# Patient Record
Sex: Male | Born: 1945
Health system: Southern US, Community
[De-identification: ages and names within clinical notes are randomized; demographics above are authoritative.]

## PROBLEM LIST (undated history)

## (undated) DIAGNOSIS — M797 Fibromyalgia: Secondary | ICD-10-CM

## (undated) DIAGNOSIS — G252 Other specified forms of tremor: Secondary | ICD-10-CM

## (undated) DIAGNOSIS — E291 Testicular hypofunction: Secondary | ICD-10-CM

## (undated) DIAGNOSIS — R55 Syncope and collapse: Secondary | ICD-10-CM

## (undated) DIAGNOSIS — M545 Low back pain, unspecified: Secondary | ICD-10-CM

## (undated) DIAGNOSIS — R269 Unspecified abnormalities of gait and mobility: Secondary | ICD-10-CM

## (undated) DIAGNOSIS — K219 Gastro-esophageal reflux disease without esophagitis: Secondary | ICD-10-CM

## (undated) DIAGNOSIS — F419 Anxiety disorder, unspecified: Secondary | ICD-10-CM

## (undated) DIAGNOSIS — G4730 Sleep apnea, unspecified: Secondary | ICD-10-CM

## (undated) DIAGNOSIS — E785 Hyperlipidemia, unspecified: Secondary | ICD-10-CM

## (undated) DIAGNOSIS — R350 Frequency of micturition: Secondary | ICD-10-CM

## (undated) DIAGNOSIS — G20 Parkinson's disease: Secondary | ICD-10-CM

## (undated) DIAGNOSIS — G4710 Hypersomnia, unspecified: Secondary | ICD-10-CM

## (undated) DIAGNOSIS — IMO0002 Reserved for concepts with insufficient information to code with codable children: Secondary | ICD-10-CM

## (undated) DIAGNOSIS — K2990 Gastroduodenitis, unspecified, without bleeding: Secondary | ICD-10-CM

## (undated) DIAGNOSIS — R351 Nocturia: Secondary | ICD-10-CM

## (undated) DIAGNOSIS — M25029 Hemarthrosis, unspecified elbow: Secondary | ICD-10-CM

## (undated) DIAGNOSIS — I10 Essential (primary) hypertension: Secondary | ICD-10-CM

## (undated) DIAGNOSIS — Z800 Family history of malignant neoplasm of digestive organs: Secondary | ICD-10-CM

## (undated) DIAGNOSIS — K2970 Gastritis, unspecified, without bleeding: Secondary | ICD-10-CM

## (undated) DIAGNOSIS — R1031 Right lower quadrant pain: Secondary | ICD-10-CM

## (undated) DIAGNOSIS — M810 Age-related osteoporosis without current pathological fracture: Secondary | ICD-10-CM

## (undated) DIAGNOSIS — M48061 Spinal stenosis, lumbar region without neurogenic claudication: Secondary | ICD-10-CM

## (undated) DIAGNOSIS — K4090 Unilateral inguinal hernia, without obstruction or gangrene, not specified as recurrent: Secondary | ICD-10-CM

## (undated) DIAGNOSIS — F32A Depression, unspecified: Secondary | ICD-10-CM

## (undated) DIAGNOSIS — T887XXA Unspecified adverse effect of drug or medicament, initial encounter: Secondary | ICD-10-CM

## (undated) DIAGNOSIS — E876 Hypokalemia: Secondary | ICD-10-CM

## (undated) DIAGNOSIS — R413 Other amnesia: Secondary | ICD-10-CM

## (undated) DIAGNOSIS — F329 Major depressive disorder, single episode, unspecified: Secondary | ICD-10-CM

## (undated) DIAGNOSIS — K5730 Diverticulosis of large intestine without perforation or abscess without bleeding: Secondary | ICD-10-CM

## (undated) DIAGNOSIS — M1710 Unilateral primary osteoarthritis, unspecified knee: Secondary | ICD-10-CM

## (undated) DIAGNOSIS — G250 Essential tremor: Secondary | ICD-10-CM

## (undated) DIAGNOSIS — N410 Acute prostatitis: Secondary | ICD-10-CM

## (undated) HISTORY — DX: Low back pain: M54.5

## (undated) HISTORY — DX: Frequency of micturition: R35.0

## (undated) HISTORY — DX: Hypersomnia, unspecified: G47.10

## (undated) HISTORY — DX: Family history of malignant neoplasm of digestive organs: Z80.0

## (undated) HISTORY — DX: Low back pain, unspecified: M54.50

## (undated) HISTORY — DX: Other amnesia: R41.3

## (undated) HISTORY — DX: Essential tremor: G25.0

## (undated) HISTORY — DX: Hemarthrosis, unspecified elbow: M25.029

## (undated) HISTORY — DX: Essential tremor: G25.2

## (undated) HISTORY — PX: HERNIA REPAIR: SHX51

## (undated) HISTORY — PX: TONSILLECTOMY: SUR1361

## (undated) HISTORY — DX: Fibromyalgia: M79.7

## (undated) HISTORY — DX: Reserved for concepts with insufficient information to code with codable children: IMO0002

## (undated) HISTORY — DX: Syncope and collapse: R55

## (undated) HISTORY — DX: Spinal stenosis, lumbar region without neurogenic claudication: M48.061

## (undated) HISTORY — DX: Sleep apnea, unspecified: G47.30

## (undated) HISTORY — PX: BACK SURGERY: SHX140

## (undated) HISTORY — DX: Gastritis, unspecified, without bleeding: K29.70

## (undated) HISTORY — DX: Unspecified abnormalities of gait and mobility: R26.9

## (undated) HISTORY — DX: Hyperlipidemia, unspecified: E78.5

## (undated) HISTORY — DX: Gastroduodenitis, unspecified, without bleeding: K29.90

## (undated) HISTORY — DX: Testicular hypofunction: E29.1

## (undated) HISTORY — DX: Age-related osteoporosis without current pathological fracture: M81.0

## (undated) HISTORY — DX: Right lower quadrant pain: R10.31

## (undated) HISTORY — PX: OTHER SURGICAL HISTORY: SHX169

## (undated) HISTORY — DX: Essential (primary) hypertension: I10

## (undated) HISTORY — DX: Gastro-esophageal reflux disease without esophagitis: K21.9

## (undated) HISTORY — DX: Hypokalemia: E87.6

## (undated) HISTORY — DX: Parkinson's disease: G20

## (undated) HISTORY — DX: Unilateral inguinal hernia, without obstruction or gangrene, not specified as recurrent: K40.90

## (undated) HISTORY — DX: Unspecified adverse effect of drug or medicament, initial encounter: T88.7XXA

## (undated) HISTORY — DX: Nocturia: R35.1

## (undated) HISTORY — DX: Diverticulosis of large intestine without perforation or abscess without bleeding: K57.30

## (undated) HISTORY — DX: Acute prostatitis: N41.0

## (undated) HISTORY — DX: Unilateral primary osteoarthritis, unspecified knee: M17.10

---

## 2000-10-29 ENCOUNTER — Encounter: Payer: Self-pay | Admitting: Urology

## 2000-10-29 ENCOUNTER — Encounter: Admission: RE | Admit: 2000-10-29 | Discharge: 2000-10-29 | Payer: Self-pay | Admitting: Urology

## 2000-11-06 ENCOUNTER — Encounter: Payer: Self-pay | Admitting: Urology

## 2000-11-07 ENCOUNTER — Encounter (INDEPENDENT_AMBULATORY_CARE_PROVIDER_SITE_OTHER): Payer: Self-pay | Admitting: Specialist

## 2000-11-07 ENCOUNTER — Ambulatory Visit (HOSPITAL_COMMUNITY): Admission: RE | Admit: 2000-11-07 | Discharge: 2000-11-07 | Payer: Self-pay | Admitting: Urology

## 2001-03-15 ENCOUNTER — Encounter: Payer: Self-pay | Admitting: Emergency Medicine

## 2001-03-16 ENCOUNTER — Inpatient Hospital Stay (HOSPITAL_COMMUNITY): Admission: EM | Admit: 2001-03-16 | Discharge: 2001-03-16 | Payer: Self-pay | Admitting: Emergency Medicine

## 2002-04-30 ENCOUNTER — Encounter: Payer: Self-pay | Admitting: Internal Medicine

## 2002-04-30 ENCOUNTER — Encounter: Admission: RE | Admit: 2002-04-30 | Discharge: 2002-04-30 | Payer: Self-pay | Admitting: Internal Medicine

## 2003-02-25 ENCOUNTER — Encounter: Payer: Self-pay | Admitting: Orthopaedic Surgery

## 2003-03-02 ENCOUNTER — Ambulatory Visit (HOSPITAL_COMMUNITY): Admission: RE | Admit: 2003-03-02 | Discharge: 2003-03-03 | Payer: Self-pay | Admitting: Orthopaedic Surgery

## 2003-03-02 ENCOUNTER — Encounter: Payer: Self-pay | Admitting: Orthopaedic Surgery

## 2003-06-29 ENCOUNTER — Ambulatory Visit (HOSPITAL_COMMUNITY): Admission: RE | Admit: 2003-06-29 | Discharge: 2003-06-29 | Payer: Self-pay | Admitting: Orthopaedic Surgery

## 2005-04-18 ENCOUNTER — Ambulatory Visit: Payer: Self-pay | Admitting: Internal Medicine

## 2005-04-25 ENCOUNTER — Ambulatory Visit: Payer: Self-pay | Admitting: Internal Medicine

## 2005-04-26 ENCOUNTER — Ambulatory Visit: Payer: Self-pay | Admitting: Internal Medicine

## 2005-06-27 ENCOUNTER — Ambulatory Visit: Payer: Self-pay | Admitting: Internal Medicine

## 2005-06-28 ENCOUNTER — Encounter: Admission: RE | Admit: 2005-06-28 | Discharge: 2005-06-28 | Payer: Self-pay | Admitting: Internal Medicine

## 2005-08-02 ENCOUNTER — Encounter: Admission: RE | Admit: 2005-08-02 | Discharge: 2005-08-02 | Payer: Self-pay | Admitting: Specialist

## 2005-11-18 ENCOUNTER — Ambulatory Visit: Payer: Self-pay | Admitting: Internal Medicine

## 2005-12-09 ENCOUNTER — Ambulatory Visit: Payer: Self-pay | Admitting: Internal Medicine

## 2006-02-25 ENCOUNTER — Ambulatory Visit: Payer: Self-pay | Admitting: Internal Medicine

## 2006-03-23 ENCOUNTER — Encounter: Admission: RE | Admit: 2006-03-23 | Discharge: 2006-03-23 | Payer: Self-pay | Admitting: Specialist

## 2006-04-03 ENCOUNTER — Ambulatory Visit: Payer: Self-pay | Admitting: Internal Medicine

## 2006-04-17 ENCOUNTER — Ambulatory Visit: Payer: Self-pay | Admitting: Internal Medicine

## 2006-05-16 ENCOUNTER — Ambulatory Visit: Payer: Self-pay | Admitting: Internal Medicine

## 2006-05-21 ENCOUNTER — Ambulatory Visit: Payer: Self-pay | Admitting: Internal Medicine

## 2006-07-21 ENCOUNTER — Ambulatory Visit: Payer: Self-pay | Admitting: Internal Medicine

## 2006-08-21 ENCOUNTER — Ambulatory Visit: Payer: Self-pay | Admitting: Internal Medicine

## 2006-10-23 ENCOUNTER — Ambulatory Visit: Payer: Self-pay | Admitting: Internal Medicine

## 2006-12-25 ENCOUNTER — Ambulatory Visit: Payer: Self-pay | Admitting: Internal Medicine

## 2007-02-09 ENCOUNTER — Ambulatory Visit: Payer: Self-pay | Admitting: Gastroenterology

## 2007-02-12 ENCOUNTER — Ambulatory Visit: Payer: Self-pay | Admitting: Gastroenterology

## 2007-02-12 DIAGNOSIS — K2990 Gastroduodenitis, unspecified, without bleeding: Secondary | ICD-10-CM

## 2007-02-12 DIAGNOSIS — K219 Gastro-esophageal reflux disease without esophagitis: Secondary | ICD-10-CM

## 2007-02-12 DIAGNOSIS — I10 Essential (primary) hypertension: Secondary | ICD-10-CM

## 2007-02-12 DIAGNOSIS — K2970 Gastritis, unspecified, without bleeding: Secondary | ICD-10-CM | POA: Insufficient documentation

## 2007-02-13 ENCOUNTER — Encounter: Payer: Self-pay | Admitting: Gastroenterology

## 2007-02-13 ENCOUNTER — Ambulatory Visit: Payer: Self-pay | Admitting: Gastroenterology

## 2007-02-23 ENCOUNTER — Ambulatory Visit: Payer: Self-pay | Admitting: Gastroenterology

## 2007-02-23 DIAGNOSIS — K5730 Diverticulosis of large intestine without perforation or abscess without bleeding: Secondary | ICD-10-CM | POA: Insufficient documentation

## 2007-03-03 ENCOUNTER — Ambulatory Visit: Payer: Self-pay | Admitting: Internal Medicine

## 2007-03-30 ENCOUNTER — Ambulatory Visit: Payer: Self-pay | Admitting: Gastroenterology

## 2007-05-11 ENCOUNTER — Ambulatory Visit: Payer: Self-pay | Admitting: Internal Medicine

## 2007-05-11 LAB — CONVERTED CEMR LAB
ALT: 21 units/L (ref 0–53)
AST: 21 units/L (ref 0–37)
BUN: 16 mg/dL (ref 6–23)
Basophils Absolute: 0 10*3/uL (ref 0.0–0.1)
Bilirubin, Direct: 0.1 mg/dL (ref 0.0–0.3)
CO2: 30 meq/L (ref 19–32)
Chloride: 104 meq/L (ref 96–112)
Cholesterol: 245 mg/dL (ref 0–200)
Creatinine, Ser: 0.9 mg/dL (ref 0.4–1.5)
Direct LDL: 149.8 mg/dL
Eosinophils Relative: 2.7 % (ref 0.0–5.0)
GFR calc Af Amer: 110 mL/min
Glucose, Bld: 93 mg/dL (ref 70–99)
HCT: 38.7 % — ABNORMAL LOW (ref 39.0–52.0)
Ketones, urine, test strip: NEGATIVE
Lymphocytes Relative: 31.8 % (ref 12.0–46.0)
MCV: 93.7 fL (ref 78.0–100.0)
Neutro Abs: 3.2 10*3/uL (ref 1.4–7.7)
Platelets: 195 10*3/uL (ref 150–400)
RDW: 12.4 % (ref 11.5–14.6)
Specific Gravity, Urine: 1.02
Total CHOL/HDL Ratio: 3.3
Urobilinogen, UA: 0.2
VLDL: 18 mg/dL (ref 0–40)

## 2007-05-12 ENCOUNTER — Ambulatory Visit: Payer: Self-pay | Admitting: Internal Medicine

## 2007-05-12 ENCOUNTER — Encounter: Payer: Self-pay | Admitting: Internal Medicine

## 2007-05-15 ENCOUNTER — Ambulatory Visit: Payer: Self-pay | Admitting: Internal Medicine

## 2007-05-15 DIAGNOSIS — M545 Low back pain, unspecified: Secondary | ICD-10-CM | POA: Insufficient documentation

## 2007-05-15 DIAGNOSIS — E785 Hyperlipidemia, unspecified: Secondary | ICD-10-CM

## 2007-05-15 DIAGNOSIS — M810 Age-related osteoporosis without current pathological fracture: Secondary | ICD-10-CM

## 2007-06-09 ENCOUNTER — Ambulatory Visit: Payer: Self-pay | Admitting: Psychology

## 2007-07-03 ENCOUNTER — Ambulatory Visit: Payer: Self-pay | Admitting: Psychology

## 2007-07-22 ENCOUNTER — Ambulatory Visit: Payer: Self-pay | Admitting: *Deleted

## 2007-07-29 ENCOUNTER — Ambulatory Visit: Payer: Self-pay | Admitting: *Deleted

## 2007-08-05 ENCOUNTER — Ambulatory Visit: Payer: Self-pay | Admitting: *Deleted

## 2007-08-19 ENCOUNTER — Ambulatory Visit: Payer: Self-pay | Admitting: *Deleted

## 2007-09-29 ENCOUNTER — Ambulatory Visit: Payer: Self-pay | Admitting: Internal Medicine

## 2007-09-29 LAB — CONVERTED CEMR LAB
ALT: 27 units/L (ref 0–53)
AST: 24 units/L (ref 0–37)
Alkaline Phosphatase: 57 units/L (ref 39–117)
Bilirubin, Direct: 0.2 mg/dL (ref 0.0–0.3)
Direct LDL: 165.8 mg/dL
HDL: 75.3 mg/dL (ref >39.0)
Total Protein: 7 g/dL (ref 6.0–8.3)
Triglycerides: 79 mg/dL (ref 0–149)

## 2007-10-05 ENCOUNTER — Ambulatory Visit: Payer: Self-pay | Admitting: Internal Medicine

## 2007-10-05 LAB — CONVERTED CEMR LAB: LDL Goal: 160 mg/dL

## 2007-11-23 DIAGNOSIS — K5909 Other constipation: Secondary | ICD-10-CM | POA: Insufficient documentation

## 2007-11-26 ENCOUNTER — Ambulatory Visit: Payer: Self-pay | Admitting: Internal Medicine

## 2007-12-03 ENCOUNTER — Ambulatory Visit: Payer: Self-pay | Admitting: Internal Medicine

## 2008-02-26 ENCOUNTER — Ambulatory Visit: Payer: Self-pay | Admitting: Internal Medicine

## 2008-02-26 DIAGNOSIS — T887XXA Unspecified adverse effect of drug or medicament, initial encounter: Secondary | ICD-10-CM

## 2008-02-26 LAB — CONVERTED CEMR LAB
AST: 21 units/L (ref 0–37)
Albumin: 4 g/dL (ref 3.5–5.2)
Bilirubin, Direct: 0.1 mg/dL (ref 0.0–0.3)
CO2: 33 meq/L — ABNORMAL HIGH (ref 19–32)
Chloride: 103 meq/L (ref 96–112)
GFR calc Af Amer: 97 mL/min
GFR calc non Af Amer: 80 mL/min
Glucose, Bld: 84 mg/dL (ref 70–99)
Potassium: 3.6 meq/L (ref 3.5–5.1)
Sodium: 142 meq/L (ref 135–145)
Total Protein: 6.8 g/dL (ref 6.0–8.3)

## 2008-03-01 LAB — CONVERTED CEMR LAB: Vit D, 1,25-Dihydroxy: 24 — ABNORMAL LOW (ref 30–89)

## 2008-03-08 ENCOUNTER — Ambulatory Visit (HOSPITAL_COMMUNITY): Admission: RE | Admit: 2008-03-08 | Discharge: 2008-03-08 | Payer: Self-pay | Admitting: Anesthesiology

## 2008-04-17 ENCOUNTER — Emergency Department (HOSPITAL_COMMUNITY): Admission: EM | Admit: 2008-04-17 | Discharge: 2008-04-17 | Payer: Self-pay | Admitting: Emergency Medicine

## 2008-04-19 ENCOUNTER — Ambulatory Visit: Payer: Self-pay | Admitting: Internal Medicine

## 2008-04-19 DIAGNOSIS — E876 Hypokalemia: Secondary | ICD-10-CM | POA: Insufficient documentation

## 2008-04-19 DIAGNOSIS — R55 Syncope and collapse: Secondary | ICD-10-CM | POA: Insufficient documentation

## 2008-04-19 LAB — CONVERTED CEMR LAB
Basophils Absolute: 0 10*3/uL (ref 0.0–0.1)
Calcium: 9 mg/dL (ref 8.4–10.5)
Chloride: 108 meq/L (ref 96–112)
Creatinine, Ser: 0.9 mg/dL (ref 0.4–1.5)
Eosinophils Relative: 1.6 % (ref 0.0–5.0)
GFR calc non Af Amer: 91 mL/min
Glucose, Bld: 89 mg/dL (ref 70–99)
Platelets: 216 10*3/uL (ref 150–400)
Potassium: 4.2 meq/L (ref 3.5–5.1)
RDW: 12.6 % (ref 11.5–14.6)
Total CK: 56 units/L (ref 7–195)
Vit D, 1,25-Dihydroxy: 31 (ref 30–89)
WBC: 5.1 10*3/uL (ref 4.5–10.5)

## 2008-04-26 ENCOUNTER — Telehealth: Payer: Self-pay | Admitting: Internal Medicine

## 2008-04-28 ENCOUNTER — Ambulatory Visit: Payer: Self-pay | Admitting: Internal Medicine

## 2008-05-09 ENCOUNTER — Telehealth: Payer: Self-pay | Admitting: Internal Medicine

## 2008-05-09 ENCOUNTER — Ambulatory Visit: Payer: Self-pay | Admitting: Internal Medicine

## 2008-07-01 ENCOUNTER — Ambulatory Visit: Payer: Self-pay | Admitting: Internal Medicine

## 2008-07-01 DIAGNOSIS — E291 Testicular hypofunction: Secondary | ICD-10-CM

## 2008-08-10 ENCOUNTER — Encounter: Payer: Self-pay | Admitting: Internal Medicine

## 2008-09-07 ENCOUNTER — Ambulatory Visit: Payer: Self-pay | Admitting: Internal Medicine

## 2008-09-07 DIAGNOSIS — M818 Other osteoporosis without current pathological fracture: Secondary | ICD-10-CM

## 2008-09-29 ENCOUNTER — Telehealth: Payer: Self-pay | Admitting: Internal Medicine

## 2008-10-07 ENCOUNTER — Telehealth: Payer: Self-pay | Admitting: Internal Medicine

## 2008-10-07 ENCOUNTER — Encounter: Payer: Self-pay | Admitting: Internal Medicine

## 2008-11-09 ENCOUNTER — Ambulatory Visit: Payer: Self-pay | Admitting: Internal Medicine

## 2009-02-03 ENCOUNTER — Ambulatory Visit: Payer: Self-pay | Admitting: Internal Medicine

## 2009-02-03 LAB — CONVERTED CEMR LAB
ALT: 18 units/L (ref 0–53)
AST: 19 units/L (ref 0–37)
Albumin: 3.9 g/dL (ref 3.5–5.2)
Bilirubin, Direct: 0.1 mg/dL (ref 0.0–0.3)
CO2: 32 meq/L (ref 19–32)
Calcium: 8.4 mg/dL (ref 8.4–10.5)
Chloride: 104 meq/L (ref 96–112)
GFR calc non Af Amer: 79.95 mL/min (ref >60)
Glucose, Bld: 95 mg/dL (ref 70–99)
HDL: 59.7 mg/dL (ref >39.00)
Sodium: 143 meq/L (ref 135–145)
TSH: 2.64 microintl units/mL (ref 0.35–5.50)
Total Bilirubin: 0.8 mg/dL (ref 0.3–1.2)
Total Protein: 6.7 g/dL (ref 6.0–8.3)

## 2009-02-09 ENCOUNTER — Ambulatory Visit: Payer: Self-pay | Admitting: Internal Medicine

## 2009-02-09 DIAGNOSIS — R350 Frequency of micturition: Secondary | ICD-10-CM

## 2009-03-28 ENCOUNTER — Encounter: Payer: Self-pay | Admitting: Internal Medicine

## 2009-05-16 ENCOUNTER — Ambulatory Visit: Payer: Self-pay | Admitting: Internal Medicine

## 2009-05-16 DIAGNOSIS — M1710 Unilateral primary osteoarthritis, unspecified knee: Secondary | ICD-10-CM | POA: Insufficient documentation

## 2009-05-16 DIAGNOSIS — IMO0001 Reserved for inherently not codable concepts without codable children: Secondary | ICD-10-CM

## 2009-06-07 ENCOUNTER — Ambulatory Visit: Payer: Self-pay | Admitting: Internal Medicine

## 2009-06-07 DIAGNOSIS — M48061 Spinal stenosis, lumbar region without neurogenic claudication: Secondary | ICD-10-CM

## 2009-06-09 ENCOUNTER — Telehealth: Payer: Self-pay | Admitting: Internal Medicine

## 2009-06-12 ENCOUNTER — Encounter: Admission: RE | Admit: 2009-06-12 | Discharge: 2009-06-12 | Payer: Self-pay | Admitting: Internal Medicine

## 2009-06-14 ENCOUNTER — Encounter: Payer: Self-pay | Admitting: Internal Medicine

## 2009-06-20 ENCOUNTER — Encounter: Admission: RE | Admit: 2009-06-20 | Discharge: 2009-08-14 | Payer: Self-pay | Admitting: Internal Medicine

## 2009-06-26 ENCOUNTER — Telehealth: Payer: Self-pay | Admitting: Internal Medicine

## 2009-07-17 ENCOUNTER — Ambulatory Visit: Payer: Self-pay | Admitting: Internal Medicine

## 2009-08-14 ENCOUNTER — Encounter: Payer: Self-pay | Admitting: Internal Medicine

## 2009-08-14 ENCOUNTER — Ambulatory Visit: Payer: Self-pay | Admitting: Internal Medicine

## 2009-09-14 ENCOUNTER — Ambulatory Visit: Payer: Self-pay | Admitting: Internal Medicine

## 2009-09-14 LAB — CONVERTED CEMR LAB
CRP, High Sensitivity: 0.7 (ref 0.00–5.00)
Chloride: 103 meq/L (ref 96–112)
Creatinine, Ser: 1 mg/dL (ref 0.4–1.5)

## 2009-09-18 LAB — CONVERTED CEMR LAB
Calcium, Total (PTH): 8.9 mg/dL (ref 8.4–10.5)
PTH: 39.9 pg/mL (ref 14.0–72.0)

## 2009-10-27 ENCOUNTER — Ambulatory Visit: Payer: Self-pay | Admitting: Internal Medicine

## 2009-10-30 ENCOUNTER — Encounter: Payer: Self-pay | Admitting: Internal Medicine

## 2009-11-01 ENCOUNTER — Ambulatory Visit: Payer: Self-pay | Admitting: Internal Medicine

## 2009-11-01 LAB — CONVERTED CEMR LAB
Calcium: 8.7 mg/dL (ref 8.4–10.5)
Creatinine, Ser: 1.2 mg/dL (ref 0.4–1.5)

## 2009-11-03 ENCOUNTER — Encounter: Admission: RE | Admit: 2009-11-03 | Discharge: 2009-11-03 | Payer: Self-pay | Admitting: Internal Medicine

## 2009-11-09 ENCOUNTER — Ambulatory Visit (HOSPITAL_COMMUNITY): Admission: RE | Admit: 2009-11-09 | Discharge: 2009-11-09 | Payer: Self-pay | Admitting: Neurosurgery

## 2009-11-22 ENCOUNTER — Ambulatory Visit: Payer: Self-pay | Admitting: Internal Medicine

## 2009-11-22 DIAGNOSIS — G252 Other specified forms of tremor: Secondary | ICD-10-CM

## 2009-11-22 DIAGNOSIS — G250 Essential tremor: Secondary | ICD-10-CM

## 2009-11-28 ENCOUNTER — Encounter: Admission: RE | Admit: 2009-11-28 | Discharge: 2009-11-28 | Payer: Self-pay | Admitting: Internal Medicine

## 2009-12-14 ENCOUNTER — Encounter: Admission: RE | Admit: 2009-12-14 | Discharge: 2009-12-14 | Payer: Self-pay | Admitting: Neurosurgery

## 2010-01-23 ENCOUNTER — Ambulatory Visit: Payer: Self-pay | Admitting: Internal Medicine

## 2010-01-23 LAB — CONVERTED CEMR LAB
BUN: 21 mg/dL (ref 6–23)
Creatinine, Ser: 1 mg/dL (ref 0.4–1.5)
Glucose, Bld: 87 mg/dL (ref 70–99)
Hgb A1c MFr Bld: 5.9 % (ref 4.6–6.5)
Vit D, 25-Hydroxy: 77 ng/mL (ref 30–89)

## 2010-03-20 ENCOUNTER — Ambulatory Visit: Payer: Self-pay | Admitting: Gastroenterology

## 2010-03-20 DIAGNOSIS — R1031 Right lower quadrant pain: Secondary | ICD-10-CM

## 2010-03-26 ENCOUNTER — Ambulatory Visit: Payer: Self-pay | Admitting: Internal Medicine

## 2010-03-26 ENCOUNTER — Telehealth: Payer: Self-pay | Admitting: Internal Medicine

## 2010-03-26 DIAGNOSIS — G4710 Hypersomnia, unspecified: Secondary | ICD-10-CM | POA: Insufficient documentation

## 2010-03-26 DIAGNOSIS — G4730 Sleep apnea, unspecified: Secondary | ICD-10-CM

## 2010-04-08 ENCOUNTER — Encounter: Payer: Self-pay | Admitting: Internal Medicine

## 2010-04-08 ENCOUNTER — Ambulatory Visit (HOSPITAL_BASED_OUTPATIENT_CLINIC_OR_DEPARTMENT_OTHER): Admission: RE | Admit: 2010-04-08 | Discharge: 2010-04-08 | Payer: Self-pay | Admitting: Internal Medicine

## 2010-04-27 ENCOUNTER — Ambulatory Visit: Payer: Self-pay | Admitting: Pulmonary Disease

## 2010-05-21 ENCOUNTER — Ambulatory Visit: Payer: Self-pay | Admitting: Pulmonary Disease

## 2010-05-21 ENCOUNTER — Ambulatory Visit: Payer: Self-pay | Admitting: Internal Medicine

## 2010-05-21 DIAGNOSIS — G4731 Primary central sleep apnea: Secondary | ICD-10-CM

## 2010-05-21 DIAGNOSIS — R351 Nocturia: Secondary | ICD-10-CM

## 2010-05-31 ENCOUNTER — Telehealth: Payer: Self-pay | Admitting: Gastroenterology

## 2010-06-24 ENCOUNTER — Ambulatory Visit (HOSPITAL_BASED_OUTPATIENT_CLINIC_OR_DEPARTMENT_OTHER)
Admission: RE | Admit: 2010-06-24 | Discharge: 2010-06-24 | Payer: Self-pay | Source: Home / Self Care | Admitting: Pulmonary Disease

## 2010-06-24 ENCOUNTER — Encounter: Payer: Self-pay | Admitting: Pulmonary Disease

## 2010-06-27 ENCOUNTER — Ambulatory Visit: Payer: Self-pay | Admitting: Internal Medicine

## 2010-07-11 ENCOUNTER — Ambulatory Visit: Payer: Self-pay | Admitting: Pulmonary Disease

## 2010-07-19 ENCOUNTER — Ambulatory Visit: Payer: Self-pay | Admitting: Pulmonary Disease

## 2010-07-30 ENCOUNTER — Encounter: Payer: Self-pay | Admitting: Pulmonary Disease

## 2010-08-31 ENCOUNTER — Ambulatory Visit
Admission: RE | Admit: 2010-08-31 | Discharge: 2010-08-31 | Payer: Self-pay | Source: Home / Self Care | Attending: Internal Medicine | Admitting: Internal Medicine

## 2010-09-20 NOTE — Assessment & Plan Note (Signed)
Summary: 2 month fup//ccm//pt rescd//ccm   Vital Signs:  Patient profile:   65 year old male Height:      69 inches Weight:      151 pounds BMI:     22.38 Temp:     98.2 degrees F oral Pulse rate:   56 / minute Resp:     14 per minute BP sitting:   150 / 84  (left arm)  Vitals Entered By: Willy Eddy, LPN (September 14, 2009 9:10 AM) CC: roa-bone density   CC:  roa-bone density.  History of Present Illness: Increased urinary frequency and nocturia with hx of BPH the pain and chronic fatigue has ncreased with increasd msucle weakness increased abdominla pain in the left loer abd with since of possible hernia increased frustration in overall conditon and frustraction over the pain clinc's inability to control pain  Preventive Screening-Counseling & Management  Alcohol-Tobacco     Smoking Status: never  Problems Prior to Update: 1)  Spinal Stenosis, Lumbar  (ICD-724.02) 2)  Osteoarthritis, Lower Leg, Left  (ICD-715.36) 3)  Myofascial Pain Syndrome  (ICD-729.1) 4)  Urinary Frequency, Chronic  (ICD-788.41) 5)  Disuse Osteoporosis  (ICD-733.03) 6)  Hypogonadism, Male  (ICD-257.2) 7)  Syncope  (ICD-780.2) 8)  Hypokalemia  (ICD-276.8) 9)  Hemarthorsis, Upper Arm  (ICD-719.12) 10)  Uns Advrs Eff Uns Rx Medicinal&biological Sbstnc  (ICD-995.20) 11)  Gastritis  (ICD-535.50) 12)  Diverticulosis, Colon  (ICD-562.10) 13)  Colorectal Cancer, Family Hx  (ICD-V16.0) 14)  Constipation, Chronic  (ICD-564.09) 15)  Osteoporosis  (ICD-733.00) 16)  Low Back Pain  (ICD-724.2) 17)  Hyperlipidemia  (ICD-272.4) 18)  Physical Examination  (ICD-V70.0) 19)  Hypertension  (ICD-401.9) 20)  Gerd  (ICD-530.81)  Medications Prior to Update: 1)  Multivitamin .... Take 1 Tablet By Mouth Once A Day 2)  Metoprolol Tartrate 50 Mg  Tabs (Metoprolol Tartrate) .... Two Times A Day 3)  Lotrel 10-20 Mg  Caps (Amlodipine Besy-Benazepril Hcl) .... Take 1 Tablet By Mouth Once A Day 4)  Omega 3 .... Take  2 Tablet By Mouth Once A Day 5)  Methadone Hcl 5 Mg Tabs (Methadone Hcl) .Marland Kitchen.. 1 Four Times A Day 6)  Celexa 20 Mg  Tabs (Citalopram Hydrobromide) .... Once Daily 7)  Actonel 150 Mg  Tabs (Risedronate Sodium) .... One By Mouth Monthy As Direted. 8)  Vitamin D 21308 Unit  Caps (Ergocalciferol) .... One By Mouth Weekly 9)  Amitiza 24 Mcg Caps (Lubiprostone) .... One By Mouth Daily 10)  Red Yeast Rice 600 Mg Caps (Red Yeast Rice Extract) .... 2 Once Daily 11)  Calcium-Magnesium 750-465 Mg Tabs (Calcium-Magnesium) .... 2 Once Daily 12)  Dhea 25 Mg Tabs (Prasterone (Dhea)) .Marland Kitchen.. 1 Once Daily 13)  Vitamin B-12 50 Mcg Tabs (Cyanocobalamin) .Marland Kitchen.. 1 Once Daily 14)  Vitamin C 1000 Mg Tabs (Ascorbic Acid) .Marland Kitchen.. 1 Once Daily 15)  Medrol 4 Mg Tabs (Methylprednisolone) .... Three For 5 Days, Two For 5 Day , Then 1 For 5 Day 16)  Diazepam 5 Mg Tabs (Diazepam) .... One By Mouth Bid  Current Medications (verified): 1)  Multivitamin .... Take 1 Tablet By Mouth Once A Day 2)  Metoprolol Tartrate 50 Mg  Tabs (Metoprolol Tartrate) .... Two Times A Day 3)  Lotrel 10-20 Mg  Caps (Amlodipine Besy-Benazepril Hcl) .... Take 1 Tablet By Mouth Once A Day 4)  Omega 3 .... Take 2 Tablet By Mouth Once A Day 5)  Methadone Hcl 5 Mg Tabs (Methadone Hcl) .Marland KitchenMarland KitchenMarland Kitchen  1 Four Times A Day 6)  Celexa 20 Mg  Tabs (Citalopram Hydrobromide) .... Once Daily 7)  Actonel 150 Mg  Tabs (Risedronate Sodium) .... One By Mouth Monthy As Direted. 8)  Vitamin D 04540 Unit  Caps (Ergocalciferol) .... One By Mouth Weekly 9)  Amitiza 24 Mcg Caps (Lubiprostone) .... One By Mouth Daily 10)  Red Yeast Rice 600 Mg Caps (Red Yeast Rice Extract) .... 2 Once Daily 11)  Calcium-Magnesium 750-465 Mg Tabs (Calcium-Magnesium) .... 2 Once Daily 12)  Dhea 25 Mg Tabs (Prasterone (Dhea)) .Marland Kitchen.. 1 Once Daily 13)  Vitamin B-12 50 Mcg Tabs (Cyanocobalamin) .Marland Kitchen.. 1 Once Daily 14)  Vitamin C 1000 Mg Tabs (Ascorbic Acid) .Marland Kitchen.. 1 Once Daily 15)  Coq-10 200 Mg Caps (Coenzyme Q10)  .Marland Kitchen.. 1 Once Daily 16)  Align  Caps (Probiotic Product) .Marland Kitchen.. 1 Once Daily  Allergies (verified): 1)  ! Lyrica  Past History:  Family History: Last updated: 02/12/2007 Family hx Colorectal CA  Social History: Last updated: 02/12/2007 Married Drug use-no Regular exercise-no Never Smoked  Risk Factors: Exercise: no (02/12/2007)  Risk Factors: Smoking Status: never (09/14/2009)  Past medical, surgical, family and social histories (including risk factors) reviewed, and no changes noted (except as noted below).  Past Medical History: Reviewed history from 05/15/2007 and no changes required. GERD Hypertension Back pain BPH Hyperlipidemia Low back pain Osteoporosis  Past Surgical History: Reviewed history from 02/12/2007 and no changes required. Colonscopy 11/26/2000 Lumbar sx x 2 -2004 Herniorrhapy -1967  Family History: Reviewed history from 02/12/2007 and no changes required. Family hx Colorectal CA  Social History: Reviewed history from 02/12/2007 and no changes required. Married Drug use-no Regular exercise-no Never Smoked  Review of Systems       The patient complains of weight gain and abdominal pain.    Physical Exam  General:  Well-developed,well-nourished,in no acute distress; alert,appropriate and cooperative throughout examination Head:  normocephalic and atraumatic.   Eyes:  pupils equal and pupils round.   Ears:  R ear normal and L ear normal.   Neck:  supple and full ROM.   Lungs:  normal respiratory effort and no intercostal retractions.   Heart:  normal rate and regular rhythm.   Abdomen:  difuse tenderness in the mid epigastrum normal BS   Rectal:  No external abnormalities noted. Normal sphincter tone. No rectal masses or tenderness. Genitalia:  slight rigfht indirect inginal tenderness Prostate:  no nodules, tender, boggy, and 1+ enlarged.     Impression & Recommendations:  Problem # 1:  OSTEOPOROSIS (ICD-733.00) Assessment  Deteriorated  on actonil for 18 months with worsening bone density  His updated medication list for this problem includes:    Actonel 150 Mg Tabs (Risedronate sodium) ..... One by mouth monthy as direted.  Discussed medication use, applications of heat or ice, and exercises.   Orders: Venipuncture (98119) TLB-Calcium (82310-CA) T-Parathyroid Hormone, Intact w/ Calcium (14782-95621)  Problem # 2:  MYOFASCIAL PAIN SYNDROME (ICD-729.1) Assessment: Deteriorated poor pain control Dr Vear Clock hesitant to go up on methadone moniter for secondary caosues such as paraqthyroid dz, elevated crp, cortisol elevations His updated medication list for this problem includes:    Methadone Hcl 5 Mg Tabs (Methadone hcl) .Marland Kitchen... 1 four times a day  Orders: TLB-CRP-High Sensitivity (C-Reactive Protein) (86140-FCRP) TLB-Cortisol (82533-CORT)  Problem # 3:  LOW BACK PAIN (ICD-724.2) Assessment: Deteriorated  acute protste infection may complicate this His updated medication list for this problem includes:    Methadone Hcl 5 Mg Tabs (Methadone  hcl) ..... 1 four times a day  Orders: Venipuncture (52841) TLB-Calcium (82310-CA) T-Parathyroid Hormone, Intact w/ Calcium (32440-10272)  Discussed use of moist heat or ice, modified activities, medications, and stretching/strengthening exercises. Back care instructions given. To be seen in 2 weeks if no improvement; sooner if worsening of symptoms.   Problem # 4:  ACUTE PROSTATITIS (ICD-601.0) Assessment: New smx ds two times a day for 21 days  Complete Medication List: 1)  Multivitamin  .... Take 1 tablet by mouth once a day 2)  Metoprolol Tartrate 50 Mg Tabs (Metoprolol tartrate) .... Two times a day 3)  Lotrel 10-20 Mg Caps (Amlodipine besy-benazepril hcl) .... Take 1 tablet by mouth once a day 4)  Omega 3  .... Take 2 tablet by mouth once a day 5)  Methadone Hcl 5 Mg Tabs (Methadone hcl) .Marland Kitchen.. 1 four times a day 6)  Celexa 20 Mg Tabs (Citalopram  hydrobromide) .... Once daily 7)  Actonel 150 Mg Tabs (Risedronate sodium) .... One by mouth monthy as direted. 8)  Vitamin D 53664 Unit Caps (Ergocalciferol) .... One by mouth weekly 9)  Amitiza 24 Mcg Caps (Lubiprostone) .... One by mouth daily 10)  Red Yeast Rice 600 Mg Caps (Red yeast rice extract) .... 2 once daily 11)  Calcium-magnesium 750-465 Mg Tabs (Calcium-magnesium) .... 2 once daily 12)  Dhea 25 Mg Tabs (Prasterone (dhea)) .Marland Kitchen.. 1 once daily 13)  Vitamin B-12 50 Mcg Tabs (Cyanocobalamin) .Marland Kitchen.. 1 once daily 14)  Vitamin C 1000 Mg Tabs (Ascorbic acid) .Marland Kitchen.. 1 once daily 15)  Coq-10 200 Mg Caps (Coenzyme q10) .Marland Kitchen.. 1 once daily 16)  Align Caps (Probiotic product) .Marland Kitchen.. 1 once daily 17)  Septra Ds 800-160 Mg Tabs (Sulfamethoxazole-trimethoprim) .... Generic one by mouth two times a day for 21 days  Other Orders: TLB-PSA (Prostate Specific Antigen) (84153-PSA) TLB-BMP (Basic Metabolic Panel-BMET) (80048-METABOL)  Patient Instructions: 1)  Please schedule a follow-up appointment in 6weeks. Prescriptions: SEPTRA DS 800-160 MG TABS (SULFAMETHOXAZOLE-TRIMETHOPRIM) generic one by mouth two times a day for 21 days  #42 x 0   Entered and Authorized by:   Stacie Glaze MD   Signed by:   Stacie Glaze MD on 09/14/2009   Method used:   Electronically to        [id number] (retail)       [address]       [place], [place]  [address]       Ph: [phone] or [phone]       Fax: [phone]   RxID:   [id number]    Preventive Care Screening  Colonoscopy:    Date:  02/23/2007    Next Due:  02/2012    Results:  abnormal

## 2010-09-20 NOTE — Progress Notes (Signed)
Summary: ? about meds.  Phone Note Call from Patient   Caller: Patient Call For: Stacie Glaze MD Summary of Call: Concerned about stopping the Metoprolol suddenly and starting Bystolic.  Advised him that we will run it past Dr. Lovell Sheehan as they are both Beta blockers. Initial call taken by: Lynann Beaver CMA,  March 26, 2010 2:17 PM  Follow-up for Phone Call        both beta blockers  Pt was aware. Lynann Beaver [facility]  March 26, 2010 2:50 PM  Follow-up by: Willy Eddy, LPN,  March 26, 2010 2:32 PM     Appended Document: Orders Update     Clinical Lists Changes  Orders: Added new Referral order of Pulmonary Referral (Pulmonary) - Signed

## 2010-09-20 NOTE — Assessment & Plan Note (Signed)
Summary: 2 month fup//ccm   Vital Signs:  Patient profile:   65 year old male Weight:      145 pounds Temp:     99 degrees F oral Pulse rate:   60 / minute Pulse rhythm:   regular Resp:     16 per minute BP sitting:   142 / 64  Vitals Entered By: Lynann Beaver CMA (May 21, 2010 10:03 AM) CC: rov Is Patient Diabetic? No Pain Assessment Patient in pain? no        Primary Care Provider:  Darryll Capers, MD   CC:  rov.  History of Present Illness: has a pulmonary visit scheduled has indications of central sleep apnea on sleep study these were reviewed with this pt he has been in a "spiral" acording to his wife his wife relates that to the "actonil" he has been dealing with pain for 6 years the methadone does not control his pain and Vear Clock ahs radio frequency ablations the opiods were difficuult to deal the diagnosis of fibromyalgia was discussed I have spent 45 min with thei pt and his wife I have spent greater that 45 min face to face evaluating this patient and over 1/2 of this time was in councilling   Preventive Screening-Counseling & Management  Alcohol-Tobacco     Smoking Status: never     Tobacco Counseling: not indicated; no tobacco use  Problems Prior to Update: 1)  Hypersomnia, Associated With Sleep Apnea  (ICD-780.53) 2)  Abdominal Pain Right Lower Quadrant  (ICD-789.03) 3)  Tremor, Essential  (ICD-333.1) 4)  Acute Prostatitis  (ICD-601.0) 5)  Spinal Stenosis, Lumbar  (ICD-724.02) 6)  Osteoarthritis, Lower Leg, Left  (ICD-715.36) 7)  Myofascial Pain Syndrome  (ICD-729.1) 8)  Urinary Frequency, Chronic  (ICD-788.41) 9)  Disuse Osteoporosis  (ICD-733.03) 10)  Hypogonadism, Male  (ICD-257.2) 11)  Syncope  (ICD-780.2) 12)  Hypokalemia  (ICD-276.8) 13)  Hemarthorsis, Upper Arm  (ICD-719.12) 14)  Uns Advrs Eff Uns Rx Medicinal&biological Sbstnc  (ICD-995.20) 15)  Gastritis  (ICD-535.50) 16)  Diverticulosis, Colon  (ICD-562.10) 17)  Colorectal  Cancer, Family Hx  (ICD-V16.0) 18)  Constipation, Chronic  (ICD-564.09) 19)  Osteoporosis  (ICD-733.00) 20)  Low Back Pain  (ICD-724.2) 21)  Hyperlipidemia  (ICD-272.4) 22)  Physical Examination  (ICD-V70.0) 23)  Hypertension  (ICD-401.9) 24)  Gerd  (ICD-530.81)  Medications Prior to Update: 1)  Multivitamin .... Take 1 Tablet By Mouth Once A Day 2)  Bystolic 5 Mg Tabs (Nebivolol Hcl) .... One By Mouth Daily 3)  Lotrel 10-20 Mg  Caps (Amlodipine Besy-Benazepril Hcl) .... Take 1 Tablet By Mouth Once A Day 4)  Omega 3 .... Take 2 Tablet By Mouth Once A Day 5)  Methadone Hcl 5 Mg Tabs (Methadone Hcl) .Marland Kitchen.. 1 Four Times A Day 6)  Celexa 20 Mg  Tabs (Citalopram Hydrobromide) .... Once Daily 7)  Red Yeast Rice 600 Mg Caps (Red Yeast Rice Extract) .... 2 Once Daily 8)  Calcium 650-Magnesium 250 .... 2 Once Daily 9)  Vitamin B-12 50 Mcg Tabs (Cyanocobalamin) .Marland Kitchen.. 1 Once Daily 10)  Vitamin C 1000 Mg Tabs (Ascorbic Acid) .Marland Kitchen.. 1 Once Daily 11)  Coq-10 200 Mg Caps (Coenzyme Q10) .Marland Kitchen.. 1 Once Daily 12)  Align  Caps (Probiotic Product) .Marland Kitchen.. 1 Once Daily 13)  Anti-Oxidant  Tabs (Multiple Vitamin) .Marland Kitchen.. 1 Once Daily 14)  Vitamin D-3 5000 Unit Tabs (Cholecalciferol) .... One By Mouth Daily Otc 15)  Vitamin D3 2000 Unit Caps (Cholecalciferol) .... One  By Mouth Daily Otc 16)  Miralax  Powd (Polyethylene Glycol 3350) .... By Mouth Once Daily  Current Medications (verified): 1)  Multivitamin .... Take 1 Tablet By Mouth Once A Day 2)  Omega 3 .... Take 2 Tablet By Mouth Once A Day 3)  Methadone Hcl 5 Mg Tabs (Methadone Hcl) .Marland Kitchen.. 1 Four Times A Day 4)  Savella 50 Mg Tabs (Milnacipran Hcl) .... One By Mouth  Two Times A Day 5)  Red Yeast Rice 600 Mg Caps (Red Yeast Rice Extract) .... 2 Once Daily 6)  Calcium 650-Magnesium 250 .... 2 Once Daily 7)  Vitamin B-12 50 Mcg Tabs (Cyanocobalamin) .Marland Kitchen.. 1 Once Daily 8)  Vitamin C 1000 Mg Tabs (Ascorbic Acid) .Marland Kitchen.. 1 Once Daily 9)  Coq-10 200 Mg Caps (Coenzyme Q10)  .Marland Kitchen.. 1 Once Daily 10)  Align  Caps (Probiotic Product) .Marland Kitchen.. 1 Once Daily 11)  Anti-Oxidant  Tabs (Multiple Vitamin) .Marland Kitchen.. 1 Once Daily 12)  Vitamin D-3 5000 Unit Tabs (Cholecalciferol) .... One By Mouth Daily Otc 13)  Vitamin D3 2000 Unit Caps (Cholecalciferol) .... One By Mouth Daily Otc 14)  Miralax  Powd (Polyethylene Glycol 3350) .... By Mouth Once Daily 15)  Tribenzor 40-5-12.5 Mg Tabs (Olmesartan-Amlodipine-Hctz) .... One By Mouth Daily 16)  Vesicare 5 Mg Tabs (Solifenacin Succinate) .... One By Mouth Q Hs  Allergies (verified): 1)  ! Lyrica  Past History:  Family History: Last updated: 03/20/2010 Family HX Colorectal CA: Mother   Social History: Last updated: 03/20/2010 Retired-Disabled  Married Drug use-no Regular exercise-no Never Smoked  Risk Factors: Exercise: no (02/12/2007)  Risk Factors: Smoking Status: never (05/21/2010)  Past medical, surgical, family and social histories (including risk factors) reviewed, and no changes noted (except as noted below).  Past Medical History: Reviewed history from 05/15/2007 and no changes required. GERD Hypertension Back pain BPH Hyperlipidemia Low back pain Osteoporosis  Past Surgical History: Reviewed history from 02/12/2007 and no changes required. Colonscopy 11/26/2000 Lumbar sx x 2 -2004 Herniorrhapy -1967  Family History: Reviewed history from 03/20/2010 and no changes required. Family HX Colorectal CA: Mother   Social History: Reviewed history from 03/20/2010 and no changes required. Retired-Disabled  Married Drug use-no Regular exercise-no Never Smoked  Review of Systems       The patient complains of weight loss, peripheral edema, muscle weakness, and difficulty walking.  The patient denies anorexia, fever, weight gain, vision loss, decreased hearing, hoarseness, chest pain, syncope, dyspnea on exertion, prolonged cough, headaches, hemoptysis, abdominal pain, melena, hematochezia, severe  indigestion/heartburn, hematuria, incontinence, genital sores, suspicious skin lesions, transient blindness, depression, unusual weight change, abnormal bleeding, enlarged lymph nodes, angioedema, breast masses, and testicular masses.    Physical Exam  General:  alert and pale.   Head:  normocephalic and atraumatic.   Eyes:  pupils equal and pupils round.   Ears:  R ear normal and L ear normal.   Nose:  no external deformity and no nasal discharge.   Neck:  supple and full ROM.   Heart:  normal rate and regular rhythm.   Abdomen:  difuse tenderness in the mid epigastrum normal BS   Msk:  the pts DTR are decreased with some muscvle waistingdecreased ROM, lumbar lordosis, SI joint tenderness, and trigger point tenderness.    score for fibromyagial is high wth all points tender and functioning status impaired progressive deconditoning Pulses:  R and L carotid,radial,femoral,dorsalis pedis and posterior tibial pulses are full and equal bilaterally Extremities:  1+ left pedal edema and 1+  right pedal edema.   Skin:  waist line rash and leg raise Cervical Nodes:  No lymphadenopathy noted Axillary Nodes:  No palpable lymphadenopathy Psych:  Oriented X3 and normally interactive.     Impression & Recommendations:  Problem # 1:  HYPERTENSION (ICD-401.9)  The following medications were removed from the medication list:    Bystolic 5 Mg Tabs (Nebivolol hcl) ..... One by mouth daily    Lotrel 10-20 Mg Caps (Amlodipine besy-benazepril hcl) .Marland Kitchen... Take 1 tablet by mouth once a day His updated medication list for this problem includes:    Tribenzor 40-5-12.5 Mg Tabs (Olmesartan-amlodipine-hctz) ..... One by mouth daily  BP today: 142/64 Prior BP: 140/76 (03/26/2010)  Prior 10 Yr Risk Heart Disease: 18 % (02/09/2009)  Labs Reviewed: K+: 3.5 (01/23/2010) Creat: : 1.0 (01/23/2010)   Chol: 178 (02/03/2009)   HDL: 59.70 (02/03/2009)   LDL: 108 (02/03/2009)   TG: 51.0 (02/03/2009)  Problem #  2:  HYPERSOMNIA, ASSOCIATED WITH SLEEP APNEA (ICD-780.53) central ? methadone provigil vs adderal  Problem # 3:  TREMOR, ESSENTIAL, LEFT HAND AND RIGHT (ICD-333.1) consider enurology after the rhematology this may be more medi realted  Problem # 4:  MYOFASCIAL PAIN SYNDROME (ICD-729.1) needs a rhematologist on board has been on neurontin and lyrica currently on celexa,  ( failed paxil) never on symbalta consider savella  will start trial in 2 weels  His updated medication list for this problem includes:    Methadone Hcl 5 Mg Tabs (Methadone hcl) .Marland Kitchen... 1 four times a day  Problem # 5:  NOCTURIA (ICD-788.43) not daytime but at night has up to 9-10 episodes Dr Earlene Plater saw  him for BPH in the past trial of toviaz  Complete Medication List: 1)  Multivitamin  .... Take 1 tablet by mouth once a day 2)  Omega 3  .... Take 2 tablet by mouth once a day 3)  Methadone Hcl 5 Mg Tabs (Methadone hcl) .Marland Kitchen.. 1 four times a day 4)  Savella 50 Mg Tabs (Milnacipran hcl) .... One by mouth  two times a day 5)  Red Yeast Rice 600 Mg Caps (Red yeast rice extract) .... 2 once daily 6)  Calcium 650-magnesium 250  .... 2 once daily 7)  Vitamin B-12 50 Mcg Tabs (Cyanocobalamin) .Marland Kitchen.. 1 once daily 8)  Vitamin C 1000 Mg Tabs (Ascorbic acid) .Marland Kitchen.. 1 once daily 9)  Coq-10 200 Mg Caps (Coenzyme q10) .Marland Kitchen.. 1 once daily 10)  Align Caps (Probiotic product) .Marland Kitchen.. 1 once daily 11)  Anti-oxidant Tabs (Multiple vitamin) .Marland Kitchen.. 1 once daily 12)  Vitamin D-3 5000 Unit Tabs (Cholecalciferol) .... One by mouth daily otc 13)  Vitamin D3 2000 Unit Caps (Cholecalciferol) .... One by mouth daily otc 14)  Miralax Powd (Polyethylene glycol 3350) .... By mouth once daily 15)  Tribenzor 40-5-12.5 Mg Tabs (Olmesartan-amlodipine-hctz) .... One by mouth daily 16)  Vesicare 5 Mg Tabs (Solifenacin succinate) .... One by mouth q hs  Other Orders: Admin 1st Vaccine (16109) Flu Vaccine 61yrs + (60454) Flu Vaccine Consent Questions     Do  you have a history of severe allergic reactions to this vaccine? no    Any prior history of allergic reactions to egg and/or gelatin? no    Do you have a sensitivity to the preservative Thimersol? no    Do you have a past history of Guillan-Barre Syndrome? no    Do you currently have an acute febrile illness? no    Have you ever had a severe  reaction to latex? no    Vaccine information given and explained to patient? yes    Are you currently pregnant? no    Lot Number:[id number]   Exp Date:02/16/2011   Site Given  Left Deltoid IM Admin 1st Vaccine (57846) Flu Vaccine 74yrs + (96295)  Patient Instructions: 1)  this is the order of  the new drugs 2)  Blood pressure  Tribezor replaces all current blood pressure meds 3)  one a day in the AM 4)  after two weeks replace the celexa with the savella fro the possibility of fibromyalgia 5)  after one week add  vesicare 5 mgbefore before night to stop the bladder 6)  Please schedule a follow-up appointment in 5 weeks.  Marland Kitchenlbflu

## 2010-09-20 NOTE — Medication Information (Signed)
Summary: [employer]cl[employer] Reimburstment Assistance  Reclast Reimburstment Assistance   Imported By: Maryln Gottron 11/07/2009 12:25:08  _____________________________________________________________________  External Attachment:    Type:   Image     Comment:   External Document

## 2010-09-20 NOTE — Assessment & Plan Note (Signed)
Summary: 5 week follow up/cjr   Vital Signs:  Patient profile:   65 year old male Height:      69 inches Weight:      152 pounds BMI:     22.53 Temp:     98.2 degrees F oral Pulse rate:   80 / minute Resp:     14 per minute BP sitting:   140 / 80  (left arm)  Vitals Entered By: Willy Eddy, LPN (June 27, 2010 10:38 AM) CC: roa- f/u starting savella last ov and pt states he doesnt see much difference with it-states new bp med tribenzor is causing higher bp and pulse Is Patient Diabetic? No Pain Assessment Patient in pain? yes        Primary Care Provider:  Darryll Capers, MD   CC:  roa- f/u starting savella last ov and pt states he doesnt see much difference with it-states new bp med tribenzor is causing higher bp and pulse.  History of Present Illness: Dr Shelle Iron did a second apnea with a cpap and the second study is pending the pts wife states he is not snoring and the pt has improved alertness and less afternoon "sleep" episodes Currently on setting 8. results pending New meds no side effect... Back pain and groin pain are the significant problems  Blood  pressure and pulse increased wiht new medications  Preventive Screening-Counseling & Management  Alcohol-Tobacco     Smoking Status: never     Tobacco Counseling: not indicated; no tobacco use  Problems Prior to Update: 1)  Primary Central Sleep Apnea  (ICD-327.21) 2)  Obstructive Sleep Apnea  (ICD-327.23) 3)  Nocturia  (ICD-788.43) 4)  Tremor, Essential, Left Hand and Right  (ICD-333.1) 5)  Hypersomnia, Associated With Sleep Apnea  (ICD-780.53) 6)  Abdominal Pain Right Lower Quadrant  (ICD-789.03) 7)  Tremor, Essential  (ICD-333.1) 8)  Acute Prostatitis  (ICD-601.0) 9)  Spinal Stenosis, Lumbar  (ICD-724.02) 10)  Osteoarthritis, Lower Leg, Left  (ICD-715.36) 11)  Myofascial Pain Syndrome  (ICD-729.1) 12)  Urinary Frequency, Chronic  (ICD-788.41) 13)  Disuse Osteoporosis  (ICD-733.03) 14)   Hypogonadism, Male  (ICD-257.2) 15)  Syncope  (ICD-780.2) 16)  Hypokalemia  (ICD-276.8) 17)  Hemarthorsis, Upper Arm  (ICD-719.12) 18)  Uns Advrs Eff Uns Rx Medicinal&biological Sbstnc  (ICD-995.20) 19)  Gastritis  (ICD-535.50) 20)  Diverticulosis, Colon  (ICD-562.10) 21)  Colorectal Cancer, Family Hx  (ICD-V16.0) 22)  Constipation, Chronic  (ICD-564.09) 23)  Osteoporosis  (ICD-733.00) 24)  Low Back Pain  (ICD-724.2) 25)  Hyperlipidemia  (ICD-272.4) 26)  Physical Examination  (ICD-V70.0) 27)  Hypertension  (ICD-401.9) 28)  Gerd  (ICD-530.81)  Current Problems (verified): 1)  Primary Central Sleep Apnea  (ICD-327.21) 2)  Obstructive Sleep Apnea  (ICD-327.23) 3)  Nocturia  (ICD-788.43) 4)  Tremor, Essential, Left Hand and Right  (ICD-333.1) 5)  Hypersomnia, Associated With Sleep Apnea  (ICD-780.53) 6)  Abdominal Pain Right Lower Quadrant  (ICD-789.03) 7)  Tremor, Essential  (ICD-333.1) 8)  Acute Prostatitis  (ICD-601.0) 9)  Spinal Stenosis, Lumbar  (ICD-724.02) 10)  Osteoarthritis, Lower Leg, Left  (ICD-715.36) 11)  Myofascial Pain Syndrome  (ICD-729.1) 12)  Urinary Frequency, Chronic  (ICD-788.41) 13)  Disuse Osteoporosis  (ICD-733.03) 14)  Hypogonadism, Male  (ICD-257.2) 15)  Syncope  (ICD-780.2) 16)  Hypokalemia  (ICD-276.8) 17)  Hemarthorsis, Upper Arm  (ICD-719.12) 18)  Uns Advrs Eff Uns Rx Medicinal&biological Sbstnc  (ICD-995.20) 19)  Gastritis  (ICD-535.50) 20)  Diverticulosis, Colon  (ICD-562.10)  21)  Colorectal Cancer, Family Hx  (ICD-V16.0) 22)  Constipation, Chronic  (ICD-564.09) 23)  Osteoporosis  (ICD-733.00) 24)  Low Back Pain  (ICD-724.2) 25)  Hyperlipidemia  (ICD-272.4) 26)  Physical Examination  (ICD-V70.0) 27)  Hypertension  (ICD-401.9) 28)  Gerd  (ICD-530.81)  Medications Prior to Update: 1)  Multivitamin .... Take 1 Tablet By Mouth Once A Day 2)  Omega 3 .... Take 2 Tablet By Mouth Once A Day 3)  Methadone Hcl 5 Mg Tabs (Methadone Hcl) .Marland Kitchen.. 1 Four  Times A Day 4)  Savella 50 Mg Tabs (Milnacipran Hcl) .... One By Mouth  Two Times A Day 5)  Red Yeast Rice 600 Mg Caps (Red Yeast Rice Extract) .... 2 Once Daily 6)  Calcium 650-Magnesium 250 .... 2 Once Daily 7)  Vitamin B-12 50 Mcg Tabs (Cyanocobalamin) .Marland Kitchen.. 1 Once Daily 8)  Vitamin C 1000 Mg Tabs (Ascorbic Acid) .Marland Kitchen.. 1 Once Daily 9)  Coq-10 200 Mg Caps (Coenzyme Q10) .Marland Kitchen.. 1 Once Daily 10)  Align  Caps (Probiotic Product) .Marland Kitchen.. 1 Once Daily 11)  Anti-Oxidant  Tabs (Multiple Vitamin) .Marland Kitchen.. 1 Once Daily 12)  Vitamin D-3 5000 Unit Tabs (Cholecalciferol) .... One By Mouth Daily Otc 13)  Vitamin D3 2000 Unit Caps (Cholecalciferol) .... One By Mouth Daily Otc 14)  Miralax  Powd (Polyethylene Glycol 3350) .... By Mouth Once Daily 15)  Tribenzor 40-5-12.5 Mg Tabs (Olmesartan-Amlodipine-Hctz) .... One By Mouth Daily 16)  Vesicare 5 Mg Tabs (Solifenacin Succinate) .... One By Mouth Q Hs  Current Medications (verified): 1)  Multivitamin .... Take 1 Tablet By Mouth Once A Day 2)  Omega 3 .... Take 2 Tablet By Mouth Once A Day 3)  Methadone Hcl 5 Mg Tabs (Methadone Hcl) .Marland Kitchen.. 1 Four Times A Day 4)  Savella 50 Mg Tabs (Milnacipran Hcl) .... One By Mouth  Two Times A Day 5)  Red Yeast Rice 600 Mg Caps (Red Yeast Rice Extract) .... 2 Once Daily 6)  Calcium 650-Magnesium 250 .... 2 Once Daily 7)  Vitamin B-12 50 Mcg Tabs (Cyanocobalamin) .Marland Kitchen.. 1 Once Daily 8)  Vitamin C 1000 Mg Tabs (Ascorbic Acid) .Marland Kitchen.. 1 Once Daily 9)  Coq-10 200 Mg Caps (Coenzyme Q10) .Marland Kitchen.. 1 Once Daily 10)  Align  Caps (Probiotic Product) .Marland Kitchen.. 1 Once Daily 11)  Anti-Oxidant  Tabs (Multiple Vitamin) .Marland Kitchen.. 1 Once Daily 12)  Vitamin D-3 5000 Unit Tabs (Cholecalciferol) .... One By Mouth Daily Otc 13)  Vitamin D3 2000 Unit Caps (Cholecalciferol) .... One By Mouth Daily Otc 14)  Miralax  Powd (Polyethylene Glycol 3350) .... By Mouth Once Daily 15)  Tribenzor 40-5-12.5 Mg Tabs (Olmesartan-Amlodipine-Hctz) .... One By Mouth Daily 16)  Vesicare  5 Mg Tabs (Solifenacin Succinate) .... One By Mouth Q Hs  Allergies (verified): 1)  ! Lyrica  Past History:  Family History: Last updated: 03/20/2010 Family HX Colorectal CA: Mother   Social History: Last updated: 05/21/2010 Retired-Disabled--district sales Married 2 children  Drug use-no Regular exercise-no Never Smoked  Risk Factors: Exercise: no (02/12/2007)  Risk Factors: Smoking Status: never (06/27/2010)  Past medical, surgical, family and social histories (including risk factors) reviewed, and no changes noted (except as noted below).  Past Medical History: Reviewed history from 05/21/2010 and no changes required.   NOCTURIA (ICD-788.43) TREMOR, ESSENTIAL, LEFT HAND AND RIGHT (ICD-333.1) HYPERSOMNIA, ASSOCIATED WITH SLEEP APNEA (ICD-780.53) ABDOMINAL PAIN RIGHT LOWER QUADRANT (ICD-789.03) TREMOR, ESSENTIAL (ICD-333.1) ACUTE PROSTATITIS (ICD-601.0) SPINAL STENOSIS, LUMBAR (ICD-724.02) OSTEOARTHRITIS, LOWER LEG, LEFT (ICD-715.36) MYOFASCIAL PAIN SYNDROME (  ICD-729.1) URINARY FREQUENCY, CHRONIC (ICD-788.41) DISUSE OSTEOPOROSIS (ICD-733.03) HYPOGONADISM, MALE (ICD-257.2) SYNCOPE (ICD-780.2) HYPOKALEMIA (ICD-276.8) HEMARTHORSIS, UPPER ARM (ICD-719.12) UNS ADVRS EFF UNS RX MEDICINAL&BIOLOGICAL SBSTNC (ICD-995.20) GASTRITIS (ICD-535.50) DIVERTICULOSIS, COLON (ICD-562.10) COLORECTAL CANCER, FAMILY HX (ICD-V16.0) CONSTIPATION, CHRONIC (ICD-564.09) OSTEOPOROSIS (ICD-733.00) LOW BACK PAIN (ICD-724.2) HYPERLIPIDEMIA (ICD-272.4) PHYSICAL EXAMINATION (ICD-V70.0) HYPERTENSION (ICD-401.9) GERD (ICD-530.81)    Past Surgical History: Reviewed history from 05/21/2010 and no changes required. Colonscopy 11/26/2000 Lumbar sx x 3 -2004 Herniorrhapy -1967  Family History: Reviewed history from 03/20/2010 and no changes required. Family HX Colorectal CA: Mother   Social History: Reviewed history from 05/21/2010 and no changes required. Retired-Disabled--district  sales Married 2 children  Drug use-no Regular exercise-no Never Smoked  Review of Systems  The patient denies anorexia, fever, weight loss, weight gain, vision loss, decreased hearing, hoarseness, chest pain, syncope, dyspnea on exertion, peripheral edema, prolonged cough, headaches, hemoptysis, abdominal pain, melena, hematochezia, severe indigestion/heartburn, hematuria, incontinence, genital sores, muscle weakness, suspicious skin lesions, transient blindness, difficulty walking, depression, unusual weight change, abnormal bleeding, enlarged lymph nodes, angioedema, and breast masses.    Physical Exam  General:  alert and pale.   Head:  normocephalic and atraumatic.   Eyes:  pupils equal and pupils round.   Ears:  R ear normal and L ear normal.   Nose:  no external deformity and no nasal discharge.   Mouth:  good dentition and pharynx pink and moist.   Neck:  supple and full ROM.   Lungs:  normal respiratory effort and no intercostal retractions.   Heart:  normal rate and regular rhythm.     Impression & Recommendations:  Problem # 1:  MYOFASCIAL PAIN SYNDROME (ICD-729.1) trial of the savella... no side effects but he does not believe there are positive effects yet His updated medication list for this problem includes:    Methadone Hcl 5 Mg Tabs (Methadone hcl) .Marland Kitchen... 1 four times a day continue the savella  Problem # 2:  PRIMARY CENTRAL SLEEP APNEA (ICD-327.21) on CPAP monitered by dr clance  Problem # 3:  TREMOR, ESSENTIAL (ICD-333.1)  Problem # 4:  LOW BACK PAIN (ICD-724.2)  His updated medication list for this problem includes:    Methadone Hcl 5 Mg Tabs (Methadone hcl) .Marland Kitchen... 1 four times a day  Problem # 5:  CONSTIPATION, CHRONIC (ICD-564.09)  His updated medication list for this problem includes:    Miralax Powd (Polyethylene glycol 3350) ..... By mouth once daily  increaser to Bid  Discussed dietary fiber measures and increased water intake.   Complete  Medication List: 1)  Multivitamin  .... Take 1 tablet by mouth once a day 2)  Omega 3  .... Take 2 tablet by mouth once a day 3)  Methadone Hcl 5 Mg Tabs (Methadone hcl) .Marland Kitchen.. 1 four times a day 4)  Savella 50 Mg Tabs (Milnacipran hcl) .... One by mouth  two times a day 5)  Red Yeast Rice 600 Mg Caps (Red yeast rice extract) .... 2 once daily 6)  Calcium 650-magnesium 250  .... 2 once daily 7)  Vitamin B-12 50 Mcg Tabs (Cyanocobalamin) .Marland Kitchen.. 1 once daily 8)  Vitamin C 1000 Mg Tabs (Ascorbic acid) .Marland Kitchen.. 1 once daily 9)  Coq-10 200 Mg Caps (Coenzyme q10) .Marland Kitchen.. 1 once daily 10)  Align Caps (Probiotic product) .Marland Kitchen.. 1 once daily 11)  Anti-oxidant Tabs (Multiple vitamin) .Marland Kitchen.. 1 once daily 12)  Vitamin D-3 5000 Unit Tabs (Cholecalciferol) .... One by mouth daily otc 13)  Vitamin D3 2000 Unit Caps (  Cholecalciferol) .... One by mouth daily otc 14)  Miralax Powd (Polyethylene glycol 3350) .... By mouth once daily 15)  Tribenzor 40-5-12.5 Mg Tabs (Olmesartan-amlodipine-hctz) .... One by mouth daily 16)  Vesicare 5 Mg Tabs (Solifenacin succinate) .... One by mouth q hs  Patient Instructions: 1)  Please schedule a follow-up appointment in 2 months.   Orders Added: 1)  Est. Patient Level IV [95621]

## 2010-09-20 NOTE — Assessment & Plan Note (Signed)
Summary: 2 month follow up/cjr   Vital Signs:  Patient profile:   65 year old male Height:      69 inches Weight:      152 pounds BMI:     22.53 Temp:     98.2 degrees F oral Pulse rate:   58 / minute Resp:     14 per minute BP sitting:   140 / 76  (left arm)  Vitals Entered By: Willy Eddy, LPN (March 26, 2010 9:46 AM) CC: roa labs from last ov in june Is Patient Diabetic? No   Primary Care Freeman Borba:  Darryll Capers, MD   CC:  roa labs from last ov in june.  History of Present Illness: follow up on vitamin d supplimantation with levels taking 7 k a yad and level is stable at 77 saw the GI specialist for constipation who recommended miralax this failed and he resumed the herbal laxities he has 5 awakening a night on average for urination his wife has observed sleep disturbances with gasps and jerks has never has a sleep study complains of excessive fatigue and hypoersomnolence    Preventive Screening-Counseling & Management  Alcohol-Tobacco     Smoking Status: never  Problems Prior to Update: 1)  Abdominal Pain Right Lower Quadrant  (ICD-789.03) 2)  Tremor, Essential  (ICD-333.1) 3)  Acute Prostatitis  (ICD-601.0) 4)  Spinal Stenosis, Lumbar  (ICD-724.02) 5)  Osteoarthritis, Lower Leg, Left  (ICD-715.36) 6)  Myofascial Pain Syndrome  (ICD-729.1) 7)  Urinary Frequency, Chronic  (ICD-788.41) 8)  Disuse Osteoporosis  (ICD-733.03) 9)  Hypogonadism, Male  (ICD-257.2) 10)  Syncope  (ICD-780.2) 11)  Hypokalemia  (ICD-276.8) 12)  Hemarthorsis, Upper Arm  (ICD-719.12) 13)  Uns Advrs Eff Uns Rx Medicinal&biological Sbstnc  (ICD-995.20) 14)  Gastritis  (ICD-535.50) 15)  Diverticulosis, Colon  (ICD-562.10) 16)  Colorectal Cancer, Family Hx  (ICD-V16.0) 17)  Constipation, Chronic  (ICD-564.09) 18)  Osteoporosis  (ICD-733.00) 19)  Low Back Pain  (ICD-724.2) 20)  Hyperlipidemia  (ICD-272.4) 21)  Physical Examination  (ICD-V70.0) 22)  Hypertension  (ICD-401.9) 23)   Gerd  (ICD-530.81)  Current Problems (verified): 1)  Abdominal Pain Right Lower Quadrant  (ICD-789.03) 2)  Tremor, Essential  (ICD-333.1) 3)  Acute Prostatitis  (ICD-601.0) 4)  Spinal Stenosis, Lumbar  (ICD-724.02) 5)  Osteoarthritis, Lower Leg, Left  (ICD-715.36) 6)  Myofascial Pain Syndrome  (ICD-729.1) 7)  Urinary Frequency, Chronic  (ICD-788.41) 8)  Disuse Osteoporosis  (ICD-733.03) 9)  Hypogonadism, Male  (ICD-257.2) 10)  Syncope  (ICD-780.2) 11)  Hypokalemia  (ICD-276.8) 12)  Hemarthorsis, Upper Arm  (ICD-719.12) 13)  Uns Advrs Eff Uns Rx Medicinal&biological Sbstnc  (ICD-995.20) 14)  Gastritis  (ICD-535.50) 15)  Diverticulosis, Colon  (ICD-562.10) 16)  Colorectal Cancer, Family Hx  (ICD-V16.0) 17)  Constipation, Chronic  (ICD-564.09) 18)  Osteoporosis  (ICD-733.00) 19)  Low Back Pain  (ICD-724.2) 20)  Hyperlipidemia  (ICD-272.4) 21)  Physical Examination  (ICD-V70.0) 22)  Hypertension  (ICD-401.9) 23)  Gerd  (ICD-530.81)  Medications Prior to Update: 1)  Multivitamin .... Take 1 Tablet By Mouth Once A Day 2)  Metoprolol Tartrate 50 Mg  Tabs (Metoprolol Tartrate) .... Two Times A Day 3)  Lotrel 10-20 Mg  Caps (Amlodipine Besy-Benazepril Hcl) .... Take 1 Tablet By Mouth Once A Day 4)  Omega 3 .... Take 2 Tablet By Mouth Once A Day 5)  Methadone Hcl 5 Mg Tabs (Methadone Hcl) .Marland Kitchen.. 1 Four Times A Day 6)  Celexa 20 Mg  Tabs (Citalopram Hydrobromide) .... Once Daily 7)  Red Yeast Rice 600 Mg Caps (Red Yeast Rice Extract) .... 2 Once Daily 8)  Calcium-Magnesium 750-465 Mg Tabs (Calcium-Magnesium) .... 2 Once Daily 9)  Vitamin B-12 50 Mcg Tabs (Cyanocobalamin) .Marland Kitchen.. 1 Once Daily 10)  Vitamin C 1000 Mg Tabs (Ascorbic Acid) .Marland Kitchen.. 1 Once Daily 11)  Coq-10 200 Mg Caps (Coenzyme Q10) .Marland Kitchen.. 1 Once Daily 12)  Align  Caps (Probiotic Product) .Marland Kitchen.. 1 Once Daily 13)  Anti-Oxidant  Tabs (Multiple Vitamin) .Marland Kitchen.. 1 Once Daily 14)  Vitamin D-3 5000 Unit Tabs (Cholecalciferol) .... One By Mouth  Daily Otc 15)  Vitamin D3 2000 Unit Caps (Cholecalciferol) .... One By Mouth Daily Otc 16)  Miralax  Powd (Polyethylene Glycol 3350) .... By Mouth Once Daily 17)  Clinoril 200 Mg Tabs (Sulindac) .... Take One Tab B.i.d. For 5 Days For Abdominal Discomfort  Then As Needed 18)  Constulose 10 Gm/75ml Soln (Lactulose) .... Take 2 Tablespoons B.i.d.  Current Medications (verified): 1)  Multivitamin .... Take 1 Tablet By Mouth Once A Day 2)  Metoprolol Tartrate 50 Mg  Tabs (Metoprolol Tartrate) .... Two Times A Day 3)  Lotrel 10-20 Mg  Caps (Amlodipine Besy-Benazepril Hcl) .... Take 1 Tablet By Mouth Once A Day 4)  Omega 3 .... Take 2 Tablet By Mouth Once A Day 5)  Methadone Hcl 5 Mg Tabs (Methadone Hcl) .Marland Kitchen.. 1 Four Times A Day 6)  Celexa 20 Mg  Tabs (Citalopram Hydrobromide) .... Once Daily 7)  Red Yeast Rice 600 Mg Caps (Red Yeast Rice Extract) .... 2 Once Daily 8)  Calcium 650-Magnesium 250 .... 2 Once Daily 9)  Vitamin B-12 50 Mcg Tabs (Cyanocobalamin) .Marland Kitchen.. 1 Once Daily 10)  Vitamin C 1000 Mg Tabs (Ascorbic Acid) .Marland Kitchen.. 1 Once Daily 11)  Coq-10 200 Mg Caps (Coenzyme Q10) .Marland Kitchen.. 1 Once Daily 12)  Align  Caps (Probiotic Product) .Marland Kitchen.. 1 Once Daily 13)  Anti-Oxidant  Tabs (Multiple Vitamin) .Marland Kitchen.. 1 Once Daily 14)  Vitamin D-3 5000 Unit Tabs (Cholecalciferol) .... One By Mouth Daily Otc 15)  Vitamin D3 2000 Unit Caps (Cholecalciferol) .... One By Mouth Daily Otc 16)  Miralax  Powd (Polyethylene Glycol 3350) .... By Mouth Once Daily  Allergies (verified): 1)  ! Lyrica  Past History:  Family History: Last updated: 03/20/2010 Family HX Colorectal CA: Mother   Social History: Last updated: 03/20/2010 Retired-Disabled  Married Drug use-no Regular exercise-no Never Smoked  Risk Factors: Exercise: no (02/12/2007)  Risk Factors: Smoking Status: never (03/26/2010)  Past medical, surgical, family and social histories (including risk factors) reviewed, and no changes noted (except as noted  below).  Past Medical History: Reviewed history from 05/15/2007 and no changes required. GERD Hypertension Back pain BPH Hyperlipidemia Low back pain Osteoporosis  Past Surgical History: Reviewed history from 02/12/2007 and no changes required. Colonscopy 11/26/2000 Lumbar sx x 2 -2004 Herniorrhapy -1967  Family History: Reviewed history from 03/20/2010 and no changes required. Family HX Colorectal CA: Mother   Social History: Reviewed history from 03/20/2010 and no changes required. Retired-Disabled  Married Drug use-no Regular exercise-no Never Smoked  Review of Systems  The patient denies anorexia, fever, weight loss, weight gain, vision loss, decreased hearing, hoarseness, chest pain, syncope, dyspnea on exertion, peripheral edema, prolonged cough, headaches, hemoptysis, abdominal pain, melena, hematochezia, severe indigestion/heartburn, hematuria, incontinence, genital sores, muscle weakness, suspicious skin lesions, transient blindness, difficulty walking, depression, unusual weight change, abnormal bleeding, enlarged lymph nodes, angioedema, and breast masses.  Physical Exam  General:  Well-developed,well-nourished,in no acute distress; alert,appropriate and cooperative throughout examination Head:  normocephalic and atraumatic.   Eyes:  pupils equal and pupils round.   Nose:  no external deformity, no external erythema, and no nasal discharge.   Neck:  supple and full ROM.   Lungs:  normal respiratory effort and no intercostal retractions.   Heart:  normal rate and regular rhythm.   Abdomen:  difuse tenderness in the mid epigastrum normal BS   Msk:  the pts DTR are decreased with some muscvle waistingdecreased ROM, lumbar lordosis, SI joint tenderness, and trigger point tenderness.    score for fibromyagial is high iwth all points tender and functioning status impaired Pulses:  R and L carotid,radial,femoral,dorsalis pedis and posterior tibial pulses are  full and equal bilaterally Extremities:  1+ left pedal edema and 1+ right pedal edema.   Neurologic:  alert & oriented X3, RLE weakness, LLE weakness, and LLE sensory loss.     Impression & Recommendations:  Problem # 1:  MYOFASCIAL PAIN SYNDROME (ICD-729.1) follwed by Dr Vear Clock The following medications were removed from the medication list:    Clinoril 200 Mg Tabs (Sulindac) .Marland Kitchen... Take one tab b.i.d. for 5 days for abdominal discomfort  then as needed His updated medication list for this problem includes:    Methadone Hcl 5 Mg Tabs (Methadone hcl) .Marland Kitchen... 1 four times a day  Problem # 2:  SPINAL STENOSIS, LUMBAR (ICD-724.02) as above  Problem # 3:  HYPERSOMNIA, ASSOCIATED WITH SLEEP APNEA (ICD-780.53)  wife observed risk and has excessive  Orders: Sleep Disorder Referral (Sleep Disorder)  Problem # 4:  DISUSE OSTEOPOROSIS (ICD-733.03) on vitamin d  Problem # 5:  HYPOGONADISM, MALE (ICD-257.2)  Problem # 6:  HYPERTENSION (ICD-401.9) bradycardia noted, this may contribute to the fatigue His updated medication list for this problem includes:    Bystolic 5 Mg Tabs (Nebivolol hcl) ..... One by mouth daily    Lotrel 10-20 Mg Caps (Amlodipine besy-benazepril hcl) .Marland Kitchen... Take 1 tablet by mouth once a day  BP today: 140/76 Prior BP: 126/80 (03/20/2010)  Prior 10 Yr Risk Heart Disease: 18 % (02/09/2009)  Labs Reviewed: K+: 3.5 (01/23/2010) Creat: : 1.0 (01/23/2010)   Chol: 178 (02/03/2009)   HDL: 59.70 (02/03/2009)   LDL: 108 (02/03/2009)   TG: 51.0 (02/03/2009)  Complete Medication List: 1)  Multivitamin  .... Take 1 tablet by mouth once a day 2)  Bystolic 5 Mg Tabs (Nebivolol hcl) .... One by mouth daily 3)  Lotrel 10-20 Mg Caps (Amlodipine besy-benazepril hcl) .... Take 1 tablet by mouth once a day 4)  Omega 3  .... Take 2 tablet by mouth once a day 5)  Methadone Hcl 5 Mg Tabs (Methadone hcl) .Marland Kitchen.. 1 four times a day 6)  Celexa 20 Mg Tabs (Citalopram hydrobromide) .... Once  daily 7)  Red Yeast Rice 600 Mg Caps (Red yeast rice extract) .... 2 once daily 8)  Calcium 650-magnesium 250  .... 2 once daily 9)  Vitamin B-12 50 Mcg Tabs (Cyanocobalamin) .Marland Kitchen.. 1 once daily 10)  Vitamin C 1000 Mg Tabs (Ascorbic acid) .Marland Kitchen.. 1 once daily 11)  Coq-10 200 Mg Caps (Coenzyme q10) .Marland Kitchen.. 1 once daily 12)  Align Caps (Probiotic product) .Marland Kitchen.. 1 once daily 13)  Anti-oxidant Tabs (Multiple vitamin) .Marland Kitchen.. 1 once daily 14)  Vitamin D-3 5000 Unit Tabs (Cholecalciferol) .... One by mouth daily otc 15)  Vitamin D3 2000 Unit Caps (Cholecalciferol) .... One by mouth daily otc  16)  Miralax Powd (Polyethylene glycol 3350) .... By mouth once daily  Patient Instructions: 1)  Please schedule a follow-up appointment in 2 months. Prescriptions: BYSTOLIC 5 MG TABS (NEBIVOLOL HCL) one by mouth daily  #30 x 11   Entered and Authorized by:   Stacie Glaze MD   Signed by:   Stacie Glaze MD on 03/26/2010   Method used:   Electronically to        [id number] (retail)       [address]       [place], [place]  [address]       Ph: [phone] or [phone]       Fax: [phone]   RxID:   [id number] CELEXA 20 MG  TABS (CITALOPRAM HYDROBROMIDE) once daily  #30 x 6   Entered by:   Willy Eddy, LPN   Authorized by:   Stacie Glaze MD   Signed by:   Willy Eddy, LPN on 51/88/4166   Method used:   Electronically to        [id number] (retail)       [address]       [place], [place]  [address]       Ph: [phone] or [phone]       Fax: [phone]   RxID:   [id number]

## 2010-09-20 NOTE — Progress Notes (Signed)
Summary: Questions about his last rov   Phone Note Call from Patient Call back at Home Phone [phone]   Caller: Spouse Call For: Dr Arlyce Dice Summary of Call: Questions about his last office visit Initial call taken by: Leanor Kail [facility],  May 31, 2010 12:42 PM  Follow-up for Phone Call        Questions answered about last office visit.  Patient will call back if they have any further problems.   Follow-up by: Darcey Nora RN, CGRN,  May 31, 2010 3:12 PM

## 2010-09-20 NOTE — Assessment & Plan Note (Addendum)
Summary: rov for discussion of sleep study   Visit Type:  Follow-up Copy to:  Darryll Capers Primary Provider/Referring Provider:  Darryll Capers, MD   CC:  pt here to discuss sleep study results. .  History of Present Illness: the pt comes in today for f/u of his recent sleep study.  He has known central >> obst. sleep apnea, and was started on cpap last visit at a low pressure level.  He has been wearing compliantly, and the first 2 weeks thought he did better.  Most recently, he has been having issues with awakening with the mask off his face.  He also has issues with nocturia which interferes with cpap use.  His sleep study showed optimal pressure on bilevel of 10/5.  Cpap resulted in pressure induced central apneas.  I have reviewed the study in detail with the pt and family member, and answered all of his questions.  Current Medications (verified): 1)  Multivitamin .... Take 1 Tablet By Mouth Once A Day 2)  Omega 3 .... Take 2 Tablet By Mouth Once A Day 3)  Methadone Hcl 5 Mg Tabs (Methadone Hcl) .Marland Kitchen.. 1 Four Times A Day 4)  Savella 50 Mg Tabs (Milnacipran Hcl) .... One By Mouth  Two Times A Day 5)  Red Yeast Rice 600 Mg Caps (Red Yeast Rice Extract) .... 2 Once Daily 6)  Calcium 650-Magnesium 250 .... 2 Once Daily 7)  Vitamin B-12 50 Mcg Tabs (Cyanocobalamin) .Marland Kitchen.. 1 Once Daily 8)  Vitamin C 1000 Mg Tabs (Ascorbic Acid) .Marland Kitchen.. 1 Once Daily 9)  Coq-10 200 Mg Caps (Coenzyme Q10) .Marland Kitchen.. 1 Once Daily 10)  Align  Caps (Probiotic Product) .Marland Kitchen.. 1 Once Daily 11)  Anti-Oxidant  Tabs (Multiple Vitamin) .Marland Kitchen.. 1 Once Daily 12)  Vitamin D-3 5000 Unit Tabs (Cholecalciferol) .... One By Mouth Daily Otc 13)  Vitamin D3 2000 Unit Caps (Cholecalciferol) .... One By Mouth Daily Otc 14)  Miralax  Powd (Polyethylene Glycol 3350) .... By Mouth Two Times A Day 15)  Tribenzor 40-5-12.5 Mg Tabs (Olmesartan-Amlodipine-Hctz) .... One By Mouth Daily 16)  Vesicare 5 Mg Tabs (Solifenacin Succinate) .... One By Mouth Q  Hs  Allergies (verified): 1)  ! Lyrica  Review of Systems       The patient complains of acid heartburn, indigestion, hand/feet swelling, joint stiffness or pain, and rash.  The patient denies shortness of breath at rest, productive cough, non-productive cough, coughing up blood, chest pain, irregular heartbeats, loss of appetite, weight change, abdominal pain, difficulty swallowing, sore throat, headaches, nasal congestion/difficulty breathing through nose, sneezing, itching, ear ache, anxiety, depression, change in color of mucus, and fever.    Vital Signs:  Patient profile:   65 year old male Height:      69 inches Weight:      156.38 pounds BMI:     23.18 O2 Sat:      98 % on Room air Temp:     97.8 degrees F oral Pulse rate:   68 / minute BP sitting:   130 / 78  (left arm) Cuff size:   regular  Vitals Entered By: Carver Fila (July 19, 2010 3:55 PM)  O2 Flow:  Room air CC: pt here to discuss sleep study results.  Comments meds and allergies updated Phone number updated Carver Fila  July 19, 2010 3:55 PM    Physical Exam  General:  thin male in nad   Impression & Recommendations:  Problem # 1:  PRIMARY  CENTRAL SLEEP APNEA (ICD-327.21) the pt has primarily central sleep apnea with a small obstructive component.  He is having cpap tolerance issues at home, and clearly had increased pressure induced centrals during his titration study on cpap.  He was changed to bilevel, and appeared to have reasonable control of his central and obstructive apneas with 10/5.  I have also discussed treatment of his central apneas with simple nocturnal oxygen.  However, this may not help his snoring (but there is some data oxygen can treat very mild osa).   We have discussed the options at great length, and decided to try bilevel for now, as we contact insurance company for preauth for oxygen.  Time spent with pt .  Medications Added to Medication List This Visit: 1)  Miralax Powd  (Polyethylene glycol 3350) .... By mouth two times a day  Other Orders: Est. Patient Level III ([address]) DME Referral (DME)  Patient Instructions: 1)  will change you over to bilevel at 10/5.  Please call me in 3 weeks with how things are going. 2)  will check with insurance about getting nocturnal oxygen approved for treatment of central sleep apnea.  Appended Document: rov for discussion of sleep study megan, never heard back from this pt...is he still on bilevel??  needs ov with me either way.  Appended Document: rov for discussion of sleep study [id number]  Appended Document: rov for discussion of sleep study pt called back.  pt states he is on the bilevel and hasn't noticed a difference in his sleep but states his wife states pt doesn't snore. schedule pt for an appt with kc on monday 11-05-2010 at 10am to discuss his osa.    Appended Document: rov for discussion of sleep study please see if dme can get oxygen thru insurance guidelines as a treatment for central sleep apnea without having low enough desats .  this is an acceptable treatment for central sleep apnea...we Janit Pagan to precert ourselves thru insurance??

## 2010-09-20 NOTE — Assessment & Plan Note (Signed)
Summary: abd disconfort, constipation...em    History of Present Illness Visit Type: Follow-up Visit Primary GI MD: Melvia Heaps MD [facility] Primary Provider: Darryll Capers, MD  Requesting Provider: na Chief Complaint: Abdominal discomfot and constipation History of Present Illness:   [name] is a pleasant 65 year old white male referred at the request of Dr. Lovell Sheehan for evaluation of abdominal discomfort and constipation.  Since taking methadone for chronic back pain he has had difficulty moving his bowels.  He takes an herbal supplement and MiraLax daily.  With this he is able to move his bowels fairly regularly although he does a great deal of straining.  He denies melena or hematochezia.  Last colonoscopy in 2008 was pertinent for a few diverticula and melanosis coli.  He also complains of soreness in the right groin.  He is status post herniorrhaphy and claims that this pain is reminiscent of his prior hernia pain.   GI Review of Systems    Reports abdominal pain.     Location of  Abdominal pain: lower abdomen.    Denies acid reflux, belching, bloating, chest pain, dysphagia with liquids, dysphagia with solids, heartburn, loss of appetite, nausea, vomiting, vomiting blood, weight loss, and  weight gain.      Reports constipation.     Denies anal fissure, black tarry stools, change in bowel habit, diarrhea, diverticulosis, fecal incontinence, heme positive stool, hemorrhoids, irritable bowel syndrome, jaundice, light color stool, liver problems, rectal bleeding, and  rectal pain.    Current Medications (verified): 1)  Multivitamin .... Take 1 Tablet By Mouth Once A Day 2)  Metoprolol Tartrate 50 Mg  Tabs (Metoprolol Tartrate) .... Two Times A Day 3)  Lotrel 10-20 Mg  Caps (Amlodipine Besy-Benazepril Hcl) .... Take 1 Tablet By Mouth Once A Day 4)  Omega 3 .... Take 2 Tablet By Mouth Once A Day 5)  Methadone Hcl 5 Mg Tabs (Methadone Hcl) .Marland Kitchen.. 1 Four Times A Day 6)  Celexa 20 Mg  Tabs  (Citalopram Hydrobromide) .... Once Daily 7)  Red Yeast Rice 600 Mg Caps (Red Yeast Rice Extract) .... 2 Once Daily 8)  Calcium-Magnesium 750-465 Mg Tabs (Calcium-Magnesium) .... 2 Once Daily 9)  Vitamin B-12 50 Mcg Tabs (Cyanocobalamin) .Marland Kitchen.. 1 Once Daily 10)  Vitamin C 1000 Mg Tabs (Ascorbic Acid) .Marland Kitchen.. 1 Once Daily 11)  Coq-10 200 Mg Caps (Coenzyme Q10) .Marland Kitchen.. 1 Once Daily 12)  Align  Caps (Probiotic Product) .Marland Kitchen.. 1 Once Daily 13)  Anti-Oxidant  Tabs (Multiple Vitamin) .Marland Kitchen.. 1 Once Daily 14)  Vitamin D-3 5000 Unit Tabs (Cholecalciferol) .... One By Mouth Daily Otc 15)  Vitamin D3 2000 Unit Caps (Cholecalciferol) .... One By Mouth Daily Otc 16)  Miralax  Powd (Polyethylene Glycol 3350) .... By Mouth Once Daily  Allergies (verified): 1)  ! Lyrica  Past History:  Past Medical History: Reviewed history from 05/15/2007 and no changes required. GERD Hypertension Back pain BPH Hyperlipidemia Low back pain Osteoporosis  Past Surgical History: Reviewed history from 02/12/2007 and no changes required. Colonscopy 11/26/2000 Lumbar sx x 2 -2004 Herniorrhapy -1967  Family History: Family HX Colorectal CA: Mother   Social History: Retired-Disabled  Married Drug use-no Regular exercise-no Never Smoked  Review of Systems       The patient complains of back pain.  The patient denies allergy/sinus, anemia, anxiety-new, arthritis/joint pain, blood in urine, breast changes/lumps, change in vision, confusion, cough, coughing up blood, depression-new, fainting, fatigue, fever, headaches-new, hearing problems, heart murmur, heart rhythm changes, itching, muscle  pains/cramps, night sweats, nosebleeds, shortness of breath, skin rash, sleeping problems, sore throat, swelling of feet/legs, swollen lymph glands, thirst - excessive, urination - excessive, urination changes/pain, urine leakage, vision changes, and voice change.         All other systems were reviewed and were negative   Vital  Signs:  Patient profile:   65 year old male Height:      69 inches Weight:      150 pounds BMI:     22.23 BSA:     1.83 Pulse rate:   60 / minute Pulse rhythm:   regular BP sitting:   126 / 80  (left arm) Cuff size:   regular  Vitals Entered By: Ok Anis CMA (March 20, 2010 3:35 PM)  Physical Exam  Additional Exam:  On physical exam he is well developed well-nourished male  skin: anicteric HEENT: normocephalic; PEERLA; no nasal or pharyngeal abnormalities neck: supple nodes: no cervical lymphadenopathy chest: clear to ausculatation and percussion heart: no murmurs, gallops, or rubs abd: soft, nontender; BS normoactive; no abdominal masses, tenderness, organomegaly; there is no obvious inguinal hernia rectal: deferred ext: no cynanosis, clubbing, edema skeletal: no deformities neuro: oriented x 3; no focal abnormalities    Impression & Recommendations:  Problem # 1:  CONSTIPATION, CHRONIC (ICD-564.09) This has been a chronic problem for which she is still symptomatic.  Recommendations #1 begin lactulose 30 cc b.i.d.  He was instructed to increase or lower the dose as necessary. #2 discontinue herbal supplements for now and hold  MiraLax  Problem # 2:  ABDOMINAL PAIN RIGHT LOWER QUADRANT (ICD-789.03) An inguinal hernia is not apparent on my exam though it remains a possibility.  This could be due to musculoskeletal pain as well.  Recommendations #1 trial of Clinoril 20 mg b.i.d. for 5 days then p.r.n. #2 if not improved with NSAIDs I will refer him for a second opinion by general surgery  Patient Instructions: 1)  Copy sent to : Darryll Capers, MD  2)  You can pick up your prescription at your pharmacy today 3)  Make a follow up appointment with Dr Arlyce Dice in 4-6 weeks 4)  The medication list was reviewed and reconciled.  All changed / newly prescribed medications were explained.  A complete medication list was provided to the patient /  caregiver. Prescriptions: CONSTULOSE 10 GM/15ML SOLN (LACTULOSE) take 2 tablespoons b.i.d.  #500 cc x 5   Entered and Authorized by:   Louis Meckel MD   Signed by:   Louis Meckel MD on 03/20/2010   Method used:   Electronically to        [id number] (retail)       [address]       [place], [place]  [address]       Ph: [phone] or [phone]       Fax: [phone]   RxID:   [id number] CLINORIL 200 MG TABS (SULINDAC) take one tab b.i.d. for 5 days for abdominal discomfort  then as needed  #20 x 1   Entered and Authorized by:   Louis Meckel MD   Signed by:   Louis Meckel MD on 03/20/2010   Method used:   Electronically to        [id number] (retail)       [address]       [place], [place]  [address]       Ph: [phone] or [phone]  Fax: [phone]   RxID:   [id number]

## 2010-09-20 NOTE — Miscellaneous (Signed)
Summary: Request for supporting documentation/[facility]  Request for supporting documentation/[facility]   Imported By: Sherian Rein 08/29/2010 14:30:10  _____________________________________________________________________  External Attachment:    Type:   Image     Comment:   External Document

## 2010-09-20 NOTE — Assessment & Plan Note (Signed)
Summary: 1 month rov/njr   Vital Signs:  Patient profile:   65 year old male Height:      69 inches Weight:      146 pounds BMI:     21.64 Temp:     98.2 degrees F oral Pulse rate:   60 / minute Resp:     14 per minute BP sitting:   136 / 80  (left arm)  Vitals Entered By: Willy Eddy, LPN (November 22, 1608 2:42 PM) CC: roa   CC:  roa.  History of Present Illness: had an epidural and the shot did not work. HAD THE RECLAST and had mild flu symptoms as expected will moniter the calcium levels discusion of the interaction with calcium and medications persistant pain and the pts since of poor health went to [place] and there was no curgical exercize the chronic pain is what effects him  Preventive Screening-Counseling & Management  Alcohol-Tobacco     Smoking Status: never  Problems Prior to Update: 1)  Tremor, Essential  (ICD-333.1) 2)  Acute Prostatitis  (ICD-601.0) 3)  Spinal Stenosis, Lumbar  (ICD-724.02) 4)  Osteoarthritis, Lower Leg, Left  (ICD-715.36) 5)  Myofascial Pain Syndrome  (ICD-729.1) 6)  Urinary Frequency, Chronic  (ICD-788.41) 7)  Disuse Osteoporosis  (ICD-733.03) 8)  Hypogonadism, Male  (ICD-257.2) 9)  Syncope  (ICD-780.2) 10)  Hypokalemia  (ICD-276.8) 11)  Hemarthorsis, Upper Arm  (ICD-719.12) 12)  Uns Advrs Eff Uns Rx Medicinal&biological Sbstnc  (ICD-995.20) 13)  Gastritis  (ICD-535.50) 14)  Diverticulosis, Colon  (ICD-562.10) 15)  Colorectal Cancer, Family Hx  (ICD-V16.0) 16)  Constipation, Chronic  (ICD-564.09) 17)  Osteoporosis  (ICD-733.00) 18)  Low Back Pain  (ICD-724.2) 19)  Hyperlipidemia  (ICD-272.4) 20)  Physical Examination  (ICD-V70.0) 21)  Hypertension  (ICD-401.9) 22)  Gerd  (ICD-530.81)  Current Problems (verified): 1)  Acute Prostatitis  (ICD-601.0) 2)  Spinal Stenosis, Lumbar  (ICD-724.02) 3)  Osteoarthritis, Lower Leg, Left  (ICD-715.36) 4)  Myofascial Pain Syndrome  (ICD-729.1) 5)  Urinary Frequency, Chronic   (ICD-788.41) 6)  Disuse Osteoporosis  (ICD-733.03) 7)  Hypogonadism, Male  (ICD-257.2) 8)  Syncope  (ICD-780.2) 9)  Hypokalemia  (ICD-276.8) 10)  Hemarthorsis, Upper Arm  (ICD-719.12) 11)  Uns Advrs Eff Uns Rx Medicinal&biological Sbstnc  (ICD-995.20) 12)  Gastritis  (ICD-535.50) 13)  Diverticulosis, Colon  (ICD-562.10) 14)  Colorectal Cancer, Family Hx  (ICD-V16.0) 15)  Constipation, Chronic  (ICD-564.09) 16)  Osteoporosis  (ICD-733.00) 17)  Low Back Pain  (ICD-724.2) 18)  Hyperlipidemia  (ICD-272.4) 19)  Physical Examination  (ICD-V70.0) 20)  Hypertension  (ICD-401.9) 21)  Gerd  (ICD-530.81)  Medications Prior to Update: 1)  Multivitamin .... Take 1 Tablet By Mouth Once A Day 2)  Metoprolol Tartrate 50 Mg  Tabs (Metoprolol Tartrate) .... Two Times A Day 3)  Lotrel 10-20 Mg  Caps (Amlodipine Besy-Benazepril Hcl) .... Take 1 Tablet By Mouth Once A Day 4)  Omega 3 .... Take 2 Tablet By Mouth Once A Day 5)  Methadone Hcl 5 Mg Tabs (Methadone Hcl) .Marland Kitchen.. 1 Four Times A Day 6)  Celexa 20 Mg  Tabs (Citalopram Hydrobromide) .... Once Daily 7)  Vitamin D 96045 Unit  Caps (Ergocalciferol) .... One By Mouth Weekly 8)  Red Yeast Rice 600 Mg Caps (Red Yeast Rice Extract) .... 2 Once Daily 9)  Calcium-Magnesium 750-465 Mg Tabs (Calcium-Magnesium) .... 2 Once Daily 10)  Dhea 25 Mg Tabs (Prasterone (Dhea)) .Marland Kitchen.. 1 Once Daily 11)  Vitamin B-12  50 Mcg Tabs (Cyanocobalamin) .Marland Kitchen.. 1 Once Daily 12)  Vitamin C 1000 Mg Tabs (Ascorbic Acid) .Marland Kitchen.. 1 Once Daily 13)  Coq-10 200 Mg Caps (Coenzyme Q10) .Marland Kitchen.. 1 Once Daily 14)  Align  Caps (Probiotic Product) .Marland Kitchen.. 1 Once Daily 15)  Anti-Oxidant  Tabs (Multiple Vitamin) .Marland Kitchen.. 1 Once Daily  Current Medications (verified): 1)  Multivitamin .... Take 1 Tablet By Mouth Once A Day 2)  Metoprolol Tartrate 50 Mg  Tabs (Metoprolol Tartrate) .... Two Times A Day 3)  Lotrel 10-20 Mg  Caps (Amlodipine Besy-Benazepril Hcl) .... Take 1 Tablet By Mouth Once A Day 4)  Omega 3  .... Take 2 Tablet By Mouth Once A Day 5)  Methadone Hcl 5 Mg Tabs (Methadone Hcl) .Marland Kitchen.. 1 Four Times A Day 6)  Celexa 20 Mg  Tabs (Citalopram Hydrobromide) .... Once Daily 7)  Vitamin D 78295 Unit  Caps (Ergocalciferol) .... One By Mouth Weekly 8)  Red Yeast Rice 600 Mg Caps (Red Yeast Rice Extract) .... 2 Once Daily 9)  Calcium-Magnesium 750-465 Mg Tabs (Calcium-Magnesium) .... 2 Once Daily 10)  Dhea 25 Mg Tabs (Prasterone (Dhea)) .Marland Kitchen.. 1 Once Daily 11)  Vitamin B-12 50 Mcg Tabs (Cyanocobalamin) .Marland Kitchen.. 1 Once Daily 12)  Vitamin C 1000 Mg Tabs (Ascorbic Acid) .Marland Kitchen.. 1 Once Daily 13)  Coq-10 200 Mg Caps (Coenzyme Q10) .Marland Kitchen.. 1 Once Daily 14)  Align  Caps (Probiotic Product) .Marland Kitchen.. 1 Once Daily 15)  Anti-Oxidant  Tabs (Multiple Vitamin) .Marland Kitchen.. 1 Once Daily  Allergies (verified): 1)  ! Lyrica  Past History:  Family History: Last updated: 02/12/2007 Family hx Colorectal CA  Social History: Last updated: 02/12/2007 Married Drug use-no Regular exercise-no Never Smoked  Risk Factors: Exercise: no (02/12/2007)  Risk Factors: Smoking Status: never (11/22/2009)  Past medical, surgical, family and social histories (including risk factors) reviewed, and no changes noted (except as noted below).  Past Medical History: Reviewed history from 05/15/2007 and no changes required. GERD Hypertension Back pain BPH Hyperlipidemia Low back pain Osteoporosis  Past Surgical History: Reviewed history from 02/12/2007 and no changes required. Colonscopy 11/26/2000 Lumbar sx x 2 -2004 Herniorrhapy -1967  Family History: Reviewed history from 02/12/2007 and no changes required. Family hx Colorectal CA  Social History: Reviewed history from 02/12/2007 and no changes required. Married Drug use-no Regular exercise-no Never Smoked  Review of Systems  The patient denies anorexia, fever, weight loss, weight gain, vision loss, decreased hearing, hoarseness, chest pain, syncope, dyspnea on exertion,  peripheral edema, prolonged cough, headaches, hemoptysis, abdominal pain, melena, hematochezia, severe indigestion/heartburn, hematuria, incontinence, genital sores, muscle weakness, suspicious skin lesions, transient blindness, difficulty walking, depression, unusual weight change, abnormal bleeding, enlarged lymph nodes, angioedema, breast masses, and testicular masses.    Physical Exam  General:  Well-developed,well-nourished,in no acute distress; alert,appropriate and cooperative throughout examination Head:  normocephalic and atraumatic.   Eyes:  pupils equal and pupils round.   Ears:  R ear normal and L ear normal.   Nose:  no external deformity, no external erythema, and no nasal discharge.   Mouth:  good dentition and pharynx pink and moist.   Neck:  supple and full ROM.   Lungs:  normal respiratory effort and no intercostal retractions.   Heart:  normal rate and regular rhythm.   Abdomen:  difuse tenderness in the mid epigastrum normal BS   Msk:  the pts DTR are decreased with some muscvle waistingdecreased ROM, lumbar lordosis, SI joint tenderness, and trigger point tenderness.   Extremities:  1+ left pedal edema and 1+ right pedal edema.   Neurologic:  alert & oriented X3, RLE weakness, LLE weakness, and LLE sensory loss.     Impression & Recommendations:  Problem # 1:  SPINAL STENOSIS, LUMBAR (ICD-724.02) epidural number 2 and htree recommened consider increasing the methadone 5,5,5 and 10 would be my recomendation  Problem # 2:  MYOFASCIAL PAIN SYNDROME (ICD-729.1) increased pain His updated medication list for this problem includes:    Methadone Hcl 5 Mg Tabs (Methadone hcl) .Marland Kitchen... 1 four times a day  Problem # 3:  HYPOGONADISM, MALE (ICD-257.2)  Problem # 4:  TREMOR, ESSENTIAL (ICD-333.1) essential tremor  Problem # 5:  LOW BACK PAIN (ICD-724.2)  His updated medication list for this problem includes:    Methadone Hcl 5 Mg Tabs (Methadone hcl) .Marland Kitchen... 1 four times  a day  Discussed use of moist heat or ice, modified activities, medications, and stretching/strengthening exercises. Back care instructions given. To be seen in 2 weeks if no improvement; sooner if worsening of symptoms.   Complete Medication List: 1)  Multivitamin  .... Take 1 tablet by mouth once a day 2)  Metoprolol Tartrate 50 Mg Tabs (Metoprolol tartrate) .... Two times a day 3)  Lotrel 10-20 Mg Caps (Amlodipine besy-benazepril hcl) .... Take 1 tablet by mouth once a day 4)  Omega 3  .... Take 2 tablet by mouth once a day 5)  Methadone Hcl 5 Mg Tabs (Methadone hcl) .Marland Kitchen.. 1 four times a day 6)  Celexa 20 Mg Tabs (Citalopram hydrobromide) .... Once daily 7)  Vitamin D 04540 Unit Caps (Ergocalciferol) .... One by mouth weekly 8)  Red Yeast Rice 600 Mg Caps (Red yeast rice extract) .... 2 once daily 9)  Calcium-magnesium 750-465 Mg Tabs (Calcium-magnesium) .... 2 once daily 10)  Dhea 25 Mg Tabs (Prasterone (dhea)) .Marland Kitchen.. 1 once daily 11)  Vitamin B-12 50 Mcg Tabs (Cyanocobalamin) .Marland Kitchen.. 1 once daily 12)  Vitamin C 1000 Mg Tabs (Ascorbic acid) .Marland Kitchen.. 1 once daily 13)  Coq-10 200 Mg Caps (Coenzyme q10) .Marland Kitchen.. 1 once daily 14)  Align Caps (Probiotic product) .Marland Kitchen.. 1 once daily 15)  Anti-oxidant Tabs (Multiple vitamin) .Marland Kitchen.. 1 once daily  Patient Instructions: 1)  Please schedule a follow-up appointment in 2 months. Prescriptions: METOPROLOL TARTRATE 50 MG  TABS (METOPROLOL TARTRATE) two times a day  #180 x 3   Entered by:   Willy Eddy, LPN   Authorized by:   Stacie Glaze MD   Signed by:   Willy Eddy, LPN on 98/06/9146   Method used:   Electronically to        [id number] ([facility]ail[facility]       [address]       [place], [place]  [address]       Ph: [phone] or [phone]       Fax: [phone]   RxID:   [id number] LOTREL 10-20 MG  CAPS (AMLODIPINE BESY-BENAZEPRIL HCL) Take 1 tablet by mouth once a day  #90 x 3   Entered by:   Willy Eddy, LPN   Authorized by:    Stacie Glaze MD   Signed by:   Willy Eddy, LPN on 38/75/6433   Method used:   Electronically to        [id number] ([facility]ail)       [address]       [place], [place]  [address]  Ph: [phone] or [phone]       Fax: [phone]   RxID:   [id number]

## 2010-09-20 NOTE — Assessment & Plan Note (Signed)
Summary: 2 mo rov/mm   Vital Signs:  Patient profile:   65 year old male Height:      69 inches Weight:      150 pounds BMI:     22.23 Temp:     98.2 degrees F oral Pulse rate:   60 / minute Resp:     14 per minute BP sitting:   138 / 80  (left arm)  Vitals Entered By: Willy Eddy, LPN (January 23, 453 10:04 AM) CC: roa   CC:  roa.  History of Present Illness: Had three epidurals and got no response Dr Vear Clock  ordered these concern over hx of osteoporosis  on the reclast ( had first injection was march 2011) due bone density next summer the pt is concerned oover medicare the pt is taking calcium  and vit d taking one 5 k and one 2 k a day needs a vitamin d increased fatique, increased dizzyness and some lack of balance ? steriods? 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.03) 8)  Hypogonadism, Male  (ICD-257.2) 9)  Syncope  (ICD-780.2) 10)  Hypokalemia  (ICD-276.8) 11)  Hemarthorsis, Upper Arm  (ICD-719.12) 12)  Uns Advrs Eff Uns Rx Medicinal&biological Sbstnc  (ICD-995.20) 13)  Gastritis  (ICD-535.50) 14)  Diverticulosis, Colon  (ICD-562.10) 15)  Colorectal Cancer, Family Hx  (ICD-V16.0) 16)  Constipation, Chronic  (ICD-564.09) 17)  Osteoporosis  (ICD-733.00) 18)  Low Back Pain  (ICD-724.2) 19)  Hyperlipidemia  (ICD-272.4) 20)  Physical Examination  (ICD-V70.0) 21)  Hypertension  (ICD-401.9) 22)  Gerd  (ICD-530.81)  Current Problems (verified): 1)  Tremor, Essential  (ICD-333.1) 2)  Acute Prostatitis  (ICD-601.0) 3)  Spinal Stenosis, Lumbar  (ICD-724.02) 4)  Osteoarthritis,  Lower Leg, Left  (ICD-715.36) 5)  Myofascial Pain Syndrome  (ICD-729.1) 6)  Urinary Frequency, Chronic  (ICD-788.41) 7)  Disuse Osteoporosis  (ICD-733.03) 8)  Hypogonadism, Male  (ICD-257.2) 9)  Syncope  (ICD-780.2) 10)  Hypokalemia  (ICD-276.8) 11)  Hemarthorsis, Upper Arm  (ICD-719.12) 12)  Uns Advrs Eff Uns Rx Medicinal&biological Sbstnc  (ICD-995.20) 13)  Gastritis  (ICD-535.50) 14)  Diverticulosis, Colon  (ICD-562.10) 15)  Colorectal Cancer, Family Hx  (ICD-V16.0) 16)  Constipation, Chronic  (ICD-564.09) 17)  Osteoporosis  (ICD-733.00) 18)  Low Back Pain  (ICD-724.2) 19)  Hyperlipidemia  (ICD-272.4) 20)  Physical Examination  (ICD-V70.0) 21)  Hypertension  (ICD-401.9) 22)  Gerd  (ICD-530.81)  Medications Prior to Update: 1)  Multivitamin .... Take 1 Tablet By Mouth Once A Day 2)  Metoprolol Tartrate 50 Mg  Tabs (Metoprolol Tartrate) .... Two Times A Day 3)  Lotrel 10-20 Mg  Caps (Amlodipine Besy-Benazepril Hcl) .... Take 1 Tablet By Mouth Once A Day 4)  Omega 3 .... Take 2 Tablet By Mouth Once A Day 5)  Methadone Hcl 5 Mg Tabs (Methadone Hcl) .Marland Kitchen.. 1 Four Times A Day 6)  Celexa 20 Mg  Tabs (Citalopram Hydrobromide) .... Once Daily 7)  Vitamin D 09811 Unit  Caps (Ergocalciferol) .... One By Mouth Weekly 8)  Red Yeast Rice 600 Mg Caps (Red Yeast Rice Extract) .... 2 Once Daily 9)  Calcium-Magnesium 750-465 Mg Tabs (Calcium-Magnesium) .... 2 Once Daily 10)  Dhea 25 Mg Tabs (Prasterone (Dhea)) .Marland Kitchen.. 1 Once Daily 11)  Vitamin B-12 50 Mcg Tabs (Cyanocobalamin) .Marland Kitchen.. 1 Once Daily 12)  Vitamin C 1000 Mg Tabs (Ascorbic Acid) .Marland Kitchen.. 1 Once Daily 13)  Coq-10 200 Mg Caps (Coenzyme Q10) .Marland Kitchen.. 1 Once Daily 14)  Align  Caps (Probiotic Product) .Marland Kitchen.. 1 Once Daily 15)  Anti-Oxidant  Tabs (Multiple Vitamin) .Marland Kitchen.. 1 Once Daily  Current Medications (verified): 1)  Multivitamin .... Take 1 Tablet By Mouth Once A Day 2)  Metoprolol Tartrate 50 Mg  Tabs (Metoprolol Tartrate) .... Two Times A Day 3)   Lotrel 10-20 Mg  Caps (Amlodipine Besy-Benazepril Hcl) .... Take 1 Tablet By Mouth Once A Day 4)  Omega 3 .... Take 2 Tablet By Mouth Once A Day 5)  Methadone Hcl 5 Mg Tabs (Methadone Hcl) .Marland Kitchen.. 1 Four Times A Day 6)  Celexa 20 Mg  Tabs (Citalopram Hydrobromide) .... Once Daily 7)  Red Yeast Rice 600 Mg Caps (Red Yeast Rice Extract) .... 2 Once Daily 8)  Calcium-Magnesium 750-465 Mg Tabs (Calcium-Magnesium) .... 2 Once Daily 9)  Vitamin B-12 50 Mcg Tabs (Cyanocobalamin) .Marland Kitchen.. 1 Once Daily 10)  Vitamin C 1000 Mg Tabs (Ascorbic Acid) .Marland Kitchen.. 1 Once Daily 11)  Coq-10 200 Mg Caps (Coenzyme Q10) .Marland Kitchen.. 1 Once Daily 12)  Align  Caps (Probiotic Product) .Marland Kitchen.. 1 Once Daily 13)  Anti-Oxidant  Tabs (Multiple Vitamin) .Marland Kitchen.. 1 Once Daily 14)  Lotrisone 1-0.05 % Lotn (Clotrimazole-Betamethasone) .... Apply To Rash Two Times A Day 15)  Vitamin D-3 5000 Unit Tabs (Cholecalciferol) .... One By Mouth Daily Otc 16)  Vitamin D3 2000 Unit Caps (Cholecalciferol) .... One By Mouth Daily Otc  Allergies (verified): 1)  ! Lyrica  Past History:  Family History: Last updated: 02/12/2007 Family hx Colorectal CA  Social History: Last updated: 02/12/2007 Married Drug use-no Regular exercise-no Never Smoked  Risk Factors: Exercise: no (02/12/2007)  Risk Factors: Smoking Status: never (01/23/2010)  Past medical, surgical, family and social histories (including risk factors) reviewed, and no changes noted (except as noted below).  Past Medical History: Reviewed history from 05/15/2007 and no changes required. GERD Hypertension Back pain BPH Hyperlipidemia Low back pain Osteoporosis  Past Surgical History: Reviewed history from 02/12/2007 and no changes required. Colonscopy 11/26/2000 Lumbar sx x 2 -2004 Herniorrhapy -1967  Family History: Reviewed history from 02/12/2007 and no changes required. Family hx Colorectal CA  Social History: Reviewed history from 02/12/2007 and no changes  required. Married Drug use-no Regular exercise-no Never Smoked  Review of Systems  The patient denies anorexia, fever, weight loss, weight gain, vision loss, decreased hearing, hoarseness, chest pain, syncope, dyspnea on exertion, peripheral edema, prolonged cough, headaches, hemoptysis, abdominal pain, melena, hematochezia, severe indigestion/heartburn, hematuria, incontinence, genital sores, muscle weakness, suspicious skin lesions, transient blindness, difficulty walking, depression, unusual weight change, abnormal bleeding, enlarged lymph nodes, angioedema, and breast masses.    Physical Exam  General:  Well-developed,well-nourished,in no acute distress; alert,appropriate and cooperative throughout examination Head:  normocephalic and atraumatic.   Eyes:  pupils equal and pupils round.   Ears:  R ear normal and L ear normal.   Nose:  no external deformity, no external erythema, and no nasal discharge.   Mouth:  good dentition and pharynx pink and moist.   Neck:  supple and full ROM.   Lungs:  normal respiratory effort and no intercostal retractions.   Heart:  normal rate and regular  rhythm.   Abdomen:  difuse tenderness in the mid epigastrum normal BS   Rectal:  No external abnormalities noted. Normal sphincter tone. No rectal masses or tenderness. Genitalia:  slight rigfht indirect inginal tenderness Prostate:  no nodules, tender, boggy, and 1+ enlarged.   Skin:  waist line rash and leg raise Cervical Nodes:  No lymphadenopathy noted Axillary Nodes:  No palpable lymphadenopathy   Impression & Recommendations:  Problem # 1:  DISUSE OSTEOPOROSIS (ICD-733.03)  Discussed medication use, applications of heat or ice, and exercises.   Orders: TLB-Calcium (82310-CA) T-Vitamin D (25-Hydroxy) ([address])  Problem # 2:  MYOFASCIAL PAIN SYNDROME (ICD-729.1) dicussion of pain control and synergistic approaches to theis His updated medication list for this problem includes:     Methadone Hcl 5 Mg Tabs (Methadone hcl) .Marland Kitchen... 1 four times a day failed a host of other modalities includng  lyrica, TENs,  Problem # 3:  DISUSE OSTEOPOROSIS (ICD-733.03)  moniter d and calcium  Discussed medication use, applications of heat or ice, and exercises.   Problem # 4:  HYPERTENSION (ICD-401.9)  His updated medication list for this problem includes:    Metoprolol Tartrate 50 Mg Tabs (Metoprolol tartrate) .Marland Kitchen..Marland Kitchen Two times a day    Lotrel 10-20 Mg Caps (Amlodipine besy-benazepril hcl) .Marland Kitchen... Take 1 tablet by mouth once a day  BP today: 138/80 Prior BP: 136/80 (11/22/2009)  Prior 10 Yr Risk Heart Disease: 18 % (02/09/2009)  Labs Reviewed: K+: 3.7 (09/14/2009) Creat: : 1.2 (11/01/2009)   Chol: 178 (02/03/2009)   HDL: 59.70 (02/03/2009)   LDL: 108 (02/03/2009)   TG: 51.0 (02/03/2009)  Problem # 5:  UNS ADVRS EFF UNS RX MEDICINAL&BIOLOGICAL SBSTNC (ICD-995.20) need to r/o steroid induced DM as well as the fungal rash Orders: TLB-BMP (Basic Metabolic Panel-BMET) (80048-METABOL) TLB-A1C / Hgb A1C (Glycohemoglobin) (83036-A1C) Venipuncture (09811)  Complete Medication List: 1)  Multivitamin  .... Take 1 tablet by mouth once a day 2)  Metoprolol Tartrate 50 Mg Tabs (Metoprolol tartrate) .... Two times a day 3)  Lotrel 10-20 Mg Caps (Amlodipine besy-benazepril hcl) .... Take 1 tablet by mouth once a day 4)  Omega 3  .... Take 2 tablet by mouth once a day 5)  Methadone Hcl 5 Mg Tabs (Methadone hcl) .Marland Kitchen.. 1 four times a day 6)  Celexa 20 Mg Tabs (Citalopram hydrobromide) .... Once daily 7)  Red Yeast Rice 600 Mg Caps (Red yeast rice extract) .... 2 once daily 8)  Calcium-magnesium 750-465 Mg Tabs (Calcium-magnesium) .... 2 once daily 9)  Vitamin B-12 50 Mcg Tabs (Cyanocobalamin) .Marland Kitchen.. 1 once daily 10)  Vitamin C 1000 Mg Tabs (Ascorbic acid) .Marland Kitchen.. 1 once daily 11)  Coq-10 200 Mg Caps (Coenzyme q10) .Marland Kitchen.. 1 once daily 12)  Align Caps (Probiotic product) .Marland Kitchen.. 1 once daily 13)   Anti-oxidant Tabs (Multiple vitamin) .Marland Kitchen.. 1 once daily 14)  Lotrisone 1-0.05 % Lotn (Clotrimazole-betamethasone) .... Apply to rash two times a day 15)  Vitamin D-3 5000 Unit Tabs (Cholecalciferol) .... One by mouth daily otc 16)  Vitamin D3 2000 Unit Caps (Cholecalciferol) .... One by mouth daily otc  Patient Instructions: 1)  Please schedule a follow-up appointment in 2 months. Prescriptions: LOTRISONE 1-0.05 % LOTN (CLOTRIMAZOLE-BETAMETHASONE) apply to rash two times a day  #60cc x 1   Entered and Authorized by:   Stacie Glaze MD   Signed by:   Stacie Glaze MD on 01/23/2010   Method used:   Electronically to  [facility] (retail)       [address]       [place], [place]  [address]       Ph: [phone] or [phone]       Fax: [phone]   RxID:   [id number]

## 2010-09-20 NOTE — Assessment & Plan Note (Signed)
Summary: 2 month fup//ccm   Vital Signs:  Patient profile:   65 year old male Height:      69 inches Weight:      154 pounds BMI:     22.82 Temp:     98.2 degrees F oral Pulse rate:   72 / minute Resp:     14 per minute BP sitting:   140 / 84  (left arm)  Vitals Entered By: Willy Eddy, LPN (August 31, 2010 9:33 AM) CC: roa-state pulse faster since ch aning to tribenzor, Hypertension Management Is Patient Diabetic? No   Primary Care Provider:  Darryll Capers, MD   CC:  roa-state pulse faster since ch aning to tribenzor and Hypertension Management.  History of Present Illness: The pt has failed  multiple mobilities for pain control including implatable device ( removed) pain in the groin area persistant but back pain is the worst... due to the CV side effects ( bradycardia)   Hypertension History:      He denies headache, chest pain, palpitations, dyspnea with exertion, orthopnea, PND, peripheral edema, visual symptoms, neurologic problems, syncope, and side effects from treatment.        Positive major cardiovascular risk factors include male age 69 years old or older, hyperlipidemia, and hypertension.  Negative major cardiovascular risk factors include non-tobacco-user status.     Preventive Screening-Counseling & Management  Alcohol-Tobacco     Smoking Status: never     Tobacco Counseling: not indicated; no tobacco use  Current Problems (verified): 1)  Primary Central Sleep Apnea  (ICD-327.21) 2)  Nocturia  (ICD-788.43) 3)  Tremor, Essential, Left Hand and Right  (ICD-333.1) 4)  Hypersomnia, Associated With Sleep Apnea  (ICD-780.53) 5)  Abdominal Pain Right Lower Quadrant  (ICD-789.03) 6)  Tremor, Essential  (ICD-333.1) 7)  Acute Prostatitis  (ICD-601.0) 8)  Spinal Stenosis, Lumbar  (ICD-724.02) 9)  Osteoarthritis, Lower Leg, Left  (ICD-715.36) 10)  Myofascial Pain Syndrome  (ICD-729.1) 11)  Urinary Frequency, Chronic  (ICD-788.41) 12)  Disuse Osteoporosis   (ICD-733.03) 13)  Hypogonadism, Male  (ICD-257.2) 14)  Syncope  (ICD-780.2) 15)  Hypokalemia  (ICD-276.8) 16)  Uns Advrs Eff Uns Rx Medicinal&biological Sbstnc  (ICD-995.20) 17)  Gastritis  (ICD-535.50) 18)  Diverticulosis, Colon  (ICD-562.10) 19)  Colorectal Cancer, Family Hx  (ICD-V16.0) 20)  Constipation, Chronic  (ICD-564.09) 21)  Osteoporosis  (ICD-733.00) 22)  Low Back Pain  (ICD-724.2) 23)  Hyperlipidemia  (ICD-272.4) 24)  Physical Examination  (ICD-V70.0) 25)  Hypertension  (ICD-401.9) 26)  Gerd  (ICD-530.81)  Current Medications (verified): 1)  Multivitamin .... Take 1 Tablet By Mouth Once A Day 2)  Omega 3 .... Take 2 Tablet By Mouth Once A Day 3)  Methadone Hcl 5 Mg Tabs (Methadone Hcl) .Marland Kitchen.. 1 Four Times A Day 4)  Citalopram Hydrobromide 40 Mg Tabs (Citalopram Hydrobromide) .... One By Mouth Daily 5)  Red Yeast Rice 600 Mg Caps (Red Yeast Rice Extract) .... 2 Once Daily 6)  Calcium 650-Magnesium 250 .... 2 Once Daily 7)  Vitamin B-12 50 Mcg Tabs (Cyanocobalamin) .Marland Kitchen.. 1 Once Daily 8)  Vitamin C 1000 Mg Tabs (Ascorbic Acid) .Marland Kitchen.. 1 Once Daily 9)  Coq-10 200 Mg Caps (Coenzyme Q10) .Marland Kitchen.. 1 Once Daily 10)  Align  Caps (Probiotic Product) .Marland Kitchen.. 1 Once Daily 11)  Anti-Oxidant  Tabs (Multiple Vitamin) .Marland Kitchen.. 1 Once Daily 12)  Vitamin D-3 5000 Unit Tabs (Cholecalciferol) .... One By Mouth Daily Otc 13)  Vitamin D3 2000 Unit Caps (Cholecalciferol) .Marland KitchenMarland KitchenMarland Kitchen  One By Mouth Daily Otc 14)  Miralax  Powd (Polyethylene Glycol 3350) .... By Mouth Two Times A Day 15)  Tribenzor 40-5-12.5 Mg Tabs (Olmesartan-Amlodipine-Hctz) .... One By Mouth Daily 16)  Vesicare 5 Mg Tabs (Solifenacin Succinate) .... One By Mouth Q Hs  Allergies (verified): 1)  ! Lyrica  Past History:  Family History: Last updated: 03/20/2010 Family HX Colorectal CA: Mother   Social History: Last updated: 05/21/2010 Retired-Disabled--district sales Married 2 children  Drug use-no Regular exercise-no Never  Smoked  Risk Factors: Exercise: no (02/12/2007)  Risk Factors: Smoking Status: never (08/31/2010)  Past medical, surgical, family and social histories (including risk factors) reviewed, and no changes noted (except as noted below).  Past Medical History: Reviewed history from 05/21/2010 and no changes required.   NOCTURIA (ICD-788.43) TREMOR, ESSENTIAL, LEFT HAND AND RIGHT (ICD-333.1) HYPERSOMNIA, ASSOCIATED WITH SLEEP APNEA (ICD-780.53) ABDOMINAL PAIN RIGHT LOWER QUADRANT (ICD-789.03) TREMOR, ESSENTIAL (ICD-333.1) ACUTE PROSTATITIS (ICD-601.0) SPINAL STENOSIS, LUMBAR (ICD-724.02) OSTEOARTHRITIS, LOWER LEG, LEFT (ICD-715.36) MYOFASCIAL PAIN SYNDROME (ICD-729.1) URINARY FREQUENCY, CHRONIC (ICD-788.41) DISUSE OSTEOPOROSIS (ICD-733.03) HYPOGONADISM, MALE (ICD-257.2) SYNCOPE (ICD-780.2) HYPOKALEMIA (ICD-276.8) HEMARTHORSIS, UPPER ARM (ICD-719.12) UNS ADVRS EFF UNS RX MEDICINAL&BIOLOGICAL SBSTNC (ICD-995.20) GASTRITIS (ICD-535.50) DIVERTICULOSIS, COLON (ICD-562.10) COLORECTAL CANCER, FAMILY HX (ICD-V16.0) CONSTIPATION, CHRONIC (ICD-564.09) OSTEOPOROSIS (ICD-733.00) LOW BACK PAIN (ICD-724.2) HYPERLIPIDEMIA (ICD-272.4) PHYSICAL EXAMINATION (ICD-V70.0) HYPERTENSION (ICD-401.9) GERD (ICD-530.81)    Past Surgical History: Reviewed history from 05/21/2010 and no changes required. Colonscopy 11/26/2000 Lumbar sx x 3 -2004 Herniorrhapy -1967  Family History: Reviewed history from 03/20/2010 and no changes required. Family HX Colorectal CA: Mother   Social History: Reviewed history from 05/21/2010 and no changes required. 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    Physical Exam  General:  alert and pale.   Head:  normocephalic and atraumatic.   Eyes:  pupils equal and pupils round.   Ears:  R ear normal and L ear normal.   Nose:  no external deformity and no nasal discharge.   Mouth:  good dentition and pharynx pink and moist.   Neck:  supple and full ROM.   Lungs:  normal respiratory effort and no intercostal retractions.   Heart:  normal rate and regular rhythm.     Impression & Recommendations:  Problem # 1:  OSTEOARTHRITIS, LOWER LEG, LEFT (ICD-715.36) Assessment Deteriorated  His updated medication list for this problem includes:    Methadone Hcl 5 Mg Tabs (Methadone hcl) .Marland Kitchen... 1 four times a day  Discussed use of medications, application of heat or cold, and exercises.   Problem # 2:  MYOFASCIAL PAIN SYNDROME (ICD-729.1) Assessment: Deteriorated the savalla did not work so the pt wasnt to resume the prior medicatiosn His updated medication list for this problem includes:    Methadone Hcl 5 Mg Tabs (Methadone hcl) .Marland Kitchen... 1 four times a day  Problem # 3:  CONSTIPATION, CHRONIC (ICD-564.09) Assessment: Deteriorated  His updated medication list for this problem includes:    Miralax Powd (Polyethylene glycol 3350) ..... By mouth two times a day  Discussed dietary fiber measures and increased water intake.   Complete Medication List: 1)  Multivitamin  .Marland KitchenMarland KitchenMarland Kitchen  Take 1 tablet by mouth once a day 2)  Omega 3  .... Take 2 tablet by mouth once a day 3)  Methadone Hcl 5 Mg Tabs (Methadone hcl) .Marland Kitchen.. 1 four times a day 4)  Citalopram Hydrobromide 40 Mg Tabs (Citalopram hydrobromide) .... One by mouth daily 5)  Red Yeast Rice 600 Mg Caps (Red yeast rice extract) .... 2 once  daily 6)  Calcium 650-magnesium 250  .... 2 once daily 7)  Vitamin B-12 50 Mcg Tabs (Cyanocobalamin) .Marland Kitchen.. 1 once daily 8)  Vitamin C 1000 Mg Tabs (Ascorbic acid) .Marland Kitchen.. 1 once daily 9)  Coq-10 200 Mg Caps (Coenzyme q10) .Marland Kitchen.. 1 once daily 10)  Align Caps (Probiotic product) .Marland Kitchen.. 1 once daily 11)  Anti-oxidant Tabs (Multiple vitamin) .Marland Kitchen.. 1 once daily 12)  Vitamin D-3 5000 Unit Tabs (Cholecalciferol) .... One by mouth daily otc 13)  Vitamin D3 2000 Unit Caps (Cholecalciferol) .... One by mouth daily otc 14)  Miralax Powd (Polyethylene glycol 3350) .... By mouth two times a day 15)  Tribenzor 40-5-12.5 Mg Tabs (Olmesartan-amlodipine-hctz) .... One by mouth daily 16)  Vesicare 5 Mg Tabs (Solifenacin succinate) .... One by mouth q hs  Hypertension Assessment/Plan:      The patient's hypertensive risk group is category B: At least one risk factor (excluding diabetes) with no target organ damage.  His calculated 10 year risk of coronary heart disease is 22 %.  Today's blood pressure is 140/84.  His blood pressure goal is < 140/90.  Patient Instructions: 1)  discuss Butrans patches with Dr Vear Clock for pain control? 2)  The pusle issue has resolved with the change in BP medications so we are not opposed to increasing the methadone is needed 3)  Please schedule a follow-up appointment in 3 months. Prescriptions: CITALOPRAM HYDROBROMIDE 40 MG TABS (CITALOPRAM HYDROBROMIDE) one by mouth daily  #30 x 11   Entered and Authorized by:   Stacie Glaze MD   Signed by:   Stacie Glaze MD on 08/31/2010   Method used:   Electronically to        [id number]       [address]       [place], [place]  [address]       Ph: [phone] or [phone]       Fax: [phone]   RxID:   [id number]    Orders Added: 1)  Est. Patient Level IV [66063]

## 2010-09-20 NOTE — Letter (Signed)
Summary: Generic Letter  [place]  [address] [place], [place] [address]   Phone: [phone]  Fax: [phone]    11/22/2009  Georges Streeper [address] [place], [place]  [address]  Dear Mr. Calvi,  This is to document that is it safe for you to participate in moderate phjysical activity in a "gym" setting with appropriate machines and Sterne weights.     Sincerely,   Darryll Capers MD

## 2010-09-20 NOTE — Assessment & Plan Note (Signed)
Summary: consult for central/obstr. sleep apnea   Visit Type:  Initial Consult Copy to:  Darryll Capers Primary Orland Visconti/Referring Aaima Gaddie:  Darryll Capers, MD   CC:  sleep consult. Marland Kitchen  History of Present Illness: The pt is a 64y/o male who I have been asked to see for sleep apnea.  He recently underwent NPSG where he was found to have an AHI of 21/hr with desat to 89%.  The majority of his events were central in nature, but he did have obstructive events as well.  The pt goes to bed around 11pm, and arises at 7am to start his day.  He awakens at least 6-10 times a night, and is not rested upon arising in the am's.  He feels that he is very tired during the day, and his wife states that he will fall asleep if he sits down for any period of time.  He takes naps 1-2 times a day consistently.  The pt does not drive, but does get sleepy as a passenger.  His weight has been neutral over the last few years.  His epworth score at the time of his sleep study was 9.  The pt denies any h/o CVA or head trauma, and does not have a known cardiomyopathy.  Problems Prior to Update: 1)  Primary Central Sleep Apnea  (ICD-327.21) 2)  Obstructive Sleep Apnea  (ICD-327.23) 3)  Nocturia  (ICD-788.43) 4)  Tremor, Essential, Left Hand and Right  (ICD-333.1) 5)  Hypersomnia, Associated With Sleep Apnea  (ICD-780.53) 6)  Abdominal Pain Right Lower Quadrant  (ICD-789.03) 7)  Tremor, Essential  (ICD-333.1) 8)  Acute Prostatitis  (ICD-601.0) 9)  Spinal Stenosis, Lumbar  (ICD-724.02) 10)  Osteoarthritis, Lower Leg, Left  (ICD-715.36) 11)  Myofascial Pain Syndrome  (ICD-729.1) 12)  Urinary Frequency, Chronic  (ICD-788.41) 13)  Disuse Osteoporosis  (ICD-733.03) 14)  Hypogonadism, Male  (ICD-257.2) 15)  Syncope  (ICD-780.2) 16)  Hypokalemia  (ICD-276.8) 17)  Hemarthorsis, Upper Arm  (ICD-719.12) 18)  Uns Advrs Eff Uns Rx Medicinal&biological Sbstnc  (ICD-995.20) 19)  Gastritis  (ICD-535.50) 20)  Diverticulosis, Colon   (ICD-562.10) 21)  Colorectal Cancer, Family Hx  (ICD-V16.0) 22)  Constipation, Chronic  (ICD-564.09) 23)  Osteoporosis  (ICD-733.00) 24)  Low Back Pain  (ICD-724.2) 25)  Hyperlipidemia  (ICD-272.4) 26)  Physical Examination  (ICD-V70.0) 27)  Hypertension  (ICD-401.9) 28)  Gerd  (ICD-530.81)  Current Medications (verified): 1)  Multivitamin .... Take 1 Tablet By Mouth Once A Day 2)  Omega 3 .... Take 2 Tablet By Mouth Once A Day 3)  Methadone Hcl 5 Mg Tabs (Methadone Hcl) .Marland Kitchen.. 1 Four Times A Day 4)  Savella 50 Mg Tabs (Milnacipran Hcl) .... One By Mouth  Two Times A Day 5)  Red Yeast Rice 600 Mg Caps (Red Yeast Rice Extract) .... 2 Once Daily 6)  Calcium 650-Magnesium 250 .... 2 Once Daily 7)  Vitamin B-12 50 Mcg Tabs (Cyanocobalamin) .Marland Kitchen.. 1 Once Daily 8)  Vitamin C 1000 Mg Tabs (Ascorbic Acid) .Marland Kitchen.. 1 Once Daily 9)  Coq-10 200 Mg Caps (Coenzyme Q10) .Marland Kitchen.. 1 Once Daily 10)  Align  Caps (Probiotic Product) .Marland Kitchen.. 1 Once Daily 11)  Anti-Oxidant  Tabs (Multiple Vitamin) .Marland Kitchen.. 1 Once Daily 12)  Vitamin D-3 5000 Unit Tabs (Cholecalciferol) .... One By Mouth Daily Otc 13)  Vitamin D3 2000 Unit Caps (Cholecalciferol) .... One By Mouth Daily Otc 14)  Miralax  Powd (Polyethylene Glycol 3350) .... By Mouth Once Daily 15)  Tribenzor 40-5-12.5 Mg  Tabs (Olmesartan-Amlodipine-Hctz) .... One By Mouth Daily 16)  Vesicare 5 Mg Tabs (Solifenacin Succinate) .... One By Mouth Q Hs  Allergies (verified): 1)  ! Lyrica  Past History:  Family History: Last updated: 03/20/2010 Family HX Colorectal CA: Mother   Past Medical History:   NOCTURIA (ICD-788.43) TREMOR, ESSENTIAL, LEFT HAND AND RIGHT (ICD-333.1) HYPERSOMNIA, ASSOCIATED WITH SLEEP APNEA (ICD-780.53) ABDOMINAL PAIN RIGHT LOWER QUADRANT (ICD-789.03) TREMOR, ESSENTIAL (ICD-333.1) ACUTE PROSTATITIS (ICD-601.0) SPINAL STENOSIS, LUMBAR (ICD-724.02) OSTEOARTHRITIS, LOWER LEG, LEFT (ICD-715.36) MYOFASCIAL PAIN SYNDROME (ICD-729.1) URINARY  FREQUENCY, CHRONIC (ICD-788.41) DISUSE OSTEOPOROSIS (ICD-733.03) HYPOGONADISM, MALE (ICD-257.2) SYNCOPE (ICD-780.2) HYPOKALEMIA (ICD-276.8) HEMARTHORSIS, UPPER ARM (ICD-719.12) UNS ADVRS EFF UNS RX MEDICINAL&BIOLOGICAL SBSTNC (ICD-995.20) GASTRITIS (ICD-535.50) DIVERTICULOSIS, COLON (ICD-562.10) COLORECTAL CANCER, FAMILY HX (ICD-V16.0) CONSTIPATION, CHRONIC (ICD-564.09) OSTEOPOROSIS (ICD-733.00) LOW BACK PAIN (ICD-724.2) HYPERLIPIDEMIA (ICD-272.4) PHYSICAL EXAMINATION (ICD-V70.0) HYPERTENSION (ICD-401.9) GERD (ICD-530.81)    Past Surgical History: Colonscopy 11/26/2000 Lumbar sx x 3 -2004 Herniorrhapy -1967  Family History: Reviewed history from 03/20/2010 and no changes required. Family HX Colorectal CA: Mother   Social History: Reviewed history from 03/20/2010 and no changes required. Retired-Disabled--district sales Married 2 children  Drug use-no Regular exercise-no Never Smoked  Review of Systems       The patient complains of acid heartburn, indigestion, abdominal pain, anxiety, hand/feet swelling, and joint stiffness or pain.  The patient denies shortness of breath with activity, shortness of breath at rest, productive cough, non-productive cough, coughing up blood, chest pain, irregular heartbeats, loss of appetite, weight change, difficulty swallowing, sore throat, tooth/dental problems, headaches, nasal congestion/difficulty breathing through nose, sneezing, itching, ear ache, depression, rash, change in color of mucus, and fever.    Vital Signs:  Patient profile:   65 year old male Height:      69 inches Weight:      154.25 pounds BMI:     22.86 O2 Sat:      98 % on Room air Temp:     98.3 degrees F oral Pulse rate:   50 / minute BP sitting:   124 / 76  (right arm) Cuff size:   regular  Vitals Entered By: Carver Fila (May 21, 2010 11:25 AM)  O2 Flow:  Room air CC: sleep consult.  Comments meds and allergies updated Phone number updated Carver Fila  May 21, 2010 11:25 AM    Physical Exam  General:  thin male in nad Eyes:  PERRLA and EOMI.   Nose:  moderate desat to left with partial obstruction Mouth:  mild elongation of uvula, but fairly normal palate Neck:  no jvd, tmg, LN Lungs:  clear to auscultation Heart:  rrr, no mrg Abdomen:  soft and nontender,bs+ Extremities:  no edema or cyanosis, pulses intact distally Neurologic:  alert and oriented, moves all 4. appears mildly sleepy.   Impression & Recommendations:  Problem # 1:  PRIMARY CENTRAL SLEEP APNEA (ICD-327.21) the pt has complex apnea with central events >> obstructive component, and he clearly is having signficant sleep disruption and daytime sleepiness that is affecting his QOL.  His type and degree of sleep apnea is not really a significant health risk for him.  I have had a long discussion with the pt about his various treatment options.  One is to concentrate primarily on the central component, and to treat with supplemental oxygen.  However, I am not sure this will adequately improve his symptoms.  The other option is to treat with cpap, which will treat his central and obstructive components.  The  pt agrees to try cpap, and understands that he will require a formal titration study at the sleep center if we are going to stick with this treatment (pressure induced worsening of central apneas is common).    Other Orders: Consultation Level IV (16109) DME Referral (DME) Sleep Disorder Referral (Sleep Disorder)  Patient Instructions: 1)  will start on cpap at a low pressure level to allow for desensitization.  Will set up for study at sleep center to adjust your pressure accurately for your mixed apnea. 2)  please call if you have tolerance issues with cpap 3)  will arrange followup we me once your titration study is done.

## 2010-09-20 NOTE — Assessment & Plan Note (Signed)
Summary: 6 wk rov/njr   Vital Signs:  Patient profile:   65 year old male Height:      69 inches Weight:      146 pounds BMI:     21.64 Temp:     98.2 degrees F oral Pulse rate:   64 / minute Resp:     14 per minute BP sitting:   146 / 80  (left arm)  Vitals Entered By: Willy Eddy, LPN (October 27, 2009 10:27 AM) CC: roa- to spine specialist at [facility]   CC:  roa- to spine specialist at [facility].  History of Present Illness: having a bad day with pain and feeling tired discussion of funsion with the [facility] physicians ... due to the multilevel fusion needed, the consultant did not recomment surgical intervention last pain shot was in 2004 consideration for referral to [facility] radiology pt [facility]25.2010 [facility] pt has MRI in the past through  [place] secondary caused of osteoprosis  reviewed  Preventive Screening-Counseling & Management  Alcohol-Tobacco     Smoking Status: never  Current Problems (verified): 1)  Acute Prostatitis  (ICD-601.0) 2)  Spinal Stenosis, Lumbar  (ICD-724.02) 3)  Osteoarthritis, Lower Leg, Left  (ICD-715.36) 4)  Myofascial Pain Syndrome  (ICD-729.1) 5)  Urinary Frequency, Chronic  (ICD-788.41) 6)  Disuse Osteoporosis  (ICD-733.03) 7)  Hypogonadism, Male  (ICD-257.2) 8)  Syncope  (ICD-780.2) 9)  Hypokalemia  (ICD-276.8) 10)  Hemarthorsis, Upper Arm  (ICD-719.12) 11)  Uns Advrs Eff Uns Rx Medicinal&biological Sbstnc  (ICD-995.20) 12)  Gastritis  (ICD-535.50) 13)  Diverticulosis, Colon  (ICD-562.10) 14)  Colorectal Cancer, Family Hx  (ICD-V16.0) 15)  Constipation, Chronic  (ICD-564.09) 16)  Osteoporosis  (ICD-733.00) 17)  Low Back Pain  (ICD-724.2) 18)  Hyperlipidemia  (ICD-272.4) 19)  Physical Examination  (ICD-V70.0) 20)  Hypertension  (ICD-401.9) 21)  Gerd  (ICD-530.81)  Current Medications (verified): 1)  Multivitamin .... Take 1 Tablet By Mouth Once A Day 2)  Metoprolol Tartrate 50 Mg  Tabs (Metoprolol Tartrate) .... Two  Times A Day 3)  Lotrel 10-20 Mg  Caps (Amlodipine Besy-Benazepril Hcl) .... Take 1 Tablet By Mouth Once A Day 4)  Omega 3 .... Take 2 Tablet By Mouth Once A Day 5)  Methadone Hcl 5 Mg Tabs (Methadone Hcl) .Marland Kitchen.. 1 Four Times A Day 6)  Celexa 20 Mg  Tabs (Citalopram Hydrobromide) .... Once Daily 7)  Vitamin D 04540 Unit  Caps (Ergocalciferol) .... One By Mouth Weekly 8)  Red Yeast Rice 600 Mg Caps (Red Yeast Rice Extract) .... 2 Once Daily 9)  Calcium-Magnesium 750-465 Mg Tabs (Calcium-Magnesium) .... 2 Once Daily 10)  Dhea 25 Mg Tabs (Prasterone (Dhea)) .Marland Kitchen.. 1 Once Daily 11)  Vitamin B-12 50 Mcg Tabs (Cyanocobalamin) .Marland Kitchen.. 1 Once Daily 12)  Vitamin C 1000 Mg Tabs (Ascorbic Acid) .Marland Kitchen.. 1 Once Daily 13)  Coq-10 200 Mg Caps (Coenzyme Q10) .Marland Kitchen.. 1 Once Daily 14)  Align  Caps (Probiotic Product) .Marland Kitchen.. 1 Once Daily 15)  Anti-Oxidant  Tabs (Multiple Vitamin) .Marland Kitchen.. 1 Once Daily  Allergies (verified): 1)  ! Lyrica  Past History:  Family History: Last updated: 02/12/2007 Family hx Colorectal CA  Social History: Last updated: 02/12/2007 Married Drug use-no Regular exercise-no Never Smoked  Risk Factors: Exercise: no (02/12/2007)  Risk Factors: Smoking Status: never (10/27/2009)  Past medical, surgical, family and social histories (including risk factors) reviewed, and no changes noted (except as noted below).  Past Medical History: Reviewed history from 05/15/2007  and no changes required. GERD Hypertension Back pain BPH Hyperlipidemia Low back pain Osteoporosis  Past Surgical History: Reviewed history from 02/12/2007 and no changes required. Colonscopy 11/26/2000 Lumbar sx x 2 -2004 Herniorrhapy -1967  Family History: Reviewed history from 02/12/2007 and no changes required. Family hx Colorectal CA  Social History: Reviewed history from 02/12/2007 and no changes required. Married Drug use-no Regular exercise-no Never Smoked  Review of Systems  The patient denies  anorexia, fever, weight loss, weight gain, vision loss, decreased hearing, hoarseness, chest pain, syncope, dyspnea on exertion, peripheral edema, prolonged cough, headaches, hemoptysis, abdominal pain, melena, hematochezia, severe indigestion/heartburn, hematuria, incontinence, genital sores, muscle weakness, suspicious skin lesions, transient blindness, difficulty walking, depression, unusual weight change, abnormal bleeding, enlarged lymph nodes, angioedema, and breast masses.    Physical Exam  General:  Well-developed,well-nourished,in no acute distress; alert,appropriate and cooperative throughout examination Head:  normocephalic and atraumatic.   Eyes:  pupils equal and pupils round.   Ears:  R ear normal and L ear normal.   Nose:  no external deformity, no external erythema, and no nasal discharge.   Mouth:  good dentition and pharynx pink and moist.   Neck:  supple and full ROM.   Lungs:  normal respiratory effort and no intercostal retractions.   Heart:  normal rate and regular rhythm.   Abdomen:  difuse tenderness in the mid epigastrum normal BS     Impression & Recommendations:  Problem # 1:  LOW BACK PAIN (ICD-724.2) referral to interventioal radiology for pain injectons had a consult a [facility] indicating that surgery was not an option  His updated medication list for this problem includes:    Methadone Hcl 5 Mg Tabs (Methadone hcl) .Marland Kitchen... 1 four times a day  Discussed use of moist heat or ice, modified activities, medications, and stretching/strengthening exercises. Back care instructions given. To be seen in 2 weeks if no improvement; sooner if worsening of symptoms.   Problem # 2:  DISUSE OSTEOPOROSIS (ICD-733.03) had been on actonil for 18 months, had increaswed pain The following medications were removed from the medication list:    Actonel 150 Mg Tabs (Risedronate sodium) ..... One by mouth monthy as direted.  Discussed medication use, applications of heat or ice, and  exercises.  failed the actionil due to side fets will refer for reclast  Problem # 3:  CONSTIPATION, CHRONIC (ICD-564.09) on miralax and metamucil senna works best Discussed dietary fiber measures and increased water intake.   Problem # 4:  HYPERLIPIDEMIA (ICD-272.4)  Orders: Radiology Referral (Radiology)  Labs Reviewed: SGOT: 19 (02/03/2009)   SGPT: 18 (02/03/2009)  Lipid Goals: Chol Goal: 200 (10/05/2007)   HDL Goal: 40 (10/05/2007)   LDL Goal: 160 (10/05/2007)   TG Goal: 150 (10/05/2007)  Prior 10 Yr Risk Heart Disease: 18 % (02/09/2009)   HDL:59.70 (02/03/2009), 61.5 (11/26/2007)  LDL:108 (02/03/2009), DEL (11/26/2007)  Chol:178 (02/03/2009), 209 (11/26/2007)  Trig:51.0 (02/03/2009), 70 (11/26/2007)  Complete Medication List: 1)  Multivitamin  .... Take 1 tablet by mouth once a day 2)  Metoprolol Tartrate 50 Mg Tabs (Metoprolol tartrate) .... Two times a day 3)  Lotrel 10-20 Mg Caps (Amlodipine besy-benazepril hcl) .... Take 1 tablet by mouth once a day 4)  Omega 3  .... Take 2 tablet by mouth once a day 5)  Methadone Hcl 5 Mg Tabs (Methadone hcl) .Marland Kitchen.. 1 four times a day 6)  Celexa 20 Mg Tabs (Citalopram hydrobromide) .... Once daily 7)  Vitamin D 40981 Unit Caps (Ergocalciferol) .Marland KitchenMarland KitchenMarland Kitchen  One by mouth weekly 8)  Red Yeast Rice 600 Mg Caps (Red yeast rice extract) .... 2 once daily 9)  Calcium-magnesium 750-465 Mg Tabs (Calcium-magnesium) .... 2 once daily 10)  Dhea 25 Mg Tabs (Prasterone (dhea)) .Marland Kitchen.. 1 once daily 11)  Vitamin B-12 50 Mcg Tabs (Cyanocobalamin) .Marland Kitchen.. 1 once daily 12)  Vitamin C 1000 Mg Tabs (Ascorbic acid) .Marland Kitchen.. 1 once daily 13)  Coq-10 200 Mg Caps (Coenzyme q10) .Marland Kitchen.. 1 once daily 14)  Align Caps (Probiotic product) .Marland Kitchen.. 1 once daily 15)  Anti-oxidant Tabs (Multiple vitamin) .Marland Kitchen.. 1 once daily  Other Orders: Misc. Referral (Misc. Ref)  Patient Instructions: 1)  Please schedule a follow-up appointment in 1 month.

## 2010-11-05 ENCOUNTER — Encounter: Payer: Self-pay | Admitting: Pulmonary Disease

## 2010-11-05 ENCOUNTER — Ambulatory Visit (INDEPENDENT_AMBULATORY_CARE_PROVIDER_SITE_OTHER): Payer: 59 | Admitting: Pulmonary Disease

## 2010-11-05 DIAGNOSIS — G4731 Primary central sleep apnea: Secondary | ICD-10-CM

## 2010-11-12 ENCOUNTER — Other Ambulatory Visit: Payer: Self-pay | Admitting: Pulmonary Disease

## 2010-11-12 DIAGNOSIS — G4731 Primary central sleep apnea: Secondary | ICD-10-CM

## 2010-11-14 ENCOUNTER — Other Ambulatory Visit: Payer: Self-pay | Admitting: *Deleted

## 2010-11-14 DIAGNOSIS — R351 Nocturia: Secondary | ICD-10-CM

## 2010-11-14 MED ORDER — SOLIFENACIN SUCCINATE 5 MG PO TABS: 5.0000 mg | ORAL_TABLET | Freq: Every day | ORAL | Status: DC

## 2010-11-15 NOTE — Assessment & Plan Note (Signed)
Summary: ov to discuss being on bilevel/ per kc/mg   Copy to:  Darryll Capers Primary Provider/Referring Provider:  Darryll Capers, MD   CC:  overdue f/u appt for OSA.  Last seen Dec 2011.  Pt states he wears his cpap machine every night but "not all night long"  d/t getting tangled up in the hose and mask losing suction.  Pt states he hasn't noticed a change in his energy level since being on bipap.  still "tired" throughout the day. Marland Kitchen  History of Present Illness: the pt comes in today for f/u of his primary central sleep apnea.  We have been trying to treat him with bilevel, and he feels he has given this a fair trial.  He thinks he has worn at least 5 hrs a night almost eveynight, and really has not seen improved sleep or daytime alertness/energy level.  We have checked with insurance and dme, and they are not willing to cover oxygen therapy, an accepted treatment for CSA.    Current Medications (verified): 1)  Multivitamin .... Take 1 Tablet By Mouth Once A Day 2)  Omega 3 .... Take 2 Tablet By Mouth Once A Day 3)  Methadone Hcl 5 Mg Tabs (Methadone Hcl) .Marland Kitchen.. 1 Four Times A Day 4)  Citalopram Hydrobromide 40 Mg Tabs (Citalopram Hydrobromide) .... One By Mouth Daily 5)  Red Yeast Rice 600 Mg Caps (Red Yeast Rice Extract) .... 2 Once Daily 6)  Calcium 650-Magnesium 250 .... 2 Once Daily 7)  Vitamin B-12 50 Mcg Tabs (Cyanocobalamin) .Marland Kitchen.. 1 Once Daily 8)  Vitamin C 1000 Mg Tabs (Ascorbic Acid) .Marland Kitchen.. 1 Once Daily 9)  Coq-10 200 Mg Caps (Coenzyme Q10) .Marland Kitchen.. 1 Once Daily 10)  Align  Caps (Probiotic Product) .Marland Kitchen.. 1 Once Daily 11)  Anti-Oxidant  Tabs (Multiple Vitamin) .Marland Kitchen.. 1 Once Daily 12)  Vitamin D-3 5000 Unit Tabs (Cholecalciferol) .... One By Mouth Daily Otc 13)  Vitamin D3 2000 Unit Caps (Cholecalciferol) .... One By Mouth Daily Otc 14)  Miralax  Powd (Polyethylene Glycol 3350) .... By Mouth Two Times A Day 15)  Tribenzor 40-5-12.5 Mg Tabs (Olmesartan-Amlodipine-Hctz) .... One By Mouth  Daily 16)  Vesicare 5 Mg Tabs (Solifenacin Succinate) .... One By Mouth Q Hs 17)  Glucosamine-Chondroitin 500-400 Mg Caps (Glucosamine-Chondroitin) .... Take 4 Tabs By Mouth Daily  Allergies (verified): 1)  ! Lyrica  Review of Systems       The patient complains of acid heartburn, indigestion, abdominal pain, and joint stiffness or pain.  The patient denies shortness of breath with activity, shortness of breath at rest, productive cough, non-productive cough, coughing up blood, chest pain, irregular heartbeats, loss of appetite, weight change, difficulty swallowing, sore throat, tooth/dental problems, headaches, nasal congestion/difficulty breathing through nose, sneezing, itching, ear ache, anxiety, depression, hand/feet swelling, rash, change in color of mucus, and fever.    Vital Signs:  Patient profile:   65 year old male Height:      69 inches Weight:      152 pounds BMI:     22.53 O2 Sat:      99 % on Room air Temp:     98.2 degrees F oral Pulse rate:   62 / minute BP sitting:   130 / 72  (left arm) Cuff size:   regular  Vitals Entered By: Arman Filter LPN (November 05, 2010 10:01 AM)  O2 Flow:  Room air CC: overdue f/u appt for OSA.  Last seen Dec 2011.  Pt states  he wears his cpap machine every night but "not all night long"  d/t getting tangled up in the hose and mask losing suction.  Pt states he hasn't noticed a change in his energy level since being on bipap.  still "tired" throughout the day.  Comments Medications reviewed with patient Arman Filter LPN  November 05, 2010 10:01 AM    Physical Exam  General:  thin male in nad  Nose:  no skin breakdown or pressure necrosis from cpap mask  Extremities:  no edema or cyanosis  Neurologic:  alert, does not appear sleepy, moves all 4    Impression & Recommendations:  Problem # 1:  PRIMARY CENTRAL SLEEP APNEA (ICD-327.21) the pt has not seen a difference wearing a positive pressure device at least 4-5 hrs a night most  nights.  Therefore, I think this is of limited utility for him.  Oxygen is considered a viable treatment option for central sleep apnea, but the insurance company is not accepting of this unless he has qualifying desats.  Will therefore check ONO to see if this occurs.  Will also go ahead and d/c his bilevel.    Medications Added to Medication List This Visit: 1)  Glucosamine-chondroitin 500-400 Mg Caps (Glucosamine-chondroitin) .... Take 4 tabs by mouth daily  Other Orders: Est. Patient Level III (04540) DME Referral (DME)  Patient Instructions: 1)  will check oxygen levels overnight, and let you know the results. 2)  will discontinue cpap

## 2010-11-20 ENCOUNTER — Encounter: Payer: Self-pay | Admitting: Internal Medicine

## 2010-11-22 ENCOUNTER — Encounter: Payer: Self-pay | Admitting: Pulmonary Disease

## 2010-11-30 ENCOUNTER — Ambulatory Visit: Payer: Self-pay | Admitting: Internal Medicine

## 2010-12-04 ENCOUNTER — Other Ambulatory Visit: Payer: Self-pay | Admitting: *Deleted

## 2010-12-04 MED ORDER — CITALOPRAM HYDROBROMIDE 40 MG PO TABS: 40.0000 mg | ORAL_TABLET | Freq: Every day | ORAL | Status: DC

## 2010-12-31 ENCOUNTER — Telehealth: Payer: Self-pay | Admitting: Pulmonary Disease

## 2010-12-31 NOTE — Telephone Encounter (Signed)
Spoke with pt.  He states that ever since he stopped CPAP and is using on o2 at hs he is still very tired during the day.  He states that it is more comfortable to sleep with, but still wakes up several times at night and is tired all day.  Pls advise thanks!

## 2010-12-31 NOTE — Telephone Encounter (Signed)
Does he feel he did enough better on bipap that he would go back to that?  If he does, I would work with him to try and adjust further.

## 2010-12-31 NOTE — Telephone Encounter (Signed)
Pt says he doesn't think there was a big difference while on bipap but his spouse says there was and he is willing to try bipap again if [name] agrees.

## 2011-01-01 ENCOUNTER — Other Ambulatory Visit: Payer: Self-pay | Admitting: Pulmonary Disease

## 2011-01-01 DIAGNOSIS — G4731 Primary central sleep apnea: Secondary | ICD-10-CM

## 2011-01-01 NOTE — Telephone Encounter (Signed)
Lets try bipap again at 12/8 pressure ( a little higher). Will send note to [facility].

## 2011-01-01 NOTE — Letter (Signed)
February 09, 2007    Mark L. Vear Clock, M.D.  [address], [address]  [place], [place] [address]   RE:  RICE, WALSH  MRN:  [id number]  /  DOB:  1945/11/20   Dear Dr. Vear Clock:   Upon your kind referral, I had the pleasure of evaluating your patient  and I am pleased to offer my findings.  I saw Mr. Viglione in the office  today.  Enclosed is a copy of my progress note that details my findings  and recommendations.   Thank you for the opportunity to participate in your patient's care.    Sincerely,      Barbette Hair. Arlyce Dice, MD,FACG  Electronically Signed    RDK/MedQ  DD: 02/09/2007  DT: 02/09/2007  Job #: [id number]

## 2011-01-01 NOTE — Telephone Encounter (Signed)
[id number]

## 2011-01-01 NOTE — Assessment & Plan Note (Signed)
[facility]                         GASTROENTEROLOGY OFFICE NOTE   NAME:Tyndall, DUILIO HERITAGE                        MRN:          [id number]  DATE:02/09/2007                            DOB:          [birth year]    REASON:  [name] is a pleasant 65 year old white male referred through  the courtesy of Dr. Vear Clock for evaluation.  Over the past 10 months he  has been suffering from mid epigastric abdominal pain.  He often will  awaken with pain.  It is lessened with OxyContin and returns as the  OxyContin wears off.  Pain is described as a squeezing and sometimes  tight stabbing pain that could be severe.  It is unaffected by eating or  moving his bowels.  He has mild nausea with the pain.  He denies  pyrosis.  There is no history of dysphagia.  He also suffers from  chronic constipation coincident with his OxyContin.  He is on OxyContin  twice a day for chronic back pain.  He suffers from constipation and has  a bowel movement about every other day.  He has lost 30 pounds over the  past year, again coincident with his OxyContin.  Appetite is poor.  He  has undergone an ultrasound and abdominal CT and workup for his weight  loss.  Hepatic cysts were seen.  There were no other significant  abnormalities to explain his weight loss.  He is on no other gastric  irritants including nonsteroidals.   FAMILY HISTORY:  Pertinent for a mother who had colon cancer at 46.   PAST MEDICAL HISTORY:  Pertinent for:  1. Hypertension.  2. Depression.   FAMILY HISTORY:  Is as stated above.   MEDICATIONS:  Lotrel, Toprol, Celexa, OxyContin.   He has no allergies.  He does not smoke.  He rarely drinks.  He is  married and is on disability.   REVIEW OF SYSTEMS:  Positive for severe chronic lower back pain.   PHYSICAL EXAMINATION:  Pulse 56, blood pressure 130/68, weight 128.  HEENT: EOMI. PERRLA. Sclerae are anicteric.  Conjunctivae are pink.  NECK:  Supple without  thyromegaly, adenopathy or carotid bruits.  CHEST:  Clear to auscultation and percussion without adventitious  sounds.  CARDIAC:  Regular rhythm; normal S1 S2.  There are no murmurs, gallops  or rubs.  ABDOMEN:  Bowel sounds are normoactive.  Abdomen is soft, non-tender and  non-distended.  There are no abdominal masses, tenderness, splenic  enlargement or hepatomegaly.  EXTREMITIES:  Full range of motion.  No cyanosis, clubbing or edema.  RECTAL:  Deferred.   IMPRESSION:  1. Chronic recurrent mid epigastric pain.  This could be due to ulcer      or non-ulcer dyspepsia.  This may also be related to chronic      constipation.  There is no evidence for pancreatic lesions or other      abnormalities by CT.  2. Family history of colorectal carcinoma.   RECOMMENDATION:  1. Upper endoscopy.  2. Colonoscopy.  3. To consider bowel regimen to ameliorate his chronic constipation,  especially if no abnormalities are seen on the aforementioned      tests.     Barbette Hair. Arlyce Dice, MD,FACG  Electronically Signed    RDK/MedQ  DD: 02/09/2007  DT: 02/09/2007  Job #: [id number]   cc:   Loraine Leriche L. Vear Clock, M.D.  Stacie Glaze, MD

## 2011-01-01 NOTE — Assessment & Plan Note (Signed)
[facility]                         GASTROENTEROLOGY OFFICE NOTE   NAME:[name], [name] TRAIN                        MRN:          [id number]  DATE:03/30/2007                            DOB:          [birth year]    PROBLEM:  Constipation.   [name] has returned for scheduled followup.  Colonoscopy demonstrated  melanosis coli and diverticulosis.  On a regimen of lactulose 30 cc  twice a day, constipation continues to be a problem.  With constipation,  he has abdominal discomfort.  He is taking MiraLax in the past without  good results.   EXAM:  Pulse 48, blood pressure 120/74, weight 133.   IMPRESSION:  1. Chronic constipation related to narcotic use.  2. Family history of colorectal cancer.  3. Diverticulosis.   RECOMMENDATIONS:  1. Trial of Amitiza 24 mcg twice a day while continuing on lactulose.  2. Followup colonoscopy in approximately 5 years.     Barbette Hair. Arlyce Dice, MD,FACG  Electronically Signed    RDK/MedQ  DD: 03/30/2007  DT: 03/31/2007  Job #: [id number]   cc:   Loraine Leriche L. Vear Clock, M.D.

## 2011-01-02 NOTE — Telephone Encounter (Signed)
Called, spoke with pt.  He was informed of KC's recs.  He verbalized understanding of this and is ok with this plan.  Pt aware someone will be contacting him to set this up.

## 2011-01-04 NOTE — Op Note (Signed)
NAME:  [name], [name]                           ACCOUNT NO.:  [id number]   MEDICAL RECORD NO.:  [id number]                   PATIENT TYPE:  OIB   LOCATION:  NA                                   FACILITY:  [facility]   PHYSICIAN:  Sharolyn Douglas, M.D.                     DATE OF BIRTH:  20-Apr-1946   DATE OF PROCEDURE:  03/02/2003  DATE OF DISCHARGE:                                 OPERATIVE REPORT   DIAGNOSIS:  L3-L4 herniated nucleus pulposus and L4-L5 lateral recess  stenosis with left lower extremity radiculopathy.   PROCEDURES:  1. Left L3-L4 hemilaminotomy and diskectomy with lateral recess     decompression.  2. L4-L5 lateral recess decompression, left.   SURGEON:  Sharolyn Douglas, M.D.   ASSISTANT:  Verlin Fester, P.A.   ANESTHESIA:  General endotracheal.   COMPLICATIONS:  None.   INDICATIONS:  The patient is a 65 year old male with chronic back and left  lower extremity pain.  His MRI scan shows multilevel degenerative changes  with severe disk space narrowing most pronounced at L3-L4 and L4-L5.  At L3-  L4, there is a central and left disk herniation which is extruded superiorly  approximately 3-4 mm.  At L4-L5, there is a broad-based disk bulge with  facet hypertrophy resulting in lateral recess narrowing on the left.  The  patient failed a long and appropriate course of conservative therapy  including physical therapy and epidural steroid injections.  He elected to  undergo microsurgical decompression in hopes of improving his symptoms.   PROCEDURE:  The patient was properly identified in the holding area, taken  to the operating room.  He was given general endotracheal anesthesia without  difficulty.  He was turned prone on the Acumed four-poster position frame.  All bony prominences were padded.  Face and eyes were protected at all  times.  The back was prepped and draped in the usual sterile fashion.  A  spinal needle was placed at L3-L4 and x-ray taken to confirm this  location.  A 2.5-cm incision was made from the spinal needle caudad.  Dissection was  carried down to the deep fascia.  The deep fascia was incised, and the  paraspinal muscles subperiosteally exposed on the left side out to the L3-L4  and L4-L5 facet joints.  We placed the [facility] retractor.  We then placed  a curet under the L3 lamina, took an x-ray to confirm this location.  A high-  speed bur was utilized to remove the inferior one third of the L3 lamina and  the medial one third of the facet joint complex.  We used a Kerrison punch  to then remove the ligamentum flavum.  The superior facet of L4 was undercut  decompressing the lateral recess.  We identified the transversing L4 nerve  root.  It was gently retracted medially.  Epidural bleeders were controlled  with bipolar.  A bulging disk was identified.  A sharp annulotomy was  performed.  There was very little disk material within the interspace.  There were several small pieces of disk material that had extruded  superiorly underneath the posterior longitudinal ligament.  This was fished  out with a nerve hook.  When we were done, the L4 nerve root was completely  decompressed from its takeoff out its foramen.  The area was irrigated  thoroughly.  We then turned our attention to the L4-L5 level.  A laminotomy  was carried out with the high-speed bur removing the inferior one third of  L4 and the medial one third of the facet joint complex.  The L5 superior  facet was undercut.  The ligamentum flavum was elevated.  The L5 nerve root  was identified.  It was gently retracted medially.  There was no significant  disk herniation noted.  The lateral recess was completely decompressed.  The  wound was copiously irrigated.  Two cc of fentanyl was left in the epidural  space.  All bleeding was controlled with bipolar electrocautery and Gelfoam.  The deep fascia was closed with a running #1 Vicryl, subcutaneous layer  closed with a 2-0  interrupted Vicryl followed by a running 3-0 subcuticular  Vicryl suture on the skin.  Benzoin and Steri-Strips were placed.  A sterile  dressing was applied.  The patient was turned supine without difficulty and  transferred to the recovery room in stable condition, able to move his upper  and lower extremities.  It should be noted that after the initial exposure,  the surgical microscope was draped and brought into the field.  The  remainder of the operation was done under direct microscopic illumination  and magnification.                                               Sharolyn Douglas, M.D.    MC/MEDQ  D:  03/02/2003  T:  03/02/2003  Job:  [id number]

## 2011-01-04 NOTE — H&P (Signed)
NAME:  [name]                           ACCOUNT NO.:  [id number]   MEDICAL RECORD NO.:  [id number]                   PATIENT TYPE:  OIB   LOCATION:  NA                                   FACILITY:  [facility]   PHYSICIAN:  Sharolyn Douglas, M.D.                     DATE OF BIRTH:  August 03, 1946   DATE OF ADMISSION:  03/02/2003  DATE OF DISCHARGE:                                HISTORY & PHYSICAL   CHIEF COMPLAINT:  Back and left buttock and lower extremity pain.   HISTORY OF PRESENT ILLNESS:  The patient is a 65 year old male with chronic  back pain developed into some left buttock pain and left lower extremity  pain back in August 2003.  His back pain has been going on for a number of  years now.  He has failed numerous methods of conservative management  including activity modification, pain medications, physical therapy, anti-  inflammatory medications, injections.  The pain has gotten to the point that  it is interfering with activities of daily living, quality of life.  It is  severe in nature, constant in nature, keeping him up at night.  Risks and  benefits of the proposed surgery were discussed with the patient.  He  indicated understanding and opted to proceed.   ALLERGIES:  No known drug allergies.   MEDICATIONS:  1. Lotrel 10/20 p.o. daily.  2. Toprol 50 mg p.o. daily.  3. Protonix 40 mg p.o. daily.  4. Celexa 20 mg p.o. daily.  5. Aspirin daily which he stopped in preparation for surgery.  6. Neurontin 300 mg p.o. t.i.d.   PAST MEDICAL HISTORY:  1. GERD.  2. Hypertension.   PAST SURGICAL HISTORY:  1. Hernia repair in 1968.  2. Cystoscopy in 2002.   SOCIAL HISTORY:  The patient denies tobacco use.  He uses alcohol on an  occasional basis.  He is married.  Works as a Human resources officer.  He does  a lot of traveling in a car.  His wife will be available to help him through  his postoperative course.   FAMILY MEDICAL HISTORY:  Significant for diabetes, cancer, and  Parkinson's  disease.   REVIEW OF SYSTEMS:  The patient denies any fevers, chills, sweats, or  bleeding tendencies.  CNS:  Denies blurred vision, double vision, seizures,  headaches, paralysis.  CARDIOVASCULAR:  Denies chest pain, angina,  orthopnea, claudication, or palpitations.  PULMONARY:  Denies shortness of  breath, productive cough, or hemoptysis.  GI:  Denies nausea, vomiting,  constipation, diarrhea, melena, bloody stools.  GU:  Denies dysuria,  hematuria, or discharge.  MUSCULOSKELETAL:  Per HPI.   PHYSICAL EXAMINATION:  VITAL SIGNS:  Blood pressure 178/92, respirations 16  and unlabored, pulse is 62 and regular.  GENERAL:  The patient is a 65 year old male who is alert and oriented in no  acute distress.  He  is well nourished, well groomed, appears his stated age.  Pleasant and cooperative to examination.  HEENT:  Head is normocephalic, atraumatic.  Pupils are equal, round, and  reactive.  Extraocular movements intact.  Nares patent.  Pharynx is clear.  NECK:  Soft to palpation.  No bruits appreciated.  No lymphadenopathy or  thyromegaly noted.  CHEST:  Clear to auscultation bilaterally.  No rales, rhonchi, stridor,  wheezes, or friction rubs.  BREASTS:  Not pertinent, not performed.  HEART:  S1, S2.  Regular rate and rhythm.  No murmurs, gallops, or rubs  noted.  ABDOMEN:  Soft to palpation.  Positive bowel sounds throughout.  Nontender,  nondistended.  No organomegaly noted.  GENITOURINARY:  Not pertinent, not performed.  EXTREMITIES:  The patient has back and left lower extremity pain.  Motor  function and sensation are grossly intact.  Pulses are intact and symmetric.  SKIN:  Intact without any lesions or rashes.   MRI shows L3-4 and 4-5 lateral recess stenosis.   IMPRESSION:  1. Left L3-4 and 4-5 lateral recess stenosis.  2. Hypertension.  3. Gastroesophageal reflux disease.   PLAN:  Admit to [facility] on March 02, 2003 for a left L3-4 and 4-5  lateral  recess decompression.  This is to be done by Dr. Sharolyn Douglas.     Verlin Fester, P.A.                       Sharolyn Douglas, M.D.    CM/MEDQ  D:  03/02/2003  T:  03/02/2003  Job:  [id number]

## 2011-01-04 NOTE — Op Note (Signed)
NAME:  [name], RYATT CORSINO                           ACCOUNT NO.:  [id number]   MEDICAL RECORD NO.:  [id number]                   PATIENT TYPE:  OIB   LOCATION:  2550                                 FACILITY:  [facility]   PHYSICIAN:  Sharolyn Douglas, M.D.                     DATE OF BIRTH:  1946/05/13   DATE OF PROCEDURE:  06/29/2003  DATE OF DISCHARGE:  06/29/2003                                 OPERATIVE REPORT   PREOPERATIVE DIAGNOSES:  1. Recurrent L3-4 disk herniation with left lower extremity radiculopathy.  2. Multilevel degenerative disk disease.   POSTOPERATIVE DIAGNOSES:  1. Recurrent L3-4 disk herniation with left lower extremity radiculopathy.  2. Multilevel degenerative disk disease.   PROCEDURE:  Revision left L3-4 laminotomy and diskectomy.   SURGEON:  Sharolyn Douglas, M.D.   ASSISTANT:  Verlin Fester, P.A.   ANESTHESIA:  General endotracheal.   COMPLICATIONS:  None.   INDICATIONS FOR PROCEDURE:  The patient is a 65 year old male who is four  months status post left L3-4 diskectomy and L4-5 lateral recess  decompression. Initially he noted improvement in his lower extremity pain.  Over the past several months, he has had recurrence of his symptoms  primarily in the left anterior thigh. Repeat MRI scan showed a recurrent  herniation at L3-4. Because he has been refractory to all conservative  treatment options, he has elected to undergo revision microdiskectomy in  hopes of improving his symptoms. He has multilevel degenerative disk  disease, understands that he is going to have continued back pain.   DESCRIPTION OF PROCEDURE:  The patient was properly identified in the  holding area, taken to the operating room. He underwent general endotracheal  anesthesia without difficulty. He was given prophylactic IV antibiotics. He  was carefully turned prone onto the Wilson frame. All bony prominences were  padded, both sides were protected at all times. The back was prepped and  draped  in the usual sterile fashion. The previous 3 cm incision was  utilized. Dissection was carried through the deep fascia. The paraspinal  muscles were subperiosteally elevated out to the L3-4 facet joint. We placed  a deep McCullough retractor. We took an intraoperative x-ray with a curette  under the L4 lamina confirming our location. We then carefully defined the  edges of the L3-4 laminotomy using a microcurette. A high speed bur was used  to remove a small portion of the medial facet joint complex as well as the  residual L3 lamina. We were able to gently mobilize the thecal sac and L4  nerve root medially. We immediately encountered a Koob disk fragment within  the spinal canal. This was easily removed with the sucker. We then removed  several additional Boyack fragments of disk which were sitting on the L4 nerve  root and had migrated down to the L4 pedicle. We then identified the  previous annulotomy.  The disk space was quite collapsed. Using a  micropituitary, several degenerative appearing disk fragments were removed  from within the interspace. The interspace was then irrigated with angiocath  irrigation. The spinal canal was irrigated copiously. All bleeding was  controlled with bipolar electrocautery and Gelfoam. We explored the canal,  there were no more fragments and the L4 nerve root was Prevost from its takeoff  out the L4-5 foramen. We placed 2 mL of fentanyl into the exposed epidural  space for postoperative anesthesia. The deep fascia closed with a #0 Vicryl  running suture subcutaneously or closed with 2-0 Vicryl followed by a  running 3-0 subcuticular suture on the skin. Benzoin and Steri-Strips  placed. A sterile dressing applied. The patient was turned supine, extubated  without difficulty and transferred to the recovery room in stable condition  able to move his upper and lower extremities.                                               Sharolyn Douglas, M.D.    MC/MEDQ  D:   06/29/2003  T:  06/30/2003  Job:  [id number]

## 2011-01-04 NOTE — Op Note (Signed)
[facility]  Patient:    LYN, DEEMER                        MRN: [id number] Proc. Date: 11/07/00 Adm. Date:  [id number] Attending:  Nelma Rothman Iii                           Operative Report  PREOPERATIVE DIAGNOSES:  Severe frequency, urgency, nocturia.  POSTOPERATIVE DIAGNOSES:  Severe frequency, urgency, nocturia.  PROCEDURE:  Cystourethroscopy, hydraulic bladder distention, bladder biopsy.  SURGEON:  Gaynelle Arabian, M.D.  ANESTHESIA:  General laryngeal airway.  ESTIMATED BLOOD LOSS:  Negligible.  TUBES:  None.  COMPLICATIONS:  None. A total of 700 cc capacity was noted to 80 cm of water.  INDICATIONS FOR PROCEDURE:  Mr. Gaffey is a very nice 65 year old white male who presents with progressive frequency of urination over the last two years serially progressed. He notes he has nocturia x 2-3 but that has not been the real problem. He urinates 15-20 times per day. He notes he cannot fly, he notes no blood in his urine. He occasionally has urge incontinence and then goes to the bathroom quickly but the urgency has been most problematic. He has undergone CT scan of the pelvis which was essentially normal. He underwent 24 hour urinates x 2 and has had approximately 3000 cc per 24 hours and we have asked him to cut back on the fluids. After understanding the risks, benefits, and alternatives, he elected to proceed with cysto bladder biopsy. Dictation ended here. DD:  11/07/00 TD:  11/08/00 Job: [id number]

## 2011-01-04 NOTE — H&P (Signed)
NAME:  [name], [name]                           ACCOUNT NO.:  [id number]   MEDICAL RECORD NO.:  [id number]                   PATIENT TYPE:  OIB   LOCATION:  2891                                 FACILITY:  [facility]   PHYSICIAN:  Sharolyn Douglas, M.D.                     DATE OF BIRTH:  12-01-1945   DATE OF ADMISSION:  06/29/2003  DATE OF DISCHARGE:                                HISTORY & PHYSICAL   CHIEF COMPLAINT:  Low back and left lower extremity pain.   HISTORY OF PRESENT ILLNESS:  The patient is a 65 year old white male who has  been having low back and left lower extremity pain that has been going on  for 20+ years. He underwent a microdiskectomy approximately four months ago  of L3-4 and L4-5. Unfortunately, he did not get any significant relief of  his symptoms for long term. Initially, he did well for a few weeks; however,  the pain did return, and on repeat MRI, he was found to have recurrent HNP  at L3-4. Secondary to these findings as well as failure to improve over the  last four months, it was felt that the best course of management will be a  revision L3-4 microdiskectomy. Risks and benefits of the procedure were  discussed with the patient by Dr. Noel Gerold as well as myself, and he indicated  understanding and opted to proceed.   ALLERGIES:  No known drug allergies.   MEDICATIONS:  Lotrel, Toprol, Protonix, Diclofenac, Celexa, and aspirin  which was stopped in preparation for surgery.   PAST MEDICAL HISTORY:  1. Hypertension.  2. GERD.   PAST SURGICAL HISTORY:  1. L4-5 laminotomy and diskectomy in July of 2004.  2. Hernia repair in 1968.   SOCIAL HISTORY:  The patient denies any tobacco abuse. He uses alcohol on an  occasional basis. He is married. He has two grown children.   FAMILY HISTORY:  Noncontributory.   REVIEW OF SYSTEMS:  The patient denies any fevers, chills, sweats, or  bleeding tendencies. CENTRAL NERVOUS SYSTEM:  Denies blurred vision, double  vision,  seizures, headaches, or paralysis. CARDIOVASCULAR:  Denies chest  pain, angina, orthopnea, claudication, or palpitations. PULMONARY:  Denies  shortness of breath, productive cough, or hemoptysis. GASTROINTESTINAL:  Denies nausea, vomiting, constipation, diarrhea, melena, or bloody stool.  GENITOURINARY:  Denies dysuria, hematuria, or discharge. MUSCULOSKELETAL:  As per HPI.   PHYSICAL EXAMINATION:  VITAL SIGNS:  Blood pressure 162/82, respirations 16  and unlabored, pulse is 76 and regular.  GENERAL APPEARANCE:  The patient is a 65 year old who is alert and oriented  in no acute distress, well-nourished, well-groomed, appears stated age;  pleasant and cooperative to examination.  HEENT:  Head is normocephalic, atraumatic. Pupils are equal, round, and  reactive to light. Extraocular movements intact. Nares patent. Oropharynx  clear.  NECK:  Supple to palpation. No lymphadenopathy, thyromegaly, or  bruits  appreciated.  CHEST:  Clear to auscultation bilaterally. No rales, rhonchi, stridor,  wheeze, or friction rub.  BREASTS:  Not pertinent, not performed.  HEART:  S1 and S2, regular rate and rhythm; no murmurs, gallops, or rubs  noted.  ABDOMEN:  Soft to palpation, nontender, nondistended, no organomegaly noted,  positive bowel sounds throughout.  GENITOURINARY:  Not pertinent, not performed.  EXTREMITIES:  The patient has left lower extremity pain. Motor flexion and  sensation are grossly intact. Pulses are intact and symmetric bilaterally.  SKIN:  Intact without lesions or rashes.   RADIOLOGIC FINDINGS:  MRI shows HNP at L3-4.   IMPRESSION:  1. Herniated nucleus pulposus, L3-4.  2. Hypertension.  3. Gastroesophageal reflux disease.   PLAN:  Admit to [facility] hospital on June 29, 2003 for revision  microdiskectomy, L3-4 secondary to recurrent disk herniation. This will be  done by Dr. Sharolyn Douglas.      Verlin Fester, P.A.                       Sharolyn Douglas, M.D.     CM/MEDQ  D:  06/29/2003  T:  06/29/2003  Job:  [id number]

## 2011-01-04 NOTE — Assessment & Plan Note (Signed)
[facility]                                   ON-CALL NOTE   NAME:[name] [name] PLANT                        MRN:          [id number]  DATE:04/03/2006                            DOB:          [birth year]    TIME OF CALL:  8:06 p.m.   PHONE NUMBER:  [phone]   CALLER:  [name] [name] with [facility].   OBJECTIVE:  The patient had sed rate and CRP ordered STAT; both were normal.   ASSESSMENT:  Normal labs drawn state.   PLAN:  The labs will be sent to [facility] and the patient will be  appropriately notified.   PRIMARY CARE [name] [name]:  Dr. Fabian [name].   HOME OFFICE:  [place].                                   Arta Silence, MD   RNS/MedQ  DD:  04/04/2006  DT:  04/04/2006  Job #:  [id number]

## 2011-01-04 NOTE — Discharge Summary (Signed)
[facility]  Patient:    [name], [name]                          MRN: [id number] Proc. Date: 03/16/01 Adm. Date:  03/15/01 Disc. Date: 03/16/01 Attending:  Stacie Glaze, M.D. [facility]                           Discharge Summary  ADMITTING DIAGNOSIS:  Chest pain, rule out myocardial infarction.  DISCHARGE DIAGNOSES: 1. Atypical chest pain. 2. Hypertension. 3. Gastroesophageal reflux disease.  HOSPITAL COURSE:  The patient is a 65 year old white male whose risk factors include age and hypertension who presented with atypical mid sternal chest pain, nonradiating associated with shortness of breath that did not go away with rest.  He said that it happened off and on from the day prior to admission.  In the emergency room, he was found to have an EKG with nonspecific ST-T wave changes and EKG evidence of left ventricular hypertrophy.  His first troponin-I and CK were negative.  He was admitted for rule out myocardial infarction with serial CK-MBs and a repeat troponin and repeat EKG.  He was placed empirically on Lopressor 25 mg p.o. b.i.d. for his hypertension and was continued on his home medications and Diovan HC 150 mg, hydrochlorothiazide 150/12.5.  He did well.  On admission, his chest pain resolved spontaneously.  He had an echocardiogram, and results are pending at this time but the initial impression was that there was no focal wall motion abnormality and no evidence of left ventricular hypertrophy. His repeat EKG was improved with none of the nonspecific S-T wave changes seen initially, and his blood pressure was well-controlled at 126/78 on the additional Lopressor.  IMPRESSION:  Atypical chest pain, probable hypertension, probable anxiety and possible gastroesophageal reflux disease as contributing factors.  PLAN:  Stress Cardiolite is scheduled for Friday after discharge to rule out any coronary disease.  He is instructed to have no strenuous  activity until then.  He will continue Lopressor 25 mg p.o. b.i.d. and Protonix 40 mg p.o. q.d. until he follows up with his primary care physician, Dr. Tawanna Cooler, in three weeks. DD:  03/16/01 TD:  03/16/01 Job: [id number]

## 2011-01-04 NOTE — Procedures (Signed)
[facility]  Patient:    [name], [name]                        MRN: [id number] Proc. Date: 12/11/00 Adm. Date:  [id number] Disc. Date: [id number] Attending:  Nelma Rothman Iii                           Procedure Report  PROCEDURE:  The patient was placed in the supine position after proper general endotracheal anesthesia.  He was placed in the dorsolithotomy position and prepped and draped with Betadine in a sterile fashion.  Cystourethroscopy was performed with a 22.5-French Olympus panendoscope utilizing the 12 ______. The bladder was carefully inspected and noted to be without lesion.  ______ clear urine was noted bilaterally.  There was mild trilobar hypertrophy noted but no lesions were noted in the bladder or the urethra.  The hydraulic bladder distention was then performed and in a pressure 80 cc of water he was noted to have a 700 cc capacity bladder.  Following this, the urine was drained and there were a few glomerulations posteriorly and photographs of that were obtained.  Biopsy of the posterior midline in the bladder was then performed, where some of the glomerulations had formed with a core cut biopsy portion submitted to pathology.  The base was cauterized with Bovie coagulation cautery and good hemostasis was noted to be present.  The bladder was totally drained.  The panendoscope was visually removed and the patient was taken to the recovery room stable. DD:  12/11/00 TD:  12/11/00 Job: [id number]

## 2011-01-11 ENCOUNTER — Ambulatory Visit: Payer: Self-pay | Admitting: Internal Medicine

## 2011-01-16 ENCOUNTER — Encounter: Payer: Self-pay | Admitting: Internal Medicine

## 2011-01-16 ENCOUNTER — Ambulatory Visit (INDEPENDENT_AMBULATORY_CARE_PROVIDER_SITE_OTHER): Payer: 59 | Admitting: Internal Medicine

## 2011-01-16 VITALS — BP 130/80 | HR 72 | Temp 98.2°F | Resp 14 | Ht 69.0 in | Wt 152.0 lb

## 2011-01-16 DIAGNOSIS — M545 Low back pain: Secondary | ICD-10-CM

## 2011-01-16 DIAGNOSIS — E291 Testicular hypofunction: Secondary | ICD-10-CM

## 2011-01-16 DIAGNOSIS — K5909 Other constipation: Secondary | ICD-10-CM

## 2011-01-16 DIAGNOSIS — M48061 Spinal stenosis, lumbar region without neurogenic claudication: Secondary | ICD-10-CM

## 2011-01-16 DIAGNOSIS — IMO0001 Reserved for inherently not codable concepts without codable children: Secondary | ICD-10-CM

## 2011-01-16 DIAGNOSIS — R1031 Right lower quadrant pain: Secondary | ICD-10-CM

## 2011-01-16 MED ORDER — TESTOSTERONE 50 MG/5GM (1%) TD GEL: 5.0000 g | Freq: Every day | TRANSDERMAL | Status: DC

## 2011-01-16 NOTE — Progress Notes (Signed)
Subjective:    Patient ID: [name], male    DOB: 1946-04-28, 65 y.o.   MRN: [id number]  HPI patient has chronic low back pain and has been on methadone.  He is being followed by Dr. Venia Carbon and has attempted a titration down on the methadone.  He has severe low back pain radiating to his hips bilaterally.  He states that he has "gone downhill" his function level is poor due to his pain.  The pain medications have caused chronic constipation.    Review of Systems  Constitutional: Negative for fever and fatigue.  HENT: Negative for hearing loss, congestion, neck pain and postnasal drip.   Eyes: Positive for pain and redness. Negative for discharge and visual disturbance.  Respiratory: Negative for cough, shortness of breath and wheezing.   Cardiovascular: Negative for leg swelling.  Gastrointestinal: Positive for abdominal pain, constipation and abdominal distention.  Genitourinary: Negative for urgency and frequency.  Musculoskeletal: Positive for myalgias, back pain, joint swelling, arthralgias and gait problem.  Skin: Negative for color change and rash.  Neurological: Negative for weakness and light-headedness.  Hematological: Negative for adenopathy.  Psychiatric/Behavioral: Negative for behavioral problems.   Past Medical History  Diagnosis Date  . Nocturia   . Essential and other specified forms of tremor   . Hypersomnia with sleep apnea, unspecified   . Abdominal pain, right lower quadrant   . Acute prostatitis   . Spinal stenosis, lumbar region, without neurogenic claudication   . Localized osteoarthrosis not specified whether primary or secondary, lower leg   . Myalgia and myositis, unspecified   . Urinary frequency   . Urinary frequency   . Disuse osteoporosis   . Disuse osteoporosis   . Other testicular hypofunction   . Syncope and collapse   . Hypopotassemia   . Hemarthrosis, upper arm   . Unspecified adverse effect of unspecified drug, medicinal and  biological substance   . Unspecified gastritis and gastroduodenitis without mention of hemorrhage   . Diverticulosis of colon (without mention of hemorrhage)   . Family history of malignant neoplasm of gastrointestinal tract   . Other constipation   . Osteoporosis, unspecified   . Lumbago   . Ganglion and cyst of synovium, tendon, and bursa   . Hypertension   . GERD (gastroesophageal reflux disease)    Past Surgical History  Procedure Date  . Hernia repair   . Lumbar surgery 3 times     reports that he has never smoked. He does not have any smokeless tobacco history on file. He reports that he does not drink alcohol or use illicit drugs. family history includes Cancer in his mother. Allergies  Allergen Reactions  . Pregabalin     REACTION: dizzyness and mental status changes       Objective:   Physical Exam  Constitutional: He is oriented to person, place, and time. He appears well-developed and well-nourished.  HENT:  Head: Normocephalic and atraumatic.  Eyes: Conjunctivae are normal. Pupils are equal, round, and reactive to light.  Neck: Normal range of motion. Neck supple.  Cardiovascular: Normal rate and regular rhythm.   Pulmonary/Chest: Effort normal and breath sounds normal.  Abdominal: Soft. Bowel sounds are normal.  Musculoskeletal: He exhibits edema and tenderness.  Neurological: He is alert and oriented to person, place, and time.          Assessment & Plan:  The pt has multi disk dz and has applied for new therapeutics options including the fibrinogen injections The group  in [place] is considering him for the trial. Pain control by Dr Vear Clock with  Reduction in methadone The constipation has not improved with the reduction in methadone. The pt has an appointment with his specialist this week Blood pressure is stable on samples of tribenzor

## 2011-01-18 ENCOUNTER — Telehealth: Payer: Self-pay | Admitting: Pulmonary Disease

## 2011-01-18 ENCOUNTER — Other Ambulatory Visit: Payer: Self-pay | Admitting: Pulmonary Disease

## 2011-01-18 DIAGNOSIS — G4731 Primary central sleep apnea: Secondary | ICD-10-CM

## 2011-01-18 NOTE — Telephone Encounter (Signed)
Called and spoke with pt, he states he still has not received BiPAP machine from [facility] and they have not picked up O2 tanks. Per our records [place] gave order to [facility]. I called [facility] and spoke with [facility] who advised she never received the order. Libby please advise. Thanks.

## 2011-01-18 NOTE — Telephone Encounter (Signed)
Done

## 2011-01-18 NOTE — Telephone Encounter (Signed)
KC do you want to d/c pt oxygen? Please advise. Carron Curie, CMA

## 2011-01-18 NOTE — Telephone Encounter (Signed)
I don't see an order to d/c the home oxygen. Please send one to the pcc box.  Called Lecretia about bipap order and she is checking into this b/c we have sent this over twice. I think the confusion is coming from pt being on cpap and this was d/c and bipap was on trial x 3-4 wks. Pt came in March 2012, cpap was d/c and home o2 started. Order given to [place] to set up bipap at pressure of 12/8. Pt should be contact Monday 01/21/11 about this issue. Please send D/C for o2.

## 2011-01-21 NOTE — Telephone Encounter (Signed)
Order has been placed in epic.  ATC pt x1 - line busy.

## 2011-01-22 NOTE — Telephone Encounter (Signed)
Called, spoke with pt.  He is aware order sent to [facility] to d/c o2 and stated they picked it up yesterday.  He voiced no further concerns at this time.

## 2011-01-28 ENCOUNTER — Telehealth: Payer: Self-pay | Admitting: Internal Medicine

## 2011-01-28 NOTE — Telephone Encounter (Signed)
Chart opened in error

## 2011-02-26 ENCOUNTER — Telehealth: Payer: Self-pay | Admitting: Internal Medicine

## 2011-02-26 NOTE — Telephone Encounter (Signed)
Please advise- nothing in chart from last ov

## 2011-02-26 NOTE — Telephone Encounter (Signed)
Pt is sch for fup appt in Aug. Pt is req to get labs done 1 week prior. Pls advise

## 2011-02-26 NOTE — Telephone Encounter (Signed)
Per dr Lindajo Royal labs he ordered in other part of this message-thanks

## 2011-02-26 NOTE — Telephone Encounter (Signed)
Please order a lipid liver and basic metabolic panel as well as a testosterone and a B12 level Lipid and liver 272.4 Basic metabolic panel 401.9 Testosterone 607.84 B12 281.0

## 2011-02-26 NOTE — Telephone Encounter (Signed)
Called pt and sch for labs as noted.

## 2011-02-26 NOTE — Telephone Encounter (Signed)
C. no

## 2011-02-26 NOTE — Telephone Encounter (Signed)
thanks

## 2011-03-19 ENCOUNTER — Other Ambulatory Visit (INDEPENDENT_AMBULATORY_CARE_PROVIDER_SITE_OTHER): Payer: 59

## 2011-03-19 DIAGNOSIS — N529 Male erectile dysfunction, unspecified: Secondary | ICD-10-CM

## 2011-03-19 DIAGNOSIS — E785 Hyperlipidemia, unspecified: Secondary | ICD-10-CM

## 2011-03-19 DIAGNOSIS — I10 Essential (primary) hypertension: Secondary | ICD-10-CM

## 2011-03-19 DIAGNOSIS — D510 Vitamin B12 deficiency anemia due to intrinsic factor deficiency: Secondary | ICD-10-CM

## 2011-03-19 LAB — BASIC METABOLIC PANEL
BUN: 15 mg/dL (ref 6–23)
Calcium: 8.6 mg/dL (ref 8.4–10.5)
GFR: 89.69 mL/min (ref >60.00)
Glucose, Bld: 85 mg/dL (ref 70–99)
Potassium: 3.6 mEq/L (ref 3.5–5.1)
Sodium: 143 mEq/L (ref 135–145)

## 2011-03-19 LAB — LIPID PANEL
Cholesterol: 171 mg/dL (ref 0–200)
HDL: 69 mg/dL (ref >39.00)
LDL Cholesterol: 90 mg/dL (ref 0–99)
VLDL: 12.2 mg/dL (ref 0.0–40.0)

## 2011-03-19 LAB — HEPATIC FUNCTION PANEL
AST: 24 U/L (ref 0–37)
Albumin: 4.4 g/dL (ref 3.5–5.2)
Total Bilirubin: 0.8 mg/dL (ref 0.3–1.2)

## 2011-03-19 LAB — VITAMIN B12: Vitamin B-12: 1145 pg/mL — ABNORMAL HIGH (ref 211–911)

## 2011-03-19 LAB — TESTOSTERONE: Testosterone: 188.41 ng/dL — ABNORMAL LOW (ref 350.00–890.00)

## 2011-03-27 ENCOUNTER — Encounter: Payer: Self-pay | Admitting: Internal Medicine

## 2011-03-27 ENCOUNTER — Ambulatory Visit (INDEPENDENT_AMBULATORY_CARE_PROVIDER_SITE_OTHER): Payer: 59 | Admitting: Internal Medicine

## 2011-03-27 VITALS — BP 130/70 | HR 68 | Temp 97.9°F | Resp 14 | Ht 69.0 in | Wt 150.0 lb

## 2011-03-27 DIAGNOSIS — IMO0001 Reserved for inherently not codable concepts without codable children: Secondary | ICD-10-CM

## 2011-03-27 DIAGNOSIS — M545 Low back pain, unspecified: Secondary | ICD-10-CM

## 2011-03-27 DIAGNOSIS — M48061 Spinal stenosis, lumbar region without neurogenic claudication: Secondary | ICD-10-CM

## 2011-03-27 DIAGNOSIS — R351 Nocturia: Secondary | ICD-10-CM

## 2011-03-27 MED ORDER — SOLIFENACIN SUCCINATE 5 MG PO TABS: 5.0000 mg | ORAL_TABLET | Freq: Every day | ORAL | Status: DC

## 2011-03-27 NOTE — Patient Instructions (Signed)
Watch straining with stools used the Senokot if necessary to loosen your bowels you have a small inguinal hernia at the site of previous repair

## 2011-03-27 NOTE — Progress Notes (Signed)
Subjective:    Patient ID: [name], male    DOB: 1946/06/24, 65 y.o.   MRN: [id number]  HPI  He is seeing Dr. Thyra Breed pain specialist.  He has chronic back pain and spinal stenosis pain and had been on methadone for 3 years at a stable dose the pain specialist wanted to see if we could wean off the methadone and try alternative methods for controlling the pain. The titration to 3 mg was without incident however continuing to decrease it has resulted in increased pain patient is monitoring this and will followup with Dr. Vear Clock  Follow up of lipids with reaching goal with fish oil and red yeast rice  Constipation improved with fiber  Review of Systems  Constitutional: Negative for fever and fatigue.  HENT: Negative for hearing loss, congestion, neck pain and postnasal drip.   Eyes: Negative for discharge, redness and visual disturbance.  Respiratory: Negative for cough, shortness of breath and wheezing.   Cardiovascular: Negative for leg swelling.  Gastrointestinal: Negative for abdominal pain, constipation and abdominal distention.  Genitourinary: Negative for urgency and frequency.  Musculoskeletal: Negative for joint swelling and arthralgias.  Skin: Negative for color change and rash.  Neurological: Negative for weakness and light-headedness.  Hematological: Negative for adenopathy.  Psychiatric/Behavioral: Negative for behavioral problems.   Past Medical History  Diagnosis Date  . Nocturia   . Essential and other specified forms of tremor   . Hypersomnia with sleep apnea, unspecified   . Abdominal pain, right lower quadrant   . Acute prostatitis   . Spinal stenosis, lumbar region, without neurogenic claudication   . Localized osteoarthrosis not specified whether primary or secondary, lower leg   . Myalgia and myositis, unspecified   . Urinary frequency   . Urinary frequency   . Disuse osteoporosis   . Disuse osteoporosis   . Other testicular hypofunction   .  Syncope and collapse   . Hypopotassemia   . Hemarthrosis, upper arm   . Unspecified adverse effect of unspecified drug, medicinal and biological substance   . Unspecified gastritis and gastroduodenitis without mention of hemorrhage   . Diverticulosis of colon (without mention of hemorrhage)   . Family history of malignant neoplasm of gastrointestinal tract   . Other constipation   . Osteoporosis, unspecified   . Lumbago   . Ganglion and cyst of synovium, tendon, and bursa   . Hypertension   . GERD (gastroesophageal reflux disease)    Past Surgical History  Procedure Date  . Hernia repair   . Lumbar surgery 3 times     reports that he has never smoked. He does not have any smokeless tobacco history on file. He reports that he does not drink alcohol or use illicit drugs. family history includes Cancer in his mother. Allergies  Allergen Reactions  . Pregabalin     REACTION: dizzyness and mental status changes       Objective:   Physical Exam  Nursing note and vitals reviewed. Constitutional: He is oriented to person, place, and time. He appears well-developed and well-nourished.  HENT:  Head: Normocephalic and atraumatic.  Eyes: Conjunctivae are normal. Pupils are equal, round, and reactive to light.  Neck: Normal range of motion. Neck supple.  Cardiovascular: Normal rate and regular rhythm.   Pulmonary/Chest: Effort normal and breath sounds normal.  Abdominal: Soft. Bowel sounds are normal.  Neurological: He is oriented to person, place, and time.  Skin: Skin is warm and dry.  Psychiatric: He has a  normal mood and affect. Thought content normal.   Tenderness right groin       Assessment & Plan:  Chronic pain on methadone and we are hoping to titrate off and change to butrans. The pt has been on androgel and has not noted any increased sexual activity He has noted increased urination. This may be BPH... will DC the androgel The best constipation "pill" for your  condition is senako tThe straining has resulted in a small hernia

## 2011-06-13 ENCOUNTER — Encounter (INDEPENDENT_AMBULATORY_CARE_PROVIDER_SITE_OTHER): Payer: Self-pay | Admitting: General Surgery

## 2011-06-13 ENCOUNTER — Ambulatory Visit (INDEPENDENT_AMBULATORY_CARE_PROVIDER_SITE_OTHER): Payer: 59 | Admitting: General Surgery

## 2011-06-13 VITALS — BP 156/84 | HR 64 | Temp 98.6°F | Resp 14 | Ht 69.0 in | Wt 150.8 lb

## 2011-06-13 DIAGNOSIS — R1031 Right lower quadrant pain: Secondary | ICD-10-CM | POA: Insufficient documentation

## 2011-06-13 NOTE — Patient Instructions (Signed)
We will call you after the CT scan and discuss the results.

## 2011-06-13 NOTE — Progress Notes (Signed)
Addended by: Milas Hock on: 06/13/2011 03:09 PM   Modules accepted: Orders

## 2011-06-13 NOTE — Progress Notes (Signed)
Chief Complaint  Patient presents with  . Other    Eval [facility] - self referred    HPI [name] is a 65 y.o. male.   HPI  He is referred by Dr. Lovell Sheehan for evaluation of a recurrent right inguinal hernia. 6 months ago he began having some pain in the right groin that radiates down to the testicle. He was hoping it would go away. He was examined until he might have a recurrent hernia. He's had 2 previous hernia repairs one in 1965 and one in 1968. He has had some intermittent constipation and he thinks this may be the cause of his recurrent hernia if he has one.  He does have chronic urinary frequency. He is here for further evaluation of this. Past Medical History  Diagnosis Date  . Nocturia   . Essential and other specified forms of tremor   . Hypersomnia with sleep apnea, unspecified   . Abdominal pain, right lower quadrant   . Acute prostatitis   . Spinal stenosis, lumbar region, without neurogenic claudication   . Localized osteoarthrosis not specified whether primary or secondary, lower leg   . Myalgia and myositis, unspecified   . Urinary frequency   . Urinary frequency   . Disuse osteoporosis   . Disuse osteoporosis   . Other testicular hypofunction   . Syncope and collapse   . Hypopotassemia   . Hemarthrosis, upper arm   . Unspecified adverse effect of unspecified drug, medicinal and biological substance   . Unspecified gastritis and gastroduodenitis without mention of hemorrhage   . Diverticulosis of colon (without mention of hemorrhage)   . Family history of malignant neoplasm of gastrointestinal tract   . Other constipation   . Osteoporosis, unspecified   . Lumbago   . Ganglion and cyst of synovium, tendon, and bursa   . Hypertension   . GERD (gastroesophageal reflux disease)   . Hyperlipidemia   . Constipation   . Degenerative disc disease     Past Surgical History  Procedure Date  . Lumbar surgery 3 times   . Microdisectomy 03/02/2003, 06/29/2003, 12/24/2005    . Hernia repair 1965, 1968    Family History  Problem Relation Age of Onset  . Cancer Mother     colon  . Dementia Mother   . Parkinsonism Father 12    Social History History  Substance Use Topics  . Smoking status: Never Smoker   . Smokeless tobacco: Never Used  . Alcohol Use: Yes     less than 5 per week    Allergies  Allergen Reactions  . Pregabalin     REACTION: dizziness and mental status changes    Current Outpatient Prescriptions  Medication Sig Dispense Refill  . cholecalciferol (VITAMIN D) 1000 UNITS tablet Take 1,000 Units by mouth daily.        Marland Kitchen MAGNESIUM CITRATE PO Take 200 mg by mouth 2 (two) times daily.        . Ascorbic Acid (VITAMIN C) 1000 MG tablet Take 1,000 mg by mouth daily.        . Calcium Carbonate-Vit D-Min (CALCIUM 1200 PO) Take by mouth 2 (two) times daily.        . citalopram (CELEXA) 40 MG tablet Take 1 tablet (40 mg total) by mouth daily.  90 tablet  3  . Coenzyme Q10 200 MG capsule Take 200 mg by mouth daily.        . fish oil-omega-3 fatty acids 1000 MG capsule Take  2 g by mouth daily.        . Glucosamine-Chondroit-Vit C-Mn (GLUCOSAMINE 1500 COMPLEX PO) Take 2,000 mg by mouth daily. 4 tabs per day      . Glucosamine-Chondroitin 500-400 MG CAPS Take 4 tablets by mouth daily.        . methadone (DOLOPHINE) 5 MG tablet Take 5 mg by mouth 3 (three) times daily.       . multivitamin (THERAGRAN) per tablet Take 1 tablet by mouth daily.        . NON FORMULARY 2 (two) times daily. Calcium 650 and magnesium 400       . Nutritional Supplements (ANTI-OXIDANT COMPLEX PO) Take by mouth.        . Olmesartan-Amlodipine-HCTZ (TRIBENZOR) 40-5-12.5 MG TABS Take by mouth daily.        . Probiotic Product (ALIGN) 4 MG CAPS Take by mouth daily.        . psyllium (REGULOID) 0.52 G capsule Take 0.52 g by mouth 2 (two) times daily.        . Red Yeast Rice 600 MG CAPS Take by mouth 2 (two) times daily.       . vitamin B-12 (CYANOCOBALAMIN) 50 MCG tablet Take  50 mcg by mouth daily.          Review of Systems Review of Systems  Constitutional: Negative.   HENT: Negative.   Respiratory: Negative.   Cardiovascular: Negative.   Gastrointestinal: Negative.   Genitourinary: Positive for frequency and testicular pain.  Musculoskeletal: Positive for arthralgias.  Hematological: Negative.     Blood pressure 156/84, pulse 64, temperature 98.6 F (37 C), temperature source Temporal, resp. rate 14, height 5\' 9"  (1.753 m), weight 150 lb 12.8 oz (68.402 kg).  Physical Exam Physical Exam  Constitutional: He appears well-developed and well-nourished. No distress.  Abdominal: Soft. He exhibits no distension and no mass. There is no tenderness.  Genitourinary:       A right groin scar is present. When he coughs I can feel a slight impulse on the finger near his external canal. This is somewhat tender. Left inguinal floor is solid. No testicular masses.  Musculoskeletal: Normal range of motion. He exhibits no edema.  Psychiatric: He has a normal mood and affect. His behavior is normal.    Data Reviewed Dr. Lovell Sheehan note.  Assessment    Right groin pain with radiation to the right testicle and a cough impulse on exam which could indicate weakness or small hernia.    Plan    Pelvic CT to see if he has a second time recurrent hernia. If he does, I recommended laparoscopic repair with mesh.  I have explained the procedure, risks, and aftercare of inguinal hernia repair.  Risks include but are not limited to bleeding, infection, wound problems, anesthesia, recurrence, bladder or intestine injury, urinary retention, testicular dysfunction, chronic pain, mesh problems.  He seems to understand and agrees to proceed.  We will discuss results of the CT scan when they become available to me.         Jimmie Dattilio J 06/13/2011, 2:56 PM

## 2011-06-19 ENCOUNTER — Ambulatory Visit
Admission: RE | Admit: 2011-06-19 | Discharge: 2011-06-19 | Disposition: A | Discharge status: Home / Self Care | Payer: 59 | Source: Ambulatory Visit | Attending: General Surgery | Admitting: General Surgery

## 2011-06-19 DIAGNOSIS — R1031 Right lower quadrant pain: Secondary | ICD-10-CM

## 2011-06-19 MED ORDER — IOHEXOL 300 MG/ML  SOLN
100.0000 mL | Freq: Once | INTRAMUSCULAR | Status: AC | PRN
  Administered 2011-06-19: 100 mL via INTRAVENOUS

## 2011-06-20 ENCOUNTER — Telehealth (INDEPENDENT_AMBULATORY_CARE_PROVIDER_SITE_OTHER): Payer: Self-pay | Admitting: General Surgery

## 2011-06-20 NOTE — Telephone Encounter (Signed)
His CT scan demonstrates a small, recurrent right inguinal hernia containing fat that I could not appreciate when I saw him in the office.  I discussed this with him.  He is interested in scheduling the laparoscopic repair as we discussed at his office visit.  Will work on getting this scheduled.

## 2011-06-28 ENCOUNTER — Encounter: Payer: Self-pay | Admitting: Internal Medicine

## 2011-06-28 ENCOUNTER — Ambulatory Visit (INDEPENDENT_AMBULATORY_CARE_PROVIDER_SITE_OTHER): Payer: 59 | Admitting: Internal Medicine

## 2011-06-28 VITALS — BP 146/90 | HR 60 | Temp 98.2°F | Resp 16 | Ht 69.0 in | Wt 152.0 lb

## 2011-06-28 DIAGNOSIS — E876 Hypokalemia: Secondary | ICD-10-CM

## 2011-06-28 DIAGNOSIS — K5909 Other constipation: Secondary | ICD-10-CM

## 2011-06-28 DIAGNOSIS — M48061 Spinal stenosis, lumbar region without neurogenic claudication: Secondary | ICD-10-CM

## 2011-06-28 DIAGNOSIS — I10 Essential (primary) hypertension: Secondary | ICD-10-CM

## 2011-06-28 DIAGNOSIS — E291 Testicular hypofunction: Secondary | ICD-10-CM

## 2011-06-28 DIAGNOSIS — E785 Hyperlipidemia, unspecified: Secondary | ICD-10-CM

## 2011-06-28 DIAGNOSIS — K4090 Unilateral inguinal hernia, without obstruction or gangrene, not specified as recurrent: Secondary | ICD-10-CM | POA: Insufficient documentation

## 2011-06-28 LAB — BASIC METABOLIC PANEL
CO2: 33 mEq/L — ABNORMAL HIGH (ref 19–32)
Chloride: 98 mEq/L (ref 96–112)
Creatinine, Ser: 0.8 mg/dL (ref 0.4–1.5)
Potassium: 2.9 mEq/L — ABNORMAL LOW (ref 3.5–5.1)
Sodium: 140 mEq/L (ref 135–145)

## 2011-06-28 NOTE — Patient Instructions (Signed)
The patient is instructed to continue all medications as prescribed. Schedule followup with check out clerk upon leaving the clinic  

## 2011-06-28 NOTE — Progress Notes (Signed)
Subjective:    Patient ID: [name], male    DOB: August 19, 1946, 65 y.o.   MRN: [id number]  HPI Hip pain in both hips consist ant with lumbar pain ( L3-4), failed injections, Tens/implant trial failed Changing medications after planned inguinal surgery He currently is on methadone He has hypertension and hyperlipidemia with a history of gastroesophageal reflux all of these problems appear to be stable with exception of hypertension which is elevated today he states that he is in mild to moderate pain   Review of Systems  Constitutional: Negative for fever and fatigue.  HENT: Negative for hearing loss, congestion, neck pain and postnasal drip.   Eyes: Negative for discharge, redness and visual disturbance.  Respiratory: Negative for cough, shortness of breath and wheezing.   Cardiovascular: Negative for leg swelling.  Gastrointestinal: Negative for abdominal pain, constipation and abdominal distention.  Genitourinary: Negative for urgency and frequency.  Musculoskeletal: Negative for joint swelling and arthralgias.  Skin: Negative for color change and rash.  Neurological: Negative for weakness and light-headedness.  Hematological: Negative for adenopathy.  Psychiatric/Behavioral: Negative for behavioral problems.   Past Medical History  Diagnosis Date  . Nocturia   . Essential and other specified forms of tremor   . Hypersomnia with sleep apnea, unspecified   . Abdominal pain, right lower quadrant   . Acute prostatitis   . Spinal stenosis, lumbar region, without neurogenic claudication   . Localized osteoarthrosis not specified whether primary or secondary, lower leg   . Myalgia and myositis, unspecified   . Urinary frequency   . Urinary frequency   . Disuse osteoporosis   . Disuse osteoporosis   . Other testicular hypofunction   . Syncope and collapse   . Hypopotassemia   . Hemarthrosis, upper arm   . Unspecified adverse effect of unspecified drug, medicinal and biological  substance   . Unspecified gastritis and gastroduodenitis without mention of hemorrhage   . Diverticulosis of colon (without mention of hemorrhage)   . Family history of malignant neoplasm of gastrointestinal tract   . Other constipation   . Osteoporosis, unspecified   . Lumbago   . Ganglion and cyst of synovium, tendon, and bursa   . Hypertension   . GERD (gastroesophageal reflux disease)   . Hyperlipidemia   . Constipation   . Degenerative disc disease    Past Surgical History  Procedure Date  . Lumbar surgery 3 times   . Microdisectomy 03/02/2003, 06/29/2003, 12/24/2005  . Hernia repair 1965, 1968    reports that he has never smoked. He has never used smokeless tobacco. He reports that he drinks alcohol. He reports that he does not use illicit drugs. family history includes Cancer in his mother; Dementia in his mother; and Parkinsonism (age of onset:76) in his father. Allergies  Allergen Reactions  . Pregabalin     REACTION: dizziness and mental status changes        Objective:   Physical Exam  Nursing note and vitals reviewed. Constitutional: He appears well-developed and well-nourished.  HENT:  Head: Normocephalic and atraumatic.  Eyes: Conjunctivae are normal. Pupils are equal, round, and reactive to light.  Neck: Normal range of motion. Neck supple.  Cardiovascular: Normal rate and regular rhythm.   Pulmonary/Chest: Effort normal and breath sounds normal.  Abdominal: Soft. Bowel sounds are normal.          Assessment & Plan:  Inguinal hernia ( fat entrapped) increased pain and surgery pain Pain control change to butrans after the surgery  Sleep apnea complication with the methadone.  HTN slight elevation of blood pressure today secondary to mild to moderate pain Up-to-date monitoring of hyperlipidemia gastroesophageal reflux appears to be stable. Constipation stable

## 2011-06-28 NOTE — Assessment & Plan Note (Signed)
Pt will change to butrans after surgery Pt is on methadone but has side effects Opiates caused

## 2011-07-04 ENCOUNTER — Other Ambulatory Visit: Payer: Self-pay | Admitting: *Deleted

## 2011-07-04 MED ORDER — POTASSIUM CHLORIDE 10 MEQ PO TBCR: 10.0000 meq | EXTENDED_RELEASE_TABLET | Freq: Every day | ORAL | Status: DC

## 2011-07-09 ENCOUNTER — Encounter (HOSPITAL_COMMUNITY): Payer: Self-pay | Admitting: Pharmacy Technician

## 2011-07-15 ENCOUNTER — Other Ambulatory Visit (INDEPENDENT_AMBULATORY_CARE_PROVIDER_SITE_OTHER): Payer: Self-pay | Admitting: General Surgery

## 2011-07-16 ENCOUNTER — Ambulatory Visit (HOSPITAL_COMMUNITY)
Admission: RE | Admit: 2011-07-16 | Discharge: 2011-07-16 | Disposition: A | Discharge status: Home / Self Care | Payer: 59 | Source: Ambulatory Visit | Attending: General Surgery | Admitting: General Surgery

## 2011-07-16 ENCOUNTER — Encounter (HOSPITAL_COMMUNITY): Payer: Self-pay

## 2011-07-16 ENCOUNTER — Other Ambulatory Visit: Payer: Self-pay

## 2011-07-16 DIAGNOSIS — Z01812 Encounter for preprocedural laboratory examination: Secondary | ICD-10-CM | POA: Insufficient documentation

## 2011-07-16 DIAGNOSIS — I517 Cardiomegaly: Secondary | ICD-10-CM | POA: Insufficient documentation

## 2011-07-16 DIAGNOSIS — I10 Essential (primary) hypertension: Secondary | ICD-10-CM | POA: Insufficient documentation

## 2011-07-16 DIAGNOSIS — Z01810 Encounter for preprocedural cardiovascular examination: Secondary | ICD-10-CM | POA: Insufficient documentation

## 2011-07-16 DIAGNOSIS — Z01818 Encounter for other preprocedural examination: Secondary | ICD-10-CM | POA: Insufficient documentation

## 2011-07-16 DIAGNOSIS — I498 Other specified cardiac arrhythmias: Secondary | ICD-10-CM | POA: Insufficient documentation

## 2011-07-16 HISTORY — DX: Major depressive disorder, single episode, unspecified: F32.9

## 2011-07-16 HISTORY — DX: Depression, unspecified: F32.A

## 2011-07-16 HISTORY — DX: Sleep apnea, unspecified: G47.30

## 2011-07-16 HISTORY — DX: Anxiety disorder, unspecified: F41.9

## 2011-07-16 LAB — BASIC METABOLIC PANEL
CO2: 35 mEq/L — ABNORMAL HIGH (ref 19–32)
Calcium: 9.5 mg/dL (ref 8.4–10.5)
Chloride: 99 mEq/L (ref 96–112)
Creatinine, Ser: 0.81 mg/dL (ref 0.50–1.35)
Glucose, Bld: 90 mg/dL (ref 70–99)

## 2011-07-16 LAB — CBC
HCT: 38.4 % — ABNORMAL LOW (ref 39.0–52.0)
Hemoglobin: 13.2 g/dL (ref 13.0–17.0)
MCH: 32.2 pg (ref 26.0–34.0)
MCV: 93.7 fL (ref 78.0–100.0)
Platelets: 146 10*3/uL — ABNORMAL LOW (ref 150–400)
RBC: 4.1 MIL/uL — ABNORMAL LOW (ref 4.22–5.81)
WBC: 5.5 10*3/uL (ref 4.0–10.5)

## 2011-07-16 LAB — SURGICAL PCR SCREEN: MRSA, PCR: NEGATIVE

## 2011-07-16 NOTE — Patient Instructions (Signed)
20 NOTNAMED SCHOLZ  07/16/2011   Your procedure is scheduled on: 07/19/11  1200-1330  Friday  Report to [facility] at 1000 AM.  Call this number if you have problems the morning of surgery: [phone]              Deliah Goody in PST [id number]  Remember:   Do not eat food:After Midnight.  Thursday night  May have clear liquids:until Midnight . Thursday night  Clear liquids include soda, tea, black coffee, apple or grape juice, broth.  Take these medicines the morning of surgery with A SIP OF WATER: Celexa, methadone with sip water   Do not wear jewelry, make-up or nail polish.  Do not wear lotions, powders, or perfumes. You may wear deodorant.  Do not shave 48 hours prior to surgery.  Do not bring valuables to the hospital.  Contacts, dentures or bridgework may not be worn into surgery.  Leave suitcase in the car. After surgery it may be brought to your room.  For patients admitted to the hospital, checkout time is 11:00 AM the day of discharge.              BRING BPAP  MASK WITH YOU  Patients discharged the day of surgery will not be allowed to drive home.  Name and phone number of your driver: wife  Special Instructions: CHG Shower Use Special Wash: 1/2 bottle night before surgery and 1/2 bottle morning of surgery. REGULAR SOAP FACE AND PRIVATES   Please read over the following fact sheets that you were given: MRSA Information

## 2011-07-16 NOTE — Pre-Procedure Instructions (Signed)
PAT- instructed to hold vitamins(herbals), antiinflammatories or ASA pre op from today forward

## 2011-07-19 ENCOUNTER — Encounter (HOSPITAL_COMMUNITY)
Admission: RE | Disposition: A | Discharge status: Home / Self Care | Payer: Self-pay | Source: Ambulatory Visit | Attending: General Surgery

## 2011-07-19 ENCOUNTER — Ambulatory Visit (HOSPITAL_COMMUNITY): Payer: 59 | Admitting: Anesthesiology

## 2011-07-19 ENCOUNTER — Encounter (HOSPITAL_COMMUNITY): Payer: Self-pay | Admitting: Anesthesiology

## 2011-07-19 ENCOUNTER — Ambulatory Visit (HOSPITAL_COMMUNITY)
Admission: RE | Admit: 2011-07-19 | Discharge: 2011-07-19 | Disposition: A | Discharge status: Home / Self Care | Payer: 59 | Source: Ambulatory Visit | Attending: General Surgery | Admitting: General Surgery

## 2011-07-19 ENCOUNTER — Encounter (HOSPITAL_COMMUNITY): Payer: Self-pay | Admitting: *Deleted

## 2011-07-19 DIAGNOSIS — I10 Essential (primary) hypertension: Secondary | ICD-10-CM | POA: Insufficient documentation

## 2011-07-19 DIAGNOSIS — K219 Gastro-esophageal reflux disease without esophagitis: Secondary | ICD-10-CM | POA: Insufficient documentation

## 2011-07-19 DIAGNOSIS — E785 Hyperlipidemia, unspecified: Secondary | ICD-10-CM | POA: Insufficient documentation

## 2011-07-19 DIAGNOSIS — K4091 Unilateral inguinal hernia, without obstruction or gangrene, recurrent: Secondary | ICD-10-CM

## 2011-07-19 DIAGNOSIS — G4710 Hypersomnia, unspecified: Secondary | ICD-10-CM | POA: Insufficient documentation

## 2011-07-19 DIAGNOSIS — R1031 Right lower quadrant pain: Secondary | ICD-10-CM

## 2011-07-19 DIAGNOSIS — Z79899 Other long term (current) drug therapy: Secondary | ICD-10-CM | POA: Insufficient documentation

## 2011-07-19 DIAGNOSIS — M810 Age-related osteoporosis without current pathological fracture: Secondary | ICD-10-CM | POA: Insufficient documentation

## 2011-07-19 HISTORY — PX: INGUINAL HERNIA REPAIR: SHX194

## 2011-07-19 SURGERY — REPAIR, HERNIA, INGUINAL, LAPAROSCOPIC
Anesthesia: General | Site: Groin | Laterality: Right | Wound class: Clean

## 2011-07-19 MED ORDER — LACTATED RINGERS IV SOLN: INTRAVENOUS | Status: DC

## 2011-07-19 MED ORDER — ACETAMINOPHEN 325 MG PO TABS: 650.0000 mg | ORAL_TABLET | ORAL | Status: DC | PRN

## 2011-07-19 MED ORDER — ACETAMINOPHEN 10 MG/ML IV SOLN
INTRAVENOUS | Status: DC | PRN
  Administered 2011-07-19: 1000 mg via INTRAVENOUS

## 2011-07-19 MED ORDER — BUPIVACAINE-EPINEPHRINE (PF) 0.5% -1:200000 IJ SOLN
INTRAMUSCULAR | Status: AC
  Filled 2011-07-19: qty 10

## 2011-07-19 MED ORDER — ACETAMINOPHEN 650 MG RE SUPP
650.0000 mg | RECTAL | Status: DC | PRN
  Filled 2011-07-19: qty 1

## 2011-07-19 MED ORDER — FENTANYL CITRATE 0.05 MG/ML IJ SOLN
INTRAMUSCULAR | Status: DC | PRN
  Administered 2011-07-19 (×5): 50 ug via INTRAVENOUS

## 2011-07-19 MED ORDER — CEFAZOLIN SODIUM 1-5 GM-% IV SOLN
1.0000 g | INTRAVENOUS | Status: AC
  Administered 2011-07-19: 1 g via INTRAVENOUS

## 2011-07-19 MED ORDER — SODIUM CHLORIDE 0.9 % IJ SOLN: 3.0000 mL | Freq: Two times a day (BID) | INTRAMUSCULAR | Status: DC

## 2011-07-19 MED ORDER — MORPHINE SULFATE 10 MG/ML IJ SOLN: 2.0000 mg | INTRAMUSCULAR | Status: DC | PRN

## 2011-07-19 MED ORDER — EPHEDRINE SULFATE 50 MG/ML IJ SOLN
INTRAMUSCULAR | Status: DC | PRN
  Administered 2011-07-19 (×2): 5 mg via INTRAVENOUS

## 2011-07-19 MED ORDER — HYDROMORPHONE HCL PF 1 MG/ML IJ SOLN
0.5000 mg | INTRAMUSCULAR | Status: DC | PRN
  Administered 2011-07-19 (×2): 0.5 mg via INTRAVENOUS

## 2011-07-19 MED ORDER — GLYCOPYRROLATE 0.2 MG/ML IJ SOLN
INTRAMUSCULAR | Status: DC | PRN
  Administered 2011-07-19: .6 mg via INTRAVENOUS

## 2011-07-19 MED ORDER — LACTATED RINGERS IV SOLN
INTRAVENOUS | Status: DC | PRN
  Administered 2011-07-19 (×3): via INTRAVENOUS

## 2011-07-19 MED ORDER — OXYCODONE-ACETAMINOPHEN 5-325 MG PO TABS: 1.0000 | ORAL_TABLET | ORAL | Status: AC | PRN

## 2011-07-19 MED ORDER — SUCCINYLCHOLINE CHLORIDE 20 MG/ML IJ SOLN
INTRAMUSCULAR | Status: DC | PRN
  Administered 2011-07-19: 100 mg via INTRAVENOUS

## 2011-07-19 MED ORDER — SODIUM CHLORIDE 0.9 % IJ SOLN: 3.0000 mL | INTRAMUSCULAR | Status: DC | PRN

## 2011-07-19 MED ORDER — OXYCODONE HCL 5 MG PO TABS: 5.0000 mg | ORAL_TABLET | ORAL | Status: DC | PRN

## 2011-07-19 MED ORDER — OXYCODONE-ACETAMINOPHEN 5-325 MG PO TABS: 1.0000 | ORAL_TABLET | ORAL | Status: DC | PRN

## 2011-07-19 MED ORDER — PROMETHAZINE HCL 25 MG/ML IJ SOLN: 12.5000 mg | Freq: Four times a day (QID) | INTRAMUSCULAR | Status: DC | PRN

## 2011-07-19 MED ORDER — ACETAMINOPHEN 10 MG/ML IV SOLN
INTRAVENOUS | Status: AC
  Filled 2011-07-19: qty 100

## 2011-07-19 MED ORDER — ONDANSETRON HCL 4 MG/2ML IJ SOLN
INTRAMUSCULAR | Status: DC | PRN
  Administered 2011-07-19 (×2): 1 mg via INTRAVENOUS
  Administered 2011-07-19: 2 mg via INTRAVENOUS

## 2011-07-19 MED ORDER — LIDOCAINE HCL (CARDIAC) 20 MG/ML IV SOLN
INTRAVENOUS | Status: DC | PRN
  Administered 2011-07-19: 80 mg via INTRAVENOUS

## 2011-07-19 MED ORDER — AMLODIPINE BESYLATE 5 MG PO TABS
5.0000 mg | ORAL_TABLET | Freq: Every day | ORAL | Status: DC
  Administered 2011-07-19: 5 mg via ORAL
  Filled 2011-07-19: qty 1

## 2011-07-19 MED ORDER — LACTATED RINGERS IV SOLN
INTRAVENOUS | Status: DC
  Administered 2011-07-19: 1000 mL via INTRAVENOUS

## 2011-07-19 MED ORDER — HYDROMORPHONE HCL PF 2 MG/ML IJ SOLN
INTRAMUSCULAR | Status: AC
  Filled 2011-07-19: qty 1

## 2011-07-19 MED ORDER — MIDAZOLAM HCL 5 MG/5ML IJ SOLN
INTRAMUSCULAR | Status: DC | PRN
  Administered 2011-07-19: 1 mg via INTRAVENOUS

## 2011-07-19 MED ORDER — PROPOFOL 10 MG/ML IV EMUL
INTRAVENOUS | Status: DC | PRN
  Administered 2011-07-19: 175 mg via INTRAVENOUS

## 2011-07-19 MED ORDER — CISATRACURIUM BESYLATE 2 MG/ML IV SOLN
INTRAVENOUS | Status: DC | PRN
  Administered 2011-07-19: 6 mg via INTRAVENOUS

## 2011-07-19 MED ORDER — SODIUM CHLORIDE 0.9 % IV SOLN: 250.0000 mL | INTRAVENOUS | Status: DC | PRN

## 2011-07-19 MED ORDER — LACTATED RINGERS IR SOLN
Status: DC | PRN
  Administered 2011-07-19: 1000 mL

## 2011-07-19 MED ORDER — PROMETHAZINE HCL 25 MG/ML IJ SOLN: 6.2500 mg | INTRAMUSCULAR | Status: DC | PRN

## 2011-07-19 MED ORDER — BUPIVACAINE-EPINEPHRINE 0.5% -1:200000 IJ SOLN
INTRAMUSCULAR | Status: DC | PRN
  Administered 2011-07-19: 10 mL

## 2011-07-19 MED ORDER — CEFAZOLIN SODIUM 1-5 GM-% IV SOLN
INTRAVENOUS | Status: AC
  Filled 2011-07-19: qty 50

## 2011-07-19 MED ORDER — NEOSTIGMINE METHYLSULFATE 1 MG/ML IJ SOLN
INTRAMUSCULAR | Status: DC | PRN
  Administered 2011-07-19: 4 mg via INTRAVENOUS

## 2011-07-19 MED ORDER — ONDANSETRON HCL 4 MG/2ML IJ SOLN: 4.0000 mg | Freq: Four times a day (QID) | INTRAMUSCULAR | Status: DC | PRN

## 2011-07-19 SURGICAL SUPPLY — 40 items
APL SKNCLS STERI-STRIP NONHPOA (GAUZE/BANDAGES/DRESSINGS) ×1
APPLIER CLIP 5 13 M/L LIGAMAX5 (MISCELLANEOUS) ×2
APR CLP MED LRG 5 ANG JAW (MISCELLANEOUS) ×1
BENZOIN TINCTURE PRP APPL 2/3 (GAUZE/BANDAGES/DRESSINGS) ×2 IMPLANT
CABLE HIGH FREQUENCY MONO STRZ (ELECTRODE) IMPLANT
CHLORAPREP W/TINT 26ML (MISCELLANEOUS) ×2 IMPLANT
CLIP APPLIE 5 13 M/L LIGAMAX5 (MISCELLANEOUS) ×1 IMPLANT
CLOTH BEACON ORANGE TIMEOUT ST (SAFETY) ×2 IMPLANT
DECANTER SPIKE VIAL GLASS SM (MISCELLANEOUS) ×2 IMPLANT
DEVICE SECURE STRAP 25 ABSORB (INSTRUMENTS) ×2 IMPLANT
DISSECT BALLN SPACEMKR + OVL (BALLOONS) ×2
DISSECTOR BALLN SPACEMKR + OVL (BALLOONS) ×1 IMPLANT
DISSECTOR BLUNT TIP ENDO 5MM (MISCELLANEOUS) ×2 IMPLANT
DRAPE LAPAROSCOPIC ABDOMINAL (DRAPES) ×2 IMPLANT
DRAPE UTILITY XL STRL (DRAPES) ×2 IMPLANT
DRSG TEGADERM 2-3/8X2-3/4 SM (GAUZE/BANDAGES/DRESSINGS) ×6 IMPLANT
ELECT REM PT RETURN 9FT ADLT (ELECTROSURGICAL) ×2
ELECTRODE REM PT RTRN 9FT ADLT (ELECTROSURGICAL) ×1 IMPLANT
GAUZE SPONGE 2X2 8PLY STRL LF (GAUZE/BANDAGES/DRESSINGS) ×1 IMPLANT
GLOVE BIOGEL PI IND STRL 7.0 (GLOVE) ×1 IMPLANT
GLOVE BIOGEL PI INDICATOR 7.0 (GLOVE) ×1
GLOVE ECLIPSE 8.0 STRL XLNG CF (GLOVE) ×2 IMPLANT
GLOVE INDICATOR 8.0 STRL GRN (GLOVE) ×4 IMPLANT
GLOVE SURG SS PI 7.0 STRL IVOR (GLOVE) ×4 IMPLANT
GOWN STRL NON-REIN LRG LVL3 (GOWN DISPOSABLE) ×2 IMPLANT
GOWN STRL REIN XL XLG (GOWN DISPOSABLE) ×6 IMPLANT
KIT BASIN OR (CUSTOM PROCEDURE TRAY) ×2 IMPLANT
MESH PARIETEX 6X6 (Mesh General) ×2 IMPLANT
NEEDLE INSUFFLATION 14GA 120MM (NEEDLE) IMPLANT
PEN SKIN MARKING BROAD (MISCELLANEOUS) ×2 IMPLANT
SCISSORS LAP 5X35 DISP (ENDOMECHANICALS) IMPLANT
SET IRRIG TUBING LAPAROSCOPIC (IRRIGATION / IRRIGATOR) ×2 IMPLANT
SOLUTION ANTI FOG 6CC (MISCELLANEOUS) ×2 IMPLANT
SPONGE GAUZE 2X2 STER 10/PKG (GAUZE/BANDAGES/DRESSINGS) ×1
STRIP CLOSURE SKIN 1/2X4 (GAUZE/BANDAGES/DRESSINGS) ×2 IMPLANT
SUT MNCRL AB 4-0 PS2 18 (SUTURE) ×2 IMPLANT
TOWEL OR 17X26 10 PK STRL BLUE (TOWEL DISPOSABLE) ×2 IMPLANT
TRAY LAP CHOLE (CUSTOM PROCEDURE TRAY) ×2 IMPLANT
TROCAR CANNULA W/PORT DUAL 5MM (MISCELLANEOUS) ×4 IMPLANT
TUBING INSUFFLATION 10FT LAP (TUBING) ×2 IMPLANT

## 2011-07-19 NOTE — Anesthesia Preprocedure Evaluation (Addendum)
Anesthesia Evaluation  Patient identified by MRN, date of birth, ID band Patient awake    Reviewed: Allergy & Precautions, H&P , NPO status , Patient's Chart, lab work & pertinent test results  Airway       Dental   Pulmonary neg pulmonary ROS, sleep apnea and Continuous Positive Airway Pressure Ventilation ,          Cardiovascular hypertension, Pt. on medications neg cardio ROS     Neuro/Psych Spinal stenosis.  Chronic pain patient on methadone.  Neuromuscular disease Negative Neurological ROS  Negative Psych ROS   GI/Hepatic negative GI ROS, Neg liver ROS, GERD-  ,  Endo/Other  Negative Endocrine ROS  Renal/GU negative Renal ROS  Genitourinary negative   Musculoskeletal negative musculoskeletal ROS (+)   Abdominal   Peds negative pediatric ROS (+)  Hematology negative hematology ROS (+)   Anesthesia Other Findings   Reproductive/Obstetrics negative OB ROS                          Anesthesia Physical Anesthesia Plan  ASA: III  Anesthesia Plan: General   Post-op Pain Management:    Induction: Intravenous  Airway Management Planned: Oral ETT  Additional Equipment:   Intra-op Plan:   Post-operative Plan: Extubation in OR  Informed Consent: I have reviewed the patients History and Physical, chart, labs and discussed the procedure including the risks, benefits and alternatives for the proposed anesthesia with the patient or authorized representative who has indicated his/her understanding and acceptance.   Dental advisory given  Plan Discussed with: CRNA and Surgeon  Anesthesia Plan Comments:         Anesthesia Quick Evaluation

## 2011-07-19 NOTE — Op Note (Signed)
Operative Note  [name] male 65 y.o. 07/19/2011  PREOPERATIVE DX:  Recurrent right inguinal hernia  POSTOPERATIVE DX:  Same  PROCEDURE:  Laparoscopic repair of recurrent right inguinal hernia with mesh         Surgeon: Adolph Pollack   Assistants: none  Anesthesia: General endotracheal anesthesia  Indications:   This is a 65 year old male with a previous right inguinal hernia repair. He is on chronic methadone for back pain and developed significant constipation. He has been straining to have a bowel movement. Subsequently he developed right groin pain and was tender on exam but no obvious hernia could be found. A pelvic CT did demonstrate a small recurrent inguinal hernia and now he presents for repair of that. The procedure, risks, and after care were discussed with him. We also discussed the fact that this would fix this hernia but may not necessarily relieve his pain and he understands this.    Procedure Detail  He was seen in the holding area in the right groin mark with my initials. He then voided. He was brought to the operating room, placed supine in the operating table, and a general anesthetic was administered. The hair in the abdominal wall and groin was clipped. The area was then sterilely prepped and draped. Marcaine solution was infiltrated in the subumbilical region. A small transverse subumbilical incision was made through the skin and subcutaneous tissue. Using blunt dissection the right anterior rectus sheath was identified. A small transverse incision was made in it. The rectus muscle was swept laterally exposing the posterior rectus sheath. A balloon dissection device was placed in extraperineal space.  Under laparoscopic vision, a balloon dissection was performed of the extraperitoneal space in the lower midline and the right side. The balloon was removed.  Two 5 mm trochars were placed just to the left the lower midline. Using blunt dissection, the symphysis  pubis and Cooper's ligament were identified on the right side. Subcutaneous tissue was dissected Tonche from the anterior and lateral abdominal walls to the level of the umbilicus on the right side.  The direct space was identified and was solid. The indirect space was widened consistent with recurrent indirect hernia. This contained extraperitoneal fat and the peritoneal sac. A window was created around the spermatic cord on the right side. The indirect sac was then dissected Scheurich from the cord using blunt dissection. The extraperitoneal fat was reduced from the hernia defect.  A a piece of parietex 15 cm x 15 cm mesh was brought into the field. 2 cm was cut off the mesh. A partial longitudinal slit was cut into the mesh. The mesh was then placed into the right extraperitoneal space. The 2 tails of the mesh were wrapped around the spermatic cord. The mesh was anchored to Cooper's ligament, the lateral and anterior abdominal walls with absorbable tacks. One of the tacks partially involved the right inferior epigastric artery. This began bleeding but was able to be controlled with hemoclips proximal and distal to the area of bleeding. Blood loss was less than 100 cc.  This area was inspected again and hemostasis was adequate.  The placement of the mesh allowed for adequate coverage of the direct, indirect, and femoral spaces with good overlap.  The inferior lateral aspect of the mesh was held down and the CO2 gas was released. The extraperitoneal contents approximate the mesh. All instruments and trochars were then removed.  The right anterior rectus sheath defect was closed with interrupted 0 Vicryl sutures.  All skin incisions were closed with 4-0 Monocryl subcuticular stitches. Steri-Strips and sterile dressings were applied. He tolerated the procedure well and was taken to the recovery room in satisfactory condition.    Findings:  Indirect recurrent hernia  Estimated Blood Loss:  less than 100 mL          Drains: none          Blood Given: none          Specimens: none        Complications:  * No complications entered in OR log *         Disposition: PACU - hemodynamically stable.         Condition: stable

## 2011-07-19 NOTE — Transfer of Care (Signed)
Immediate Anesthesia Transfer of Care Note  Patient: [name]  Procedure(s) Performed:  LAPAROSCOPIC INGUINAL HERNIA - Laparoscopic Repair of Recurrent Right  Ingunial Hernia with Mesh  Patient Location: PACU  Anesthesia Type: General  Level of Consciousness: awake, oriented, sedated, patient cooperative and responds to stimulation  Airway & Oxygen Therapy: Patient Spontanous Breathing and Patient connected to face mask oxygen  Post-op Assessment: Report given to PACU RN, Post -op Vital signs reviewed and stable and Patient moving all extremities X 4  Post vital signs: Reviewed and stable  Complications: No apparent anesthesia complications

## 2011-07-19 NOTE — Anesthesia Postprocedure Evaluation (Signed)
  Anesthesia Post-op Note  Patient: [name]  Procedure(s) Performed:  LAPAROSCOPIC INGUINAL HERNIA - Laparoscopic Repair of Recurrent Right  Ingunial Hernia with Mesh  Patient Location: PACU  Anesthesia Type: General  Level of Consciousness: awake and alert   Airway and Oxygen Therapy: Patient Spontanous Breathing  Post-op Pain: mild  Post-op Assessment: Post-op Vital signs reviewed, Patient's Cardiovascular Status Stable, Respiratory Function Stable, Patent Airway and No signs of Nausea or vomiting  Post-op Vital Signs: stable  Complications: No apparent anesthesia complications

## 2011-07-19 NOTE — H&P (Signed)
[name] is an 65 y.o. male.   Chief Complaint: Recurrent RIH HPI: 65 year old with a previous right inguinal hernia (RIH) repair who developed right groin pain and has a recurrent RIH. He now presents for laparoscopic repair.  Past Medical History  Diagnosis Date  . Nocturia   . Essential and other specified forms of tremor   . Hypersomnia with sleep apnea, unspecified   . Abdominal pain, right lower quadrant   . Acute prostatitis   . Spinal stenosis, lumbar region, without neurogenic claudication   . Myalgia and myositis, unspecified   . Urinary frequency   . Urinary frequency   . Disuse osteoporosis   . Disuse osteoporosis   . Other testicular hypofunction   . Syncope and collapse   . Hypopotassemia   . Hemarthrosis, upper arm   . Unspecified adverse effect of unspecified drug, medicinal and biological substance   . Unspecified gastritis and gastroduodenitis without mention of hemorrhage   . Diverticulosis of colon (without mention of hemorrhage)   . Family history of malignant neoplasm of gastrointestinal tract   . Other constipation   . Osteoporosis, unspecified   . Lumbago   . Ganglion and cyst of synovium, tendon, and bursa   . Hypertension   . GERD (gastroesophageal reflux disease)   . Hyperlipidemia   . Constipation   . Sleep apnea     last sleep study 11/11 on chart- Bipap with settings of 4 per  patient  . Localized osteoarthrosis not specified whether primary or secondary, lower leg   . Degenerative disc disease   . Anxiety   . Depression     Past Surgical History  Procedure Date  . Lumbar surgery 3 times   . Microdisectomy 03/02/2003, 06/29/2003, 12/24/2005  . Hernia repair 1965, 1968  . Back surgery   . Tonsillectomy     Family History  Problem Relation Age of Onset  . Cancer Mother     colon  . Dementia Mother   . Parkinsonism Father 37   Social History:  reports that he has never smoked. He has never used smokeless tobacco. He reports that he  drinks about 1.8 ounces of alcohol per week. He reports that he does not use illicit drugs.  Allergies:  Allergies  Allergen Reactions  . Pregabalin     REACTION: dizziness and mental status changes    Medications Prior to Admission  Medication Dose Route Frequency Provider Last Rate Last Dose  . amLODipine (NORVASC) tablet 5 mg  5 mg Oral Daily Adolph Pollack, MD   5 mg at 07/19/11 1018  . ceFAZolin (ANCEF) IVPB 1 g/50 mL premix  1 g Intravenous 60 min Pre-Op Adolph Pollack, MD      . lactated ringers infusion   Intravenous Continuous Gaetano Hawthorne, MD 100 mL/hr at 07/19/11 1113 1,000 mL at 07/19/11 1113   Medications Prior to Admission  Medication Sig Dispense Refill  . Ascorbic Acid (VITAMIN C) 1000 MG tablet Take 1,000 mg by mouth daily.       . cholecalciferol (VITAMIN D) 1000 UNITS tablet Take 5,000 Units by mouth daily.       . citalopram (CELEXA) 40 MG tablet Take 20 mg by mouth every morning.       . Coenzyme Q10 200 MG capsule Take 200 mg by mouth daily.       Marland Kitchen MAGNESIUM CITRATE PO Take 200 mg by mouth 2 (two) times daily.       Marland Kitchen  methadone (DOLOPHINE) 5 MG tablet Take 5 mg by mouth 2 (two) times daily.       . Nutritional Supplements (ANTI-OXIDANT COMPLEX PO) Take 10 mg by mouth daily.       . Olmesartan-Amlodipine-HCTZ (TRIBENZOR) 40-5-12.5 MG TABS Take 1 tablet by mouth every morning.       . Probiotic Product (ALIGN) 4 MG CAPS Take by mouth daily.       . psyllium (REGULOID) 0.52 G capsule Take 0.52 g by mouth 2 (two) times daily.       . Red Yeast Rice 600 MG CAPS Take by mouth 2 (two) times daily.       . vitamin B-12 (CYANOCOBALAMIN) 50 MCG tablet Take 50 mcg by mouth daily.         No results found for this or any previous visit (from the past 48 hour(s)). No results found.  Review of Systems  Constitutional: Negative for fever and chills.  HENT: Negative for congestion and sore throat.   Gastrointestinal: Negative for nausea, vomiting and diarrhea.    Neurological: Negative for headaches.    Blood pressure 153/81, pulse 63, temperature 97.5 F (36.4 C), temperature source Oral, resp. rate 16, SpO2 100.00%. Physical Exam  Constitutional: He appears well-developed and well-nourished. No distress.  HENT:  Head: Normocephalic and atraumatic.  Neck: Normal range of motion.  Cardiovascular: Normal rate and regular rhythm.   No murmur heard. Respiratory: Effort normal and breath sounds normal.  GI: Soft. He exhibits no distension and no mass. There is no tenderness.  Genitourinary:       Right groin scar with small bulge.  Musculoskeletal: He exhibits no edema.  Lymphadenopathy:    He has no cervical adenopathy.  Skin: Skin is warm and dry.     Assessment/Plan Recurrent RIH  Plan:  Laparoscopic repair of recurrent RIH.  Procedure, risks, and aftercare have been discussed with him.  Kiarah Eckstein J 07/19/2011, 12:21 PM

## 2011-07-19 NOTE — Interval H&P Note (Signed)
History and Physical Interval Note:  07/19/2011 12:25 PM  [name]  has presented today for surgery, with the diagnosis of  recurrent right ingunial hernia  The various methods of treatment have been discussed with the patient and family. After consideration of risks, benefits and other options for treatment, the patient has consented to  Procedure(s): LAPAROSCOPIC INGUINAL HERNIA as a surgical intervention .  The patients' history has been reviewed, patient examined, no change in status, stable for surgery.  I have reviewed the patients' chart and labs.  Questions were answered to the patient's satisfaction.     Phyllis Abelson [facility]la [facility]

## 2011-07-19 NOTE — Preoperative (Signed)
Beta Blockers   Reason not to administer Beta Blockers:Not Applicable 

## 2011-07-22 ENCOUNTER — Telehealth (INDEPENDENT_AMBULATORY_CARE_PROVIDER_SITE_OTHER): Payer: Self-pay | Admitting: General Surgery

## 2011-07-22 ENCOUNTER — Encounter (HOSPITAL_COMMUNITY): Payer: Self-pay | Admitting: General Surgery

## 2011-07-22 NOTE — Telephone Encounter (Signed)
Called and spoke with the pt concerning his symptoms.  He has no calf swelling at this time, and his BP is stable.  I explained that they place sequential compression devices on the legs during surgery to promote circulation.  He is feeling the soreness from these devices. He states he is voiding and having normal bowel movements.  I encouraged him to keep walking, and to drink plenty of fluids.  He will call if any swelling in the legs develops.

## 2011-07-22 NOTE — Telephone Encounter (Signed)
Patients wife contacted the office concerned that her husbands BP has been elevated over the weekend but has seemed to drop today. He also has bilateral calf pain, and chills, She states he is afebrile. Needs advise

## 2011-07-30 ENCOUNTER — Ambulatory Visit (INDEPENDENT_AMBULATORY_CARE_PROVIDER_SITE_OTHER): Payer: 59 | Admitting: General Surgery

## 2011-07-30 ENCOUNTER — Encounter (INDEPENDENT_AMBULATORY_CARE_PROVIDER_SITE_OTHER): Payer: Self-pay | Admitting: General Surgery

## 2011-07-30 VITALS — BP 138/80 | HR 64 | Temp 97.8°F | Resp 16 | Ht 69.0 in | Wt 151.0 lb

## 2011-07-30 DIAGNOSIS — Z9889 Other specified postprocedural states: Secondary | ICD-10-CM

## 2011-07-30 NOTE — Progress Notes (Signed)
He presents for postop followup after laparoscopic recurrent right inguinal hernia repair with mesh.  Post op pain is improving.  No difficulty voiding or having BMs.  Swelling is decreasing.  P.E.  ABD:  Soft, incisions clean/dry/intact  GU:  Incision clean/dry/intact, swelling is minimal, repair is solid.  Assessment:  Doing well post hernia repair.  Plan:  Continue light activities for 6 weeks postop then slowly start to resume normal activities.  Avoid strenous abdominal exercises for a total of 8 weeks from surgery.  Avoid activities that cause significant discomfort for the long term.  Return visit as needed.

## 2011-07-30 NOTE — Patient Instructions (Signed)
Resume normal activities as tolerated 6 weeks after your surgery.  You may drive when you are pain-[place].

## 2011-07-31 ENCOUNTER — Telehealth (INDEPENDENT_AMBULATORY_CARE_PROVIDER_SITE_OTHER): Payer: Self-pay

## 2011-07-31 NOTE — Telephone Encounter (Signed)
Pt called asking if he had any type of metal inside from his hernia repair.  I let him know that there were tacks holding the mesh in place, but that they would dissolve over time.

## 2011-08-29 ENCOUNTER — Ambulatory Visit (INDEPENDENT_AMBULATORY_CARE_PROVIDER_SITE_OTHER): Payer: 59 | Admitting: Internal Medicine

## 2011-08-29 ENCOUNTER — Encounter: Payer: Self-pay | Admitting: Internal Medicine

## 2011-08-29 VITALS — BP 150/90 | HR 64 | Temp 98.2°F | Resp 16 | Ht 68.0 in | Wt 152.0 lb

## 2011-08-29 DIAGNOSIS — F32A Depression, unspecified: Secondary | ICD-10-CM

## 2011-08-29 DIAGNOSIS — F329 Major depressive disorder, single episode, unspecified: Secondary | ICD-10-CM

## 2011-08-29 DIAGNOSIS — I10 Essential (primary) hypertension: Secondary | ICD-10-CM

## 2011-08-29 DIAGNOSIS — G8929 Other chronic pain: Secondary | ICD-10-CM

## 2011-08-29 DIAGNOSIS — E876 Hypokalemia: Secondary | ICD-10-CM

## 2011-08-29 LAB — BASIC METABOLIC PANEL
CO2: 31 mEq/L (ref 19–32)
Calcium: 9 mg/dL (ref 8.4–10.5)
Chloride: 99 mEq/L (ref 96–112)
Glucose, Bld: 103 mg/dL — ABNORMAL HIGH (ref 70–99)
Potassium: 3 mEq/L — ABNORMAL LOW (ref 3.5–5.1)
Sodium: 140 mEq/L (ref 135–145)

## 2011-08-29 MED ORDER — CITALOPRAM HYDROBROMIDE 40 MG PO TABS: 40.0000 mg | ORAL_TABLET | ORAL | Status: DC

## 2011-08-29 NOTE — Patient Instructions (Signed)
If at all possible I would not want to return to the methadone since we have the cardiac issues on the methadone. I know that the long-acting morphine preparations did not last 12 hours but I would consider using a extended release morphine... perhaps a 3 times a day for his pain control rather than resuming the methadone  This is a "suggestion" it will be up to Dr. Vear Clock who manages her pain to determine the best course for you

## 2011-08-29 NOTE — Progress Notes (Signed)
Subjective:    Patient ID: [name], male    DOB: [birth year], 66 y.o.   MRN: [id number]  HPI Patient is a 66 year old white male who presents for followup of chronic pain followed by the pain clinic and a recent hernia operation.  He is now on a transition between pain medications has significant increase in pain and as noted his blood pressure is increased which I believe is more secondary to the acute pain then 2 blood pressure out of control.  When he was on the methadone he developed a primary central sleep apnea with cardiac arrhythmia.   Review of Systems  Constitutional: Negative for fever and fatigue.  HENT: Negative for hearing loss, congestion, neck pain and postnasal drip.   Eyes: Negative for discharge, redness and visual disturbance.  Respiratory: Negative for cough, shortness of breath and wheezing.   Cardiovascular: Negative for leg swelling.  Gastrointestinal: Negative for abdominal pain, constipation and abdominal distention.  Genitourinary: Negative for urgency and frequency.  Musculoskeletal: Negative for joint swelling and arthralgias.  Skin: Negative for color change and rash.  Neurological: Negative for weakness and light-headedness.  Hematological: Negative for adenopathy.  Psychiatric/Behavioral: Negative for behavioral problems.   Past Medical History  Diagnosis Date  . Nocturia   . Essential and other specified forms of tremor   . Hypersomnia with sleep apnea, unspecified   . Abdominal pain, right lower quadrant   . Acute prostatitis   . Spinal stenosis, lumbar region, without neurogenic claudication   . Myalgia and myositis, unspecified   . Urinary frequency   . Urinary frequency   . Disuse osteoporosis   . Disuse osteoporosis   . Other testicular hypofunction   . Syncope and collapse   . Hypopotassemia   . Hemarthrosis, upper arm   . Unspecified adverse effect of unspecified drug, medicinal and biological substance   . Unspecified gastritis  and gastroduodenitis without mention of hemorrhage   . Diverticulosis of colon (without mention of hemorrhage)   . Family history of malignant neoplasm of gastrointestinal tract   . Other constipation   . Osteoporosis, unspecified   . Lumbago   . Ganglion and cyst of synovium, tendon, and bursa   . Hypertension   . GERD (gastroesophageal reflux disease)   . Hyperlipidemia   . Constipation   . Sleep apnea     last sleep study 11/11 on chart- Bipap with settings of 4 per  patient  . Localized osteoarthrosis not specified whether primary or secondary, lower leg   . Degenerative disc disease   . Anxiety   . Depression   . Right inguinal hernia     History   Social History  . Marital Status: Married    Spouse Name: N/A    Number of Children: N/A  . Years of Education: N/A   Occupational History  . retired    Social History Main Topics  . Smoking status: Never Smoker   . Smokeless tobacco: Never Used  . Alcohol Use: 1.8 oz/week    3 Glasses of wine per week     less than 5 per week  . Drug Use: No  . Sexually Active: Yes   Other Topics Concern  . Not on file   Social History Narrative  . No narrative on file    Past Surgical History  Procedure Date  . Lumbar surgery 3 times   . Microdisectomy 03/02/2003, 06/29/2003, 12/24/2005  . Hernia repair 1965, 1968  . Back surgery   .  Tonsillectomy   . Inguinal hernia repair 07/19/2011    Procedure: LAPAROSCOPIC INGUINAL HERNIA;  Surgeon: Adolph Pollack, MD;  Location: [facility];  Service: General;  Laterality: Right;  Laparoscopic Repair of Recurrent Right  Ingunial Hernia with Mesh    Family History  Problem Relation Age of Onset  . Cancer Mother     colon  . Dementia Mother   . Parkinsonism Father 64    Allergies  Allergen Reactions  . Pregabalin     REACTION: dizziness and mental status changes    Current Outpatient Prescriptions on File Prior to Visit  Medication Sig Dispense Refill  . Ascorbic Acid (VITAMIN  C) 1000 MG tablet Take 1,000 mg by mouth daily.       . Calcium Carbonate-Vitamin D (CALCIUM 600 + D PO) Take 1 tablet by mouth 2 (two) times daily.       . cholecalciferol (VITAMIN D) 1000 UNITS tablet Take 5,000 Units by mouth daily.       . citalopram (CELEXA) 40 MG tablet Take 20 mg by mouth every morning.       . Coenzyme Q10 200 MG capsule Take 200 mg by mouth daily.       Marland Kitchen MAGNESIUM CITRATE PO Take 200 mg by mouth 2 (two) times daily.       . Misc Natural Products (GLUCOS-CHONDROIT-MSM COMPLEX PO) Take 1 tablet by mouth daily.       . Nutritional Supplements (ANTI-OXIDANT COMPLEX PO) Take 10 mg by mouth daily.       . Olmesartan-Amlodipine-HCTZ (TRIBENZOR) 40-5-12.5 MG TABS Take 1 tablet by mouth every morning.       Marland Kitchen OVER THE COUNTER MEDICATION Take 1 capsule by mouth 2 (two) times daily. Fish oil 2126 mg, epa 800 mg, dha 400 mg,      . OVER THE COUNTER MEDICATION Take 1 scoop by mouth 2 (two) times daily. Raw fiber      . OVER THE COUNTER MEDICATION Place 1 application into both eyes 2 (two) times daily as needed. Dry eyes.  Refresh liquid gel      . potassium chloride (KLOR-CON 10) 10 MEQ CR tablet Take 1 tablet (10 mEq total) by mouth daily.  30 tablet  6  . Probiotic Product (ALIGN) 4 MG CAPS Take by mouth daily.       . psyllium (REGULOID) 0.52 G capsule Take 0.52 g by mouth 2 (two) times daily.       . Red Yeast Rice 600 MG CAPS Take by mouth 2 (two) times daily.       . vitamin B-12 (CYANOCOBALAMIN) 50 MCG tablet Take 50 mcg by mouth daily.         BP 150/90  Pulse 64  Temp 98.2 F (36.8 C)  Resp 16  Ht 5\' 8"  (1.727 m)  Wt 152 lb (68.947 kg)  BMI 23.11 kg/m2       Objective:   Physical Exam  Nursing note and vitals reviewed. Constitutional: He appears well-developed and well-nourished.  HENT:  Head: Normocephalic and atraumatic.  Eyes: Conjunctivae are normal. Pupils are equal, round, and reactive to light.  Neck: Normal range of motion. Neck supple.    Cardiovascular: Normal rate and regular rhythm.   Pulmonary/Chest: Effort normal and breath sounds normal.  Abdominal: Soft. Bowel sounds are normal.          Assessment & Plan:  Patient has a significant pain issue and that his tolerance is very high the medications  he recently had hernia surgery with significant increased pain he is now in a transition from a methadone to an alternative that may not have the same respiratory suppression and cardiac arrhythmia risks that methadone displayed for him.  Blood pressure stable  surgical site has tenderness and suture site swelling moniter potassium toda and contine the tribenzor

## 2011-09-03 ENCOUNTER — Other Ambulatory Visit: Payer: Self-pay | Admitting: *Deleted

## 2011-09-03 ENCOUNTER — Telehealth: Payer: Self-pay | Admitting: *Deleted

## 2011-09-03 MED ORDER — POTASSIUM CHLORIDE ER 10 MEQ PO TBCR: 20.0000 meq | EXTENDED_RELEASE_TABLET | Freq: Every day | ORAL | Status: DC

## 2011-09-03 NOTE — Telephone Encounter (Signed)
New script called in

## 2011-10-02 ENCOUNTER — Other Ambulatory Visit: Payer: Self-pay | Admitting: Anesthesiology

## 2011-10-02 DIAGNOSIS — M545 Low back pain: Secondary | ICD-10-CM

## 2011-10-05 ENCOUNTER — Ambulatory Visit
Admission: RE | Admit: 2011-10-05 | Discharge: 2011-10-05 | Disposition: A | Discharge status: Home / Self Care | Payer: 59 | Source: Ambulatory Visit | Attending: Anesthesiology | Admitting: Anesthesiology

## 2011-10-05 DIAGNOSIS — M545 Low back pain: Secondary | ICD-10-CM

## 2011-10-05 MED ORDER — GADOBENATE DIMEGLUMINE 529 MG/ML IV SOLN
17.0000 mL | Freq: Once | INTRAVENOUS | Status: AC | PRN
  Administered 2011-10-05: 17 mL via INTRAVENOUS

## 2011-11-27 ENCOUNTER — Encounter (INDEPENDENT_AMBULATORY_CARE_PROVIDER_SITE_OTHER): Payer: Self-pay | Admitting: General Surgery

## 2011-11-27 ENCOUNTER — Ambulatory Visit (INDEPENDENT_AMBULATORY_CARE_PROVIDER_SITE_OTHER): Payer: 59 | Admitting: General Surgery

## 2011-11-27 VITALS — BP 126/74 | HR 78 | Temp 97.9°F | Resp 14 | Ht 68.0 in | Wt 146.2 lb

## 2011-11-27 DIAGNOSIS — R1031 Right lower quadrant pain: Secondary | ICD-10-CM

## 2011-11-27 NOTE — Patient Instructions (Signed)
Avoid activities that cause you to have groin pain.

## 2011-11-27 NOTE — Progress Notes (Signed)
Patient ID: [name], male   DOB: Jun 18, 1946, 66 y.o.   MRN: [id number]  Chief Complaint  Patient presents with  . Follow-up    HPI [name] is a 66 y.o. male.   HPIHe comes in to see [place] four months after his laparoscopic repair of a recurrent right inguinal hernia. He was doing a lot of activity her couple of weeks ago the next morning woke up with worsening back pain and right groin pain. He has chronic back pain.  He is concerned because the pain felt like it did when he had his recurrent hernia.  Past Medical History  Diagnosis Date  . Nocturia   . Essential and other specified forms of tremor   . Hypersomnia with sleep apnea, unspecified   . Abdominal pain, right lower quadrant   . Acute prostatitis   . Spinal stenosis, lumbar region, without neurogenic claudication   . Myalgia and myositis, unspecified   . Urinary frequency   . Urinary frequency   . Disuse osteoporosis   . Disuse osteoporosis   . Other testicular hypofunction   . Syncope and collapse   . Hypopotassemia   . Hemarthrosis, upper arm   . Unspecified adverse effect of unspecified drug, medicinal and biological substance   . Unspecified gastritis and gastroduodenitis without mention of hemorrhage   . Diverticulosis of colon (without mention of hemorrhage)   . Family history of malignant neoplasm of gastrointestinal tract   . Other constipation   . Osteoporosis, unspecified   . Lumbago   . Ganglion and cyst of synovium, tendon, and bursa   . Hypertension   . GERD (gastroesophageal reflux disease)   . Hyperlipidemia   . Constipation   . Sleep apnea     last sleep study 11/11 on chart- Bipap with settings of 4 per  patient  . Localized osteoarthrosis not specified whether primary or secondary, lower leg   . Degenerative disc disease   . Anxiety   . Depression   . Right inguinal hernia     Past Surgical History  Procedure Date  . Lumbar surgery 3 times   . Microdisectomy 03/02/2003, 06/29/2003,  12/24/2005  . Hernia repair 1965, 1968  . Back surgery   . Tonsillectomy   . Inguinal hernia repair 07/19/2011    Procedure: LAPAROSCOPIC INGUINAL HERNIA;  Surgeon: Adolph Pollack, MD;  Location: [facility];  Service: General;  Laterality: Right;  Laparoscopic Repair of Recurrent Right  Ingunial Hernia with Mesh    Family History  Problem Relation Age of Onset  . Cancer Mother     colon  . Dementia Mother   . Parkinsonism Father 39    Social History History  Substance Use Topics  . Smoking status: Never Smoker   . Smokeless tobacco: Never Used  . Alcohol Use: 1.8 oz/week    3 Glasses of wine per week     less than 5 per week    Allergies  Allergen Reactions  . Pregabalin     REACTION: dizziness and mental status changes    Current Outpatient Prescriptions  Medication Sig Dispense Refill  . Ascorbic Acid (VITAMIN C) 1000 MG tablet Take 1,000 mg by mouth daily.       . Calcium Carbonate-Vitamin D (CALCIUM 600 + D PO) Take 1 tablet by mouth 2 (two) times daily.       . cholecalciferol (VITAMIN D) 1000 UNITS tablet Take 5,000 Units by mouth daily.       Marland Kitchen  citalopram (CELEXA) 40 MG tablet Take 1 tablet (40 mg total) by mouth every morning.  30 tablet  11  . Coenzyme Q10 200 MG capsule Take 200 mg by mouth daily.       Marland Kitchen MAGNESIUM CITRATE PO Take 200 mg by mouth 2 (two) times daily.       . Misc Natural Products (GLUCOS-CHONDROIT-MSM COMPLEX PO) Take 1 tablet by mouth daily.       . Nutritional Supplements (ANTI-OXIDANT COMPLEX PO) Take 10 mg by mouth daily.       . Olmesartan-Amlodipine-HCTZ (TRIBENZOR) 40-5-12.5 MG TABS Take 1 tablet by mouth every morning.       Marland Kitchen OVER THE COUNTER MEDICATION Take 1 capsule by mouth 2 (two) times daily. Fish oil 2126 mg, epa 800 mg, dha 400 mg,      . OVER THE COUNTER MEDICATION Take 1 scoop by mouth 2 (two) times daily. Raw fiber      . OVER THE COUNTER MEDICATION Place 1 application into both eyes 2 (two) times daily as needed. Dry eyes.   Refresh liquid gel      . potassium chloride SA (K-DUR,KLOR-CON) 20 MEQ tablet Take 20 mEq by mouth daily.      . Probiotic Product (ALIGN) 4 MG CAPS Take by mouth daily.       . psyllium (REGULOID) 0.52 G capsule Take 0.52 g by mouth 2 (two) times daily.       . Red Yeast Rice 600 MG CAPS Take by mouth 2 (two) times daily.       . vitamin B-12 (CYANOCOBALAMIN) 50 MCG tablet Take 50 mcg by mouth daily.         Review of Systems Review of Systems  Genitourinary:       Right groin pain per hpi.  No swelling.    Blood pressure 126/74, pulse 78, temperature 97.9 F (36.6 C), temperature source Temporal, resp. rate 14, height 5\' 8"  (1.727 m), weight 146 lb 3.2 oz (66.316 kg).  Physical Exam Physical Exam  Constitutional: He appears well-developed and well-nourished. No distress.  Abdominal: Soft. There is no tenderness.       Well-healed small subumbilical  Incisions.  Genitourinary:       Right inguinal floor is solid with no evidence of recurrent hernia    Data Reviewed:  Operative note  Assessment    Right groin strain following recurrent hernia repair-no clinical evidence of recurrent hernia. He's also aggravated his chronic back pain. These 2 could be somewhat interrelated.    Plan    Light activities until pain and proved. Avoid activities that lead to worsening back pain or groin pain. Return visit p.r.n.       Nalla Purdy J 11/27/2011, 9:32 AM

## 2011-11-28 ENCOUNTER — Encounter: Payer: Self-pay | Admitting: Internal Medicine

## 2011-11-28 ENCOUNTER — Ambulatory Visit (INDEPENDENT_AMBULATORY_CARE_PROVIDER_SITE_OTHER): Payer: 59 | Admitting: Internal Medicine

## 2011-11-28 VITALS — BP 144/84 | HR 70 | Temp 98.6°F | Resp 16 | Ht 68.5 in | Wt 148.0 lb

## 2011-11-28 DIAGNOSIS — E559 Vitamin D deficiency, unspecified: Secondary | ICD-10-CM

## 2011-11-28 DIAGNOSIS — B009 Herpesviral infection, unspecified: Secondary | ICD-10-CM

## 2011-11-28 DIAGNOSIS — M549 Dorsalgia, unspecified: Secondary | ICD-10-CM

## 2011-11-28 DIAGNOSIS — I10 Essential (primary) hypertension: Secondary | ICD-10-CM

## 2011-11-28 DIAGNOSIS — E875 Hyperkalemia: Secondary | ICD-10-CM

## 2011-11-28 LAB — HEPATIC FUNCTION PANEL
ALT: 12 U/L (ref 0–53)
AST: 15 U/L (ref 0–37)
Albumin: 4.5 g/dL (ref 3.5–5.2)
Alkaline Phosphatase: 55 U/L (ref 39–117)
Total Protein: 7.4 g/dL (ref 6.0–8.3)

## 2011-11-28 LAB — BASIC METABOLIC PANEL
Calcium: 9.4 mg/dL (ref 8.4–10.5)
Creatinine, Ser: 0.8 mg/dL (ref 0.4–1.5)
GFR: 104.03 mL/min (ref >60.00)

## 2011-11-28 MED ORDER — METHADONE HCL 5 MG PO TABS: 2.5000 mg | ORAL_TABLET | Freq: Two times a day (BID) | ORAL | Status: DC

## 2011-11-28 MED ORDER — VALACYCLOVIR HCL 500 MG PO TABS: 500.0000 mg | ORAL_TABLET | Freq: Three times a day (TID) | ORAL | Status: DC

## 2011-11-28 NOTE — Patient Instructions (Signed)
The patient is instructed to continue all medications as prescribed. Schedule followup with check out clerk upon leaving the clinic  

## 2011-11-28 NOTE — Progress Notes (Signed)
Subjective:    Patient ID: [name], male    DOB: [birth year], 66 y.o.   MRN: [id number]  HPI Patient has been titrating off the chronic pain medications under the guidance of Dr. Vear Clock for chronic back pain he is now on a very minimum amount of indications 2.5 mg of methadone twice a day.  He states that Dr. Vear Clock mentioned that there seemed to be a significant arthritic component to his pain but did not start him on a nonsteroidal he has a history of essential hypertension with moderately elevated blood pressure so we will cautiously use nonsteroidals and will consider using a nonsteroidal with a protective agent in it before against gastritis since he has a history of GERD and gastritis.    Review of Systems  Constitutional: Negative for fever and fatigue.  HENT: Negative for hearing loss, congestion, neck pain and postnasal drip.   Eyes: Negative for discharge, redness and visual disturbance.  Respiratory: Negative for cough, shortness of breath and wheezing.   Cardiovascular: Negative for leg swelling.  Gastrointestinal: Negative for abdominal pain, constipation and abdominal distention.  Genitourinary: Negative for urgency and frequency.  Musculoskeletal: Negative for joint swelling and arthralgias.  Skin: Negative for color change and rash.  Neurological: Negative for weakness and light-headedness.  Hematological: Negative for adenopathy.  Psychiatric/Behavioral: Negative for behavioral problems.   Past Medical History  Diagnosis Date  . Nocturia   . Essential and other specified forms of tremor   . Hypersomnia with sleep apnea, unspecified   . Abdominal pain, right lower quadrant   . Acute prostatitis   . Spinal stenosis, lumbar region, without neurogenic claudication   . Myalgia and myositis, unspecified   . Urinary frequency   . Urinary frequency   . Disuse osteoporosis   . Disuse osteoporosis   . Other testicular hypofunction   . Syncope and collapse   .  Hypopotassemia   . Hemarthrosis, upper arm   . Unspecified adverse effect of unspecified drug, medicinal and biological substance   . Unspecified gastritis and gastroduodenitis without mention of hemorrhage   . Diverticulosis of colon (without mention of hemorrhage)   . Family history of malignant neoplasm of gastrointestinal tract   . Other constipation   . Osteoporosis, unspecified   . Lumbago   . Ganglion and cyst of synovium, tendon, and bursa   . Hypertension   . GERD (gastroesophageal reflux disease)   . Hyperlipidemia   . Constipation   . Sleep apnea     last sleep study 11/11 on chart- Bipap with settings of 4 per  patient  . Localized osteoarthrosis not specified whether primary or secondary, lower leg   . Degenerative disc disease   . Anxiety   . Depression   . Right inguinal hernia     History   Social History  . Marital Status: Married    Spouse Name: N/A    Number of Children: N/A  . Years of Education: N/A   Occupational History  . retired    Social History Main Topics  . Smoking status: Never Smoker   . Smokeless tobacco: Never Used  . Alcohol Use: 1.8 oz/week    3 Glasses of wine per week     less than 5 per week  . Drug Use: No  . Sexually Active: Yes   Other Topics Concern  . Not on file   Social History Narrative  . No narrative on file    Past Surgical History  Procedure  Date  . Lumbar surgery 3 times   . Microdisectomy 03/02/2003, 06/29/2003, 12/24/2005  . Hernia repair 1965, 1968  . Back surgery   . Tonsillectomy   . Inguinal hernia repair 07/19/2011    Procedure: LAPAROSCOPIC INGUINAL HERNIA;  Surgeon: Adolph Pollack, MD;  Location: [facility];  Service: General;  Laterality: Right;  Laparoscopic Repair of Recurrent Right  Ingunial Hernia with Mesh    Family History  Problem Relation Age of Onset  . Cancer Mother     colon  . Dementia Mother   . Parkinsonism Father 5    Allergies  Allergen Reactions  . Pregabalin      REACTION: dizziness and mental status changes    Current Outpatient Prescriptions on File Prior to Visit  Medication Sig Dispense Refill  . Calcium Carbonate-Vitamin D (CALCIUM 600 + D PO) Take 1 tablet by mouth 2 (two) times daily.       . cholecalciferol (VITAMIN D) 1000 UNITS tablet Take 5,000 Units by mouth daily.       . citalopram (CELEXA) 40 MG tablet Take 1 tablet (40 mg total) by mouth every morning.  30 tablet  11  . Coenzyme Q10 200 MG capsule Take 200 mg by mouth daily.       Marland Kitchen MAGNESIUM CITRATE PO Take 200 mg by mouth 2 (two) times daily.       . Misc Natural Products (GLUCOS-CHONDROIT-MSM COMPLEX PO) Take 1 tablet by mouth daily.       . Nutritional Supplements (ANTI-OXIDANT COMPLEX PO) Take 10 mg by mouth daily.       . Olmesartan-Amlodipine-HCTZ (TRIBENZOR) 40-5-12.5 MG TABS Take 1 tablet by mouth every morning.       Marland Kitchen OVER THE COUNTER MEDICATION Take 1 capsule by mouth 2 (two) times daily. Fish oil 2126 mg, epa 800 mg, dha 400 mg,      . OVER THE COUNTER MEDICATION Take 1 scoop by mouth 2 (two) times daily. Raw fiber      . OVER THE COUNTER MEDICATION Place 1 application into both eyes 2 (two) times daily as needed. Dry eyes.  Refresh liquid gel      . Probiotic Product (ALIGN) 4 MG CAPS Take by mouth daily.       . psyllium (REGULOID) 0.52 G capsule Take 0.52 g by mouth 2 (two) times daily.       . Red Yeast Rice 600 MG CAPS Take by mouth 2 (two) times daily.       . vitamin B-12 (CYANOCOBALAMIN) 50 MCG tablet Take 50 mcg by mouth daily.         BP 144/84  Pulse 70  Temp 98.6 F (37 C)  Resp 16  Ht 5' 8.5" (1.74 m)  Wt 148 lb (67.132 kg)  BMI 22.18 kg/m2       Objective:   Physical Exam  Nursing note and vitals reviewed. Constitutional: He appears well-developed and well-nourished.  HENT:  Head: Normocephalic and atraumatic.  Eyes: Conjunctivae are normal. Pupils are equal, round, and reactive to light.  Neck: Normal range of motion. Neck supple.    Cardiovascular: Normal rate and regular rhythm.   Pulmonary/Chest: Effort normal and breath sounds normal.  Abdominal: Soft. Bowel sounds are normal.  Musculoskeletal: He exhibits tenderness.       Increased back pain          Assessment & Plan:  As he titrates down on the methadone we will add duexis 1 by mouth twice  a day He has fibromyalgia and chronic MSK pain Hernia repair pain, back pain and muscle pain all contribute to his presentation Discussed the use of neurontin Has easy fatigability He remians of magnesium but has cut his potassium

## 2011-11-29 ENCOUNTER — Other Ambulatory Visit: Payer: Self-pay | Admitting: *Deleted

## 2011-11-29 LAB — VITAMIN D 25 HYDROXY (VIT D DEFICIENCY, FRACTURES): Vit D, 25-Hydroxy: 76 ng/mL (ref 30–89)

## 2011-11-29 MED ORDER — POTASSIUM CHLORIDE ER 10 MEQ PO TBCR: EXTENDED_RELEASE_TABLET | ORAL | Status: DC

## 2011-11-29 NOTE — Telephone Encounter (Signed)
Pt informed and k+ called to pharmacy

## 2011-12-05 ENCOUNTER — Telehealth: Payer: Self-pay | Admitting: Internal Medicine

## 2011-12-05 NOTE — Telephone Encounter (Signed)
Dr Lovell Sheehan just wanted to try- any other meds will have to come from pain management

## 2011-12-05 NOTE — Telephone Encounter (Signed)
Duexis samples did not work for his back pain. Pt pharm is [facility] [address]. Please advice

## 2011-12-05 NOTE — Telephone Encounter (Signed)
Pt wants to schedule Bone Density Test, please.

## 2011-12-09 NOTE — Telephone Encounter (Signed)
Please schedule

## 2011-12-09 NOTE — Telephone Encounter (Signed)
May schedule bone density

## 2011-12-09 NOTE — Telephone Encounter (Signed)
Pt is sch for 12-10-2011 11.30am

## 2011-12-10 ENCOUNTER — Ambulatory Visit (INDEPENDENT_AMBULATORY_CARE_PROVIDER_SITE_OTHER)
Admission: RE | Admit: 2011-12-10 | Discharge: 2011-12-10 | Disposition: A | Discharge status: Home / Self Care | Payer: 59 | Source: Ambulatory Visit

## 2011-12-10 ENCOUNTER — Other Ambulatory Visit: Payer: Self-pay | Admitting: *Deleted

## 2011-12-10 DIAGNOSIS — M810 Age-related osteoporosis without current pathological fracture: Secondary | ICD-10-CM

## 2012-01-06 ENCOUNTER — Telehealth: Payer: Self-pay | Admitting: Family Medicine

## 2012-01-06 NOTE — Telephone Encounter (Signed)
I received a request for prior auth from [facility] for his Androgel. I don't see it on his active meds, but it is an old med. Just wanted to let you know, because it looks like he wants a new Rx on it. Thanks!

## 2012-01-07 NOTE — Telephone Encounter (Signed)
[name], the prior auth for the Androgel was denied, because it is a non-formulary Rx. Pt must first try (2) of the following: Androderm, Axiron, [name]. Thanks!

## 2012-01-09 ENCOUNTER — Other Ambulatory Visit: Payer: Self-pay | Admitting: *Deleted

## 2012-01-09 NOTE — Telephone Encounter (Signed)
Talked with pt and he states he no longer takes this medication--

## 2012-01-09 NOTE — Telephone Encounter (Signed)
Done

## 2012-01-09 NOTE — Telephone Encounter (Signed)
Ok - thank you for checking on this.

## 2012-01-15 ENCOUNTER — Other Ambulatory Visit: Payer: Self-pay | Admitting: Internal Medicine

## 2012-02-05 ENCOUNTER — Ambulatory Visit (INDEPENDENT_AMBULATORY_CARE_PROVIDER_SITE_OTHER): Payer: 59 | Admitting: Internal Medicine

## 2012-02-05 ENCOUNTER — Encounter: Payer: Self-pay | Admitting: Internal Medicine

## 2012-02-05 VITALS — BP 120/70 | HR 76 | Temp 98.2°F | Resp 16 | Ht 68.0 in | Wt 148.0 lb

## 2012-02-05 DIAGNOSIS — M810 Age-related osteoporosis without current pathological fracture: Secondary | ICD-10-CM

## 2012-02-05 DIAGNOSIS — G8929 Other chronic pain: Secondary | ICD-10-CM

## 2012-02-05 DIAGNOSIS — S32000A Wedge compression fracture of unspecified lumbar vertebra, initial encounter for closed fracture: Secondary | ICD-10-CM

## 2012-02-05 DIAGNOSIS — S32009A Unspecified fracture of unspecified lumbar vertebra, initial encounter for closed fracture: Secondary | ICD-10-CM

## 2012-02-05 DIAGNOSIS — E291 Testicular hypofunction: Secondary | ICD-10-CM

## 2012-02-05 DIAGNOSIS — M549 Dorsalgia, unspecified: Secondary | ICD-10-CM

## 2012-02-05 LAB — BASIC METABOLIC PANEL
CO2: 31 mEq/L (ref 19–32)
Calcium: 9.1 mg/dL (ref 8.4–10.5)
Creatinine, Ser: 0.9 mg/dL (ref 0.4–1.5)
Glucose, Bld: 98 mg/dL (ref 70–99)

## 2012-02-05 MED ORDER — TERIPARATIDE (RECOMBINANT) 600 MCG/2.4ML ~~LOC~~ SOLN: 20.0000 ug | Freq: Every day | SUBCUTANEOUS | Status: DC

## 2012-02-05 MED ORDER — TESTOSTERONE 30 MG/ACT TD SOLN: 1.0000 | Freq: Every day | TRANSDERMAL | Status: DC

## 2012-02-05 NOTE — Progress Notes (Signed)
  Subjective:    Patient ID: [name], male    DOB: 22-Apr-1946, 66 y.o.   MRN: [id number]  HPI Vision has marked osteoporosis of the hips and also a possible compression changed to the lumbar vertebrae that indicates that he is at high risk for osteoporosis and has an osteoporosis related fracture.  The alternatives for osteoporosis are for him reclast for which he has taken and did not respond. Prolia injections every 6 months Daily injection of forteo The patient has low testosterone and had not been able to tolerate/ cost/use the testosterone   Review of Systems  Constitutional: Negative for fever and fatigue.  HENT: Negative for hearing loss, congestion, neck pain and postnasal drip.   Eyes: Negative for discharge, redness and visual disturbance.  Respiratory: Negative for cough, shortness of breath and wheezing.   Cardiovascular: Negative for leg swelling.  Gastrointestinal: Negative for abdominal pain, constipation and abdominal distention.  Genitourinary: Negative for urgency and frequency.  Musculoskeletal: Negative for joint swelling and arthralgias.  Skin: Negative for color change and rash.  Neurological: Negative for weakness and light-headedness.  Hematological: Negative for adenopathy.  Psychiatric/Behavioral: Negative for behavioral problems.       The patient is instructed to continue all medications as prescribed. Schedule followup with check out clerk upon leaving the clinic  Objective:   Physical Exam  Nursing note and vitals reviewed. Constitutional: He appears well-developed and well-nourished.  HENT:  Head: Normocephalic and atraumatic.  Eyes: Conjunctivae are normal. Pupils are equal, round, and reactive to light.  Neck: Normal range of motion. Neck supple.  Cardiovascular: Normal rate and regular rhythm.   Pulmonary/Chest: Effort normal and breath sounds normal.  Abdominal: Soft. Bowel sounds are normal.          Assessment & Plan:  Patient  has known hypogonadism was on a prepared cream from a formulating pharmacy and had tried AndroGel but he did not tolerate the use of the medication.  We will try the axillary applied testosterone for a two-month period of time and repeat the testosterone levels at that time.  The patient also has moderately severe osteoporosis and a compression fracture complicating his osteoporosis we will measure calcium magnesium and basic metabolic panel today and initiate therapy with Forteo.

## 2012-02-14 ENCOUNTER — Telehealth: Payer: Self-pay | Admitting: Internal Medicine

## 2012-02-14 NOTE — Telephone Encounter (Signed)
[facility] sent pre-authorization papers to be filled out and they have received papers back.  Form requires a 1 year trial of another medicine such as Fosamax, etc that failed in therapy prior to ordering new med.   Please contact pharmacy at  [phone] with this information - Please reference number  [phone].

## 2012-02-14 NOTE — Telephone Encounter (Signed)
Please advise rx for [employer]

## 2012-02-17 ENCOUNTER — Other Ambulatory Visit: Payer: Self-pay | Admitting: *Deleted

## 2012-02-17 MED ORDER — ALENDRONATE SODIUM 70 MG PO TABS: 70.0000 mg | ORAL_TABLET | ORAL | Status: DC

## 2012-02-17 NOTE — Telephone Encounter (Signed)
[name], I wasn't sure if you've been made aware. Looks like he must try another med first. I don't know what is on his formulary, however. Thanks.

## 2012-02-17 NOTE — Telephone Encounter (Signed)
Per dr Lovell Sheehan- take foxamax 70 1 every week-pt informed and med called in--holly will check on axiron pa

## 2012-02-18 NOTE — Telephone Encounter (Signed)
Axiron approved.

## 2012-02-28 ENCOUNTER — Ambulatory Visit: Payer: 59 | Admitting: Internal Medicine

## 2012-03-09 ENCOUNTER — Ambulatory Visit (INDEPENDENT_AMBULATORY_CARE_PROVIDER_SITE_OTHER): Payer: 59 | Admitting: General Surgery

## 2012-03-09 ENCOUNTER — Encounter (INDEPENDENT_AMBULATORY_CARE_PROVIDER_SITE_OTHER): Payer: Self-pay | Admitting: General Surgery

## 2012-03-09 VITALS — BP 138/86 | HR 54 | Temp 98.9°F | Ht 68.0 in | Wt 149.0 lb

## 2012-03-09 DIAGNOSIS — R1031 Right lower quadrant pain: Secondary | ICD-10-CM

## 2012-03-09 NOTE — Patient Instructions (Signed)
Have the physical therapist doing stretching exercises for the right groin.

## 2012-03-09 NOTE — Progress Notes (Signed)
Subjective:     Patient ID: [name], male   DOB: 10-16-1945, 66 y.o.   MRN: [id number]  HPIHe is here for another visit regarding his chronic right groin pain. This pain has been present prior to his laparoscopic repair of a recurrent right inguinal hernia. He had a brief relief of this pain following the surgery but now is back to it's usual level.  It is at one site just above his previous open scar.   Review of Systems He has urinary frequency and constipation issues.  He has severe osteoporosis of both hips.    Objective:   Physical Exam Abdomen-soft, right groin scars present. Superior to the lateral aspect of the scar is an area of point tenderness and no palpable abnormality. The inguinal hernia repair is solid.    Assessment:     Chronic right lower quadrant/groin pain-Could be referred pain from his right hip osteoarthrosis, pre-existing and persistent pain from the previous hernia, or pain from a fixation device (tack).    Plan:     He is going to see a physical therapist saw the cold him to have the physical therapist to do lower abdominal and right groin stretching exercises. Return visit in 6 weeks. If pain persists then we'll have him see Dr. Vear Clock regarding injecting the area for local anesthetic affect.

## 2012-03-11 ENCOUNTER — Other Ambulatory Visit: Payer: Self-pay | Admitting: *Deleted

## 2012-03-11 DIAGNOSIS — M810 Age-related osteoporosis without current pathological fracture: Secondary | ICD-10-CM

## 2012-03-11 MED ORDER — TERIPARATIDE (RECOMBINANT) 600 MCG/2.4ML ~~LOC~~ SOLN: 20.0000 ug | Freq: Every day | SUBCUTANEOUS | Status: DC

## 2012-03-13 ENCOUNTER — Other Ambulatory Visit: Payer: Self-pay | Admitting: *Deleted

## 2012-03-16 ENCOUNTER — Encounter: Payer: Self-pay | Admitting: *Deleted

## 2012-03-16 ENCOUNTER — Telehealth: Payer: Self-pay | Admitting: Family Medicine

## 2012-03-16 NOTE — Telephone Encounter (Signed)
Thank you :)

## 2012-03-16 NOTE — Telephone Encounter (Signed)
[name], [name]'s FORTEO was approved. I called him, and he is aware. Thanks!

## 2012-03-31 ENCOUNTER — Encounter: Payer: Self-pay | Admitting: Gastroenterology

## 2012-04-14 ENCOUNTER — Encounter: Payer: Self-pay | Admitting: Internal Medicine

## 2012-04-14 ENCOUNTER — Ambulatory Visit (INDEPENDENT_AMBULATORY_CARE_PROVIDER_SITE_OTHER): Payer: 59 | Admitting: Internal Medicine

## 2012-04-14 VITALS — BP 136/82 | HR 60 | Temp 98.7°F | Wt 148.0 lb

## 2012-04-14 DIAGNOSIS — D519 Vitamin B12 deficiency anemia, unspecified: Secondary | ICD-10-CM

## 2012-04-14 DIAGNOSIS — I10 Essential (primary) hypertension: Secondary | ICD-10-CM

## 2012-04-14 DIAGNOSIS — E876 Hypokalemia: Secondary | ICD-10-CM

## 2012-04-14 DIAGNOSIS — E291 Testicular hypofunction: Secondary | ICD-10-CM

## 2012-04-14 DIAGNOSIS — G250 Essential tremor: Secondary | ICD-10-CM

## 2012-04-14 DIAGNOSIS — M818 Other osteoporosis without current pathological fracture: Secondary | ICD-10-CM

## 2012-04-14 DIAGNOSIS — D518 Other vitamin B12 deficiency anemias: Secondary | ICD-10-CM

## 2012-04-14 LAB — BASIC METABOLIC PANEL
BUN: 24 mg/dL — ABNORMAL HIGH (ref 6–23)
Chloride: 100 mEq/L (ref 96–112)
Creatinine, Ser: 1.1 mg/dL (ref 0.4–1.5)
GFR: 70.18 mL/min (ref >60.00)

## 2012-04-14 LAB — TESTOSTERONE: Testosterone: 216.55 ng/dL — ABNORMAL LOW (ref 350.00–890.00)

## 2012-04-14 NOTE — Patient Instructions (Addendum)
Stretch IT band For groin  "frog stretch"

## 2012-04-14 NOTE — Progress Notes (Signed)
Subjective:    Patient ID: [name], male    DOB: 04-17-1946, 66 y.o.   MRN: [id number]  HPI On the injection therapy forteo for the osteoporosis Back pain persists working with trainer fro core training reviewed testosterone results Potassium was low    Review of Systems  Constitutional: Negative for fever and fatigue.  HENT: Negative for hearing loss, congestion, neck pain and postnasal drip.   Eyes: Negative for discharge, redness and visual disturbance.  Respiratory: Negative for cough, shortness of breath and wheezing.   Cardiovascular: Negative for leg swelling.  Gastrointestinal: Negative for abdominal pain, constipation and abdominal distention.  Genitourinary: Negative for urgency and frequency.  Musculoskeletal: Negative for joint swelling and arthralgias.  Skin: Negative for color change and rash.  Neurological: Negative for weakness and light-headedness.  Hematological: Negative for adenopathy.  Psychiatric/Behavioral: Negative for behavioral problems.   Past Medical History  Diagnosis Date  . Nocturia   . Essential and other specified forms of tremor   . Hypersomnia with sleep apnea, unspecified   . Abdominal pain, right lower quadrant   . Acute prostatitis   . Spinal stenosis, lumbar region, without neurogenic claudication   . Myalgia and myositis, unspecified   . Urinary frequency   . Urinary frequency   . Disuse osteoporosis   . Disuse osteoporosis   . Other testicular hypofunction   . Syncope and collapse   . Hypopotassemia   . Hemarthrosis, upper arm   . Unspecified adverse effect of unspecified drug, medicinal and biological substance   . Unspecified gastritis and gastroduodenitis without mention of hemorrhage   . Diverticulosis of colon (without mention of hemorrhage)   . Family history of malignant neoplasm of gastrointestinal tract   . Other constipation   . Osteoporosis, unspecified   . Lumbago   . Ganglion and cyst of synovium, tendon, and  bursa   . Hypertension   . GERD (gastroesophageal reflux disease)   . Hyperlipidemia   . Constipation   . Sleep apnea     last sleep study 11/11 on chart- Bipap with settings of 4 per  patient  . Localized osteoarthrosis not specified whether primary or secondary, lower leg   . Degenerative disc disease   . Anxiety   . Depression   . Right inguinal hernia     History   Social History  . Marital Status: Married    Spouse Name: N/A    Number of Children: N/A  . Years of Education: N/A   Occupational History  . retired    Social History Main Topics  . Smoking status: Never Smoker   . Smokeless tobacco: Never Used  . Alcohol Use: 1.8 oz/week    3 Glasses of wine per week     less than 5 per week  . Drug Use: No  . Sexually Active: Yes   Other Topics Concern  . Not on file   Social History Narrative  . No narrative on file    Past Surgical History  Procedure Date  . Lumbar surgery 3 times   . Microdisectomy 03/02/2003, 06/29/2003, 12/24/2005  . Hernia repair 1965, 1968  . Back surgery   . Tonsillectomy   . Inguinal hernia repair 07/19/2011    Procedure: LAPAROSCOPIC INGUINAL HERNIA;  Surgeon: Adolph Pollack, MD;  Location: [facility];  Service: General;  Laterality: Right;  Laparoscopic Repair of Recurrent Right  Ingunial Hernia with Mesh    Family History  Problem Relation Age of Onset  .  Colon cancer Mother   . Dementia Mother   . Parkinsonism Father 38    Allergies  Allergen Reactions  . Pregabalin     REACTION: dizziness and mental status changes    Current Outpatient Prescriptions on File Prior to Visit  Medication Sig Dispense Refill  . Calcium Carbonate-Vitamin D (CALCIUM 600 + D PO) Take 1 tablet by mouth 2 (two) times daily.       . cholecalciferol (VITAMIN D) 1000 UNITS tablet Take 5,000 Units by mouth daily.       . citalopram (CELEXA) 40 MG tablet Take 1 tablet (40 mg total) by mouth every morning.  30 tablet  11  . Coenzyme Q10 200 MG capsule  Take 200 mg by mouth daily.       Marland Kitchen MAGNESIUM CITRATE PO Take 200 mg by mouth 2 (two) times daily.       . methadone (DOLOPHINE) 5 MG tablet Take 5 mg by mouth 2 (two) times daily.      . Misc Natural Products (GLUCOS-CHONDROIT-MSM COMPLEX PO) Take 1 tablet by mouth daily.       . Nutritional Supplements (ANTI-OXIDANT COMPLEX PO) Take 10 mg by mouth daily.       . Olmesartan-Amlodipine-HCTZ (TRIBENZOR) 40-5-12.5 MG TABS Take 1 tablet by mouth every morning.       Marland Kitchen OVER THE COUNTER MEDICATION Take 1 capsule by mouth 2 (two) times daily. Fish oil 2126 mg, epa 800 mg, dha 400 mg,      . OVER THE COUNTER MEDICATION Take 1 scoop by mouth 2 (two) times daily. Raw fiber      . OVER THE COUNTER MEDICATION Place 1 application into both eyes 2 (two) times daily as needed. Dry eyes.  Refresh liquid gel      . potassium chloride (K-DUR) 10 MEQ tablet 1 twice a day for 3 days and then 1 every dat  33 tablet  6  . Probiotic Product (ALIGN) 4 MG CAPS Take by mouth daily.       . psyllium (REGULOID) 0.52 G capsule Take 0.52 g by mouth 2 (two) times daily.       . Red Yeast Rice 600 MG CAPS Take by mouth 2 (two) times daily.       . Teriparatide, Recombinant, (FORTEO) 600 MCG/2.4ML SOLN Inject 0.08 mLs (20 mcg total) into the skin daily.  2.4 mL  11  . Teriparatide, Recombinant, 600 MCG/2.4ML SOLN Inject 0.08 mLs (20 mcg total) into the skin daily.  2.4 mL  11  . Testosterone (AXIRON) 30 MG/ACT SOLN Place 1 Applicatorful onto the skin daily.  90 mL  5  . valACYclovir (VALTREX) 500 MG tablet Take 500 mg by mouth 3 (three) times daily as needed.      . vitamin B-12 (CYANOCOBALAMIN) 50 MCG tablet Take 50 mcg by mouth daily.         BP 136/82  Pulse 60  Temp 98.7 F (37.1 C) (Oral)  Wt 148 lb (67.132 kg)       Objective:   Physical Exam  Nursing note and vitals reviewed. Constitutional: He appears well-developed and well-nourished.  HENT:  Head: Normocephalic and atraumatic.  Eyes: Conjunctivae are  normal. Pupils are equal, round, and reactive to light.  Neck: Normal range of motion. Neck supple.  Cardiovascular: Normal rate and regular rhythm.   Pulmonary/Chest: Effort normal and breath sounds normal.  Abdominal: Soft. Bowel sounds are normal.  Assessment & Plan:  Reviewed patient's current pain control regimen Discussed the role of dietary gluten and inflammation in affected gluten Rallo diet might assist him in his overall inflammatory profile.  We discussed allergies allergic rhinitis and seasonal allergy symptoms.  Reviewed his hypogonadism Stable hypertension GERD continues to require a PPI or H2 blocker

## 2012-04-29 ENCOUNTER — Encounter (INDEPENDENT_AMBULATORY_CARE_PROVIDER_SITE_OTHER): Payer: 59 | Admitting: General Surgery

## 2012-04-30 ENCOUNTER — Encounter (INDEPENDENT_AMBULATORY_CARE_PROVIDER_SITE_OTHER): Payer: 59 | Admitting: General Surgery

## 2012-05-13 ENCOUNTER — Other Ambulatory Visit: Payer: Self-pay | Admitting: *Deleted

## 2012-05-13 MED ORDER — CITALOPRAM HYDROBROMIDE 40 MG PO TABS: 40.0000 mg | ORAL_TABLET | ORAL | Status: DC

## 2012-06-30 ENCOUNTER — Ambulatory Visit (INDEPENDENT_AMBULATORY_CARE_PROVIDER_SITE_OTHER): Payer: 59 | Admitting: Internal Medicine

## 2012-06-30 ENCOUNTER — Encounter: Payer: Self-pay | Admitting: Internal Medicine

## 2012-06-30 VITALS — BP 144/80 | HR 72 | Temp 98.2°F | Resp 16 | Ht 68.0 in | Wt 148.0 lb

## 2012-06-30 DIAGNOSIS — T887XXA Unspecified adverse effect of drug or medicament, initial encounter: Secondary | ICD-10-CM

## 2012-06-30 DIAGNOSIS — E291 Testicular hypofunction: Secondary | ICD-10-CM

## 2012-06-30 DIAGNOSIS — M549 Dorsalgia, unspecified: Secondary | ICD-10-CM

## 2012-06-30 DIAGNOSIS — M818 Other osteoporosis without current pathological fracture: Secondary | ICD-10-CM

## 2012-06-30 LAB — BASIC METABOLIC PANEL
BUN: 25 mg/dL — ABNORMAL HIGH (ref 6–23)
CO2: 34 mEq/L — ABNORMAL HIGH (ref 19–32)
Chloride: 97 mEq/L (ref 96–112)
Creatinine, Ser: 1 mg/dL (ref 0.4–1.5)

## 2012-06-30 LAB — CALCIUM: Calcium: 9.9 mg/dL (ref 8.4–10.5)

## 2012-06-30 MED ORDER — CELECOXIB 200 MG PO CAPS: 200.0000 mg | ORAL_CAPSULE | Freq: Two times a day (BID) | ORAL | Status: DC

## 2012-06-30 NOTE — Patient Instructions (Signed)
"  practical paleo" 

## 2012-06-30 NOTE — Progress Notes (Signed)
Subjective:    Patient ID: [name], male    DOB: 1946/01/24, 66 y.o.   MRN: [id number]  HPI  Follow up chronic pain Off of methadone... As of today Blood pressure stable with tribenzor Hx of scoliosis Dr Noel Gerold following back pain and pain management Stretching and back exercises Continues forteo for osteoporosis     Review of Systems  Constitutional: Negative for fever and fatigue.  HENT: Negative for hearing loss, congestion, neck pain and postnasal drip.   Eyes: Negative for discharge, redness and visual disturbance.  Respiratory: Negative for cough, shortness of breath and wheezing.   Cardiovascular: Negative for leg swelling.  Gastrointestinal: Negative for abdominal pain, constipation and abdominal distention.  Genitourinary: Negative for urgency and frequency.  Musculoskeletal: Negative for joint swelling and arthralgias.  Skin: Negative for color change and rash.  Neurological: Negative for weakness and light-headedness.  Hematological: Negative for adenopathy.  Psychiatric/Behavioral: Negative for behavioral problems.   Past Medical History  Diagnosis Date  . Nocturia   . Essential and other specified forms of tremor   . Hypersomnia with sleep apnea, unspecified   . Abdominal pain, right lower quadrant   . Acute prostatitis   . Spinal stenosis, lumbar region, without neurogenic claudication   . Myalgia and myositis, unspecified   . Urinary frequency   . Urinary frequency   . Disuse osteoporosis   . Disuse osteoporosis   . Other testicular hypofunction   . Syncope and collapse   . Hypopotassemia   . Hemarthrosis, upper arm   . Unspecified adverse effect of unspecified drug, medicinal and biological substance   . Unspecified gastritis and gastroduodenitis without mention of hemorrhage   . Diverticulosis of colon (without mention of hemorrhage)   . Family history of malignant neoplasm of gastrointestinal tract   . Other constipation   . Osteoporosis,  unspecified   . Lumbago   . Ganglion and cyst of synovium, tendon, and bursa(727.4)   . Hypertension   . GERD (gastroesophageal reflux disease)   . Hyperlipidemia   . Constipation   . Sleep apnea     last sleep study 11/11 on chart- Bipap with settings of 4 per  patient  . Localized osteoarthrosis not specified whether primary or secondary, lower leg   . Degenerative disc disease   . Anxiety   . Depression   . Right inguinal hernia     History   Social History  . Marital Status: Married    Spouse Name: N/A    Number of Children: N/A  . Years of Education: N/A   Occupational History  . retired    Social History Main Topics  . Smoking status: Never Smoker   . Smokeless tobacco: Never Used  . Alcohol Use: 1.8 oz/week    3 Glasses of wine per week     Comment: less than 5 per week  . Drug Use: No  . Sexually Active: Yes   Other Topics Concern  . Not on file   Social History Narrative  . No narrative on file    Past Surgical History  Procedure Date  . Lumbar surgery 3 times   . Microdisectomy 03/02/2003, 06/29/2003, 12/24/2005  . Hernia repair 1965, 1968  . Back surgery   . Tonsillectomy   . Inguinal hernia repair 07/19/2011    Procedure: LAPAROSCOPIC INGUINAL HERNIA;  Surgeon: Adolph Pollack, MD;  Location: [facility];  Service: General;  Laterality: Right;  Laparoscopic Repair of Recurrent Right  Ingunial Hernia with  Mesh    Family History  Problem Relation Age of Onset  . Colon cancer Mother   . Dementia Mother   . Parkinsonism Father 68    Allergies  Allergen Reactions  . Pregabalin     REACTION: dizziness and mental status changes    Current Outpatient Prescriptions on File Prior to Visit  Medication Sig Dispense Refill  . Calcium Carbonate-Vitamin D (CALCIUM 600 + D PO) Take 1 tablet by mouth 2 (two) times daily.       . cholecalciferol (VITAMIN D) 1000 UNITS tablet Take 5,000 Units by mouth daily.       . citalopram (CELEXA) 40 MG tablet Take 1  tablet (40 mg total) by mouth every morning.  90 tablet  3  . Coenzyme Q10 200 MG capsule Take 200 mg by mouth daily.       Marland Kitchen MAGNESIUM CITRATE PO Take 200 mg by mouth 2 (two) times daily.       . methadone (DOLOPHINE) 5 MG tablet Take 5 mg by mouth 2 (two) times daily.      . Misc Natural Products (GLUCOS-CHONDROIT-MSM COMPLEX PO) Take 1 tablet by mouth daily.       . Nutritional Supplements (ANTI-OXIDANT COMPLEX PO) Take 10 mg by mouth daily.       . Olmesartan-Amlodipine-HCTZ (TRIBENZOR) 40-5-12.5 MG TABS Take 1 tablet by mouth every morning.       Marland Kitchen OVER THE COUNTER MEDICATION Take 1 capsule by mouth 2 (two) times daily. Fish oil 2126 mg, epa 800 mg, dha 400 mg,      . OVER THE COUNTER MEDICATION Take 1 scoop by mouth 2 (two) times daily. Raw fiber      . OVER THE COUNTER MEDICATION Place 1 application into both eyes 2 (two) times daily as needed. Dry eyes.  Refresh liquid gel      . potassium chloride (K-DUR) 10 MEQ tablet 1 twice a day for 3 days and then 1 every dat  33 tablet  6  . Probiotic Product (ALIGN) 4 MG CAPS Take by mouth daily.       . psyllium (REGULOID) 0.52 G capsule Take 0.52 g by mouth 2 (two) times daily.       . Red Yeast Rice 600 MG CAPS Take by mouth 2 (two) times daily.       . Teriparatide, Recombinant, (FORTEO) 600 MCG/2.4ML SOLN Inject 0.08 mLs (20 mcg total) into the skin daily.  2.4 mL  11  . Teriparatide, Recombinant, 600 MCG/2.4ML SOLN Inject 0.08 mLs (20 mcg total) into the skin daily.  2.4 mL  11  . Testosterone (AXIRON) 30 MG/ACT SOLN Place 1 Applicatorful onto the skin daily.  90 mL  5  . valACYclovir (VALTREX) 500 MG tablet Take 500 mg by mouth 3 (three) times daily as needed.      . vitamin B-12 (CYANOCOBALAMIN) 50 MCG tablet Take 50 mcg by mouth daily.         BP 144/80  Pulse 72  Temp 98.2 F (36.8 C)  Resp 16  Ht 5\' 8"  (1.727 m)  Wt 148 lb (67.132 kg)  BMI 22.50 kg/m2       Objective:   Physical Exam  Constitutional: He appears  well-developed and well-nourished.  HENT:  Head: Normocephalic and atraumatic.  Eyes: Conjunctivae normal are normal. Pupils are equal, round, and reactive to light.  Neck: Normal range of motion. Neck supple.  Cardiovascular: Normal rate and regular rhythm.   Pulmonary/Chest:  Effort normal and breath sounds normal.  Abdominal: Soft. Bowel sounds are normal.          Assessment & Plan:  Trial of nsaids for pain monitoring cal vit d for osteoporosis screen for parathyroid  Stable HTN

## 2012-07-06 ENCOUNTER — Other Ambulatory Visit: Payer: Self-pay | Admitting: *Deleted

## 2012-07-06 MED ORDER — POTASSIUM CHLORIDE ER 10 MEQ PO TBCR: 10.0000 meq | EXTENDED_RELEASE_TABLET | Freq: Every day | ORAL | Status: DC

## 2012-07-15 ENCOUNTER — Ambulatory Visit: Payer: 59 | Admitting: Internal Medicine

## 2012-07-15 ENCOUNTER — Other Ambulatory Visit: Payer: Self-pay | Admitting: *Deleted

## 2012-07-15 MED ORDER — OLMESARTAN-AMLODIPINE-HCTZ 40-5-12.5 MG PO TABS: 1.0000 | ORAL_TABLET | ORAL | Status: DC

## 2012-07-22 ENCOUNTER — Other Ambulatory Visit: Payer: Self-pay | Admitting: *Deleted

## 2012-08-20 ENCOUNTER — Other Ambulatory Visit: Payer: Self-pay | Admitting: *Deleted

## 2012-08-20 MED ORDER — TESTOSTERONE 20.25 MG/1.25GM (1.62%) TD GEL: 2.0000 | Freq: Every day | TRANSDERMAL | Status: DC

## 2012-09-07 ENCOUNTER — Telehealth: Payer: Self-pay | Admitting: Internal Medicine

## 2012-09-07 NOTE — Telephone Encounter (Signed)
[name] - I show patient has tried Androgel, Axiron, and testosterone cypionate. Per his insurance, they will not approve the Androgel until he also tried BOTH: Androderm and [place]. Please let me know if he has a contraindication to these 2 products. Otherwise, one will need to be sent in to his pharmacy, as they will not pay for his Androgel 1.62%.

## 2012-09-07 NOTE — Telephone Encounter (Signed)
[facility]

## 2012-09-07 NOTE — Telephone Encounter (Signed)
Thanks much 

## 2012-09-29 ENCOUNTER — Other Ambulatory Visit: Payer: Self-pay | Admitting: *Deleted

## 2012-09-29 MED ORDER — OLMESARTAN-AMLODIPINE-HCTZ 40-5-12.5 MG PO TABS: 1.0000 | ORAL_TABLET | ORAL | Status: DC

## 2012-10-03 ENCOUNTER — Other Ambulatory Visit: Payer: Self-pay

## 2012-10-05 ENCOUNTER — Ambulatory Visit (INDEPENDENT_AMBULATORY_CARE_PROVIDER_SITE_OTHER): Payer: 59 | Admitting: Internal Medicine

## 2012-10-05 ENCOUNTER — Encounter: Payer: Self-pay | Admitting: Internal Medicine

## 2012-10-05 VITALS — BP 160/90 | HR 72 | Temp 98.2°F | Resp 16 | Ht 68.0 in | Wt 152.0 lb

## 2012-10-05 DIAGNOSIS — G8929 Other chronic pain: Secondary | ICD-10-CM

## 2012-10-05 DIAGNOSIS — R109 Unspecified abdominal pain: Secondary | ICD-10-CM

## 2012-10-05 DIAGNOSIS — I10 Essential (primary) hypertension: Secondary | ICD-10-CM

## 2012-10-05 DIAGNOSIS — M549 Dorsalgia, unspecified: Secondary | ICD-10-CM

## 2012-10-05 MED ORDER — INDOMETHACIN 50 MG PO CAPS: 50.0000 mg | ORAL_CAPSULE | Freq: Three times a day (TID) | ORAL | Status: DC

## 2012-10-05 MED ORDER — TAPENTADOL HCL 50 MG PO TABS: 50.0000 mg | ORAL_TABLET | Freq: Four times a day (QID) | ORAL | Status: DC | PRN

## 2012-10-05 MED ORDER — NEBIVOLOL HCL 5 MG PO TABS: 5.0000 mg | ORAL_TABLET | Freq: Every day | ORAL | Status: DC

## 2012-10-05 NOTE — Progress Notes (Signed)
Subjective:    Patient ID: [name], male    DOB: 1946/02/22, 67 y.o.   MRN: [id number]  HPI Elevated pain and blood pressure elevations Does not want to continue going to Dr Vear Clock  No chest pain Back pain 9/10 off aff medication on celebrex only Testosterone  Levels due   Review of Systems  Constitutional: Negative for fever and fatigue.  HENT: Negative for hearing loss, congestion, neck pain and postnasal drip.   Eyes: Negative for discharge, redness and visual disturbance.  Respiratory: Negative for cough, shortness of breath and wheezing.   Cardiovascular: Negative for leg swelling.  Gastrointestinal: Negative for abdominal pain, constipation and abdominal distention.  Genitourinary: Negative for urgency and frequency.  Musculoskeletal: Positive for myalgias, back pain, joint swelling, arthralgias and gait problem.  Skin: Negative for color change and rash.  Neurological: Negative for weakness and light-headedness.  Hematological: Negative for adenopathy.  Psychiatric/Behavioral: Negative for behavioral problems.   Past Medical History  Diagnosis Date  . Nocturia   . Essential and other specified forms of tremor   . Hypersomnia with sleep apnea, unspecified   . Abdominal pain, right lower quadrant   . Acute prostatitis   . Spinal stenosis, lumbar region, without neurogenic claudication   . Myalgia and myositis, unspecified   . Urinary frequency   . Urinary frequency   . Disuse osteoporosis   . Disuse osteoporosis   . Other testicular hypofunction   . Syncope and collapse   . Hypopotassemia   . Hemarthrosis, upper arm   . Unspecified adverse effect of unspecified drug, medicinal and biological substance   . Unspecified gastritis and gastroduodenitis without mention of hemorrhage   . Diverticulosis of colon (without mention of hemorrhage)   . Family history of malignant neoplasm of gastrointestinal tract   . Other constipation   . Osteoporosis, unspecified    . Lumbago   . Ganglion and cyst of synovium, tendon, and bursa(727.4)   . Hypertension   . GERD (gastroesophageal reflux disease)   . Hyperlipidemia   . Constipation   . Sleep apnea     last sleep study 11/11 on chart- Bipap with settings of 4 per  patient  . Localized osteoarthrosis not specified whether primary or secondary, lower leg   . Degenerative disc disease   . Anxiety   . Depression   . Right inguinal hernia     History   Social History  . Marital Status: Married    Spouse Name: N/A    Number of Children: N/A  . Years of Education: N/A   Occupational History  . retired    Social History Main Topics  . Smoking status: Never Smoker   . Smokeless tobacco: Never Used  . Alcohol Use: 1.8 oz/week    3 Glasses of wine per week     Comment: less than 5 per week  . Drug Use: No  . Sexually Active: Yes   Other Topics Concern  . Not on file   Social History Narrative  . No narrative on file    Past Surgical History  Procedure Laterality Date  . Lumbar surgery 3 times    . Microdisectomy  03/02/2003, 06/29/2003, 12/24/2005  . Hernia repair  1965, 1968  . Back surgery    . Tonsillectomy    . Inguinal hernia repair  07/19/2011    Procedure: LAPAROSCOPIC INGUINAL HERNIA;  Surgeon: Adolph Pollack, MD;  Location: [facility];  Service: General;  Laterality: Right;  Laparoscopic Repair  of Recurrent Right  Ingunial Hernia with Mesh    Family History  Problem Relation Age of Onset  . Colon cancer Mother   . Dementia Mother   . Parkinsonism Father 66    Allergies  Allergen Reactions  . Pregabalin     REACTION: dizziness and mental status changes    Current Outpatient Prescriptions on File Prior to Visit  Medication Sig Dispense Refill  . Calcium Carbonate-Vitamin D (CALCIUM 600 + D PO) Take 1 tablet by mouth 2 (two) times daily.       . cholecalciferol (VITAMIN D) 1000 UNITS tablet Take 5,000 Units by mouth daily.       . citalopram (CELEXA) 40 MG tablet Take 1  tablet (40 mg total) by mouth every morning.  90 tablet  3  . Coenzyme Q10 200 MG capsule Take 200 mg by mouth daily.       Marland Kitchen MAGNESIUM CITRATE PO Take 200 mg by mouth 2 (two) times daily.       . Misc Natural Products (GLUCOS-CHONDROIT-MSM COMPLEX PO) Take 1 tablet by mouth daily.       . Nutritional Supplements (ANTI-OXIDANT COMPLEX PO) Take 10 mg by mouth daily.       . Olmesartan-Amlodipine-HCTZ (TRIBENZOR) 40-5-12.5 MG TABS Take 1 tablet by mouth every morning.  90 tablet  3  . OVER THE COUNTER MEDICATION Take 1 capsule by mouth 2 (two) times daily. Fish oil 2126 mg, epa 800 mg, dha 400 mg,      . OVER THE COUNTER MEDICATION Take 1 scoop by mouth 2 (two) times daily. Raw fiber      . OVER THE COUNTER MEDICATION Place 1 application into both eyes 2 (two) times daily as needed. Dry eyes.  Refresh liquid gel      . potassium chloride (K-DUR) 10 MEQ tablet Take 1 tablet (10 mEq total) by mouth daily. 1 twice a day for 3 days and then 1 every dat  90 tablet  3  . Probiotic Product (ALIGN) 4 MG CAPS Take by mouth daily.       . psyllium (REGULOID) 0.52 G capsule Take 0.52 g by mouth 2 (two) times daily.       . Red Yeast Rice 600 MG CAPS Take by mouth 2 (two) times daily.       . Teriparatide, Recombinant, (FORTEO) 600 MCG/2.4ML SOLN Inject 0.08 mLs (20 mcg total) into the skin daily.  2.4 mL  11  . Teriparatide, Recombinant, 600 MCG/2.4ML SOLN Inject 0.08 mLs (20 mcg total) into the skin daily.  2.4 mL  11  . Testosterone (ANDROGEL) 20.25 MG/1.25GM (1.62%) GEL Place 2 Squirts onto the skin daily.  1.25 g  5  . valACYclovir (VALTREX) 500 MG tablet Take 500 mg by mouth 3 (three) times daily as needed.      . vitamin B-12 (CYANOCOBALAMIN) 50 MCG tablet Take 50 mcg by mouth daily.        No current facility-administered medications on file prior to visit.    BP 160/90  Pulse 72  Temp(Src) 98.2 F (36.8 C)  Resp 16  Ht 5\' 8"  (1.727 m)  Wt 152 lb (68.947 kg)  BMI 23.12 kg/m2        Objective:   Physical Exam  Constitutional: He appears well-developed and well-nourished.  HENT:  Head: Normocephalic and atraumatic.  Eyes: Conjunctivae are normal. Pupils are equal, round, and reactive to light.  Neck: Normal range of motion. Neck supple.  Cardiovascular: Normal  rate and regular rhythm.   Pulmonary/Chest: Effort normal and breath sounds normal.  Abdominal: Soft. Bowel sounds are normal.          Assessment & Plan:  Elevated blood pressure due to pain will add BB  Samples of bystollic to try and rx for nucynta for breakthrough and use of indocin for maintenance possible hernia pain from seroma

## 2012-10-05 NOTE — Patient Instructions (Addendum)
Change to indocin up to three times a day always with food nucynta for the breakthrough

## 2012-10-07 ENCOUNTER — Telehealth: Payer: Self-pay | Admitting: *Deleted

## 2012-10-07 ENCOUNTER — Other Ambulatory Visit: Payer: Self-pay | Admitting: Internal Medicine

## 2012-10-07 ENCOUNTER — Ambulatory Visit (INDEPENDENT_AMBULATORY_CARE_PROVIDER_SITE_OTHER)
Admission: RE | Admit: 2012-10-07 | Discharge: 2012-10-07 | Disposition: A | Discharge status: Home / Self Care | Payer: 59 | Source: Ambulatory Visit | Attending: Internal Medicine | Admitting: Internal Medicine

## 2012-10-07 NOTE — Telephone Encounter (Signed)
Pt called and stated he fell on ice several days ago and now is c/o sever rib pain-per dr Lovell Sheehan- go get xray at [facility]

## 2012-10-12 ENCOUNTER — Telehealth: Payer: Self-pay | Admitting: *Deleted

## 2012-10-12 MED ORDER — AMLODIPINE BESYLATE 5 MG PO TABS: 5.0000 mg | ORAL_TABLET | Freq: Every day | ORAL | Status: DC

## 2012-10-12 NOTE — Telephone Encounter (Signed)
Pt calls stating dr Lovell Sheehan started him on bystolic 5 mg last ov for hypertension- bp still elevated ,but pulse is slow- pulse 44-50 and bp 18-87 and 175-75--per dr Lovell Sheehan add norvasc 5 mg-pt informed

## 2012-10-20 ENCOUNTER — Other Ambulatory Visit: Payer: Self-pay | Admitting: *Deleted

## 2012-10-20 MED ORDER — OLMESARTAN-AMLODIPINE-HCTZ 40-5-12.5 MG PO TABS: 1.0000 | ORAL_TABLET | ORAL | Status: DC

## 2012-11-26 ENCOUNTER — Encounter: Payer: Self-pay | Admitting: Gastroenterology

## 2012-12-11 ENCOUNTER — Encounter: Payer: Self-pay | Admitting: Gastroenterology

## 2012-12-30 ENCOUNTER — Other Ambulatory Visit: Payer: Self-pay | Admitting: *Deleted

## 2012-12-30 MED ORDER — AMLODIPINE BESYLATE 5 MG PO TABS: 5.0000 mg | ORAL_TABLET | Freq: Every day | ORAL | Status: DC

## 2013-01-04 ENCOUNTER — Encounter: Payer: Self-pay | Admitting: Internal Medicine

## 2013-01-04 ENCOUNTER — Ambulatory Visit (INDEPENDENT_AMBULATORY_CARE_PROVIDER_SITE_OTHER): Payer: 59 | Admitting: Internal Medicine

## 2013-01-04 VITALS — BP 140/80 | HR 60 | Temp 98.2°F | Resp 16 | Ht 68.0 in | Wt 148.0 lb

## 2013-01-04 DIAGNOSIS — T887XXA Unspecified adverse effect of drug or medicament, initial encounter: Secondary | ICD-10-CM

## 2013-01-04 DIAGNOSIS — E039 Hypothyroidism, unspecified: Secondary | ICD-10-CM

## 2013-01-04 DIAGNOSIS — G8929 Other chronic pain: Secondary | ICD-10-CM

## 2013-01-04 DIAGNOSIS — M549 Dorsalgia, unspecified: Secondary | ICD-10-CM

## 2013-01-04 DIAGNOSIS — I10 Essential (primary) hypertension: Secondary | ICD-10-CM

## 2013-01-04 DIAGNOSIS — E291 Testicular hypofunction: Secondary | ICD-10-CM

## 2013-01-04 LAB — CBC WITH DIFFERENTIAL/PLATELET
Basophils Relative: 0.3 % (ref 0.0–3.0)
Eosinophils Absolute: 0.1 10*3/uL (ref 0.0–0.7)
HCT: 41.1 % (ref 39.0–52.0)
Lymphocytes Relative: 27.8 % (ref 12.0–46.0)
Lymphs Abs: 1.6 10*3/uL (ref 0.7–4.0)
MCHC: 34.6 g/dL (ref 30.0–36.0)
Neutro Abs: 3.6 10*3/uL (ref 1.4–7.7)
WBC: 5.9 10*3/uL (ref 4.5–10.5)

## 2013-01-04 LAB — HEPATIC FUNCTION PANEL
Albumin: 4.1 g/dL (ref 3.5–5.2)
Alkaline Phosphatase: 73 U/L (ref 39–117)

## 2013-01-04 LAB — BASIC METABOLIC PANEL
CO2: 32 mEq/L (ref 19–32)
Chloride: 100 mEq/L (ref 96–112)
Creatinine, Ser: 1.1 mg/dL (ref 0.4–1.5)
Potassium: 3 mEq/L — ABNORMAL LOW (ref 3.5–5.1)

## 2013-01-04 LAB — TESTOSTERONE: Testosterone: 274.51 ng/dL — ABNORMAL LOW (ref 350.00–890.00)

## 2013-01-04 LAB — TSH: TSH: 1.63 u[IU]/mL (ref 0.35–5.50)

## 2013-01-04 MED ORDER — OLMESARTAN-AMLODIPINE-HCTZ 40-10-12.5 MG PO TABS: 1.0000 | ORAL_TABLET | Freq: Every day | ORAL | Status: DC

## 2013-01-04 MED ORDER — TAPENTADOL HCL 50 MG PO TABS: 50.0000 mg | ORAL_TABLET | Freq: Four times a day (QID) | ORAL | Status: DC | PRN

## 2013-01-04 NOTE — Patient Instructions (Signed)
The patient is instructed to continue all medications as prescribed. Schedule followup with check out clerk upon leaving the clinic  

## 2013-01-04 NOTE — Addendum Note (Signed)
Addended by: [name] Fava on: 01/04/2013 12:44 PM   Modules accepted: Orders

## 2013-01-04 NOTE — Progress Notes (Signed)
Subjective:    Patient ID: [name], male    DOB: Apr 01, 1946, 67 y.o.   MRN: [id number]  HPI Stable blood pressure with average reading  130 systollic and the diastollic are under 80     Review of Systems  Constitutional: Negative for fever and fatigue.  HENT: Negative for hearing loss, congestion, neck pain and postnasal drip.   Eyes: Negative for discharge, redness and visual disturbance.  Respiratory: Positive for shortness of breath. Negative for cough and wheezing.   Cardiovascular: Negative for leg swelling.  Gastrointestinal: Negative for abdominal pain, constipation and abdominal distention.  Genitourinary: Negative for urgency and frequency.  Musculoskeletal: Positive for myalgias, back pain, joint swelling and arthralgias.  Skin: Negative for color change and rash.  Neurological: Negative for weakness and light-headedness.  Hematological: Negative for adenopathy.  Psychiatric/Behavioral: Negative for behavioral problems.       Past Medical History  Diagnosis Date  . Nocturia   . Essential and other specified forms of tremor   . Hypersomnia with sleep apnea, unspecified   . Abdominal pain, right lower quadrant   . Acute prostatitis   . Spinal stenosis, lumbar region, without neurogenic claudication   . Myalgia and myositis, unspecified   . Urinary frequency   . Urinary frequency   . Disuse osteoporosis   . Disuse osteoporosis   . Other testicular hypofunction   . Syncope and collapse   . Hypopotassemia   . Hemarthrosis, upper arm   . Unspecified adverse effect of unspecified drug, medicinal and biological substance   . Unspecified gastritis and gastroduodenitis without mention of hemorrhage   . Diverticulosis of colon (without mention of hemorrhage)   . Family history of malignant neoplasm of gastrointestinal tract   . Other constipation   . Osteoporosis, unspecified   . Lumbago   . Ganglion and cyst of synovium, tendon, and bursa(727.4)   .  Hypertension   . GERD (gastroesophageal reflux disease)   . Hyperlipidemia   . Constipation   . Sleep apnea     last sleep study 11/11 on chart- Bipap with settings of 4 per  patient  . Localized osteoarthrosis not specified whether primary or secondary, lower leg   . Degenerative disc disease   . Anxiety   . Depression   . Right inguinal hernia     History   Social History  . Marital Status: Married    Spouse Name: N/A    Number of Children: N/A  . Years of Education: N/A   Occupational History  . retired    Social History Main Topics  . Smoking status: Never Smoker   . Smokeless tobacco: Never Used  . Alcohol Use: 1.8 oz/week    3 Glasses of wine per week     Comment: less than 5 per week  . Drug Use: No  . Sexually Active: Yes   Other Topics Concern  . Not on file   Social History Narrative  . No narrative on file    Past Surgical History  Procedure Laterality Date  . Lumbar surgery 3 times    . Microdisectomy  03/02/2003, 06/29/2003, 12/24/2005  . Hernia repair  1965, 1968  . Back surgery    . Tonsillectomy    . Inguinal hernia repair  07/19/2011    Procedure: LAPAROSCOPIC INGUINAL HERNIA;  Surgeon: Adolph Pollack, MD;  Location: [facility];  Service: General;  Laterality: Right;  Laparoscopic Repair of Recurrent Right  Ingunial Hernia with Mesh  Family History  Problem Relation Age of Onset  . Colon cancer Mother   . Dementia Mother   . Parkinsonism Father 77    Allergies  Allergen Reactions  . Pregabalin     REACTION: dizziness and mental status changes    Current Outpatient Prescriptions on File Prior to Visit  Medication Sig Dispense Refill  . amLODipine (NORVASC) 5 MG tablet Take 1 tablet (5 mg total) by mouth daily.  90 tablet  3  . Calcium Carbonate-Vitamin D (CALCIUM 600 + D PO) Take 1 tablet by mouth 2 (two) times daily.       . cholecalciferol (VITAMIN D) 1000 UNITS tablet Take 5,000 Units by mouth daily.       . citalopram (CELEXA) 40  MG tablet Take 1 tablet (40 mg total) by mouth every morning.  90 tablet  3  . Coenzyme Q10 200 MG capsule Take 200 mg by mouth daily.       Marland Kitchen MAGNESIUM CITRATE PO Take 200 mg by mouth 2 (two) times daily.       . Misc Natural Products (GLUCOS-CHONDROIT-MSM COMPLEX PO) Take 1 tablet by mouth daily.       . Nutritional Supplements (ANTI-OXIDANT COMPLEX PO) Take 10 mg by mouth daily.       . Olmesartan-Amlodipine-HCTZ (TRIBENZOR) 40-5-12.5 MG TABS Take 1 tablet by mouth every morning.  90 tablet  3  . OVER THE COUNTER MEDICATION Take 1 capsule by mouth 2 (two) times daily. Fish oil 2126 mg, epa 800 mg, dha 400 mg,      . OVER THE COUNTER MEDICATION Take 1 scoop by mouth 2 (two) times daily. Raw fiber      . OVER THE COUNTER MEDICATION Place 1 application into both eyes 2 (two) times daily as needed. Dry eyes.  Refresh liquid gel      . potassium chloride (K-DUR) 10 MEQ tablet Take 1 tablet (10 mEq total) by mouth daily. 1 twice a day for 3 days and then 1 every dat  90 tablet  3  . Probiotic Product (ALIGN) 4 MG CAPS Take by mouth daily.       . psyllium (REGULOID) 0.52 G capsule Take 0.52 g by mouth 2 (two) times daily.       . Red Yeast Rice 600 MG CAPS Take by mouth 2 (two) times daily.       . tapentadol (NUCYNTA) 50 MG TABS Take 1 tablet (50 mg total) by mouth every 6 (six) hours as needed.  60 each  0  . Teriparatide, Recombinant, (FORTEO) 600 MCG/2.4ML SOLN Inject 0.08 mLs (20 mcg total) into the skin daily.  2.4 mL  11  . Teriparatide, Recombinant, 600 MCG/2.4ML SOLN Inject 0.08 mLs (20 mcg total) into the skin daily.  2.4 mL  11  . Testosterone (ANDROGEL) 20.25 MG/1.25GM (1.62%) GEL Place 2 Squirts onto the skin daily.  1.25 g  5  . vitamin B-12 (CYANOCOBALAMIN) 50 MCG tablet Take 50 mcg by mouth daily.       . valACYclovir (VALTREX) 500 MG tablet Take 500 mg by mouth 3 (three) times daily as needed.       No current facility-administered medications on file prior to visit.    BP 160/84   Pulse 60  Temp(Src) 98.2 F (36.8 C)  Resp 16  Ht 5\' 8"  (1.727 m)  Wt 148 lb (67.132 kg)  BMI 22.51 kg/m2    Objective:   Physical Exam  Nursing note and vitals  reviewed. Constitutional: He appears well-developed and well-nourished.  HENT:  Head: Normocephalic.  Eyes: Conjunctivae are normal. Pupils are equal, round, and reactive to light.  Cardiovascular:  Murmur heard.         Assessment & Plan:  bystolic regulated the pulse the norvasc helped at first Elevations are related to pain in some cases Indomethacin did not help The nucyenta helps best  moniter blood pressure  Stable blood pressure and pulse Check labs

## 2013-01-19 ENCOUNTER — Encounter: Payer: Self-pay | Admitting: Gastroenterology

## 2013-01-19 ENCOUNTER — Ambulatory Visit (AMBULATORY_SURGERY_CENTER): Payer: 59 | Admitting: *Deleted

## 2013-01-19 VITALS — Ht 69.0 in | Wt 148.8 lb

## 2013-01-19 DIAGNOSIS — Z800 Family history of malignant neoplasm of digestive organs: Secondary | ICD-10-CM

## 2013-01-19 DIAGNOSIS — Z1211 Encounter for screening for malignant neoplasm of colon: Secondary | ICD-10-CM

## 2013-01-19 MED ORDER — NA SULFATE-K SULFATE-MG SULF 17.5-3.13-1.6 GM/177ML PO SOLN: 1.0000 | Freq: Once | ORAL | Status: DC

## 2013-01-19 NOTE — Progress Notes (Signed)
Pt denies allergies to eggs or soy products. Denies difficulty with anesthesia or sedation.

## 2013-02-02 ENCOUNTER — Ambulatory Visit (AMBULATORY_SURGERY_CENTER): Payer: 59 | Admitting: Gastroenterology

## 2013-02-02 ENCOUNTER — Encounter: Payer: Self-pay | Admitting: Gastroenterology

## 2013-02-02 VITALS — BP 146/67 | HR 49 | Temp 98.2°F | Resp 27 | Ht 69.0 in | Wt 148.0 lb

## 2013-02-02 DIAGNOSIS — D126 Benign neoplasm of colon, unspecified: Secondary | ICD-10-CM

## 2013-02-02 DIAGNOSIS — Z1211 Encounter for screening for malignant neoplasm of colon: Secondary | ICD-10-CM

## 2013-02-02 DIAGNOSIS — Z800 Family history of malignant neoplasm of digestive organs: Secondary | ICD-10-CM

## 2013-02-02 DIAGNOSIS — K6389 Other specified diseases of intestine: Secondary | ICD-10-CM

## 2013-02-02 MED ORDER — SODIUM CHLORIDE 0.9 % IV SOLN: 500.0000 mL | INTRAVENOUS | Status: DC

## 2013-02-02 NOTE — Progress Notes (Signed)
Janalee Dane, RN from research in to speak with the pt and his wife while in the recovery room. Maw

## 2013-02-02 NOTE — Progress Notes (Signed)
Called to room to assist during endoscopic procedure.  Patient ID and intended procedure confirmed with present staff. Received instructions for my participation in the procedure from the performing physician.  

## 2013-02-02 NOTE — Progress Notes (Signed)
Report to pacu rn, vss, bbs=clear 

## 2013-02-02 NOTE — Progress Notes (Signed)
I did advise the pt not to take asa or asa products for 2 weeks since hot cautery was used. Maw  Patient did not experience any of the following events: a burn prior to discharge; a fall within the facility; wrong site/side/patient/procedure/implant event; or a hospital transfer or hospital admission upon discharge from the facility. ([id number])Patient did not have preoperative order for IV antibiotic SSI prophylaxis. [phone])

## 2013-02-02 NOTE — Progress Notes (Signed)
No complaints noted in the recovery room. maw 

## 2013-02-02 NOTE — Patient Instructions (Addendum)
Handout was given to your care partner on polyps.  You may resume your current medications today.  Please call if any questions or concerns.    YOU HAD AN ENDOSCOPIC PROCEDURE TODAY AT THE [facility]: Refer to the procedure report that was given to you for any specific questions about what was found during the examination.  If the procedure report does not answer your questions, please call your gastroenterologist to clarify.  If you requested that your care partner not be given the details of your procedure findings, then the procedure report has been included in a sealed envelope for you to review at your convenience later.  YOU SHOULD EXPECT: Some feelings of bloating in the abdomen. Passage of more gas than usual.  Walking can help get rid of the air that was put into your GI tract during the procedure and reduce the bloating. If you had a lower endoscopy (such as a colonoscopy or flexible sigmoidoscopy) you may notice spotting of blood in your stool or on the toilet paper. If you underwent a bowel prep for your procedure, then you may not have a normal bowel movement for a few days.  DIET: Your first meal following the procedure should be a light meal and then it is ok to progress to your normal diet.  A half-sandwich or bowl of soup is an example of a good first meal.  Heavy or fried foods are harder to digest and may make you feel nauseous or bloated.  Likewise meals heavy in dairy and vegetables can cause extra gas to form and this can also increase the bloating.  Drink plenty of fluids but you should avoid alcoholic beverages for 24 hours.  ACTIVITY: Your care partner should take you home directly after the procedure.  You should plan to take it easy, moving slowly for the rest of the day.  You can resume normal activity the day after the procedure however you should NOT DRIVE or use heavy machinery for 24 hours (because of the sedation medicines used during the test).    SYMPTOMS  TO REPORT IMMEDIATELY: A gastroenterologist can be reached at any hour.  During normal business hours, 8:30 AM to 5:00 PM Monday through Friday, call [phone].  After hours and on weekends, please call the GI answering service at [phone] who will take a message and have the physician on call contact you.   Following lower endoscopy (colonoscopy or flexible sigmoidoscopy):  Excessive amounts of blood in the stool  Significant tenderness or worsening of abdominal pains  Swelling of the abdomen that is new, acute  Fever of 100F or higher    FOLLOW UP: If any biopsies were taken you will be contacted by phone or by letter within the next 1-3 weeks.  Call your gastroenterologist if you have not heard about the biopsies in 3 weeks.  Our staff will call the home number listed on your records the next business day following your procedure to check on you and address any questions or concerns that you may have at that time regarding the information given to you following your procedure. This is a courtesy call and so if there is no answer at the home number and we have not heard from you through the emergency physician on call, we will assume that you have returned to your regular daily activities without incident.  SIGNATURES/CONFIDENTIALITY: You and/or your care partner have signed paperwork which will be entered into your electronic medical record.  These signatures attest to the fact that that the information above on your After Visit Summary has been reviewed and is understood.  Full responsibility of the confidentiality of this discharge information lies with you and/or your care-partner.

## 2013-02-02 NOTE — Op Note (Signed)
[facility] [address] [place], [address]   COLONOSCOPY PROCEDURE REPORT  PATIENT: [name], [name]  MR#: [id number] BIRTHDATE: 05-17-1946 , 67  yrs. old GENDER: Male ENDOSCOPIST: Louis Meckel, MD REFERRED BY: PROCEDURE DATE:  02/02/2013 PROCEDURE:   Colonoscopy with snare polypectomy ASA CLASS:   Class II INDICATIONS:Patient's immediate family history of colon cancer. MEDICATIONS: MAC sedation, administered by CRNA and Propofol (Diprivan) 270 mg IV  DESCRIPTION OF PROCEDURE:   After the risks benefits and alternatives of the procedure were thoroughly explained, informed consent was obtained.  A digital rectal exam revealed no abnormalities of the rectum.   The LB [id number]  endoscope was introduced through the anus and advanced to the cecum, which was identified by the ileocecal valve. No adverse events experienced.   .  moderate amount of retained, liquid stool   The quality of the prep was Suprep fair  The instrument was then slowly withdrawn as the colon was fully examined.      COLON FINDINGS: A sessile polyp was found at the cecum.  A polypectomy was performed using snare cautery.  The resection was complete and the polyp tissue was completely retrieved.   The colon mucosa was otherwise normal.  Retroflexed views revealed no abnormalities. The time to cecum=6 minutes 49 seconds.  Withdrawal time=9 minutes 01 seconds.  The scope was withdrawn and the procedure completed. COMPLICATIONS: There were no complications.  ENDOSCOPIC IMPRESSION: 1.   Sessile polyp was found at the cecum; polypectomy was performed using snare cautery 2.   The colon mucosa was otherwise normal  RECOMMENDATIONS: 1.  followup colonoscopy in 3 years in view of polyps size and limited visualization due to fair prep    eSigned:  Louis Meckel, MD 02/02/2013 10:23 AM   cc: Stacie Glaze, MD   PATIENT NAMEHusayn, [name] MR#: [id number]

## 2013-02-03 ENCOUNTER — Telehealth: Payer: Self-pay | Admitting: *Deleted

## 2013-02-03 NOTE — Telephone Encounter (Signed)
  Follow up Call-  Call back number 02/02/2013  Post procedure Call Back phone  # [phone]  Permission to leave phone message Yes     Patient questions:  Do you have a fever, pain , or abdominal swelling? no Pain Score  0 *  Have you tolerated food without any problems? yes  Have you been able to return to your normal activities? yes  Do you have any questions about your discharge instructions: Diet   no Medications  no Follow up visit  no  Do you have questions or concerns about your Care? no  Actions: * If pain score is 4 or above: No action needed, pain <4.

## 2013-02-05 ENCOUNTER — Other Ambulatory Visit: Payer: Self-pay | Admitting: Internal Medicine

## 2013-02-23 ENCOUNTER — Telehealth: Payer: Self-pay | Admitting: Gastroenterology

## 2013-02-23 ENCOUNTER — Encounter: Payer: Self-pay | Admitting: Gastroenterology

## 2013-02-23 NOTE — Telephone Encounter (Signed)
Pt states he is having pasty, dripping stool. He is also complaining of abdominal cramping. Pt scheduled to see Doug Sou PA tomorrow morning at 8:30am. Pt aware of appt date and time.

## 2013-02-24 ENCOUNTER — Ambulatory Visit (INDEPENDENT_AMBULATORY_CARE_PROVIDER_SITE_OTHER): Payer: 59 | Admitting: Gastroenterology

## 2013-02-24 ENCOUNTER — Encounter: Payer: Self-pay | Admitting: Gastroenterology

## 2013-02-24 VITALS — BP 136/60 | HR 60 | Ht 68.0 in | Wt 152.8 lb

## 2013-02-24 DIAGNOSIS — K649 Unspecified hemorrhoids: Secondary | ICD-10-CM

## 2013-02-24 DIAGNOSIS — K5900 Constipation, unspecified: Secondary | ICD-10-CM

## 2013-02-24 MED ORDER — HYDROCORTISONE ACE-PRAMOXINE 2.5-1 % RE CREA: TOPICAL_CREAM | Freq: Three times a day (TID) | RECTAL | Status: DC

## 2013-02-24 NOTE — Progress Notes (Signed)
I will refer the patient to GI research for a constipation/narcotic use trial

## 2013-02-24 NOTE — Progress Notes (Signed)
02/24/2013 [name] [id number] [birth year]   History of Present Illness: This is a pleasant 67 year old male who is a patient of Dr. Marzetta Board.  He has chronic constipation secondary to the pain medications that he takes for his back.  Says that since his recent colonoscopy on 6/17 that his constipation has been worse.  Colonoscopy revealed one polyp in the cecum, which was removed and was a TA.  He also had melanosis coli.  Says that in the past he was using fiber supplements and an herbal laxative to manage his constipation.  Since the colonoscopy he has not really started faithfully back with the fiber, however.  He says that he used a fleets enema and only had a small amount of pasty stool.  Drank two doses of Miralax yesterday with a small-medium sized BM this AM.  He also says that his hemorrhoids are enlarged and sore since all of this worsening constipation.  No rectal bleeding.  No abdominal pain.  Current Medications, Allergies, Past Medical History, Past Surgical History, Family History and Social History were reviewed in [employer] record.   Physical Exam: BP 136/60  Pulse 60  Ht 5\' 8"  (1.727 m)  Wt 152 lb 12.8 oz (69.31 kg)  BMI 23.24 kg/m2 General: Well developed, white male in no acute distress Head: Normocephalic and atraumatic Eyes:  sclerae anicteric, conjunctiva pink  Ears: Normal auditory acuity Lungs: Clear throughout to auscultation Heart: Regular rate and rhythm Abdomen: Soft, non-tender and non-distended. No masses, no hepatomegaly. Normal bowel sounds. Musculoskeletal: Symmetrical with no gross deformities  Extremities: No edema  Neurological: Alert oriented x 4, grossly nonfocal Psychological:  Alert and cooperative. Normal mood and affect  Assessment and Recommendations: -Constipation:  Chronic, but worse since colonoscopy.  Will drink a bottle of mag citrate tonight then start Miralax once or twice daily.  If this does not work for him  then consider trial of amitiza or linzess at follow-up visit. -Hemorrhoids:  Will give analpram cream to use TID for the next 1-2 weeks.  *Follow-up in 4-6 weeks.

## 2013-02-24 NOTE — Patient Instructions (Addendum)
We have sent the following medications to your pharmacy for you to pick up at your convenience: analpram  You can purchase a bottle of magnesium citrate over the counter and drink that this evening.  Start using mirilax 1-2 times daily  You have a follow up appointment with Dr. Arlyce Dice on 03/29/2013 @ 9:15am                                               We are excited to introduce MyChart, a new best-in-class service that provides you online access to important information in your electronic medical record. We want to make it easier for you to view your health information - all in one secure location - when and where you need it. We expect MyChart will enhance the quality of care and service we provide.  When you register for MyChart, you can:    View your test results.    Request appointments and receive appointment reminders via email.    Request medication renewals.    View your medical history, allergies, medications and immunizations.    Communicate with your physician's office through a password-protected site.    Conveniently print information such as your medication lists.  To find out if MyChart is right for you, please talk to a member of our clinical staff today. We will gladly answer your questions about this [facility] health and wellness tool.  If you are age 68 or older and want a member of your family to have access to your record, you must provide written consent by completing a proxy form available at our office. Please speak to our clinical staff about guidelines regarding accounts for patients younger than age 68.  As you activate your MyChart account and need any technical assistance, please call the MyChart technical support line at [phone]) or email your question to [email or web address]. If you email your question(s), please include your name, a return phone number and the best time to reach you.  If you have non-urgent health-related  questions, you can send a message to our office through MyChart at [email or web address]. If you have a medical emergency, call 911.  Thank you for using MyChart as your new health and wellness resource!   MyChart licensed from [employer],  [id number]. Patents Pending.

## 2013-03-24 ENCOUNTER — Other Ambulatory Visit: Payer: Self-pay

## 2013-03-29 ENCOUNTER — Encounter: Payer: Self-pay | Admitting: Internal Medicine

## 2013-03-29 ENCOUNTER — Other Ambulatory Visit: Payer: Self-pay | Admitting: *Deleted

## 2013-03-29 ENCOUNTER — Ambulatory Visit: Payer: 59 | Admitting: Gastroenterology

## 2013-03-29 DIAGNOSIS — R413 Other amnesia: Secondary | ICD-10-CM

## 2013-04-06 ENCOUNTER — Other Ambulatory Visit: Payer: Self-pay | Admitting: *Deleted

## 2013-04-06 DIAGNOSIS — I10 Essential (primary) hypertension: Secondary | ICD-10-CM

## 2013-04-06 MED ORDER — OLMESARTAN-AMLODIPINE-HCTZ 40-10-12.5 MG PO TABS: 1.0000 | ORAL_TABLET | Freq: Every day | ORAL | Status: DC

## 2013-04-22 ENCOUNTER — Encounter: Payer: Self-pay | Admitting: Neurology

## 2013-04-22 ENCOUNTER — Ambulatory Visit (INDEPENDENT_AMBULATORY_CARE_PROVIDER_SITE_OTHER): Payer: 59 | Admitting: Neurology

## 2013-04-22 VITALS — BP 164/68 | HR 57 | Ht 68.0 in | Wt 148.5 lb

## 2013-04-22 DIAGNOSIS — G250 Essential tremor: Secondary | ICD-10-CM

## 2013-04-22 DIAGNOSIS — R413 Other amnesia: Secondary | ICD-10-CM

## 2013-04-22 HISTORY — DX: Other amnesia: R41.3

## 2013-04-22 NOTE — Progress Notes (Signed)
Reason for visit: Memory disturbance  [name] is a 67 y.o. male  History of present illness:  [name] is a 67 year old right-handed white male with a history of chronic low back pain. The patient has been on pain medications for long period time. The patient also has a testosterone deficiency, and he has been unsuccessful in getting his levels into the therapeutic range. The patient indicates that within the last 6 months, he has had difficulty balancing his checkbook, which has never been an issue for him. The patient has become more forgetful, but he has not given up any of his activities of daily living secondary to memory. The patient has difficulty organizing himself, and cognitive tasks are taking longer to do. The patient has also had a several year history of tremors affecting both upper extremities, right equal to left. The tremors affect his handwriting, and his ability to feed himself. The patient has not noticed any resting tremors. The patient has had some decrease in amplitude of his voice, and he has noted some difficulty with his balance. The patient has been shuffling his feet slightly, and he occasionally will stumble. The patient denies any falls. The patient has noted the balance changes over the last 3 or 4 months. The patient denies any numbness or weakness of the extremities, but he does have some sensation of dizziness. The patient denies any swallowing or choking problems. The patient denies neck pain, but he does have low back pain. The patient has noted some problems with increased urinary frequency, and he has had occasional episodes of nocturnal incontinence. The patient is sent to this office for an evaluation. The patient has had a vitamin B12 level approximately one year ago that was adequate, and a recent thyroid study was normal. The patient carries the diagnosis of obstructive sleep apnea, but he has not been able to tolerate CPAP.  Past Medical History   Diagnosis Date  . Nocturia   . Essential and other specified forms of tremor   . Hypersomnia with sleep apnea, unspecified   . Abdominal pain, right lower quadrant   . Acute prostatitis   . Spinal stenosis, lumbar region, without neurogenic claudication   . Fibromyalgia   . Urinary frequency   . Other testicular hypofunction   . Syncope and collapse   . Hypopotassemia   . Hemarthrosis, upper arm   . Unspecified adverse effect of unspecified drug, medicinal and biological substance   . Unspecified gastritis and gastroduodenitis without mention of hemorrhage   . Diverticulosis of colon (without mention of hemorrhage)   . Family history of malignant neoplasm of gastrointestinal tract   . Osteoporosis, unspecified   . Lumbago   . Ganglion and cyst of synovium, tendon, and bursa   . Hypertension   . GERD (gastroesophageal reflux disease)   . Hyperlipidemia   . Constipation   . Sleep apnea     last sleep study 11/11 on chart- Bipap with settings of 4 per  patient  . Localized osteoarthrosis not specified whether primary or secondary, lower leg   . Degenerative disc disease   . Anxiety   . Depression   . Right inguinal hernia   . Memory difficulties 04/22/2013    Past Surgical History  Procedure Laterality Date  . Back surgery      x 3  . Microdisectomy  03/02/2003, 06/29/2003, 12/24/2005  . Hernia repair  1965, 1968  . Tonsillectomy    . Inguinal hernia repair  07/19/2011  Procedure: LAPAROSCOPIC INGUINAL HERNIA;  Surgeon: Adolph Pollack, MD;  Location: [facility];  Service: General;  Laterality: Right;  Laparoscopic Repair of Recurrent Right  Ingunial Hernia with Mesh    Family History  Problem Relation Age of Onset  . Colon cancer Mother   . Dementia Mother   . Parkinsonism Father 42  . Liver disease Neg Hx   . Kidney disease Neg Hx   . Esophageal cancer Neg Hx   . Lung cancer Paternal Grandfather     smoker    Social history:  reports that he has never smoked. He  has never used smokeless tobacco. He reports that he drinks about 1.8 ounces of alcohol per week. He reports that he does not use illicit drugs.  Medications:  Current Outpatient Prescriptions on File Prior to Visit  Medication Sig Dispense Refill  . Calcium Carbonate-Vitamin D (CALCIUM 600 + D PO) Take 1 tablet by mouth 2 (two) times daily.       . cholecalciferol (VITAMIN D) 1000 UNITS tablet Take 5,000 Units by mouth daily.       . citalopram (CELEXA) 40 MG tablet Take 1 tablet (40 mg total) by mouth every morning.  90 tablet  3  . Coenzyme Q10 200 MG capsule Take 200 mg by mouth daily.       Marland Kitchen FORTEO 600 MCG/2.4ML SOLN INJECT SUBCUTANEOUSLY ONCE DAILY. KEEP REFRIGERATED AND [id number] DAYS AFTER INITIAL USE. DO NOT FREEZE.  2.4 mL  10  . MAGNESIUM CITRATE PO Take 200 mg by mouth 2 (two) times daily.       . methadone (DOLOPHINE) 5 MG tablet Take 5 mg by mouth 2 (two) times daily.      . Nutritional Supplements (ANTI-OXIDANT COMPLEX PO) Take 10 mg by mouth daily.       . Olmesartan-Amlodipine-HCTZ 40-10-12.5 MG TABS Take 1 tablet by mouth daily.  90 tablet  3  . OVER THE COUNTER MEDICATION Take 1 capsule by mouth 2 (two) times daily. Fish oil 2126 mg, epa 800 mg, dha 400 mg,      . OVER THE COUNTER MEDICATION Take 1 scoop by mouth 2 (two) times daily. Raw fiber      . OVER THE COUNTER MEDICATION Place 1 application into both eyes 2 (two) times daily as needed. Dry eyes.  Refresh liquid gel      . potassium chloride (K-DUR) 10 MEQ tablet Take 1 tablet (10 mEq total) by mouth daily. 1 twice a day for 3 days and then 1 every dat  90 tablet  3  . Probiotic Product (ALIGN) 4 MG CAPS Take by mouth daily.       . psyllium (REGULOID) 0.52 G capsule Take 0.52 g by mouth 2 (two) times daily.       . Red Yeast Rice 600 MG CAPS Take by mouth 2 (two) times daily.       . vitamin B-12 (CYANOCOBALAMIN) 50 MCG tablet Take 50 mcg by mouth daily.        No current facility-administered medications on  file prior to visit.      Allergies  Allergen Reactions  . Nucynta [Tapentadol] Other (See Comments)    dizziness  . Pregabalin     REACTION: dizziness and mental status changes    ROS:  Out of a complete 14 system review of symptoms, the patient complains only of the following symptoms, and all other reviewed systems are negative.  Fatigue Snoring Constipation Urination problems, frequency  Easy bruising, easy bleeding Feeling hot, cold Joint pain, joint swelling Memory loss, confusion, weakness, dizziness, tremor Depression, anxiety, hypersomnolence, decreased energy Snoring  Blood pressure 164/68, pulse 57, height 5\' 8"  (1.727 m), weight 148 lb 8 oz (67.359 kg).  Physical Exam  General: The patient is alert and cooperative at the time of the examination.  Head: Pupils are equal, round, and reactive to light. Discs are flat bilaterally.  Neck: The neck is supple, no carotid bruits are noted.  Respiratory: The respiratory examination is clear.  Cardiovascular: The cardiovascular examination reveals a regular rate and rhythm, no obvious murmurs or rubs are noted.  Skin: Extremities are without significant edema.  Neurologic Exam  Mental status: Mini-Mental status examination done today shows a total score of 24/30.  Cranial nerves: Facial symmetry is present. There is good sensation of the face to pinprick and soft touch bilaterally. The strength of the facial muscles and the muscles to head turning and shoulder shrug are normal bilaterally. Speech is well enunciated, no aphasia or dysarthria is noted. Extraocular movements are full. Visual fields are full.  Motor: The motor testing reveals 5 over 5 strength of all 4 extremities. Good symmetric motor tone is noted throughout.  Sensory: Sensory testing is intact to pinprick, soft touch, vibration sensation, and position sense on all 4 extremities. No evidence of extinction is noted.  Coordination: Cerebellar testing  reveals good finger-nose-finger and heel-to-shin bilaterally. The patient has a mild intention tremor with finger-nose-finger, no resting tremor is noted. Tremor is symmetric from one side to the next.  Gait and station: Gait is normal. The patient is able to arise from a seated position with the arms crossed. The patient has good arm swing with ambulation. Tandem gait is normal. Romberg is negative. No drift is seen.  Reflexes: Deep tendon reflexes are symmetric and normal bilaterally. Toes are downgoing bilaterally.   Assessment/Plan:  One. Memory disturbance  2. Mild gait disturbance  3. Benign essential tremor  4. Sleep apnea, not on CPAP  5. Testosterone deficiency  6. Chronic low back pain  The patient has noted some slight alteration in balance, bladder control, and memory within the last several months. The patient will be set up for MRI evaluation of the brain to exclude small vessel ischemic changes or normal pressure hydrocephalus. The patient will be considered for medication for memory if this study is unremarkable. The patient will followup in about 6 months.  Marlan Palau MD 04/22/2013 7:13 PM  [facility] [address] [place], [place] [address]  Phone [phone] Fax [phone]

## 2013-04-29 ENCOUNTER — Ambulatory Visit (INDEPENDENT_AMBULATORY_CARE_PROVIDER_SITE_OTHER): Payer: 59

## 2013-04-29 DIAGNOSIS — G250 Essential tremor: Secondary | ICD-10-CM

## 2013-04-29 DIAGNOSIS — R413 Other amnesia: Secondary | ICD-10-CM

## 2013-04-30 ENCOUNTER — Telehealth: Payer: Self-pay | Admitting: Neurology

## 2013-04-30 MED ORDER — DONEPEZIL HCL 5 MG PO TABS: 5.0000 mg | ORAL_TABLET | Freq: Every day | ORAL | Status: DC

## 2013-04-30 NOTE — Telephone Encounter (Signed)
I called patient. MRI the brain is unremarkable with exception of atrophy. The patient has no small vessel disease. The patient will go on Aricept for memory. The patient is concerned about his balance, but this may be part of the benign essential tremor syndrome. Blood work including B12 levels, thyroid or unremarkable.

## 2013-05-04 ENCOUNTER — Other Ambulatory Visit: Payer: Self-pay | Admitting: Internal Medicine

## 2013-05-07 ENCOUNTER — Encounter: Payer: Self-pay | Admitting: Internal Medicine

## 2013-05-07 ENCOUNTER — Ambulatory Visit (INDEPENDENT_AMBULATORY_CARE_PROVIDER_SITE_OTHER): Payer: 59 | Admitting: Internal Medicine

## 2013-05-07 VITALS — BP 140/80 | HR 80 | Temp 98.2°F | Resp 16 | Ht 68.0 in | Wt 149.0 lb

## 2013-05-07 DIAGNOSIS — IMO0001 Reserved for inherently not codable concepts without codable children: Secondary | ICD-10-CM

## 2013-05-07 DIAGNOSIS — I10 Essential (primary) hypertension: Secondary | ICD-10-CM

## 2013-05-07 DIAGNOSIS — R413 Other amnesia: Secondary | ICD-10-CM

## 2013-05-07 DIAGNOSIS — Z23 Encounter for immunization: Secondary | ICD-10-CM

## 2013-05-07 DIAGNOSIS — E291 Testicular hypofunction: Secondary | ICD-10-CM

## 2013-05-07 DIAGNOSIS — E785 Hyperlipidemia, unspecified: Secondary | ICD-10-CM

## 2013-05-07 DIAGNOSIS — G250 Essential tremor: Secondary | ICD-10-CM

## 2013-05-07 DIAGNOSIS — M48061 Spinal stenosis, lumbar region without neurogenic claudication: Secondary | ICD-10-CM

## 2013-05-07 MED ORDER — TESTOSTERONE CYPIONATE 200 MG/ML IM SOLN: 50.0000 mg | INTRAMUSCULAR | Status: DC

## 2013-05-07 MED ORDER — PHOSPHATIDYLSERINE-DHA-EPA 100-19.5-6.5 MG PO CAPS: 1.0000 | ORAL_CAPSULE | Freq: Two times a day (BID) | ORAL | Status: DC

## 2013-05-07 NOTE — Progress Notes (Signed)
Subjective:    Patient ID: [name], male    DOB: Apr 13, 1946, 67 y.o.   MRN: [id number]  HPI  Had seen neurology for memory issues and loss of balance Increased dizziness persistent severe pain Feels like he has been loosing ground ?testosterone therapy due to  Low levels Dr Anne Hahn suggested donepezil but did not start Depression? On cilalopram Stopped pain medications' On forteo for bone loss    Review of Systems  Constitutional: Positive for fatigue.  Eyes: Negative.   Respiratory: Negative.   Cardiovascular: Negative.   Neurological: Positive for tremors.  Psychiatric/Behavioral: Positive for confusion, dysphoric mood and decreased concentration.   Past Medical History  Diagnosis Date  . Nocturia   . Essential and other specified forms of tremor   . Hypersomnia with sleep apnea, unspecified   . Abdominal pain, right lower quadrant   . Acute prostatitis   . Spinal stenosis, lumbar region, without neurogenic claudication   . Fibromyalgia   . Urinary frequency   . Other testicular hypofunction   . Syncope and collapse   . Hypopotassemia   . Hemarthrosis, upper arm   . Unspecified adverse effect of unspecified drug, medicinal and biological substance   . Unspecified gastritis and gastroduodenitis without mention of hemorrhage   . Diverticulosis of colon (without mention of hemorrhage)   . Family history of malignant neoplasm of gastrointestinal tract   . Osteoporosis, unspecified   . Lumbago   . Ganglion and cyst of synovium, tendon, and bursa   . Hypertension   . GERD (gastroesophageal reflux disease)   . Hyperlipidemia   . Constipation   . Sleep apnea     last sleep study 11/11 on chart- Bipap with settings of 4 per  patient  . Localized osteoarthrosis not specified whether primary or secondary, lower leg   . Degenerative disc disease   . Anxiety   . Depression   . Right inguinal hernia   . Memory difficulties 04/22/2013    History   Social History   . Marital Status: Married    Spouse Name: N/A    Number of Children: 2  . Years of Education: college   Occupational History  . retired    Social History Main Topics  . Smoking status: Never Smoker   . Smokeless tobacco: Never Used  . Alcohol Use: 1.8 oz/week    3 Glasses of wine per week     Comment: less than 5 per week  . Drug Use: No  . Sexual Activity: Yes   Other Topics Concern  . Not on file   Social History Narrative  . No narrative on file    Past Surgical History  Procedure Laterality Date  . Back surgery      x 3  . Microdisectomy  03/02/2003, 06/29/2003, 12/24/2005  . Hernia repair  1965, 1968  . Tonsillectomy    . Inguinal hernia repair  07/19/2011    Procedure: LAPAROSCOPIC INGUINAL HERNIA;  Surgeon: Adolph Pollack, MD;  Location: [facility];  Service: General;  Laterality: Right;  Laparoscopic Repair of Recurrent Right  Ingunial Hernia with Mesh    Family History  Problem Relation Age of Onset  . Colon cancer Mother   . Dementia Mother   . Parkinsonism Father 9  . Liver disease Neg Hx   . Kidney disease Neg Hx   . Esophageal cancer Neg Hx   . Lung cancer Paternal Grandfather     smoker    Allergies  Allergen  Reactions  . Nucynta [Tapentadol] Other (See Comments)    dizziness  . Pregabalin     REACTION: dizziness and mental status changes    Current Outpatient Prescriptions on File Prior to Visit  Medication Sig Dispense Refill  . Calcium Carbonate-Vitamin D (CALCIUM 600 + D PO) Take 1 tablet by mouth 2 (two) times daily.       . cholecalciferol (VITAMIN D) 1000 UNITS tablet Take 5,000 Units by mouth daily.       . citalopram (CELEXA) 40 MG tablet TAKE 1 TABLET EVERY MORNING  90 tablet  3  . Coenzyme Q10 200 MG capsule Take 200 mg by mouth daily.       Marland Kitchen FORTEO 600 MCG/2.4ML SOLN INJECT SUBCUTANEOUSLY ONCE DAILY. KEEP REFRIGERATED AND [id number] DAYS AFTER INITIAL USE. DO NOT FREEZE.  2.4 mL  10  . MAGNESIUM CITRATE PO Take 200 mg by  mouth 2 (two) times daily.       . Nutritional Supplements (ANTI-OXIDANT COMPLEX PO) Take 10 mg by mouth daily.       . Olmesartan-Amlodipine-HCTZ 40-10-12.5 MG TABS Take 1 tablet by mouth daily.  90 tablet  3  . OVER THE COUNTER MEDICATION Take 1 scoop by mouth 2 (two) times daily. Raw fiber      . OVER THE COUNTER MEDICATION Place 1 application into both eyes 2 (two) times daily as needed. Dry eyes.  Refresh liquid gel      . potassium chloride (K-DUR) 10 MEQ tablet Take 1 tablet (10 mEq total) by mouth daily. 1 twice a day for 3 days and then 1 every dat  90 tablet  3  . Probiotic Product (ALIGN) 4 MG CAPS Take by mouth daily.       . psyllium (REGULOID) 0.52 G capsule Take 0.52 g by mouth 2 (two) times daily.       . Red Yeast Rice 600 MG CAPS Take by mouth 2 (two) times daily.       . vitamin B-12 (CYANOCOBALAMIN) 50 MCG tablet Take 50 mcg by mouth daily.        No current facility-administered medications on file prior to visit.    BP 140/80  Pulse 80  Temp(Src) 98.2 F (36.8 C)  Resp 16  Ht 5\' 8"  (1.727 m)  Wt 149 lb (67.586 kg)  BMI 22.66 kg/m2       Objective:   Physical Exam  Constitutional: He appears well-developed and well-nourished.  HENT:  Head: Normocephalic and atraumatic.  Eyes: Conjunctivae are normal. Pupils are equal, round, and reactive to light.  Neck: Normal range of motion. Neck supple.  Cardiovascular: Normal rate and regular rhythm.   Pulmonary/Chest: Effort normal and breath sounds normal.  Abdominal: Soft. Bowel sounds are normal.  Musculoskeletal: He exhibits tenderness.          Assessment & Plan:  Memory issues trial of Varacog Testosterone injections suppliments Refer for massage and or accupressure

## 2013-05-07 NOTE — Patient Instructions (Signed)
Take b6, b12 and folic acid and vit e  b6 50 b12 500 Folic acid 1 Vit e is 400 BID

## 2013-05-11 ENCOUNTER — Other Ambulatory Visit: Payer: Self-pay | Admitting: *Deleted

## 2013-05-11 DIAGNOSIS — I10 Essential (primary) hypertension: Secondary | ICD-10-CM

## 2013-05-11 MED ORDER — OLMESARTAN-AMLODIPINE-HCTZ 40-10-12.5 MG PO TABS: 1.0000 | ORAL_TABLET | Freq: Every day | ORAL | Status: DC

## 2013-05-12 ENCOUNTER — Ambulatory Visit: Payer: 59 | Admitting: Internal Medicine

## 2013-05-21 ENCOUNTER — Encounter: Payer: Self-pay | Admitting: Internal Medicine

## 2013-05-25 ENCOUNTER — Encounter: Payer: Self-pay | Admitting: Family Medicine

## 2013-07-12 ENCOUNTER — Other Ambulatory Visit: Payer: Self-pay | Admitting: Internal Medicine

## 2013-09-10 ENCOUNTER — Ambulatory Visit (INDEPENDENT_AMBULATORY_CARE_PROVIDER_SITE_OTHER): Payer: 59 | Admitting: Internal Medicine

## 2013-09-10 ENCOUNTER — Encounter: Payer: Self-pay | Admitting: Internal Medicine

## 2013-09-10 VITALS — BP 144/80 | HR 76 | Temp 98.6°F | Resp 16 | Ht 68.0 in | Wt 152.0 lb

## 2013-09-10 DIAGNOSIS — I10 Essential (primary) hypertension: Secondary | ICD-10-CM

## 2013-09-10 DIAGNOSIS — M48061 Spinal stenosis, lumbar region without neurogenic claudication: Secondary | ICD-10-CM

## 2013-09-10 DIAGNOSIS — E291 Testicular hypofunction: Secondary | ICD-10-CM

## 2013-09-10 NOTE — Progress Notes (Signed)
Subjective:    Patient ID: [name], male    DOB: 1945/10/25, 68 y.o.   MRN: [id number]  HPI  With chronic spinal stenosis presents with increased pain in back and hips in his talk to his pain specialist is recommended adding atraumatic and a gluten-Whitsel diet which we have discussed previously.  He has continued groin pain at the site of his hernia repair he has increased frequency of urination on testosterone suggesting development of BPH persistent dull back pain more prominent in the right side and he has questions about shingles vaccination  Review of Systems  Constitutional: Positive for activity change and fatigue.  HENT: Negative.   Eyes: Negative.   Respiratory: Negative.   Cardiovascular: Negative.   Endocrine: Negative.   Genitourinary: Positive for frequency and flank pain.  Musculoskeletal: Positive for gait problem and myalgias.   Past Medical History  Diagnosis Date  . Nocturia   . Essential and other specified forms of tremor   . Hypersomnia with sleep apnea, unspecified   . Abdominal pain, right lower quadrant   . Acute prostatitis   . Spinal stenosis, lumbar region, without neurogenic claudication   . Fibromyalgia   . Urinary frequency   . Other testicular hypofunction   . Syncope and collapse   . Hypopotassemia   . Hemarthrosis, upper arm   . Unspecified adverse effect of unspecified drug, medicinal and biological substance   . Unspecified gastritis and gastroduodenitis without mention of hemorrhage   . Diverticulosis of colon (without mention of hemorrhage)   . Family history of malignant neoplasm of gastrointestinal tract   . Osteoporosis, unspecified   . Lumbago   . Ganglion and cyst of synovium, tendon, and bursa   . Hypertension   . GERD (gastroesophageal reflux disease)   . Hyperlipidemia   . Constipation   . Sleep apnea     last sleep study 11/11 on chart- Bipap with settings of 4 per  patient  . Localized osteoarthrosis not specified  whether primary or secondary, lower leg   . Degenerative disc disease   . Anxiety   . Depression   . Right inguinal hernia   . Memory difficulties 04/22/2013    History   Social History  . Marital Status: Married    Spouse Name: N/A    Number of Children: 2  . Years of Education: college   Occupational History  . retired    Social History Main Topics  . Smoking status: Never Smoker   . Smokeless tobacco: Never Used  . Alcohol Use: 1.8 oz/week    3 Glasses of wine per week     Comment: less than 5 per week  . Drug Use: No  . Sexual Activity: Yes   Other Topics Concern  . Not on file   Social History Narrative  . No narrative on file    Past Surgical History  Procedure Laterality Date  . Back surgery      x 3  . Microdisectomy  03/02/2003, 06/29/2003, 12/24/2005  . Hernia repair  1965, 1968  . Tonsillectomy    . Inguinal hernia repair  07/19/2011    Procedure: LAPAROSCOPIC INGUINAL HERNIA;  Surgeon: Odis Hollingshead, MD;  Location: [facility];  Service: General;  Laterality: Right;  Laparoscopic Repair of Recurrent Right  Ingunial Hernia with Mesh    Family History  Problem Relation Age of Onset  . Colon cancer Mother   . Dementia Mother   . Parkinsonism Father 60  .  Liver disease Neg Hx   . Kidney disease Neg Hx   . Esophageal cancer Neg Hx   . Lung cancer Paternal Grandfather     smoker    Allergies  Allergen Reactions  . Nucynta [Tapentadol] Other (See Comments)    dizziness  . Pregabalin     REACTION: dizziness and mental status changes    Current Outpatient Prescriptions on File Prior to Visit  Medication Sig Dispense Refill  . Calcium Carbonate-Vitamin D (CALCIUM 600 + D PO) Take 1 tablet by mouth 2 (two) times daily.       . cholecalciferol (VITAMIN D) 1000 UNITS tablet Take 5,000 Units by mouth daily.       . citalopram (CELEXA) 40 MG tablet TAKE 1 TABLET EVERY MORNING  90 tablet  3  . Coenzyme Q10 200 MG capsule Take 200 mg by mouth daily.         Marland Kitchen FORTEO 600 MCG/2.4ML SOLN INJECT SUBCUTANEOUSLY ONCE DAILY. KEEP REFRIGERATED AND [id number] DAYS AFTER INITIAL USE. DO NOT FREEZE.  2.4 mL  10  . MAGNESIUM CITRATE PO Take 200 mg by mouth 2 (two) times daily.       . Nutritional Supplements (ANTI-OXIDANT COMPLEX PO) Take 10 mg by mouth daily.       . Olmesartan-Amlodipine-HCTZ 40-10-12.5 MG TABS Take 1 tablet by mouth daily.  90 tablet  3  . OVER THE COUNTER MEDICATION Take 1 scoop by mouth 2 (two) times daily. Raw fiber      . OVER THE COUNTER MEDICATION Place 1 application into both eyes 2 (two) times daily as needed. Dry eyes.  Refresh liquid gel      . Phosphatidylserine-DHA-EPA 100-19.5-6.5 MG CAPS Take 1 capsule by mouth 2 (two) times daily before a meal.  60 capsule  11  . potassium chloride (KLOR-CON M10) 10 MEQ tablet Take 1 daily  90 tablet  3  . Probiotic Product (ALIGN) 4 MG CAPS Take by mouth daily.       . psyllium (REGULOID) 0.52 G capsule Take 0.52 g by mouth 2 (two) times daily.       . Red Yeast Rice 600 MG CAPS Take by mouth 2 (two) times daily.       Marland Kitchen testosterone cypionate (DEPOTESTOTERONE CYPIONATE) 200 MG/ML injection Inject 0.25 mLs (50 mg total) into the muscle every 7 (seven) days.  10 mL  3  . vitamin B-12 (CYANOCOBALAMIN) 50 MCG tablet Take 50 mcg by mouth daily.       . [DISCONTINUED] potassium chloride (K-DUR) 10 MEQ tablet Take 1 tablet (10 mEq total) by mouth daily. 1 twice a day for 3 days and then 1 every dat  90 tablet  3   No current facility-administered medications on file prior to visit.    BP 144/80  Pulse 76  Temp(Src) 98.6 F (37 C)  Resp 16  Ht  (1.727 m)  Wt 152 lb (68.947 kg)  BMI 23.12 kg/m2       Objective:   Physical Exam  Nursing note and vitals reviewed. Constitutional: He appears well-developed and well-nourished.  HENT:  Head: Normocephalic and atraumatic.  Eyes: Conjunctivae are normal. Pupils are equal, round, and reactive to light.  Neck: Normal range of  motion. Neck supple.  Cardiovascular: Normal rate and regular rhythm.   Pulmonary/Chest: Effort normal and breath sounds normal.  Pain in the posterior aspect of the floating ribs on the right  Abdominal: Soft. Bowel sounds are normal.  Assessment & Plan:  Agree with the assessment and plan by the pain specialist.  Discontinuation of testosterone is recommended due to BPH  Shingles shot recommended Monitor frequency of urination off the testosterone

## 2013-09-10 NOTE — Progress Notes (Signed)
Pre visit review using our clinic review tool, if applicable. No additional management support is needed unless otherwise documented below in the visit note. 

## 2013-09-10 NOTE — Progress Notes (Signed)
   Subjective:    Patient ID: [name], male    DOB: 1946/01/01, 68 y.o.   MRN: [id number]  HPI The back pain has been increasing due to progressive spinal stenosis discused spinal stenosis   Review of Systems     Objective:   Physical Exam        Assessment & Plan:

## 2013-09-10 NOTE — Patient Instructions (Signed)
The patient is instructed to continue all medications as prescribed. Schedule followup with check out clerk upon leaving the clinic  

## 2013-09-22 ENCOUNTER — Telehealth: Payer: Self-pay | Admitting: Internal Medicine

## 2013-09-22 NOTE — Telephone Encounter (Signed)
Relevant patient education mailed to patient.  

## 2013-09-24 ENCOUNTER — Ambulatory Visit (INDEPENDENT_AMBULATORY_CARE_PROVIDER_SITE_OTHER): Payer: 59 | Admitting: Internal Medicine

## 2013-09-24 DIAGNOSIS — Z23 Encounter for immunization: Secondary | ICD-10-CM

## 2013-09-24 DIAGNOSIS — Z2911 Encounter for prophylactic immunotherapy for respiratory syncytial virus (RSV): Secondary | ICD-10-CM

## 2013-10-21 ENCOUNTER — Ambulatory Visit (INDEPENDENT_AMBULATORY_CARE_PROVIDER_SITE_OTHER): Payer: 59 | Admitting: Neurology

## 2013-10-21 ENCOUNTER — Encounter: Payer: Self-pay | Admitting: Neurology

## 2013-10-21 VITALS — BP 165/80 | HR 71 | Wt 151.0 lb

## 2013-10-21 DIAGNOSIS — R413 Other amnesia: Secondary | ICD-10-CM

## 2013-10-21 MED ORDER — RIVASTIGMINE 4.6 MG/24HR TD PT24: 4.6000 mg | MEDICATED_PATCH | Freq: Every day | TRANSDERMAL | Status: DC

## 2013-10-21 NOTE — Progress Notes (Signed)
Reason for visit: Memory disturbance  [name] is an 68 y.o. male  History of present illness:  [name] is a 68 year old right-handed white male with a history of a memory disturbance that dates back approximately one year. The patient had a lot of issues with chronic low back pain, and he is followed by Dr. Nicholaus Bloom for this. Today, none of the measures initiated have been totally effective in treating the back pain. The patient has undergone MRI evaluation of the brain that shows evidence of generalized atrophy, no significant small vessel disease has been noted. The patient returns to the office today for an evaluation. The patient has not noted any significant change in memory since last seen. The patient is operating a motor vehicle without much difficulty. The patient does have difficulty remembering recent events. The patient was given a prescription for Aricept, but he never started the medication because he was concerned that this would adversely affect his stomach.  Past Medical History  Diagnosis Date  . Nocturia   . Essential and other specified forms of tremor   . Hypersomnia with sleep apnea, unspecified   . Abdominal pain, right lower quadrant   . Acute prostatitis   . Spinal stenosis, lumbar region, without neurogenic claudication   . Fibromyalgia   . Urinary frequency   . Other testicular hypofunction   . Syncope and collapse   . Hypopotassemia   . Hemarthrosis, upper arm   . Unspecified adverse effect of unspecified drug, medicinal and biological substance   . Unspecified gastritis and gastroduodenitis without mention of hemorrhage   . Diverticulosis of colon (without mention of hemorrhage)   . Family history of malignant neoplasm of gastrointestinal tract   . Osteoporosis, unspecified   . Lumbago   . Ganglion and cyst of synovium, tendon, and bursa   . Hypertension   . GERD (gastroesophageal reflux disease)   . Hyperlipidemia   . Constipation   .  Sleep apnea     last sleep study 11/11 on chart- Bipap with settings of 4 per  patient  . Localized osteoarthrosis not specified whether primary or secondary, lower leg   . Degenerative disc disease   . Anxiety   . Depression   . Right inguinal hernia   . Memory difficulties 04/22/2013    Past Surgical History  Procedure Laterality Date  . Back surgery      x 3  . Microdisectomy  03/02/2003, 06/29/2003, 12/24/2005  . Hernia repair  1965, 1968  . Tonsillectomy    . Inguinal hernia repair  07/19/2011    Procedure: LAPAROSCOPIC INGUINAL HERNIA;  Surgeon: Odis Hollingshead, MD;  Location: [facility];  Service: General;  Laterality: Right;  Laparoscopic Repair of Recurrent Right  Ingunial Hernia with Mesh    Family History  Problem Relation Age of Onset  . Colon cancer Mother   . Dementia Mother   . Parkinsonism Father 69  . Liver disease Neg Hx   . Kidney disease Neg Hx   . Esophageal cancer Neg Hx   . Lung cancer Paternal Grandfather     smoker    Social history:  reports that he has never smoked. He has never used smokeless tobacco. He reports that he drinks about 1.8 ounces of alcohol per week. He reports that he does not use illicit drugs.    Allergies  Allergen Reactions  . Nucynta [Tapentadol] Other (See Comments)    dizziness  . Pregabalin  REACTION: dizziness and mental status changes    Medications:  Current Outpatient Prescriptions on File Prior to Visit  Medication Sig Dispense Refill  . Calcium Carbonate-Vitamin D (CALCIUM 600 + D PO) Take 1 tablet by mouth 2 (two) times daily.       . cholecalciferol (VITAMIN D) 1000 UNITS tablet Take 5,000 Units by mouth daily.       . citalopram (CELEXA) 40 MG tablet TAKE 1 TABLET EVERY MORNING  90 tablet  3  . Coenzyme Q10 200 MG capsule Take 200 mg by mouth daily.       . folic acid (FOLVITE) 778 MCG tablet Take 400 mcg by mouth daily.      Marland Kitchen FORTEO 600 MCG/2.4ML SOLN INJECT 20MCG SUBCUTANEOUSLY ONCE DAILY. KEEP REFRIGERATED  AND [id number] DAYS AFTER INITIAL USE. DO NOT FREEZE.  2.4 mL  10  . MAGNESIUM CITRATE PO Take 200 mg by mouth 2 (two) times daily.       . Nutritional Supplements (ANTI-OXIDANT COMPLEX PO) Take 10 mg by mouth daily.       . Olmesartan-Amlodipine-HCTZ 40-10-12.5 MG TABS Take 1 tablet by mouth daily.  90 tablet  3  . OVER THE COUNTER MEDICATION Take 1 scoop by mouth 2 (two) times daily. Raw fiber      . OVER THE COUNTER MEDICATION Place 1 application into both eyes 2 (two) times daily as needed. Dry eyes.  Refresh liquid gel      . Phosphatidylserine-DHA-EPA 100-19.5-6.5 MG CAPS Take 1 capsule by mouth 2 (two) times daily before a meal.  60 capsule  11  . potassium chloride (KLOR-CON M10) 10 MEQ tablet Take 1 daily  90 tablet  3  . Probiotic Product (ALIGN) 4 MG CAPS Take by mouth daily.       . psyllium (REGULOID) 0.52 G capsule Take 0.52 g by mouth 2 (two) times daily.       Marland Kitchen pyridoxine (B-6) 100 MG tablet Take 100 mg by mouth daily.      . Red Yeast Rice 600 MG CAPS Take by mouth 2 (two) times daily.       Marland Kitchen testosterone cypionate (DEPOTESTOTERONE CYPIONATE) 200 MG/ML injection Inject 0.25 mLs (50 mg total) into the muscle every 7 (seven) days.  10 mL  3  . UNABLE TO FIND Tumeric 1 qd      . vitamin B-12 (CYANOCOBALAMIN) 50 MCG tablet Take 50 mcg by mouth daily.       . vitamin E 400 UNIT capsule Take 400 Units by mouth daily.      . [DISCONTINUED] potassium chloride (K-DUR) 10 MEQ tablet Take 1 tablet (10 mEq total) by mouth daily. 1 twice a day for 3 days and then 1 every dat  90 tablet  3   No current facility-administered medications on file prior to visit.    ROS:  Out of a complete 14 system review of symptoms, the patient complains only of the following symptoms, and all other reviewed systems are negative.  Fatigue Abdominal pain, constipation Apnea, snoring Frequency of urination, urgency Joint pain, back pain Bruising easily Memory loss, dizziness, weakness,  tremors  Blood pressure 165/80, pulse 71, weight 151 lb (68.493 kg).  Physical Exam  General: The patient is alert and cooperative at the time of the examination.  Skin: No significant peripheral edema is noted.   Neurologic Exam  Mental status: The Mini-Mental status examination done today shows a total score 29/30.  Cranial nerves: Facial symmetry is present.  Speech is normal, no aphasia or dysarthria is noted. Extraocular movements are full. Visual fields are full.  Motor: The patient has good strength in all 4 extremities.  Sensory examination: Soft touch sensation is symmetric on the face, arms, and legs.  Coordination: The patient has good finger-nose-finger and heel-to-shin bilaterally.  Gait and station: The patient has a normal gait. Tandem gait is normal. Romberg is negative. No drift is seen.  Reflexes: Deep tendon reflexes are symmetric.   MRI brain 04/29/2013:  IMPRESSION: Abnormal MRI scan of brain showing mild generalized cortical atrophy.    Assessment/Plan:  1. Memory disturbance  The patient will be given a trial on low-dose Exelon to see if some of the stomach side effects can be avoided. Samples were given, and a small prescription was given. The patient will contact me if he believes he can tolerate the medication and a larger prescription will be called in. The patient otherwise will followup in 6-8 months. In the future, other treatment such as Axona or Talbert Nan can be considered.  Jill Alexanders MD 10/21/2013 1:49 PM  [facility] [address] [place], [place] [address]  Phone [phone] Fax [phone]

## 2013-11-01 ENCOUNTER — Ambulatory Visit (INDEPENDENT_AMBULATORY_CARE_PROVIDER_SITE_OTHER)
Admission: RE | Admit: 2013-11-01 | Discharge: 2013-11-01 | Disposition: A | Discharge status: Home / Self Care | Payer: 59 | Source: Ambulatory Visit | Attending: Internal Medicine | Admitting: Internal Medicine

## 2013-11-01 ENCOUNTER — Other Ambulatory Visit: Payer: Self-pay | Admitting: Internal Medicine

## 2013-11-01 ENCOUNTER — Encounter: Payer: Self-pay | Admitting: Internal Medicine

## 2013-11-01 ENCOUNTER — Ambulatory Visit (INDEPENDENT_AMBULATORY_CARE_PROVIDER_SITE_OTHER): Payer: 59 | Admitting: Internal Medicine

## 2013-11-01 VITALS — BP 180/80 | HR 58 | Temp 97.8°F | Ht 67.75 in | Wt 153.0 lb

## 2013-11-01 DIAGNOSIS — M7150 Other bursitis, not elsewhere classified, unspecified site: Secondary | ICD-10-CM | POA: Insufficient documentation

## 2013-11-01 DIAGNOSIS — Z9181 History of falling: Secondary | ICD-10-CM

## 2013-11-01 DIAGNOSIS — M25569 Pain in unspecified knee: Secondary | ICD-10-CM

## 2013-11-01 NOTE — Progress Notes (Signed)
Chief Complaint  Patient presents with  . Fall    Fell 11 days ago on an [employer] [employer] [facility] [employer] [facility].  Hurt his left knee and thigh.  Has a black toe as well but has no pain in it.  . Knee Pain    HPI: Patient comes in today for SDA for  new problem evaluation.Doc of the day   PCP JJ Was in our again and fell on the escalator when he was turning around trying to pick up it dropped back and fell. His left knee had small abrasions and swelling and has since improved however he has some tenderness on the lateral upper knee area with a bump feeling he also noted some bruising on his distal left foot but no toe pain. No history of recurrent falling cardiovascular pulmonary or loss of consciousness. Denies having a current balance problem.   ROS: See pertinent positives and negatives per HPI. Some limping no fever no specific treatment he is under treatment for early memory issues.  Past Medical History  Diagnosis Date  . Nocturia   . Essential and other specified forms of tremor   . Hypersomnia with sleep apnea, unspecified   . Abdominal pain, right lower quadrant   . Acute prostatitis   . Spinal stenosis, lumbar region, without neurogenic claudication   . Fibromyalgia   . Urinary frequency   . Other testicular hypofunction   . Syncope and collapse   . Hypopotassemia   . Hemarthrosis, upper arm   . Unspecified adverse effect of unspecified drug, medicinal and biological substance   . Unspecified gastritis and gastroduodenitis without mention of hemorrhage   . Diverticulosis of colon (without mention of hemorrhage)   . Family history of malignant neoplasm of gastrointestinal tract   . Osteoporosis, unspecified   . Lumbago   . Ganglion and cyst of synovium, tendon, and bursa   . Hypertension   . GERD (gastroesophageal reflux disease)   . Hyperlipidemia   . Constipation   . Sleep apnea     last sleep study 11/11 on chart- Bipap with settings of 4 per  patient  . Localized osteoarthrosis not  specified whether primary or secondary, lower leg   . Degenerative disc disease   . Anxiety   . Depression   . Right inguinal hernia   . Memory difficulties 04/22/2013    Family History  Problem Relation Age of Onset  . Colon cancer Mother   . Dementia Mother   . Parkinsonism Father 62  . Liver disease Neg Hx   . Kidney disease Neg Hx   . Esophageal cancer Neg Hx   . Lung cancer Paternal Grandfather     smoker    History   Social History  . Marital Status: Married    Spouse Name: N/A    Number of Children: 2  . Years of Education: college   Occupational History  . retired    Social History Main Topics  . Smoking status: Never Smoker   . Smokeless tobacco: Never Used  . Alcohol Use: 1.8 oz/week    3 Glasses of wine per week     Comment: less than 5 per week  . Drug Use: No  . Sexual Activity: Yes   Other Topics Concern  . None   Social History Narrative  . None    Outpatient Encounter Prescriptions as of 11/01/2013  Medication Sig  . Calcium Carbonate-Vitamin D (CALCIUM 600 + D PO) Take 1 tablet by mouth 2 (two) times daily.   Marland Kitchen  cholecalciferol (VITAMIN D) 1000 UNITS tablet Take 5,000 Units by mouth daily.   . citalopram (CELEXA) 40 MG tablet TAKE 1 TABLET EVERY MORNING  . Coenzyme Q10 200 MG capsule Take 200 mg by mouth daily.   . folic acid (FOLVITE) 631 MCG tablet Take 400 mcg by mouth daily.  Marland Kitchen FORTEO 600 MCG/2.4ML SOLN INJECT 20MCG SUBCUTANEOUSLY ONCE DAILY. KEEP REFRIGERATED AND [id number] DAYS AFTER INITIAL USE. DO NOT FREEZE.  Marland Kitchen MAGNESIUM CITRATE PO Take 200 mg by mouth 2 (two) times daily.   . Nutritional Supplements (ANTI-OXIDANT COMPLEX PO) Take 10 mg by mouth daily.   . Olmesartan-Amlodipine-HCTZ 40-10-12.5 MG TABS Take 1 tablet by mouth daily.  Marland Kitchen OVER THE COUNTER MEDICATION Take 1 scoop by mouth 2 (two) times daily. Raw fiber  . OVER THE COUNTER MEDICATION Place 1 application into both eyes 2 (two) times daily as needed. Dry eyes.  Refresh liquid gel   . Phosphatidylserine-DHA-EPA 100-19.5-6.5 MG CAPS Take 1 capsule by mouth 2 (two) times daily before a meal.  . potassium chloride (KLOR-CON M10) 10 MEQ tablet Take 1 daily  . Probiotic Product (ALIGN) 4 MG CAPS Take by mouth daily.   . psyllium (REGULOID) 0.52 G capsule Take 0.52 g by mouth 2 (two) times daily.   Marland Kitchen pyridoxine (B-6) 100 MG tablet Take 100 mg by mouth daily.  . Red Yeast Rice 600 MG CAPS Take by mouth 2 (two) times daily.   . rivastigmine (EXELON) 4.6 mg/24hr Place 1 patch (4.6 mg total) onto the skin daily.  Marland Kitchen UNABLE TO FIND Tumeric 1 qd  . vitamin B-12 (CYANOCOBALAMIN) 50 MCG tablet Take 50 mcg by mouth daily.   . vitamin E 400 UNIT capsule Take 400 Units by mouth daily.  . [DISCONTINUED] testosterone cypionate (DEPOTESTOTERONE CYPIONATE) 200 MG/ML injection Inject 0.25 mLs (50 mg total) into the muscle every 7 (seven) days.    EXAM:  BP 180/80  Pulse 58  Temp(Src) 97.8 F (36.6 C) (Oral)  Ht 5' 7.75" (1.721 m)  Wt 153 lb (69.4 kg)  BMI 23.43 kg/m2  SpO2 98%  Body mass index is 23.43 kg/(m^2).  GENERAL: vitals reviewed and listed above, alert, oriented, appears well hydrated and in no acute distress HEENT: atraumatic, conjunctiva  clear, no obvious abnormalities on inspection of external nose and ears NECK: no obvious masses on inspection palpation t CV: HRRR, no clubbing cyanosis or  peripheral edema nl cap refill  MS: moves all extremities mildly antalgic gait.  PSYCH: pleasant and cooperative, no obvious depression or anxiety Left knee with healed punctate scab  1 + effusion peripatellar  Minimal redness no warmth or tenderness  Tender lateral quad slight bump noted   good rom   of  Knee.  Foot has some resolving bruising over the middle toes and lateral aspect of the foot with no point tenderness appears to be a gravitational bruise. Neurovascular is grossly intact on the extremity. ASSESSMENT AND PLAN:  Discussed the following assessment and plan:  Knee  pain - Plan: CANCELED: DG Knee 3 Views Left  Traumatic bursitis - Plan: CANCELED: DG Knee 3 Views Left  Hx of fall - Plan: CANCELED: DG Knee 3 Views Left Fall prevention  X-ray no acute findings does have atherosclerotic deposits in his arteries  -Patient advised to return or notify health care team  if symptoms worsen ,persist or new concerns arise.  Patient Instructions  This is probably a  Traumatic  bursitis and should get better with time  But if  persistent or progressive plan follow up possibly with sports medicine clinic.  For now get x ray of the knee and lower thigh area.  Can take advil aleve if needed for  pain swelling . Should improve over the next 2 weeks .  if  persistent or progressive after that then contact us . Consider seeing sports medicine then.   Standley Brooking. Panosh M.D.  Pre visit review using our clinic review tool, if applicable. No additional management support is needed unless otherwise documented below in the visit note.

## 2013-11-01 NOTE — Patient Instructions (Signed)
This is probably a  Traumatic  bursitis and should get better with time   But if  persistent or progressive plan follow up possibly with sports medicine clinic.  For now get x ray of the knee and lower thigh area.  Can take advil aleve if needed for  pain swelling . Should improve over the next 2 weeks .  if  persistent or progressive after that then contact us . Consider seeing sports medicine then.

## 2013-11-01 NOTE — Progress Notes (Signed)
Quick Note:  Tell patient that x ray shows no acute abnormality. ______ 

## 2013-11-22 ENCOUNTER — Encounter: Payer: Self-pay | Admitting: Internal Medicine

## 2013-11-22 ENCOUNTER — Other Ambulatory Visit: Payer: Self-pay

## 2013-11-22 MED ORDER — VALACYCLOVIR HCL 500 MG PO TABS: 500.0000 mg | ORAL_TABLET | Freq: Three times a day (TID) | ORAL | Status: DC

## 2013-12-22 ENCOUNTER — Telehealth: Payer: Self-pay | Admitting: Neurology

## 2013-12-22 MED ORDER — RIVASTIGMINE 9.5 MG/24HR TD PT24: 9.5000 mg | MEDICATED_PATCH | Freq: Every day | TRANSDERMAL | Status: DC

## 2013-12-22 NOTE — Telephone Encounter (Signed)
I will call in a 9.5 mg patch for the Exelon.

## 2013-12-22 NOTE — Telephone Encounter (Signed)
Pt called states his stomach is tolerating the medication rivastigmine (EXELON) 4.6 mg/24hr and that he would like to see if Dr. Jannifer Franklin would increase the dosage as per Dr. Jannifer Franklin last note he would increase as long as pt was able to tolerate the medication. Please call pt once this has been called in. Thanks

## 2013-12-22 NOTE — Telephone Encounter (Signed)
Patient called indicating he feels he is tolerating Exelon well and would like to proceed with a dose increase.  Please advise.  Thank you.

## 2014-01-04 ENCOUNTER — Encounter: Payer: Self-pay | Admitting: Internal Medicine

## 2014-01-04 DIAGNOSIS — Q782 Osteopetrosis: Secondary | ICD-10-CM

## 2014-01-05 NOTE — Telephone Encounter (Signed)
Referral placed.

## 2014-01-13 ENCOUNTER — Telehealth: Payer: Self-pay | Admitting: Internal Medicine

## 2014-01-13 NOTE — Telephone Encounter (Signed)
Pt has been a long time pt of dr Arnoldo Morale and would like to know if you will accept him? His wife sees dr Regis Bill here.

## 2014-01-14 ENCOUNTER — Encounter: Payer: Self-pay | Admitting: Neurology

## 2014-01-17 NOTE — Telephone Encounter (Signed)
We need to encourage some of these folks to see one of our new providers .  Unfortunately, I am getting a huge number of transfers with the retirements, etc.

## 2014-01-19 ENCOUNTER — Other Ambulatory Visit: Payer: Self-pay | Admitting: Neurology

## 2014-01-19 MED ORDER — RIVASTIGMINE 4.6 MG/24HR TD PT24: 4.6000 mg | MEDICATED_PATCH | Freq: Every day | TRANSDERMAL | Status: DC

## 2014-01-19 NOTE — Telephone Encounter (Signed)
Pt aware. Will wait and speak w/ dr Arnoldo Morale at his appt

## 2014-01-20 ENCOUNTER — Other Ambulatory Visit: Payer: Self-pay | Admitting: Internal Medicine

## 2014-02-01 ENCOUNTER — Ambulatory Visit (INDEPENDENT_AMBULATORY_CARE_PROVIDER_SITE_OTHER)
Admission: RE | Admit: 2014-02-01 | Discharge: 2014-02-01 | Disposition: A | Discharge status: Home / Self Care | Payer: 59 | Source: Ambulatory Visit | Attending: Internal Medicine | Admitting: Internal Medicine

## 2014-02-01 DIAGNOSIS — E291 Testicular hypofunction: Secondary | ICD-10-CM

## 2014-02-01 DIAGNOSIS — Q782 Osteopetrosis: Secondary | ICD-10-CM

## 2014-02-11 ENCOUNTER — Ambulatory Visit: Payer: 59 | Admitting: Internal Medicine

## 2014-02-14 ENCOUNTER — Telehealth: Payer: Self-pay | Admitting: Internal Medicine

## 2014-02-14 NOTE — Telephone Encounter (Addendum)
Pt wife is requesting repeat of same labs pt had in  12-2012. Pt is coming in for follow up on back pain on 03-07-14. Please advise

## 2014-02-17 ENCOUNTER — Encounter: Payer: Self-pay | Admitting: Internal Medicine

## 2014-02-17 NOTE — Telephone Encounter (Signed)
Dr. Arnoldo Morale will determine what labs need to be drawn at appt.

## 2014-02-17 NOTE — Telephone Encounter (Signed)
lmom for wife to cb

## 2014-02-17 NOTE — Telephone Encounter (Signed)
Pt wife is aware

## 2014-02-22 ENCOUNTER — Telehealth: Payer: Self-pay | Admitting: Internal Medicine

## 2014-02-22 NOTE — Telephone Encounter (Signed)
yes

## 2014-02-22 NOTE — Telephone Encounter (Signed)
Pt states his niece is a pt of yours. She is moving to [place] and pt would like to know if you will accept him? In other words: Can he have her "spot". He asked?!!  Pt used to see dr Arnoldo Morale

## 2014-02-23 NOTE — Telephone Encounter (Signed)
NA. Will try back tomorrow.

## 2014-02-25 NOTE — Telephone Encounter (Signed)
Pt aware and will sch after appt w/ dr Arnoldo Morale

## 2014-03-02 ENCOUNTER — Other Ambulatory Visit (INDEPENDENT_AMBULATORY_CARE_PROVIDER_SITE_OTHER): Payer: 59

## 2014-03-02 DIAGNOSIS — Z0000 Encounter for general adult medical examination without abnormal findings: Secondary | ICD-10-CM

## 2014-03-02 LAB — POCT URINALYSIS DIPSTICK
Bilirubin, UA: NEGATIVE
GLUCOSE UA: NEGATIVE
Ketones, UA: NEGATIVE
Leukocytes, UA: NEGATIVE
Nitrite, UA: NEGATIVE
Spec Grav, UA: 1.015
Urobilinogen, UA: 0.2
pH, UA: 7

## 2014-03-02 LAB — CBC WITH DIFFERENTIAL/PLATELET
BASOS PCT: 0.4 % (ref 0.0–3.0)
Basophils Absolute: 0 10*3/uL (ref 0.0–0.1)
EOS PCT: 1.9 % (ref 0.0–5.0)
Eosinophils Absolute: 0.1 10*3/uL (ref 0.0–0.7)
HEMATOCRIT: 40.6 % (ref 39.0–52.0)
HEMOGLOBIN: 13.9 g/dL (ref 13.0–17.0)
LYMPHS ABS: 1.6 10*3/uL (ref 0.7–4.0)
Lymphocytes Relative: 28 % (ref 12.0–46.0)
MCHC: 34.3 g/dL (ref 30.0–36.0)
MCV: 94.4 fl (ref 78.0–100.0)
MONOS PCT: 8.9 % (ref 3.0–12.0)
Monocytes Absolute: 0.5 10*3/uL (ref 0.1–1.0)
NEUTROS ABS: 3.6 10*3/uL (ref 1.4–7.7)
Neutrophils Relative %: 60.8 % (ref 43.0–77.0)
Platelets: 179 10*3/uL (ref 150.0–400.0)
RBC: 4.3 Mil/uL (ref 4.22–5.81)
RDW: 13.9 % (ref 11.5–15.5)
WBC: 5.8 10*3/uL (ref 4.0–10.5)

## 2014-03-02 LAB — LIPID PANEL
Cholesterol: 203 mg/dL — ABNORMAL HIGH (ref 0–200)
HDL: 76.9 mg/dL (ref >39.00)
LDL Cholesterol: 118 mg/dL — ABNORMAL HIGH (ref 0–99)
NonHDL: 126.1
TRIGLYCERIDES: 41 mg/dL (ref 0.0–149.0)
Total CHOL/HDL Ratio: 3
VLDL: 8.2 mg/dL (ref 0.0–40.0)

## 2014-03-02 LAB — BASIC METABOLIC PANEL WITH GFR
BUN: 20 mg/dL (ref 6–23)
CO2: 34 meq/L — ABNORMAL HIGH (ref 19–32)
Calcium: 9.3 mg/dL (ref 8.4–10.5)
Chloride: 99 meq/L (ref 96–112)
Creatinine, Ser: 0.9 mg/dL (ref 0.4–1.5)
GFR: 85.6 mL/min
Glucose, Bld: 86 mg/dL (ref 70–99)
Potassium: 3.1 meq/L — ABNORMAL LOW (ref 3.5–5.1)
Sodium: 140 meq/L (ref 135–145)

## 2014-03-02 LAB — HEPATIC FUNCTION PANEL
ALBUMIN: 4.2 g/dL (ref 3.5–5.2)
ALT: 16 U/L (ref 0–53)
AST: 21 U/L (ref 0–37)
Alkaline Phosphatase: 77 U/L (ref 39–117)
Bilirubin, Direct: 0.1 mg/dL (ref 0.0–0.3)
TOTAL PROTEIN: 7 g/dL (ref 6.0–8.3)
Total Bilirubin: 0.9 mg/dL (ref 0.2–1.2)

## 2014-03-02 LAB — TSH: TSH: 1.76 u[IU]/mL (ref 0.35–4.50)

## 2014-03-02 LAB — PSA: PSA: 0.95 ng/mL (ref 0.10–4.00)

## 2014-03-07 ENCOUNTER — Ambulatory Visit (INDEPENDENT_AMBULATORY_CARE_PROVIDER_SITE_OTHER): Payer: 59 | Admitting: Internal Medicine

## 2014-03-07 ENCOUNTER — Encounter: Payer: Self-pay | Admitting: Internal Medicine

## 2014-03-07 VITALS — BP 132/84 | HR 60 | Temp 98.5°F | Ht 68.0 in | Wt 157.0 lb

## 2014-03-07 DIAGNOSIS — Z23 Encounter for immunization: Secondary | ICD-10-CM

## 2014-03-07 DIAGNOSIS — M546 Pain in thoracic spine: Secondary | ICD-10-CM

## 2014-03-07 DIAGNOSIS — M25559 Pain in unspecified hip: Secondary | ICD-10-CM

## 2014-03-07 NOTE — Progress Notes (Signed)
Pre visit review using our clinic review tool, if applicable. No additional management support is needed unless otherwise documented below in the visit note. 

## 2014-03-07 NOTE — Addendum Note (Signed)
Addended by: Ricard Dillon on: 03/07/2014 05:10 PM   Modules accepted: Level of Service

## 2014-03-07 NOTE — Progress Notes (Signed)
Subjective:    Patient ID: [name], male    DOB: [birth year], 68 y.o.   MRN: [id number]  HPI  Chronic pain control and  Off the pain medicatons for the pain in back and hips Off the opiates due to the complacations Failed TENS and implant On Magnesium Suggestion of MSN added for pain    Review of Systems  Constitutional: Positive for fatigue. Negative for fever.  HENT: Negative for congestion, hearing loss and postnasal drip.   Eyes: Negative for discharge, redness and visual disturbance.  Respiratory: Negative for cough, shortness of breath and wheezing.   Cardiovascular: Negative for leg swelling.  Gastrointestinal: Negative for abdominal pain, constipation and abdominal distention.  Genitourinary: Negative for urgency and frequency.  Musculoskeletal: Positive for arthralgias, back pain, gait problem and joint swelling. Negative for neck pain.  Skin: Negative for color change and rash.  Neurological: Positive for syncope and light-headedness. Negative for weakness.  Hematological: Negative for adenopathy.  Psychiatric/Behavioral: Negative for behavioral problems.   Past Medical History  Diagnosis Date  . Nocturia   . Essential and other specified forms of tremor   . Hypersomnia with sleep apnea, unspecified   . Abdominal pain, right lower quadrant   . Acute prostatitis   . Spinal stenosis, lumbar region, without neurogenic claudication   . Fibromyalgia   . Urinary frequency   . Other testicular hypofunction   . Syncope and collapse   . Hypopotassemia   . Hemarthrosis, upper arm   . Unspecified adverse effect of unspecified drug, medicinal and biological substance   . Unspecified gastritis and gastroduodenitis without mention of hemorrhage   . Diverticulosis of colon (without mention of hemorrhage)   . Family history of malignant neoplasm of gastrointestinal tract   . Osteoporosis, unspecified   . Lumbago   . Ganglion and cyst of synovium, tendon, and bursa     . Hypertension   . GERD (gastroesophageal reflux disease)   . Hyperlipidemia   . Constipation   . Sleep apnea     last sleep study 11/11 on chart- Bipap with settings of 4 per  patient  . Localized osteoarthrosis not specified whether primary or secondary, lower leg   . Degenerative disc disease   . Anxiety   . Depression   . Right inguinal hernia   . Memory difficulties 04/22/2013    History   Social History  . Marital Status: Married    Spouse Name: N/A    Number of Children: 2  . Years of Education: college   Occupational History  . retired    Social History Main Topics  . Smoking status: Never Smoker   . Smokeless tobacco: Never Used  . Alcohol Use: 1.8 oz/week    3 Glasses of wine per week     Comment: less than 5 per week  . Drug Use: No  . Sexual Activity: Yes   Other Topics Concern  . Not on file   Social History Narrative  . No narrative on file    Past Surgical History  Procedure Laterality Date  . Back surgery      x 3  . Microdisectomy  03/02/2003, 06/29/2003, 12/24/2005  . Hernia repair  1965, 1968  . Tonsillectomy    . Inguinal hernia repair  07/19/2011    Procedure: LAPAROSCOPIC INGUINAL HERNIA;  Surgeon: Odis Hollingshead, MD;  Location: [facility];  Service: General;  Laterality: Right;  Laparoscopic Repair of Recurrent Right  Ingunial Hernia with Mesh  Family History  Problem Relation Age of Onset  . Colon cancer Mother   . Dementia Mother   . Parkinsonism Father 78  . Liver disease Neg Hx   . Kidney disease Neg Hx   . Esophageal cancer Neg Hx   . Lung cancer Paternal Grandfather     smoker    Allergies  Allergen Reactions  . Nucynta [Tapentadol] Other (See Comments)    dizziness  . Pregabalin     REACTION: dizziness and mental status changes    Current Outpatient Prescriptions on File Prior to Visit  Medication Sig Dispense Refill  . Calcium Carbonate-Vitamin D (CALCIUM 600 + D PO) Take 1 tablet by mouth 2 (two) times daily.        . cholecalciferol (VITAMIN D) 1000 UNITS tablet Take 5,000 Units by mouth daily.       . citalopram (CELEXA) 40 MG tablet TAKE 1 TABLET EVERY MORNING  90 tablet  3  . Coenzyme Q10 200 MG capsule Take 200 mg by mouth daily.       . folic acid (FOLVITE) 834 MCG tablet Take 400 mcg by mouth daily.      Marland Kitchen MAGNESIUM CITRATE PO Take 200 mg by mouth 2 (two) times daily.       . Nutritional Supplements (ANTI-OXIDANT COMPLEX PO) Take 10 mg by mouth daily.       . Olmesartan-Amlodipine-HCTZ 40-10-12.5 MG TABS Take 1 tablet by mouth daily.  90 tablet  3  . OVER THE COUNTER MEDICATION Take 1 scoop by mouth 2 (two) times daily. Raw fiber      . OVER THE COUNTER MEDICATION Place 1 application into both eyes 2 (two) times daily as needed. Dry eyes.  Refresh liquid gel      . Phosphatidylserine-DHA-EPA 100-19.5-6.5 MG CAPS Take 1 capsule by mouth 2 (two) times daily before a meal.  60 capsule  11  . potassium chloride (KLOR-CON M10) 10 MEQ tablet Take 1 daily  90 tablet  3  . Probiotic Product (ALIGN) 4 MG CAPS Take by mouth daily.       . psyllium (REGULOID) 0.52 G capsule Take 0.52 g by mouth 2 (two) times daily.       Marland Kitchen pyridoxine (B-6) 100 MG tablet Take 100 mg by mouth daily.      . Red Yeast Rice 600 MG CAPS Take by mouth 2 (two) times daily.       Marland Kitchen UNABLE TO FIND Tumeric 1 qd      . valACYclovir (VALTREX) 500 MG tablet Take 1 tablet (500 mg total) by mouth 3 (three) times daily.  30 tablet  1  . vitamin B-12 (CYANOCOBALAMIN) 50 MCG tablet Take 50 mcg by mouth daily.       . vitamin E 400 UNIT capsule Take 400 Units by mouth daily.      . [DISCONTINUED] potassium chloride (K-DUR) 10 MEQ tablet Take 1 tablet (10 mEq total) by mouth daily. 1 twice a day for 3 days and then 1 every dat  90 tablet  3   No current facility-administered medications on file prior to visit.    BP 132/84  Pulse 60  Temp(Src) 98.5 F (36.9 C) (Oral)  Ht 5\' 8"  (1.727 m)  Wt 157 lb (71.215 kg)  BMI 23.88 kg/m2  SpO2  98%       Objective:   Physical Exam  Vitals reviewed. Constitutional: He is oriented to person, place, and time. He appears well-developed and well-nourished.  HENT:  Head: Normocephalic and atraumatic.  Eyes: Conjunctivae are normal. Pupils are equal, round, and reactive to light.  Cardiovascular: Normal rate and regular rhythm.   Pulmonary/Chest: Effort normal and breath sounds normal.  Abdominal: Soft. Bowel sounds are normal.  Neurological: He is alert and oriented to person, place, and time.  Skin: Skin is dry.          Assessment & Plan:  We'll measure magnesium level today and plan to bump magnesium as tolerated for chronic back pain.  Also suggest adding MSN as an herbal supplement. Pneumonia shot given today Tetanus shot given today Td HTN

## 2014-03-07 NOTE — Patient Instructions (Signed)
Increase the magnesium to 400 then 600 mg Add MSN as a pain suppliment

## 2014-04-28 ENCOUNTER — Encounter: Payer: Self-pay | Admitting: Neurology

## 2014-04-28 ENCOUNTER — Ambulatory Visit (INDEPENDENT_AMBULATORY_CARE_PROVIDER_SITE_OTHER): Payer: 59 | Admitting: Neurology

## 2014-04-28 VITALS — BP 164/72 | HR 54 | Wt 161.0 lb

## 2014-04-28 DIAGNOSIS — R413 Other amnesia: Secondary | ICD-10-CM

## 2014-04-28 MED ORDER — MEMANTINE HCL 28 X 5 MG & 21 X 10 MG PO TABS: ORAL_TABLET | ORAL | Status: DC

## 2014-04-28 NOTE — Patient Instructions (Signed)
   Please call when you get to the end of the namenda titration pack, and I will call in a prescription for the maintanence dose.

## 2014-04-28 NOTE — Progress Notes (Signed)
Reason for visit: Memory disorder  [name] is an 68 y.o. male  History of present illness:  [name] is a 68 year old right-handed white male with a history of a progressive memory disorder. The patient has not been able to tolerate the Exelon patch, as it resulted in stomach upset and skin irritation. He has gone off the patch. The patient is also having a lot of problems with low back pain, and he is followed by Dr. Nicholaus Bloom. None of the therapy measures for the low back seem to be effective. The patient does have some mild balance issues. He cognitively is still functioning fairly well, but his cognitive activities have slowed down. The patient is having increasing problems balancing the checkbook. He still operates a Teacher, music without difficulty. The patient will commonly misplace things about the house, going into rooms without remembering what he is doing. He returns for an evaluation.  Past Medical History  Diagnosis Date  . Nocturia   . Essential and other specified forms of tremor   . Hypersomnia with sleep apnea, unspecified   . Abdominal pain, right lower quadrant   . Acute prostatitis   . Spinal stenosis, lumbar region, without neurogenic claudication   . Fibromyalgia   . Urinary frequency   . Other testicular hypofunction   . Syncope and collapse   . Hypopotassemia   . Hemarthrosis, upper arm   . Unspecified adverse effect of unspecified drug, medicinal and biological substance   . Unspecified gastritis and gastroduodenitis without mention of hemorrhage   . Diverticulosis of colon (without mention of hemorrhage)   . Family history of malignant neoplasm of gastrointestinal tract   . Osteoporosis, unspecified   . Lumbago   . Ganglion and cyst of synovium, tendon, and bursa   . Hypertension   . GERD (gastroesophageal reflux disease)   . Hyperlipidemia   . Constipation   . Sleep apnea     last sleep study 11/11 on chart- Bipap with settings of 4 per   patient  . Localized osteoarthrosis not specified whether primary or secondary, lower leg   . Degenerative disc disease   . Anxiety   . Depression   . Right inguinal hernia   . Memory difficulties 04/22/2013    Past Surgical History  Procedure Laterality Date  . Back surgery      x 3  . Microdisectomy  03/02/2003, 06/29/2003, 12/24/2005  . Hernia repair  1965, 1968  . Tonsillectomy    . Inguinal hernia repair  07/19/2011    Procedure: LAPAROSCOPIC INGUINAL HERNIA;  Surgeon: Odis Hollingshead, MD;  Location: [facility];  Service: General;  Laterality: Right;  Laparoscopic Repair of Recurrent Right  Ingunial Hernia with Mesh    Family History  Problem Relation Age of Onset  . Colon cancer Mother   . Dementia Mother   . Parkinsonism Father 86  . Liver disease Neg Hx   . Kidney disease Neg Hx   . Esophageal cancer Neg Hx   . Lung cancer Paternal Grandfather     smoker    Social history:  reports that he has never smoked. He has never used smokeless tobacco. He reports that he drinks about 1.8 ounces of alcohol per week. He reports that he does not use illicit drugs.    Allergies  Allergen Reactions  . Nucynta [Tapentadol] Other (See Comments)    dizziness  . Pregabalin     REACTION: dizziness and mental status changes  .  Exelon [Rivastigmine]     abd pain    Medications:  Current Outpatient Prescriptions on File Prior to Visit  Medication Sig Dispense Refill  . Calcium Carbonate-Vitamin D (CALCIUM 600 + D PO) Take 1 tablet by mouth 2 (two) times daily.       . cholecalciferol (VITAMIN D) 1000 UNITS tablet Take 5,000 Units by mouth daily.       . citalopram (CELEXA) 40 MG tablet TAKE 1 TABLET EVERY MORNING  90 tablet  3  . Coenzyme Q10 200 MG capsule Take 200 mg by mouth daily.       . folic acid (FOLVITE) 284 MCG tablet Take 400 mcg by mouth daily.      Marland Kitchen MAGNESIUM CITRATE PO Take 200 mg by mouth 2 (two) times daily.       . Nutritional Supplements (ANTI-OXIDANT COMPLEX PO)  Take 10 mg by mouth daily.       . Olmesartan-Amlodipine-HCTZ 40-10-12.5 MG TABS Take 1 tablet by mouth daily.  90 tablet  3  . OVER THE COUNTER MEDICATION Take 1 scoop by mouth 2 (two) times daily. Raw fiber      . OVER THE COUNTER MEDICATION Place 1 application into both eyes 2 (two) times daily as needed. Dry eyes.  Refresh liquid gel      . potassium chloride (KLOR-CON M10) 10 MEQ tablet Take 1 daily  90 tablet  3  . Probiotic Product (ALIGN) 4 MG CAPS Take by mouth daily.       . psyllium (REGULOID) 0.52 G capsule Take 0.52 g by mouth 2 (two) times daily.       Marland Kitchen pyridoxine (B-6) 100 MG tablet Take 100 mg by mouth daily.      . Red Yeast Rice 600 MG CAPS Take by mouth 2 (two) times daily.       Marland Kitchen UNABLE TO FIND Tumeric 1 qd      . valACYclovir (VALTREX) 500 MG tablet Take 1 tablet (500 mg total) by mouth 3 (three) times daily.  30 tablet  1  . vitamin B-12 (CYANOCOBALAMIN) 50 MCG tablet Take 50 mcg by mouth daily.       . vitamin E 400 UNIT capsule Take 400 Units by mouth daily.      . [DISCONTINUED] potassium chloride (K-DUR) 10 MEQ tablet Take 1 tablet (10 mEq total) by mouth daily. 1 twice a day for 3 days and then 1 every dat  90 tablet  3   No current facility-administered medications on file prior to visit.    ROS:  Out of a complete 14 system review of symptoms, the patient complains only of the following symptoms, and all other reviewed systems are negative.  Constipation Memory disturbance Low back pain  Blood pressure 164/72, pulse 54, weight 161 lb (73.029 kg).  Physical Exam  General: The patient is alert and cooperative at the time of the examination.   Neuromuscular: Internal and external rotation of the hips elicits no discomfort.  Skin: No significant peripheral edema is noted.   Neurologic Exam  Mental status: The patient is oriented x 3. Mini-Mental status examination done today shows a total score of 25/30.  Cranial nerves: Facial symmetry is present.  Speech is normal, no aphasia or dysarthria is noted. Extraocular movements are full. Visual fields are full.  Motor: The patient has good strength in all 4 extremities.  Sensory examination: Soft touch sensation is symmetric on the face, arms, and legs.  Coordination: The patient has  good finger-nose-finger and heel-to-shin bilaterally. No evidence of apraxia is seen.  Gait and station: The patient has a normal gait. Tandem gait is normal. Romberg is negative. No drift is seen.  Reflexes: Deep tendon reflexes are symmetric.   MRI lumbar 10/06/11:  IMPRESSION:  Progressive disc desiccation with improvement in the L1-L2 left  foraminal protrusion and left foraminal stenosis. Other levels  appear unchanged. Postoperative changes at L5-S1, L4-L5 and L3-L4.    Assessment/Plan:  1. Mild memory disturbance  2. Chronic low back pain  The patient will be given a trial on Namenda. The Namenda titration pack was given, and the patient will contact our office in one month if he is tolerating the medication, and a maintenance dose will be called in. He will followup otherwise in 6 or 7 months.  Jill Alexanders MD 04/28/2014 7:43 PM  [facility] [address] [place], [place] [address]  Phone [phone] Fax [phone]

## 2014-05-02 ENCOUNTER — Other Ambulatory Visit: Payer: Self-pay | Admitting: Internal Medicine

## 2014-05-09 ENCOUNTER — Other Ambulatory Visit: Payer: Self-pay | Admitting: Internal Medicine

## 2014-05-26 ENCOUNTER — Telehealth: Payer: Self-pay | Admitting: Neurology

## 2014-05-26 MED ORDER — MEMANTINE HCL 10 MG PO TABS: 10.0000 mg | ORAL_TABLET | Freq: Two times a day (BID) | ORAL | Status: DC

## 2014-05-26 NOTE — Telephone Encounter (Signed)
Patient requesting Rx for  memantine [facility] TITRATION PAK) tablet pack.  Felt sample pack given was beneficial.  Please forward to [facility].  Please call and advise.

## 2014-05-26 NOTE — Telephone Encounter (Signed)
Rx has been sent.  I called patient back, got no answer.  Left message.

## 2014-06-14 ENCOUNTER — Ambulatory Visit (INDEPENDENT_AMBULATORY_CARE_PROVIDER_SITE_OTHER): Payer: 59

## 2014-06-14 DIAGNOSIS — Z23 Encounter for immunization: Secondary | ICD-10-CM

## 2014-07-08 ENCOUNTER — Encounter: Payer: Self-pay | Admitting: Family Medicine

## 2014-07-08 ENCOUNTER — Other Ambulatory Visit (INDEPENDENT_AMBULATORY_CARE_PROVIDER_SITE_OTHER): Payer: Self-pay

## 2014-07-08 ENCOUNTER — Ambulatory Visit (INDEPENDENT_AMBULATORY_CARE_PROVIDER_SITE_OTHER): Payer: 59 | Admitting: Family Medicine

## 2014-07-08 ENCOUNTER — Ambulatory Visit: Payer: 59 | Admitting: Family Medicine

## 2014-07-08 VITALS — BP 136/70 | HR 58 | Temp 98.0°F | Wt 165.0 lb

## 2014-07-08 DIAGNOSIS — R1031 Right lower quadrant pain: Secondary | ICD-10-CM

## 2014-07-08 DIAGNOSIS — Z8719 Personal history of other diseases of the digestive system: Secondary | ICD-10-CM

## 2014-07-08 DIAGNOSIS — Z9889 Other specified postprocedural states: Secondary | ICD-10-CM

## 2014-07-08 DIAGNOSIS — Z9181 History of falling: Secondary | ICD-10-CM

## 2014-07-08 DIAGNOSIS — R3915 Urgency of urination: Secondary | ICD-10-CM

## 2014-07-08 DIAGNOSIS — E876 Hypokalemia: Secondary | ICD-10-CM

## 2014-07-08 DIAGNOSIS — M810 Age-related osteoporosis without current pathological fracture: Secondary | ICD-10-CM

## 2014-07-08 LAB — BASIC METABOLIC PANEL
BUN: 21 mg/dL (ref 6–23)
CO2: 32 mEq/L (ref 19–32)
CREATININE: 1.1 mg/dL (ref 0.4–1.5)
Calcium: 9.3 mg/dL (ref 8.4–10.5)
Chloride: 99 mEq/L (ref 96–112)
GFR: 72.73 mL/min (ref >60.00)
Glucose, Bld: 87 mg/dL (ref 70–99)
POTASSIUM: 3.3 meq/L — AB (ref 3.5–5.1)
Sodium: 141 mEq/L (ref 135–145)

## 2014-07-08 MED ORDER — MIRABEGRON ER 25 MG PO TB24: 25.0000 mg | ORAL_TABLET | Freq: Every day | ORAL | Status: DC

## 2014-07-08 NOTE — Progress Notes (Signed)
Subjective:    Patient ID: [name], male    DOB: 06-19-1946, 68 y.o.   MRN: [id number]  HPI  Patient is here to establish care with me. He has history of multiple chronic problems including essential tremor, urinary urgency, chronic back pain with lumbar spinal stenosis, central sleep apnea, osteoporosis, osteoarthritis, cognitive impairment, GERD, chronic constipation.  He's been followed by multiple specialists in the past. He sees neurology and has essential tremor and currently is not on any beta blockers. He is on Namenda for cognitive impairment and he feels that is relatively stable. He has several issues he wishes to discuss today as follows:  He's had osteoporosis diagnosed 8 years ago. Took Forteo for 2 years. He had bone density was past June which was relatively stable. Takes regular calcium and vitamin D.  Complains of lightheadedness and instability sometimes with walking. Had 2 falls in the past year. No consistent orthostatic symptoms. In general he feels he has gotten somewhat weaker and he has not had any recent physical therapy  Urinary urgency especially at night. Frequent nocturia 5 times per night. No obstructive symptoms.  Hypertension and blood pressures vary tremendously but mostly less than 557 systolic. His history of chronic hypokalemia with most recent potassium 3.1. He takes K-Dur 10 milliequivalents daily  Past Medical History  Diagnosis Date  . Nocturia   . Essential and other specified forms of tremor   . Hypersomnia with sleep apnea, unspecified   . Abdominal pain, right lower quadrant   . Acute prostatitis   . Spinal stenosis, lumbar region, without neurogenic claudication   . Fibromyalgia   . Urinary frequency   . Other testicular hypofunction   . Syncope and collapse   . Hypopotassemia   . Hemarthrosis, upper arm   . Unspecified adverse effect of unspecified drug, medicinal and biological substance   . Unspecified gastritis and  gastroduodenitis without mention of hemorrhage   . Diverticulosis of colon (without mention of hemorrhage)   . Family history of malignant neoplasm of gastrointestinal tract   . Osteoporosis, unspecified   . Lumbago   . Ganglion and cyst of synovium, tendon, and bursa   . Hypertension   . GERD (gastroesophageal reflux disease)   . Hyperlipidemia   . Constipation   . Sleep apnea     last sleep study 11/11 on chart- Bipap with settings of 4 per  patient  . Localized osteoarthrosis not specified whether primary or secondary, lower leg   . Degenerative disc disease   . Anxiety   . Depression   . Right inguinal hernia   . Memory difficulties 04/22/2013   Past Surgical History  Procedure Laterality Date  . Back surgery      x 3  . Microdisectomy  03/02/2003, 06/29/2003, 12/24/2005  . Hernia repair  1965, 1968  . Tonsillectomy    . Inguinal hernia repair  07/19/2011    Procedure: LAPAROSCOPIC INGUINAL HERNIA;  Surgeon: Odis Hollingshead, MD;  Location: [facility];  Service: General;  Laterality: Right;  Laparoscopic Repair of Recurrent Right  Ingunial Hernia with Mesh    reports that he has never smoked. He has never used smokeless tobacco. He reports that he drinks about 1.8 oz of alcohol per week. He reports that he does not use illicit drugs. family history includes Colon cancer in his mother; Dementia in his mother; Lung cancer in his paternal grandfather; Parkinsonism (age of onset: 67) in his father. There is no history of Liver  disease, Kidney disease, or Esophageal cancer. Allergies  Allergen Reactions  . Nucynta [Tapentadol] Other (See Comments)    dizziness  . Pregabalin     REACTION: dizziness and mental status changes  . Exelon [Rivastigmine]     abd pain      Review of Systems  Constitutional: Negative for fatigue.  Eyes: Negative for visual disturbance.  Respiratory: Negative for cough, chest tightness and shortness of breath.   Cardiovascular: Negative for chest pain,  palpitations and leg swelling.  Gastrointestinal: Negative for abdominal pain.  Genitourinary: Positive for urgency and frequency. Negative for decreased urine volume.  Musculoskeletal: Positive for back pain.  Neurological: Negative for dizziness, syncope, weakness, light-headedness and headaches.  Psychiatric/Behavioral: Positive for sleep disturbance and dysphoric mood. Negative for suicidal ideas.       Objective:   Physical Exam  Constitutional: He appears well-developed and well-nourished.  HENT:  Right Ear: External ear normal.  Left Ear: External ear normal.  Mouth/Throat: Oropharynx is clear and moist.  Neck: Neck supple. No thyromegaly present.  Cardiovascular: Normal rate and regular rhythm.   Pulmonary/Chest: Effort normal and breath sounds normal. No respiratory distress. He has no wheezes. He has no rales.  Musculoskeletal: He exhibits no edema.          Assessment & Plan:  #1 gait instability and high risk for fall. We recommended physical therapy referral for fall risk evaluation and strengthening exercises and fall reduction #2 urinary urgency. Avoid anticholinergics with chronic constipation history. Trial of lumbar Bacher 25 g daily at bedtime  #3 history of osteoporosis. Reiterated importance of weightbearing and adequate vitamin D and calcium. Repeat DEXA scan in 2 years #4 essential tremor stable  #5 history of cognitive impairment stable on Namenda. Followed by neurology #6 history of chronic hypokalemia. He does take HCTZ. Recheck basic metabolic panel. Increase potassium if still low

## 2014-07-08 NOTE — Progress Notes (Signed)
Pre visit review using our clinic review tool, if applicable. No additional management support is needed unless otherwise documented below in the visit note. 

## 2014-07-11 ENCOUNTER — Other Ambulatory Visit: Payer: Self-pay

## 2014-07-11 ENCOUNTER — Other Ambulatory Visit: Payer: Self-pay | Admitting: Family Medicine

## 2014-07-11 DIAGNOSIS — E876 Hypokalemia: Secondary | ICD-10-CM

## 2014-07-11 MED ORDER — POTASSIUM CHLORIDE CRYS ER 20 MEQ PO TBCR: 20.0000 meq | EXTENDED_RELEASE_TABLET | Freq: Every day | ORAL | Status: DC

## 2014-07-18 ENCOUNTER — Ambulatory Visit
Admission: RE | Admit: 2014-07-18 | Discharge: 2014-07-18 | Disposition: A | Discharge status: Home / Self Care | Payer: 59 | Source: Ambulatory Visit | Attending: General Surgery | Admitting: General Surgery

## 2014-07-18 DIAGNOSIS — Z8719 Personal history of other diseases of the digestive system: Secondary | ICD-10-CM

## 2014-07-18 DIAGNOSIS — Z9889 Other specified postprocedural states: Secondary | ICD-10-CM

## 2014-07-18 DIAGNOSIS — R1031 Right lower quadrant pain: Secondary | ICD-10-CM

## 2014-07-18 MED ORDER — IOHEXOL 300 MG/ML  SOLN
100.0000 mL | Freq: Once | INTRAMUSCULAR | Status: AC | PRN
  Administered 2014-07-18: 100 mL via INTRAVENOUS

## 2014-07-19 ENCOUNTER — Telehealth (INDEPENDENT_AMBULATORY_CARE_PROVIDER_SITE_OTHER): Payer: Self-pay | Admitting: General Surgery

## 2014-07-19 NOTE — Telephone Encounter (Signed)
Called pt and discussed results of CT.  No apparent hernia or other surgical issues.  Will continue F/U with pain management.

## 2014-07-20 ENCOUNTER — Ambulatory Visit: Payer: 59 | Attending: Family Medicine | Admitting: Physical Therapy

## 2014-07-20 DIAGNOSIS — M545 Low back pain: Secondary | ICD-10-CM | POA: Diagnosis present

## 2014-07-22 ENCOUNTER — Ambulatory Visit: Payer: 59 | Admitting: Physical Therapy

## 2014-07-22 DIAGNOSIS — M545 Low back pain: Secondary | ICD-10-CM | POA: Diagnosis not present

## 2014-07-25 ENCOUNTER — Ambulatory Visit: Payer: 59 | Admitting: Physical Therapy

## 2014-07-25 DIAGNOSIS — M545 Low back pain: Secondary | ICD-10-CM | POA: Diagnosis not present

## 2014-07-27 ENCOUNTER — Ambulatory Visit: Payer: 59 | Admitting: Physical Therapy

## 2014-07-27 DIAGNOSIS — M545 Low back pain: Secondary | ICD-10-CM | POA: Diagnosis not present

## 2014-08-01 ENCOUNTER — Ambulatory Visit: Payer: 59 | Admitting: Physical Therapy

## 2014-08-01 DIAGNOSIS — M545 Low back pain: Secondary | ICD-10-CM | POA: Diagnosis not present

## 2014-08-03 ENCOUNTER — Ambulatory Visit: Payer: 59 | Admitting: Physical Therapy

## 2014-08-03 DIAGNOSIS — M545 Low back pain: Secondary | ICD-10-CM | POA: Diagnosis not present

## 2014-08-08 ENCOUNTER — Ambulatory Visit: Payer: 59 | Admitting: Physical Therapy

## 2014-08-08 DIAGNOSIS — M545 Low back pain: Secondary | ICD-10-CM | POA: Diagnosis not present

## 2014-08-10 ENCOUNTER — Ambulatory Visit: Payer: 59 | Admitting: Physical Therapy

## 2014-08-15 ENCOUNTER — Ambulatory Visit: Payer: 59 | Admitting: Physical Therapy

## 2014-08-17 ENCOUNTER — Ambulatory Visit: Payer: 59 | Admitting: Physical Therapy

## 2014-08-22 ENCOUNTER — Encounter: Payer: 59 | Admitting: Physical Therapy

## 2014-08-24 ENCOUNTER — Encounter: Payer: 59 | Admitting: Physical Therapy

## 2014-10-14 ENCOUNTER — Other Ambulatory Visit (INDEPENDENT_AMBULATORY_CARE_PROVIDER_SITE_OTHER): Payer: PPO

## 2014-10-14 DIAGNOSIS — E876 Hypokalemia: Secondary | ICD-10-CM

## 2014-10-14 LAB — BASIC METABOLIC PANEL
BUN: 17 mg/dL (ref 6–23)
CO2: 35 meq/L — AB (ref 19–32)
Calcium: 9.5 mg/dL (ref 8.4–10.5)
Chloride: 100 mEq/L (ref 96–112)
Creatinine, Ser: 1.08 mg/dL (ref 0.40–1.50)
GFR: 71.9 mL/min (ref >60.00)
Glucose, Bld: 139 mg/dL — ABNORMAL HIGH (ref 70–99)
POTASSIUM: 3.2 meq/L — AB (ref 3.5–5.1)
Sodium: 140 mEq/L (ref 135–145)

## 2014-11-03 ENCOUNTER — Encounter: Payer: Self-pay | Admitting: Neurology

## 2014-11-03 ENCOUNTER — Ambulatory Visit (INDEPENDENT_AMBULATORY_CARE_PROVIDER_SITE_OTHER): Payer: PPO | Admitting: Neurology

## 2014-11-03 VITALS — BP 136/73 | HR 57 | Ht 68.0 in | Wt 159.8 lb

## 2014-11-03 DIAGNOSIS — M545 Low back pain, unspecified: Secondary | ICD-10-CM

## 2014-11-03 DIAGNOSIS — R413 Other amnesia: Secondary | ICD-10-CM | POA: Diagnosis not present

## 2014-11-03 MED ORDER — MEMANTINE HCL 10 MG PO TABS: 10.0000 mg | ORAL_TABLET | Freq: Two times a day (BID) | ORAL | Status: DC

## 2014-11-03 NOTE — Patient Instructions (Signed)
Back Pain, Adult Low back pain is very common. About 1 in 5 people have back pain.The cause of low back pain is rarely dangerous. The pain often gets better over time.About half of people with a sudden onset of back pain feel better in just 2 weeks. About 8 in 10 people feel better by 6 weeks.  CAUSES Some common causes of back pain include:  Strain of the muscles or ligaments supporting the spine.  Wear and tear (degeneration) of the spinal discs.  Arthritis.  Direct injury to the back. DIAGNOSIS Most of the time, the direct cause of low back pain is not known.However, back pain can be treated effectively even when the exact cause of the pain is unknown.Answering your caregiver's questions about your overall health and symptoms is one of the most accurate ways to make sure the cause of your pain is not dangerous. If your caregiver needs more information, he or she may order lab work or imaging tests (X-rays or MRIs).However, even if imaging tests show changes in your back, this usually does not require surgery. HOME CARE INSTRUCTIONS For many people, back pain returns.Since low back pain is rarely dangerous, it is often a condition that people can learn to manageon their own.   Remain active. It is stressful on the back to sit or stand in one place. Do not sit, drive, or stand in one place for more than 30 minutes at a time. Take short walks on level surfaces as soon as pain allows.Try to increase the length of time you walk each day.  Do not stay in bed.Resting more than 1 or 2 days can delay your recovery.  Do not avoid exercise or work.Your body is made to move.It is not dangerous to be active, even though your back may hurt.Your back will likely heal faster if you return to being active before your pain is gone.  Pay attention to your body when you bend and lift. Many people have less discomfortwhen lifting if they bend their knees, keep the load close to their bodies,and  avoid twisting. Often, the most comfortable positions are those that put less stress on your recovering back.  Find a comfortable position to sleep. Use a firm mattress and lie on your side with your knees slightly bent. If you lie on your back, put a pillow under your knees.  Only take over-the-counter or prescription medicines as directed by your caregiver. Over-the-counter medicines to reduce pain and inflammation are often the most helpful.Your caregiver may prescribe muscle relaxant drugs.These medicines help dull your pain so you can more quickly return to your normal activities and healthy exercise.  Put ice on the injured area.  Put ice in a plastic bag.  Place a towel between your skin and the bag.  Leave the ice on for 15-20 minutes, 03-04 times a day for the first 2 to 3 days. After that, ice and heat may be alternated to reduce pain and spasms.  Ask your caregiver about trying back exercises and gentle massage. This may be of some benefit.  Avoid feeling anxious or stressed.Stress increases muscle tension and can worsen back pain.It is important to recognize when you are anxious or stressed and learn ways to manage it.Exercise is a great option. SEEK MEDICAL CARE IF:  You have pain that is not relieved with rest or medicine.  You have pain that does not improve in 1 week.  You have new symptoms.  You are generally not feeling well. SEEK   IMMEDIATE MEDICAL CARE IF:   You have pain that radiates from your back into your legs.  You develop new bowel or bladder control problems.  You have unusual weakness or numbness in your arms or legs.  You develop nausea or vomiting.  You develop abdominal pain.  You feel faint. Document Released: 08/05/2005 Document Revised: 02/04/2012 Document Reviewed: 12/07/2013 [employer] Patient Information 2015 [employer]. This information is not intended to replace advice given to you by your health care provider. Make sure you  discuss any questions you have with your health care provider.  

## 2014-11-03 NOTE — Progress Notes (Signed)
Reason for visit: Memory disorder  [name] is an 69 y.o. male  History of present illness:  [name] is a 69 year old right-handed white male with a history of a mild memory disorder. Since last seen, he believes there has been some progression in the memory, he is having increasing problems using his cell phone and managing the remote on the TV. The patient is on Namenda as she could not tolerate Aricept or the Exelon patch. The patient is having a lot of problems with his low back pain, he has seen Dr. Hardin Negus previously. The patient has tried multiple modalities for the back pain without significant improvement. He has pain radiating into the hips on both sides. His ability to walk is limited somewhat by the low back pain, and he notes some problems with gait instability. He returns to this office for an evaluation. He continues to operate a motor vehicle. No significant safety issues have been noted with this.  Past Medical History  Diagnosis Date  . Nocturia   . Essential and other specified forms of tremor   . Hypersomnia with sleep apnea, unspecified   . Abdominal pain, right lower quadrant   . Acute prostatitis   . Spinal stenosis, lumbar region, without neurogenic claudication   . Fibromyalgia   . Urinary frequency   . Other testicular hypofunction   . Syncope and collapse   . Hypopotassemia   . Hemarthrosis, upper arm   . Unspecified adverse effect of unspecified drug, medicinal and biological substance   . Unspecified gastritis and gastroduodenitis without mention of hemorrhage   . Diverticulosis of colon (without mention of hemorrhage)   . Family history of malignant neoplasm of gastrointestinal tract   . Osteoporosis, unspecified   . Lumbago   . Ganglion and cyst of synovium, tendon, and bursa   . Hypertension   . GERD (gastroesophageal reflux disease)   . Hyperlipidemia   . Constipation   . Sleep apnea     last sleep study 11/11 on chart- Bipap with  settings of 4 per  patient  . Localized osteoarthrosis not specified whether primary or secondary, lower leg   . Degenerative disc disease   . Anxiety   . Depression   . Right inguinal hernia   . Memory difficulties 04/22/2013    Past Surgical History  Procedure Laterality Date  . Back surgery      x 3  . Microdisectomy  03/02/2003, 06/29/2003, 12/24/2005  . Hernia repair  1965, 1968  . Tonsillectomy    . Inguinal hernia repair  07/19/2011    Procedure: LAPAROSCOPIC INGUINAL HERNIA;  Surgeon: Odis Hollingshead, MD;  Location: [facility];  Service: General;  Laterality: Right;  Laparoscopic Repair of Recurrent Right  Ingunial Hernia with Mesh    Family History  Problem Relation Age of Onset  . Colon cancer Mother   . Dementia Mother   . Parkinsonism Father 13  . Liver disease Neg Hx   . Kidney disease Neg Hx   . Esophageal cancer Neg Hx   . Lung cancer Paternal Grandfather     smoker    Social history:  reports that he has never smoked. He has never used smokeless tobacco. He reports that he does not drink alcohol or use illicit drugs.    Allergies  Allergen Reactions  . Nucynta [Tapentadol] Other (See Comments)    dizziness  . Pregabalin     REACTION: dizziness and mental status changes  . Exelon [  Rivastigmine]     abd pain    Medications:  Prior to Admission medications   Medication Sig Start Date End Date Taking? Authorizing Provider  Calcium Carbonate-Vitamin D (CALCIUM 600 + D PO) Take 1 tablet by mouth 2 (two) times daily.    Yes Historical Provider, MD  cholecalciferol (VITAMIN D) 1000 UNITS tablet Take 5,000 Units by mouth daily.    Yes Historical Provider, MD  citalopram (CELEXA) 40 MG tablet TAKE 1 TABLET EVERY MORNING 05/09/14  Yes Eulas Post, MD  Coenzyme Q10 200 MG capsule Take 200 mg by mouth daily.    Yes Historical Provider, MD  Javier Docker Oil 1000 MG CAPS Take 1 capsule by mouth 2 (two) times daily.   Yes Historical Provider, MD  MAGNESIUM CITRATE PO Take  200 mg by mouth 2 (two) times daily.    Yes Historical Provider, MD  memantine (NAMENDA) 10 MG tablet Take 1 tablet (10 mg total) by mouth 2 (two) times daily. 05/26/14  Yes Kathrynn Ducking, MD  Nutritional Supplements (ANTI-OXIDANT COMPLEX PO) Take 10 mg by mouth daily.    Yes Historical Provider, MD  OVER THE COUNTER MEDICATION Take 1 scoop by mouth 2 (two) times daily. Raw fiber   Yes Historical Provider, MD  OVER THE COUNTER MEDICATION Place 1 application into both eyes 2 (two) times daily as needed. Dry eyes.  Refresh liquid gel   Yes Historical Provider, MD  potassium chloride SA (K-DUR,KLOR-CON) 20 MEQ tablet Take 1 tablet (20 mEq total) by mouth daily. 07/11/14  Yes Eulas Post, MD  Probiotic Product (ALIGN) 4 MG CAPS Take by mouth daily.    Yes Historical Provider, MD  psyllium (REGULOID) 0.52 G capsule Take 0.52 g by mouth 2 (two) times daily.    Yes Historical Provider, MD  Red Yeast Rice 600 MG CAPS Take by mouth 2 (two) times daily.    Yes Historical Provider, MD  TRIBENZOR 40-10-12.5 MG TABS TAKE 1 TABLET BY MOUTH DAILY. 05/02/14  Yes Eulas Post, MD  UNABLE TO FIND Tumeric 1 qd   Yes Historical Provider, MD  valACYclovir (VALTREX) 500 MG tablet Take 1 tablet (500 mg total) by mouth 3 (three) times daily. 11/22/13  Yes Eulas Post, MD  vitamin B-12 (CYANOCOBALAMIN) 50 MCG tablet Take 50 mcg by mouth daily.    Yes Historical Provider, MD    ROS:  Out of a complete 14 system review of symptoms, the patient complains only of the following symptoms, and all other reviewed systems are negative.  Constipation Frequent waking, daytime sleepiness, snoring Joint pain, back pain, walking difficulty Memory loss, dizziness, weakness  Blood pressure 136/73, pulse 57, height 5\' 8"  (1.727 m), weight 159 lb 12.8 oz (72.485 kg).  Physical Exam  General: The patient is alert and cooperative at the time of the examination.  Skin: No significant peripheral edema is  noted.   Neurologic Exam  Mental status: The patient is alert and oriented x 3 at the time of the examination. The patient has apparent normal recent and remote memory, with an apparently normal attention span and concentration ability. Mini-Mental Status Examination done today shows a total score of 30/30.   Cranial nerves: Facial symmetry is present. Speech is normal, no aphasia or dysarthria is noted. Extraocular movements are full. Visual fields are full.  Motor: The patient has good strength in all 4 extremities.  Sensory examination: Soft touch sensation is symmetric on the face, arms, and legs. Position sensation in  both feet is normal, no stocking pattern pinprick sensory deficit is noted.  Coordination: The patient has good finger-nose-finger and heel-to-shin bilaterally.  Gait and station: The patient has a normal gait. Tandem gait is normal. Romberg is negative. No drift is seen.  Reflexes: Deep tendon reflexes are symmetric.   Assessment/Plan:  1. Mild memory disorder  2. Chronic low back pain  3. Mild gait disturbance  The patient is doing well on the Namenda so far. He will continue the medication for now, a prescription was called in. He is having a lot of chronic low back pain issues, being followed by Dr. Nicholaus Bloom. The patient was given a form for handicapped parking for [facility]. He will follow-up in 6 months.  Jill Alexanders MD 11/03/2014 10:00 PM  [facility] [address] [place], [place] [address]  Phone [phone] Fax [phone]

## 2014-11-08 ENCOUNTER — Encounter: Payer: Self-pay | Admitting: Family Medicine

## 2014-11-08 ENCOUNTER — Ambulatory Visit (INDEPENDENT_AMBULATORY_CARE_PROVIDER_SITE_OTHER): Payer: PPO | Admitting: Family Medicine

## 2014-11-08 VITALS — BP 130/80 | HR 56 | Temp 98.0°F | Wt 161.0 lb

## 2014-11-08 DIAGNOSIS — R350 Frequency of micturition: Secondary | ICD-10-CM

## 2014-11-08 DIAGNOSIS — I10 Essential (primary) hypertension: Secondary | ICD-10-CM

## 2014-11-08 DIAGNOSIS — E876 Hypokalemia: Secondary | ICD-10-CM

## 2014-11-08 LAB — BASIC METABOLIC PANEL
BUN: 19 mg/dL (ref 6–23)
CHLORIDE: 104 meq/L (ref 96–112)
CO2: 34 meq/L — AB (ref 19–32)
CREATININE: 0.98 mg/dL (ref 0.40–1.50)
Calcium: 9.5 mg/dL (ref 8.4–10.5)
GFR: 80.41 mL/min (ref >60.00)
GLUCOSE: 103 mg/dL — AB (ref 70–99)
Potassium: 3.5 mEq/L (ref 3.5–5.1)
Sodium: 143 mEq/L (ref 135–145)

## 2014-11-08 MED ORDER — HYDROCHLOROTHIAZIDE 12.5 MG PO CAPS: 12.5000 mg | ORAL_CAPSULE | Freq: Every day | ORAL | Status: DC

## 2014-11-08 MED ORDER — LOSARTAN POTASSIUM 100 MG PO TABS: 100.0000 mg | ORAL_TABLET | Freq: Every day | ORAL | Status: DC

## 2014-11-08 MED ORDER — AMLODIPINE BESYLATE 10 MG PO TABS: 10.0000 mg | ORAL_TABLET | Freq: Every day | ORAL | Status: DC

## 2014-11-08 NOTE — Progress Notes (Signed)
Subjective:    Patient ID: [name], male    DOB: 22-Nov-1945, 69 y.o.   MRN: [id number]  HPI Patient seen for medical follow-up  Urine urgency. We tried Myrbetriq but he did not see improvement and stop this after about one month. His urine frequency but no obstructive urinary symptoms.  Hypertension. Currently takes Tribenzor but because of change of insurance has had tremendous jump and cost. Would like to look at generic alternatives to this. Blood pressure well controlled by home readings with mostly 130 over 70s. He states he has history of whitecoat syndrome here previously. No headaches. He has occasional dizziness but no recent falls. He has cognitive impairment treated with Namenda and followed by neurology.  History of recurrent hypokalemia. He takes potassium supplement regularly. No muscle cramps  Past Medical History  Diagnosis Date  . Nocturia   . Essential and other specified forms of tremor   . Hypersomnia with sleep apnea, unspecified   . Abdominal pain, right lower quadrant   . Acute prostatitis   . Spinal stenosis, lumbar region, without neurogenic claudication   . Fibromyalgia   . Urinary frequency   . Other testicular hypofunction   . Syncope and collapse   . Hypopotassemia   . Hemarthrosis, upper arm   . Unspecified adverse effect of unspecified drug, medicinal and biological substance   . Unspecified gastritis and gastroduodenitis without mention of hemorrhage   . Diverticulosis of colon (without mention of hemorrhage)   . Family history of malignant neoplasm of gastrointestinal tract   . Osteoporosis, unspecified   . Lumbago   . Ganglion and cyst of synovium, tendon, and bursa   . Hypertension   . GERD (gastroesophageal reflux disease)   . Hyperlipidemia   . Constipation   . Sleep apnea     last sleep study 11/11 on chart- Bipap with settings of 4 per  patient  . Localized osteoarthrosis not specified whether primary or secondary, lower leg   .  Degenerative disc disease   . Anxiety   . Depression   . Right inguinal hernia   . Memory difficulties 04/22/2013   Past Surgical History  Procedure Laterality Date  . Back surgery      x 3  . Microdisectomy  03/02/2003, 06/29/2003, 12/24/2005  . Hernia repair  1965, 1968  . Tonsillectomy    . Inguinal hernia repair  07/19/2011    Procedure: LAPAROSCOPIC INGUINAL HERNIA;  Surgeon: Odis Hollingshead, MD;  Location: [facility];  Service: General;  Laterality: Right;  Laparoscopic Repair of Recurrent Right  Ingunial Hernia with Mesh    reports that he has never smoked. He has never used smokeless tobacco. He reports that he does not drink alcohol or use illicit drugs. family history includes Colon cancer in his mother; Dementia in his mother; Lung cancer in his paternal grandfather; Parkinsonism (age of onset: 36) in his father. There is no history of Liver disease, Kidney disease, or Esophageal cancer. Allergies  Allergen Reactions  . Nucynta [Tapentadol] Other (See Comments)    dizziness  . Pregabalin     REACTION: dizziness and mental status changes  . Exelon [Rivastigmine]     abd pain      Review of Systems  Constitutional: Negative for fatigue and unexpected weight change.  Eyes: Negative for visual disturbance.  Respiratory: Negative for cough, chest tightness and shortness of breath.   Cardiovascular: Negative for chest pain, palpitations and leg swelling.  Endocrine: Negative for polydipsia and  polyuria.  Neurological: Negative for dizziness, syncope, weakness, light-headedness and headaches.       Objective:   Physical Exam  Constitutional: He is oriented to person, place, and time. He appears well-developed and well-nourished.  HENT:  Right Ear: External ear normal.  Left Ear: External ear normal.  Mouth/Throat: Oropharynx is clear and moist.  Eyes: Pupils are equal, round, and reactive to light.  Neck: Neck supple. No thyromegaly present.  Cardiovascular: Normal rate  and regular rhythm.   Pulmonary/Chest: Effort normal and breath sounds normal. No respiratory distress. He has no wheezes. He has no rales.  Musculoskeletal: He exhibits no edema.  Neurological: He is alert and oriented to person, place, and time.          Assessment & Plan:  #1 hypertension. Probable white coat syndrome. Well controlled by home readings. Will change to generic medications for cost issues as follows: Losartan 100 mg once daily, amlodipine 10 mg daily, and HCTZ 12.5 mg once daily. Reassess blood pressure 2 months #2 hypokalemia history. Recheck basic metabolic panel #3 urine urgency. Patient had questions regarding Flomax but he does not appear to have any major obstructive symptoms. Would not use anti-cholinergics because of his history of severe constipation off and on

## 2014-11-08 NOTE — Progress Notes (Signed)
Pre visit review using our clinic review tool, if applicable. No additional management support is needed unless otherwise documented below in the visit note. 

## 2015-01-12 ENCOUNTER — Ambulatory Visit (INDEPENDENT_AMBULATORY_CARE_PROVIDER_SITE_OTHER): Payer: PPO | Admitting: Family Medicine

## 2015-01-12 ENCOUNTER — Encounter: Payer: Self-pay | Admitting: Family Medicine

## 2015-01-12 VITALS — BP 120/66 | HR 60 | Temp 98.2°F | Wt 157.0 lb

## 2015-01-12 DIAGNOSIS — I10 Essential (primary) hypertension: Secondary | ICD-10-CM | POA: Diagnosis not present

## 2015-01-12 NOTE — Patient Instructions (Signed)
Potassium Content of Foods  Potassium is a mineral found in many foods and drinks. It helps keep fluids and minerals balanced in your body and affects how steadily your heart beats. Potassium also helps control your blood pressure and keep your muscles and nervous system healthy.  Certain health conditions and medicines may change the balance of potassium in your body. When this happens, you can help balance your level of potassium through the foods that you do or do not eat. Your health care provider or dietitian may recommend an amount of potassium that you should have each day. The following lists of foods provide the amount of potassium (in parentheses) per serving in each item.  HIGH IN POTASSIUM   The following foods and beverages have 200 mg or more of potassium per serving:  · Apricots, 2 raw or 5 dry (200 mg).  · Artichoke, 1 medium (345 mg).  · Avocado, raw,  ¼ each (245 mg).  · Banana, 1 medium (425 mg).  · Beans, lima, or baked beans, canned, ½ cup (280 mg).  · Beans, white, canned, ½ cup (595 mg).  · Beef roast, 3 oz (320 mg).  · Beef, ground, 3 oz (270 mg).  · Beets, raw or cooked, ½ cup (260 mg).  · Bran muffin, 2 oz (300 mg).  · Broccoli, ½ cup (230 mg).  · Brussels sprouts, ½ cup (250 mg).  · Cantaloupe, ½ cup (215 mg).  · Cereal, 100% bran, ½ cup (200-400 mg).  · Cheeseburger, single, fast food, 1 each (225-400 mg).  · Chicken, 3 oz (220 mg).  · Clams, canned, 3 oz (535 mg).  · Crab, 3 oz (225 mg).  · Dates, 5 each (270 mg).  · Dried beans and peas, ½ cup (300-475 mg).  · Figs, dried, 2 each (260 mg).  · Fish: halibut, tuna, cod, snapper, 3 oz (480 mg).  · Fish: salmon, haddock, swordfish, perch, 3 oz (300 mg).  · Fish, tuna, canned 3 oz (200 mg).  · French fries, fast food, 3 oz (470 mg).  · Granola with fruit and nuts, ½ cup (200 mg).  · Grapefruit juice, ½ cup (200 mg).  · Greens, beet, ½ cup (655 mg).  · Honeydew melon, ½ cup (200 mg).  · Kale, raw, 1 cup (300 mg).  · Kiwi, 1 medium (240  mg).  · Kohlrabi, rutabaga, parsnips, ½ cup (280 mg).  · Lentils, ½ cup (365 mg).  · Mango, 1 each (325 mg).  · Milk, chocolate, 1 cup (420 mg).  · Milk: nonfat, low-fat, whole, buttermilk, 1 cup (350-380 mg).  · Molasses, 1 Tbsp (295 mg).  · Mushrooms, ½ cup (280) mg.  · Nectarine, 1 each (275 mg).  · Nuts: almonds, peanuts, hazelnuts, [place], cashew, mixed, 1 oz (200 mg).  · Nuts, pistachios, 1 oz (295 mg).  · Orange, 1 each (240 mg).  · Orange juice, ½ cup (235 mg).  · Papaya, medium, ½ fruit (390 mg).  · Peanut butter, chunky, 2 Tbsp (240 mg).  · Peanut butter, smooth, 2 Tbsp (210 mg).  · Pear, 1 medium (200 mg).  · Pomegranate, 1 whole (400 mg).  · Pomegranate juice, ½ cup (215 mg).  · Pork, 3 oz (350 mg).  · Potato chips, salted, 1 oz (465 mg).  · Potato, baked with skin, 1 medium (925 mg).  · Potatoes, boiled, ½ cup (255 mg).  · Potatoes, mashed, ½ cup (330 mg).  · Prune juice, ½ cup (  370 mg).  · Prunes, 5 each (305 mg).  · Pudding, chocolate, ½ cup (230 mg).  · Pumpkin, canned, ½ cup (250 mg).  · Raisins, seedless, ¼ cup (270 mg).  · Seeds, sunflower or pumpkin, 1 oz (240 mg).  · Soy milk, 1 cup (300 mg).  · Spinach, ½ cup (420 mg).  · Spinach, canned, ½ cup (370 mg).  · Sweet potato, baked with skin, 1 medium (450 mg).  · Swiss chard, ½ cup (480 mg).  · Tomato or vegetable juice, ½ cup (275 mg).  · Tomato sauce or puree, ½ cup (400-550 mg).  · Tomato, raw, 1 medium (290 mg).  · Tomatoes, canned, ½ cup (200-300 mg).  · Turkey, 3 oz (250 mg).  · Wheat germ, 1 oz (250 mg).  · Winter squash, ½ cup (250 mg).  · Yogurt, plain or fruited, 6 oz (260-435 mg).  · Zucchini, ½ cup (220 mg).  MODERATE IN POTASSIUM  The following foods and beverages have 50-200 mg of potassium per serving:  · Apple, 1 each (150 mg).  · Apple juice, ½ cup (150 mg).  · Applesauce, ½ cup (90 mg).  · Apricot nectar, ½ cup (140 mg).  · Asparagus, small spears, ½ cup or 6 spears (155 mg).  · Bagel, cinnamon raisin, 1 each (130 mg).  · Bagel,  egg or plain, 4 in., 1 each (70 mg).  · Beans, green, ½ cup (90 mg).  · Beans, yellow, ½ cup (190 mg).  · Beer, regular, 12 oz (100 mg).  · Beets, canned, ½ cup (125 mg).  · Blackberries, ½ cup (115 mg).  · Blueberries, ½ cup (60 mg).  · Bread, whole wheat, 1 slice (70 mg).  · Broccoli, raw, ½ cup (145 mg).  · Cabbage, ½ cup (150 mg).  · Carrots, cooked or raw, ½ cup (180 mg).  · Cauliflower, raw, ½ cup (150 mg).  · Celery, raw, ½ cup (155 mg).  · Cereal, bran flakes, ½cup (120-150 mg).  · Cheese, cottage, ½ cup (110 mg).  · Cherries, 10 each (150 mg).  · Chocolate, 1½ oz bar (165 mg).  · Coffee, brewed 6 oz (90 mg).  · Corn, ½ cup or 1 ear (195 mg).  · Cucumbers, ½ cup (80 mg).  · Egg, large, 1 each (60 mg).  · Eggplant, ½ cup (60 mg).  · Endive, raw, ½cup (80 mg).  · English muffin, 1 each (65 mg).  · Fish, orange roughy, 3 oz (150 mg).  · Frankfurter, beef or pork, 1 each (75 mg).  · Fruit cocktail, ½ cup (115 mg).  · Grape juice, ½ cup (170 mg).  · Grapefruit, ½ fruit (175 mg).  · Grapes, ½ cup (155 mg).  · Greens: kale, turnip, collard, ½ cup (110-150 mg).  · Ice cream or frozen yogurt, chocolate, ½ cup (175 mg).  · Ice cream or frozen yogurt, vanilla, ½ cup (120-150 mg).  · Lemons, limes, 1 each (80 mg).  · Lettuce, all types, 1 cup (100 mg).  · Mixed vegetables, ½ cup (150 mg).  · Mushrooms, raw, ½ cup (110 mg).  · Nuts: walnuts, pecans, or macadamia, 1 oz (125 mg).  · Oatmeal, ½ cup (80 mg).  · Okra, ½ cup (110 mg).  · Onions, raw, ½ cup (120 mg).  · Peach, 1 each (185 mg).  · Peaches, canned, ½ cup (120 mg).  · Pears, canned, ½ cup (120 mg).  · Peas, green,   frozen, ½ cup (90 mg).  · Peppers, green, ½ cup (130 mg).  · Peppers, red, ½ cup (160 mg).  · Pineapple juice, ½ cup (165 mg).  · Pineapple, fresh or canned, ½ cup (100 mg).  · Plums, 1 each (105 mg).  · Pudding, vanilla, ½ cup (150 mg).  · Raspberries, ½ cup (90 mg).  · Rhubarb, ½ cup (115 mg).  · Rice, wild, ½ cup (80 mg).  · Shrimp, 3 oz (155  mg).  · Spinach, raw, 1 cup (170 mg).  · Strawberries, ½ cup (125 mg).  · Summer squash ½ cup (175-200 mg).  · Swiss chard, raw, 1 cup (135 mg).  · Tangerines, 1 each (140 mg).  · Tea, brewed, 6 oz (65 mg).  · Turnips, ½ cup (140 mg).  · Watermelon, ½ cup (85 mg).  · Wine, red, table, 5 oz (180 mg).  · Wine, white, table, 5 oz (100 mg).  LOW IN POTASSIUM  The following foods and beverages have less than 50 mg of potassium per serving.  · Bread, white, 1 slice (30 mg).  · Carbonated beverages, 12 oz (less than 5 mg).  · Cheese, 1 oz (20-30 mg).  · Cranberries, ½ cup (45 mg).  · Cranberry juice cocktail, ½ cup (20 mg).  · Fats and oils, 1 Tbsp (less than 5 mg).  · Hummus, 1 Tbsp (32 mg).  · Nectar: papaya, mango, or pear, ½ cup (35 mg).  · Rice, white or brown, ½ cup (50 mg).  · Spaghetti or macaroni, ½ cup cooked (30 mg).  · Tortilla, flour or corn, 1 each (50 mg).  · Waffle, 4 in., 1 each (50 mg).  · Water chestnuts, ½ cup (40 mg).  Document Released: 03/19/2005 Document Revised: 08/10/2013 Document Reviewed: 07/02/2013  Exit[employer]® Patient Information ©2015 [employer]. This information is not intended to replace advice given to you by your health care provider. Make sure you discuss any questions you have with your health care provider.

## 2015-01-12 NOTE — Progress Notes (Signed)
Subjective:    Patient ID: [name], male    DOB: [birth year], 69 y.o.   MRN: [id number]  HPI Follow-up hypertension. Last visit we had split his medications out to generics for cost issues. Blood pressures been stable. Exercises somewhat inconsistent. He has occasional lightheadedness. No recent falls. Compliant with medications. History of low potassium but improved with labs 2 months ago. No recent headaches or chest pains.  Past Medical History  Diagnosis Date  . Nocturia   . Essential and other specified forms of tremor   . Hypersomnia with sleep apnea, unspecified   . Abdominal pain, right lower quadrant   . Acute prostatitis   . Spinal stenosis, lumbar region, without neurogenic claudication   . Fibromyalgia   . Urinary frequency   . Other testicular hypofunction   . Syncope and collapse   . Hypopotassemia   . Hemarthrosis, upper arm   . Unspecified adverse effect of unspecified drug, medicinal and biological substance   . Unspecified gastritis and gastroduodenitis without mention of hemorrhage   . Diverticulosis of colon (without mention of hemorrhage)   . Family history of malignant neoplasm of gastrointestinal tract   . Osteoporosis, unspecified   . Lumbago   . Ganglion and cyst of synovium, tendon, and bursa   . Hypertension   . GERD (gastroesophageal reflux disease)   . Hyperlipidemia   . Constipation   . Sleep apnea     last sleep study 11/11 on chart- Bipap with settings of 4 per  patient  . Localized osteoarthrosis not specified whether primary or secondary, lower leg   . Degenerative disc disease   . Anxiety   . Depression   . Right inguinal hernia   . Memory difficulties 04/22/2013   Past Surgical History  Procedure Laterality Date  . Back surgery      x 3  . Microdisectomy  03/02/2003, 06/29/2003, 12/24/2005  . Hernia repair  1965, 1968  . Tonsillectomy    . Inguinal hernia repair  07/19/2011    Procedure: LAPAROSCOPIC INGUINAL HERNIA;  Surgeon: Odis Hollingshead, MD;  Location: [facility];  Service: General;  Laterality: Right;  Laparoscopic Repair of Recurrent Right  Ingunial Hernia with Mesh    reports that he has never smoked. He has never used smokeless tobacco. He reports that he does not drink alcohol or use illicit drugs. family history includes Colon cancer in his mother; Dementia in his mother; Lung cancer in his paternal grandfather; Parkinsonism (age of onset: 7) in his father. There is no history of Liver disease, Kidney disease, or Esophageal cancer. Allergies  Allergen Reactions  . Nucynta [Tapentadol] Other (See Comments)    dizziness  . Pregabalin     REACTION: dizziness and mental status changes  . Exelon [Rivastigmine]     abd pain      Review of Systems  Constitutional: Negative for appetite change, fatigue and unexpected weight change.  Eyes: Negative for visual disturbance.  Respiratory: Negative for cough, chest tightness and shortness of breath.   Cardiovascular: Negative for chest pain, palpitations and leg swelling.  Neurological: Positive for light-headedness. Negative for dizziness, syncope, weakness and headaches.       Objective:   Physical Exam  Constitutional: He appears well-developed and well-nourished. No distress.  Neck: No JVD present.  Cardiovascular: Normal rate and regular rhythm.   Pulmonary/Chest: Effort normal and breath sounds normal. No respiratory distress. He has no wheezes. He has no rales.  Musculoskeletal: He exhibits no  edema.          Assessment & Plan:  Hypertension. Stable and well controlled with current medications. Handout on high potassium diet given. Step up consistent with exercise. Reassess in 6 months and sooner as needed

## 2015-01-12 NOTE — Progress Notes (Signed)
Pre visit review using our clinic review tool, if applicable. No additional management support is needed unless otherwise documented below in the visit note. 

## 2015-01-20 ENCOUNTER — Other Ambulatory Visit: Payer: Self-pay | Admitting: Family Medicine

## 2015-03-28 ENCOUNTER — Ambulatory Visit (INDEPENDENT_AMBULATORY_CARE_PROVIDER_SITE_OTHER): Payer: PPO | Admitting: Family Medicine

## 2015-03-28 ENCOUNTER — Encounter: Payer: Self-pay | Admitting: Family Medicine

## 2015-03-28 VITALS — BP 142/80 | HR 64 | Temp 99.0°F | Wt 157.0 lb

## 2015-03-28 DIAGNOSIS — J069 Acute upper respiratory infection, unspecified: Secondary | ICD-10-CM | POA: Diagnosis not present

## 2015-03-28 MED ORDER — DOCUSATE SODIUM 100 MG PO CAPS: 100.0000 mg | ORAL_CAPSULE | Freq: Two times a day (BID) | ORAL | Status: DC

## 2015-03-28 MED ORDER — GUAIFENESIN ER 600 MG PO TB12: 1200.0000 mg | ORAL_TABLET | Freq: Two times a day (BID) | ORAL | Status: DC

## 2015-03-28 NOTE — Progress Notes (Signed)
PCP: Eulas Post, MD  Subjective:  [name] is a 69 y.o. year old very pleasant male patient who presents with Upper Respiratory infection  Cough started last Thursday, stable since it started. Denies nasal congestion. Seems to be more down in throat. Some soreness in chest with coughing. Cough is like a rattling, sparingly has coughed up some phlegm which is grayish or greenish and thick. No sinus pressure. Denies sick contacts. Cough keeps him up at night. Has not tried anything but cough drops. Most troublesome thing seems to be thickness of congestion and feeling like it gets stuck in throat.   ROS-denies fever, SOB, NV, tooth pain, sinus pressure. No watery itchy eyes or sneezing.   Past Medical History- essential tremor, sleep apnea, memory difficulties on namenda, hyperlipidemia, hypertension  Medications- reviewed  Current Outpatient Prescriptions  Medication Sig Dispense Refill  . amLODipine (NORVASC) 10 MG tablet Take 1 tablet (10 mg total) by mouth daily. 90 tablet 3  . Calcium Carbonate-Vitamin D (CALCIUM 600 + D PO) Take 1 tablet by mouth 2 (two) times daily.     . cholecalciferol (VITAMIN D) 1000 UNITS tablet Take 5,000 Units by mouth daily.     . citalopram (CELEXA) 40 MG tablet TAKE 1 TABLET EVERY MORNING 90 tablet 3  . Coenzyme Q10 200 MG capsule Take 200 mg by mouth daily.     . hydrochlorothiazide (MICROZIDE) 12.5 MG capsule Take 1 capsule (12.5 mg total) by mouth daily. 90 capsule 3  . KLOR-CON M20 20 MEQ tablet TAKE 1 TABLET (20 MEQ TOTAL) BY MOUTH DAILY. 30 tablet 5  . Krill Oil 1000 MG CAPS Take 1 capsule by mouth 2 (two) times daily.    Marland Kitchen losartan (COZAAR) 100 MG tablet Take 1 tablet (100 mg total) by mouth daily. 90 tablet 3  . MAGNESIUM CITRATE PO Take 200 mg by mouth 2 (two) times daily.     . memantine (NAMENDA) 10 MG tablet Take 1 tablet (10 mg total) by mouth 2 (two) times daily. 180 tablet 3  . Nutritional Supplements (ANTI-OXIDANT COMPLEX PO) Take  10 mg by mouth daily.     Marland Kitchen OVER THE COUNTER MEDICATION Take 1 scoop by mouth 2 (two) times daily. Raw fiber    . OVER THE COUNTER MEDICATION Place 1 application into both eyes 2 (two) times daily as needed. Dry eyes.  Refresh liquid gel    . Probiotic Product (ALIGN) 4 MG CAPS Take by mouth daily.     . psyllium (REGULOID) 0.52 G capsule Take 0.52 g by mouth 2 (two) times daily.     . Red Yeast Rice 600 MG CAPS Take by mouth 2 (two) times daily.     Marland Kitchen UNABLE TO FIND Tumeric 1 qd    . valACYclovir (VALTREX) 500 MG tablet Take 1 tablet (500 mg total) by mouth 3 (three) times daily. 30 tablet 1  . vitamin B-12 (CYANOCOBALAMIN) 50 MCG tablet Take 50 mcg by mouth daily.     . [DISCONTINUED] potassium chloride (K-DUR) 10 MEQ tablet Take 1 tablet (10 mEq total) by mouth daily. 1 twice a day for 3 days and then 1 every dat 90 tablet 3   Objective: BP 142/80 mmHg  Pulse 64  Temp(Src) 99 F (37.2 C)  Wt 157 lb (71.215 kg)  SpO2 99% Gen: NAD, resting comfortably HEENT: Turbinates erythematous with no drainage, TM normal, pharynx mildly erythematous with no tonsilar exudate or edema, no sinus tenderness, no lymphadenopathy CV: RRR no  murmurs rubs or gallops Lungs: CTAB no crackles, wheeze, rhonchi Ext: no edema Skin: warm, dry, no rash Neuro: grossly normal, moves all extremities  Assessment/Plan:  Upper Respiratory infection History and exam today are suggestive of viral URI.  We discussed that a serious infection or illness is unlikely. We also discussed other potential etiologies none suggestive of bacterial infection at this time. We discussed treatment side effects, likely course, antibiotic misuse, transmission, and signs of developing a serious illness.  For thickness of secretions- advised high dose mucinex to help loosen.  History of constipation since getting off opiates- had used wife's colace and helped. Asks for rx and provided.   Finally, we reviewed reasons to return to care  including if symptoms worsen or persist or new concerns arise.  Meds ordered this encounter  Medications  . docusate sodium (COLACE) 100 MG capsule    Sig: Take 1 capsule (100 mg total) by mouth 2 (two) times daily.    Dispense:  60 capsule    Refill:  1  . guaiFENesin (MUCINEX) 600 MG 12 hr tablet    Sig: Take 2 tablets (1,200 mg total) by mouth 2 (two) times daily.    Dispense:  30 tablet    Refill:  0

## 2015-03-28 NOTE — Patient Instructions (Signed)
Medication Instructions:  Sent in mucinex generic version- take 2 twice a day to loosen secretions Sent in colace- they may just point you over the counter  Other Instructions:  This is an upper respiratory infection. They tend to last 1-2 weeks. If you have fever, chills, worsening cough, shortness of breath, return to see [place] or if symptoms last longer than 2 weeks.

## 2015-04-04 ENCOUNTER — Telehealth: Payer: Self-pay | Admitting: Family Medicine

## 2015-04-04 NOTE — Telephone Encounter (Signed)
Patient Name: [name] DOB: [birth year] Initial Comment Caller states husband saw MD Hunter last Tuesday for cough, dx viral cough, told to come back in 2 wks. Since found out that 70 yr old granddaughter has whooping cough and he was exposed to her. Last saw her 7/22. Nurse Assessment Nurse: Ronnald Ramp, RN, Miranda Date/Time (Eastern Time): 04/04/2015 10:33:42 AM Confirm and document reason for call. If symptomatic, describe symptoms. ---Spoke with pt and states he has had productive cough for 1 week. He was seen last week and diagnosed with a virus and prescribed Mucinex. He is concerned because his granddaughter was diagnosed with Whooping cough. Denies fever. Has the patient traveled out of the country within the last 30 days? ---Not Applicable Does the patient require triage? ---Yes Related visit to physician within the last 2 weeks? ---Yes Does the PT have any chronic conditions? (i.e. diabetes, asthma, etc.) ---Yes List chronic conditions. ---Dtap vaccine in 2015, No history of asthma or use an inhaler. Guidelines Guideline Title Affirmed Question Affirmed Notes Cough - Acute Productive SEVERE coughing spells (e.g., whooping sound after coughing, vomiting after coughing) Final Disposition User See Physician within 24 Hours Ronnald Ramp, RN, Miranda Comments Appt schedule for tomorrow 8/17 at 1:45p with Dr. Elease Hashimoto Referrals REFERRED TO PCP OFFICE Disagree/Comply: Comply

## 2015-04-05 ENCOUNTER — Ambulatory Visit (INDEPENDENT_AMBULATORY_CARE_PROVIDER_SITE_OTHER): Payer: PPO | Admitting: Family Medicine

## 2015-04-05 ENCOUNTER — Encounter: Payer: Self-pay | Admitting: Family Medicine

## 2015-04-05 VITALS — BP 126/74 | HR 62 | Temp 98.3°F | Wt 156.0 lb

## 2015-04-05 DIAGNOSIS — R05 Cough: Secondary | ICD-10-CM | POA: Diagnosis not present

## 2015-04-05 DIAGNOSIS — R059 Cough, unspecified: Secondary | ICD-10-CM

## 2015-04-05 NOTE — Progress Notes (Signed)
Pre visit review using our clinic review tool, if applicable. No additional management support is needed unless otherwise documented below in the visit note. 

## 2015-04-05 NOTE — Progress Notes (Signed)
Subjective:    Patient ID: [name], male    DOB: 10/03/1945, 68 y.o.   MRN: [id number]  HPI  patient recently seen for cough. Approximately 2 weeks duration. Is nonsmoker. Has not had any fever or chills. No dyspnea. Suspected viral process. He states his cough is actually slightly better today and finally becoming productive. His granddaughter was diagnosed with  Bordetella pertussis infection but apparently he was around her back in July prior to her suspected exposure. He has not had any wheezing. No hemoptysis. He does not describe any paroxysmal severe cough  Past Medical History  Diagnosis Date  . Nocturia   . Essential and other specified forms of tremor   . Hypersomnia with sleep apnea, unspecified   . Abdominal pain, right lower quadrant   . Acute prostatitis   . Spinal stenosis, lumbar region, without neurogenic claudication   . Fibromyalgia   . Urinary frequency   . Other testicular hypofunction   . Syncope and collapse   . Hypopotassemia   . Hemarthrosis, upper arm   . Unspecified adverse effect of unspecified drug, medicinal and biological substance   . Unspecified gastritis and gastroduodenitis without mention of hemorrhage   . Diverticulosis of colon (without mention of hemorrhage)   . Family history of malignant neoplasm of gastrointestinal tract   . Osteoporosis, unspecified   . Lumbago   . Ganglion and cyst of synovium, tendon, and bursa   . Hypertension   . GERD (gastroesophageal reflux disease)   . Hyperlipidemia   . Constipation   . Sleep apnea     last sleep study 11/11 on chart- Bipap with settings of 4 per  patient  . Localized osteoarthrosis not specified whether primary or secondary, lower leg   . Degenerative disc disease   . Anxiety   . Depression   . Right inguinal hernia   . Memory difficulties 04/22/2013   Past Surgical History  Procedure Laterality Date  . Back surgery      x 3  . Microdisectomy  03/02/2003, 06/29/2003, 12/24/2005  .  Hernia repair  1965, 1968  . Tonsillectomy    . Inguinal hernia repair  07/19/2011    Procedure: LAPAROSCOPIC INGUINAL HERNIA;  Surgeon: Odis Hollingshead, MD;  Location: [facility];  Service: General;  Laterality: Right;  Laparoscopic Repair of Recurrent Right  Ingunial Hernia with Mesh    reports that he has never smoked. He has never used smokeless tobacco. He reports that he does not drink alcohol or use illicit drugs. family history includes Colon cancer in his mother; Dementia in his mother; Lung cancer in his paternal grandfather; Parkinsonism (age of onset: 92) in his father. There is no history of Liver disease, Kidney disease, or Esophageal cancer. Allergies  Allergen Reactions  . Nucynta [Tapentadol] Other (See Comments)    dizziness  . Pregabalin     REACTION: dizziness and mental status changes  . Exelon [Rivastigmine]     abd pain      Review of Systems  Constitutional: Negative for fever, chills, appetite change and unexpected weight change.  Respiratory: Positive for cough. Negative for shortness of breath and wheezing.   Cardiovascular: Negative for chest pain.       Objective:   Physical Exam  Constitutional: He appears well-developed and well-nourished.  Cardiovascular: Normal rate and regular rhythm.   Pulmonary/Chest: Effort normal and breath sounds normal. No respiratory distress. He has no wheezes. He has no rales.  Musculoskeletal: He exhibits no  edema.          Assessment & Plan:   Cough. Suspect resolving acute bronchial infection- most likely viral. Non-focal exam. He feels better today. We recommend continue Mucinex and adequate hydration. Call immediately for any fever or if cough not improving over the next week

## 2015-04-05 NOTE — Patient Instructions (Signed)
Follow up promptly for any fever or increased shortness of breath Continue with the Mucinex I suspect you have resolving acute bronchitis.

## 2015-04-11 ENCOUNTER — Other Ambulatory Visit: Payer: Self-pay | Admitting: Family Medicine

## 2015-04-11 ENCOUNTER — Telehealth: Payer: Self-pay | Admitting: Family Medicine

## 2015-04-11 ENCOUNTER — Ambulatory Visit (INDEPENDENT_AMBULATORY_CARE_PROVIDER_SITE_OTHER)
Admission: RE | Admit: 2015-04-11 | Discharge: 2015-04-11 | Disposition: A | Discharge status: Home / Self Care | Payer: PPO | Source: Ambulatory Visit | Attending: Family Medicine | Admitting: Family Medicine

## 2015-04-11 DIAGNOSIS — R05 Cough: Secondary | ICD-10-CM

## 2015-04-11 DIAGNOSIS — R059 Cough, unspecified: Secondary | ICD-10-CM

## 2015-04-11 NOTE — Telephone Encounter (Signed)
Go ahead and get CXR.

## 2015-04-11 NOTE — Telephone Encounter (Signed)
Pt is informed.

## 2015-04-11 NOTE — Telephone Encounter (Signed)
Wife states pt is not any better w/ his cough. She states dr Elease Hashimoto mentioned an xray if it lasted  Longer than 16  Days. This is the 16th day. Is it ok to order the xray asap?

## 2015-04-12 ENCOUNTER — Encounter: Payer: Self-pay | Admitting: Family Medicine

## 2015-04-12 ENCOUNTER — Ambulatory Visit (INDEPENDENT_AMBULATORY_CARE_PROVIDER_SITE_OTHER): Payer: PPO | Admitting: Family Medicine

## 2015-04-12 VITALS — BP 120/80 | HR 86

## 2015-04-12 DIAGNOSIS — R05 Cough: Secondary | ICD-10-CM

## 2015-04-12 DIAGNOSIS — Z2089 Contact with and (suspected) exposure to other communicable diseases: Secondary | ICD-10-CM

## 2015-04-12 DIAGNOSIS — R059 Cough, unspecified: Secondary | ICD-10-CM

## 2015-04-12 DIAGNOSIS — Z20818 Contact with and (suspected) exposure to other bacterial communicable diseases: Secondary | ICD-10-CM

## 2015-04-12 NOTE — Progress Notes (Signed)
Subjective:    Patient ID: [name], male    DOB: Sep 13, 1945, 69 y.o.   MRN: [id number]  HPI Patient seen with some persistent cough. Granddaughter diagnosed with "whooping" cough. When he was seen last week he states that his exposure to her was not during contagious period. But apparently they had gotten their dates wrong. He has intermittent cough which sometimes is fairly severe. No fever. Recent chest x-ray no acute abnormalities. Denies any nasal congestion. No dyspnea. No respiratory distress.  Past Medical History  Diagnosis Date  . Nocturia   . Essential and other specified forms of tremor   . Hypersomnia with sleep apnea, unspecified   . Abdominal pain, right lower quadrant   . Acute prostatitis   . Spinal stenosis, lumbar region, without neurogenic claudication   . Fibromyalgia   . Urinary frequency   . Other testicular hypofunction   . Syncope and collapse   . Hypopotassemia   . Hemarthrosis, upper arm   . Unspecified adverse effect of unspecified drug, medicinal and biological substance   . Unspecified gastritis and gastroduodenitis without mention of hemorrhage   . Diverticulosis of colon (without mention of hemorrhage)   . Family history of malignant neoplasm of gastrointestinal tract   . Osteoporosis, unspecified   . Lumbago   . Ganglion and cyst of synovium, tendon, and bursa   . Hypertension   . GERD (gastroesophageal reflux disease)   . Hyperlipidemia   . Constipation   . Sleep apnea     last sleep study 11/11 on chart- Bipap with settings of 4 per  patient  . Localized osteoarthrosis not specified whether primary or secondary, lower leg   . Degenerative disc disease   . Anxiety   . Depression   . Right inguinal hernia   . Memory difficulties 04/22/2013   Past Surgical History  Procedure Laterality Date  . Back surgery      x 3  . Microdisectomy  03/02/2003, 06/29/2003, 12/24/2005  . Hernia repair  1965, 1968  . Tonsillectomy    . Inguinal hernia  repair  07/19/2011    Procedure: LAPAROSCOPIC INGUINAL HERNIA;  Surgeon: Odis Hollingshead, MD;  Location: [facility];  Service: General;  Laterality: Right;  Laparoscopic Repair of Recurrent Right  Ingunial Hernia with Mesh    reports that he has never smoked. He has never used smokeless tobacco. He reports that he does not drink alcohol or use illicit drugs. family history includes Colon cancer in his mother; Dementia in his mother; Lung cancer in his paternal grandfather; Parkinsonism (age of onset: 37) in his father. There is no history of Liver disease, Kidney disease, or Esophageal cancer. Allergies  Allergen Reactions  . Nucynta [Tapentadol] Other (See Comments)    dizziness  . Pregabalin     REACTION: dizziness and mental status changes  . Exelon [Rivastigmine]     abd pain      Review of Systems  Constitutional: Negative for fever and chills.  Respiratory: Positive for cough. Negative for shortness of breath and wheezing.   Cardiovascular: Negative for chest pain.  Neurological: Negative for dizziness and headaches.       Objective:   Physical Exam  Constitutional: He appears well-developed and well-nourished.  Neck: Neck supple. No JVD present. No thyromegaly present.  Cardiovascular: Normal rate and regular rhythm.   Pulmonary/Chest: Effort normal and breath sounds normal. No respiratory distress. He has no wheezes. He has no rales.  Musculoskeletal: He exhibits no edema.  Assessment & Plan:  Cough. Possible exposure to Bordetella pertussis. Cover with Zithromax for 5 days. Check pertussis screen with nasopharyngeal swab.  Preventative measures discussed. He is not in any respiratory distress

## 2015-04-12 NOTE — Progress Notes (Signed)
Pre visit review using our clinic review tool, if applicable. No additional management support is needed unless otherwise documented below in the visit note. 

## 2015-04-13 ENCOUNTER — Ambulatory Visit (INDEPENDENT_AMBULATORY_CARE_PROVIDER_SITE_OTHER): Payer: PPO

## 2015-04-13 ENCOUNTER — Other Ambulatory Visit: Payer: Self-pay | Admitting: Family Medicine

## 2015-04-13 ENCOUNTER — Other Ambulatory Visit: Payer: PPO

## 2015-04-13 ENCOUNTER — Telehealth: Payer: Self-pay

## 2015-04-13 DIAGNOSIS — Z20818 Contact with and (suspected) exposure to other bacterial communicable diseases: Secondary | ICD-10-CM

## 2015-04-13 DIAGNOSIS — Z2089 Contact with and (suspected) exposure to other communicable diseases: Secondary | ICD-10-CM

## 2015-04-13 LAB — BORDETELLA, RAPID TEST: BORDETELLA, RAPID: NEGATIVE

## 2015-04-13 NOTE — Telephone Encounter (Signed)
Patient came to this office, sent from [place] office on 8/24 at 1645 to collect rapid poct pertussis test, sample was taken from [facility] and sent to [facility]---per karen/lab, test results will prob not result before noon on 8/25---patient's cell phone number is [phone]

## 2015-04-13 NOTE — Telephone Encounter (Signed)
Patient came in for 2nd visit to gather another sample for pertussis testing, 1st sample was not able to be resulted---sample to lab at 12:20 8/25 and given to karen/lab---patients cell phone number is [phone], this is also number for patient's wife, Rodena Piety.

## 2015-04-27 ENCOUNTER — Other Ambulatory Visit: Payer: Self-pay | Admitting: Family Medicine

## 2015-05-08 ENCOUNTER — Telehealth: Payer: Self-pay | Admitting: Family Medicine

## 2015-05-08 NOTE — Telephone Encounter (Signed)
Wife calling to say that patients cough is present. Causing him to gasp for air and become choked. Has been on a round of antibiotic but is still having a cough that won't relent. Please call to advise.

## 2015-05-08 NOTE — Telephone Encounter (Signed)
Wife called back to fu on request. Aware med to be sent in [facility]/ [address]

## 2015-05-08 NOTE — Telephone Encounter (Signed)
Let's try Tessalon perles 200 mg one po q 8 hours prn cough #30  Let me know if cough not improving over the next week.

## 2015-05-09 MED ORDER — BENZONATATE 100 MG PO CAPS: 100.0000 mg | ORAL_CAPSULE | Freq: Three times a day (TID) | ORAL | Status: DC | PRN

## 2015-05-11 ENCOUNTER — Ambulatory Visit (INDEPENDENT_AMBULATORY_CARE_PROVIDER_SITE_OTHER): Payer: PPO | Admitting: Adult Health

## 2015-05-11 ENCOUNTER — Encounter: Payer: Self-pay | Admitting: Adult Health

## 2015-05-11 VITALS — BP 121/61 | HR 58 | Ht 68.0 in | Wt 154.0 lb

## 2015-05-11 DIAGNOSIS — G4733 Obstructive sleep apnea (adult) (pediatric): Secondary | ICD-10-CM

## 2015-05-11 DIAGNOSIS — R413 Other amnesia: Secondary | ICD-10-CM

## 2015-05-11 NOTE — Patient Instructions (Signed)
Memory is stable Continue Namenda If your symptoms worsen or you develop new symptoms please let us know.

## 2015-05-11 NOTE — Progress Notes (Signed)
I have read the note, and I agree with the clinical assessment and plan.  WILLIS,CHARLES KEITH   

## 2015-05-11 NOTE — Progress Notes (Signed)
PATIENT: [name] DOB: 1946/03/10  REASON FOR VISIT: follow up- memory HISTORY FROM: patient  HISTORY OF PRESENT ILLNESS: [name] is a 69 year old male with a history of mild memory disorder. He returns today for follow-up. The patient is currently on Namenda. He is tolerating this medication well. Patient feels that his memory may have gotten slightly worse. He states that he tends to forget things as soon as he is told. He continues to have chronic back pain. He is followed by Dr. Hardin Negus however they have exhausted their efforts in trying to control his pain. He is able to complete all ADLs independently. He operates a Teacher, music without difficulty. Denies having to give up anything due to his memory. He is able to complete his finances without difficulty. Denies any new medical issues. He returns today for an evaluation.  HISTORY 11/03/14:  [name] is a 69 year old right-handed white male with a history of a mild memory disorder. Since last seen, he believes there has been some progression in the memory, he is having increasing problems using his cell phone and managing the remote on the TV. The patient is on Namenda as she could not tolerate Aricept or the Exelon patch. The patient is having a lot of problems with his low back pain, he has seen Dr. Hardin Negus previously. The patient has tried multiple modalities for the back pain without significant improvement. He has pain radiating into the hips on both sides. His ability to walk is limited somewhat by the low back pain, and he notes some problems with gait instability. He returns to this office for an evaluation. He continues to operate a motor vehicle. No significant safety issues have been noted with this.  REVIEW OF SYSTEMS: Out of a complete 14 system review of symptoms, the patient complains only of the following symptoms, and all other reviewed systems are negative.  ALLERGIES: Allergies  Allergen Reactions  . Nucynta  [Tapentadol] Other (See Comments)    dizziness  . Pregabalin     REACTION: dizziness and mental status changes  . Exelon [Rivastigmine]     abd pain    HOME MEDICATIONS: Outpatient Prescriptions Prior to Visit  Medication Sig Dispense Refill  . amLODipine (NORVASC) 10 MG tablet Take 1 tablet (10 mg total) by mouth daily. 90 tablet 3  . benzonatate (TESSALON) 100 MG capsule Take 1 capsule (100 mg total) by mouth every 8 (eight) hours as needed for cough. 30 capsule 0  . Calcium Carbonate-Vitamin D (CALCIUM 600 + D PO) Take 1 tablet by mouth 2 (two) times daily.     . cholecalciferol (VITAMIN D) 1000 UNITS tablet Take 5,000 Units by mouth daily.     . citalopram (CELEXA) 40 MG tablet TAKE 1 TABLET EVERY MORNING 90 tablet 3  . Coenzyme Q10 200 MG capsule Take 200 mg by mouth daily.     Marland Kitchen docusate sodium (COLACE) 100 MG capsule Take 1 capsule (100 mg total) by mouth 2 (two) times daily. 60 capsule 1  . guaiFENesin (MUCINEX) 600 MG 12 hr tablet Take 2 tablets (1,200 mg total) by mouth 2 (two) times daily. 30 tablet 0  . hydrochlorothiazide (MICROZIDE) 12.5 MG capsule Take 1 capsule (12.5 mg total) by mouth daily. 90 capsule 3  . KLOR-CON M20 20 MEQ tablet TAKE 1 TABLET (20 MEQ TOTAL) BY MOUTH DAILY. 30 tablet 5  . Krill Oil 1000 MG CAPS Take 1 capsule by mouth 2 (two) times daily.    Marland Kitchen  losartan (COZAAR) 100 MG tablet Take 1 tablet (100 mg total) by mouth daily. 90 tablet 3  . MAGNESIUM CITRATE PO Take 200 mg by mouth 2 (two) times daily.     . memantine (NAMENDA) 10 MG tablet Take 1 tablet (10 mg total) by mouth 2 (two) times daily. 180 tablet 3  . Nutritional Supplements (ANTI-OXIDANT COMPLEX PO) Take 10 mg by mouth daily.     Marland Kitchen OVER THE COUNTER MEDICATION Take 1 scoop by mouth 2 (two) times daily. Raw fiber    . OVER THE COUNTER MEDICATION Place 1 application into both eyes 2 (two) times daily as needed. Dry eyes.  Refresh liquid gel    . Probiotic Product (ALIGN) 4 MG CAPS Take by mouth  daily.     . psyllium (REGULOID) 0.52 G capsule Take 0.52 g by mouth 2 (two) times daily.     . Red Yeast Rice 600 MG CAPS Take by mouth 2 (two) times daily.     Marland Kitchen UNABLE TO FIND Tumeric 1 qd    . valACYclovir (VALTREX) 500 MG tablet Take 1 tablet (500 mg total) by mouth 3 (three) times daily. 30 tablet 1  . vitamin B-12 (CYANOCOBALAMIN) 50 MCG tablet Take 50 mcg by mouth daily.      No facility-administered medications prior to visit.    PAST MEDICAL HISTORY: Past Medical History  Diagnosis Date  . Nocturia   . Essential and other specified forms of tremor   . Hypersomnia with sleep apnea, unspecified   . Abdominal pain, right lower quadrant   . Acute prostatitis   . Spinal stenosis, lumbar region, without neurogenic claudication   . Fibromyalgia   . Urinary frequency   . Other testicular hypofunction   . Syncope and collapse   . Hypopotassemia   . Hemarthrosis, upper arm   . Unspecified adverse effect of unspecified drug, medicinal and biological substance   . Unspecified gastritis and gastroduodenitis without mention of hemorrhage   . Diverticulosis of colon (without mention of hemorrhage)   . Family history of malignant neoplasm of gastrointestinal tract   . Osteoporosis, unspecified   . Lumbago   . Ganglion and cyst of synovium, tendon, and bursa   . Hypertension   . GERD (gastroesophageal reflux disease)   . Hyperlipidemia   . Constipation   . Sleep apnea     last sleep study 11/11 on chart- Bipap with settings of 4 per  patient  . Localized osteoarthrosis not specified whether primary or secondary, lower leg   . Degenerative disc disease   . Anxiety   . Depression   . Right inguinal hernia   . Memory difficulties 04/22/2013    PAST SURGICAL HISTORY: Past Surgical History  Procedure Laterality Date  . Back surgery      x 3  . Microdisectomy  03/02/2003, 06/29/2003, 12/24/2005  . Hernia repair  1965, 1968  . Tonsillectomy    . Inguinal hernia repair  07/19/2011      Procedure: LAPAROSCOPIC INGUINAL HERNIA;  Surgeon: Odis Hollingshead, MD;  Location: [facility];  Service: General;  Laterality: Right;  Laparoscopic Repair of Recurrent Right  Ingunial Hernia with Mesh    FAMILY HISTORY: Family History  Problem Relation Age of Onset  . Colon cancer Mother   . Dementia Mother   . Parkinsonism Father 79  . Liver disease Neg Hx   . Kidney disease Neg Hx   . Esophageal cancer Neg Hx   . Lung cancer Paternal  Grandfather     smoker    SOCIAL HISTORY: Social History   Social History  . Marital Status: Married    Spouse Name: N/A  . Number of Children: 2  . Years of Education: college   Occupational History  . retired    Social History Main Topics  . Smoking status: Never Smoker   . Smokeless tobacco: Never Used  . Alcohol Use: No     Comment: less than 5 per week  . Drug Use: No  . Sexual Activity: Yes   Other Topics Concern  . Not on file   Social History Narrative   Patient is right handed.   Patient drinks 1 cup of caffeine per day.      PHYSICAL EXAM  Filed Vitals:   05/11/15 0922  BP: 121/61  Pulse: 58  Height: 5\' 8"  (1.727 m)  Weight: 154 lb (69.854 kg)   Body mass index is 23.42 kg/(m^2).  Generalized: Well developed, in no acute distress   Neurological examination  Mentation: Alert oriented to time, place, history taking. Follows all commands speech and language fluent Cranial nerve II-XII: Pupils were equal round reactive to light. Extraocular movements were full, visual field were full on confrontational test. Facial sensation and strength were normal. Uvula tongue midline. Head turning and shoulder shrug  were normal and symmetric. Motor: The motor testing reveals 5 over 5 strength of all 4 extremities. Good symmetric motor tone is noted throughout.  Sensory: Sensory testing is intact to soft touch on all 4 extremities. No evidence of extinction is noted.  Coordination: Cerebellar testing reveals good  finger-nose-finger and heel-to-shin bilaterally.  Gait and station: Gait is normal. Tandem gait is normal. Romberg is negative. No drift is seen.  Reflexes: Deep tendon reflexes are symmetric and normal bilaterally.   DIAGNOSTIC DATA (LABS, IMAGING, TESTING) - I reviewed patient records, labs, notes, testing and imaging myself where available.  Lab Results  Component Value Date   WBC 5.8 03/02/2014   HGB 13.9 03/02/2014   HCT 40.6 03/02/2014   MCV 94.4 03/02/2014   PLT 179.0 03/02/2014      Component Value Date/Time   NA 143 11/08/2014 0918   K 3.5 11/08/2014 0918   CL 104 11/08/2014 0918   CO2 34* 11/08/2014 0918   GLUCOSE 103* 11/08/2014 0918   BUN 19 11/08/2014 0918   CREATININE 0.98 11/08/2014 0918   CALCIUM 9.5 11/08/2014 0918   CALCIUM 8.9 09/14/2009 2150   PROT 7.0 03/02/2014 0947   ALBUMIN 4.2 03/02/2014 0947   AST 21 03/02/2014 0947   ALT 16 03/02/2014 0947   ALKPHOS 77 03/02/2014 0947   BILITOT 0.9 03/02/2014 0947   GFRNONAA >90 07/16/2011 1100   GFRAA >90 07/16/2011 1100   Lab Results  Component Value Date   CHOL 203* 03/02/2014   HDL 76.90 03/02/2014   LDLCALC 118* 03/02/2014   LDLDIRECT 132.9 11/26/2007   TRIG 41.0 03/02/2014   CHOLHDL 3 03/02/2014   Lab Results  Component Value Date   HGBA1C 5.9 01/23/2010   Lab Results  Component Value Date   VITAMINB12 689 04/14/2012   Lab Results  Component Value Date   TSH 1.76 03/02/2014      ASSESSMENT AND PLAN 69 y.o. year old male  has a past medical history of Nocturia; Essential and other specified forms of tremor; Hypersomnia with sleep apnea, unspecified; Abdominal pain, right lower quadrant; Acute prostatitis; Spinal stenosis, lumbar region, without neurogenic claudication; Fibromyalgia; Urinary frequency; Other testicular  hypofunction; Syncope and collapse; Hypopotassemia; Hemarthrosis, upper arm; Unspecified adverse effect of unspecified drug, medicinal and biological substance; Unspecified  gastritis and gastroduodenitis without mention of hemorrhage; Diverticulosis of colon (without mention of hemorrhage); Family history of malignant neoplasm of gastrointestinal tract; Osteoporosis, unspecified; Lumbago; Ganglion and cyst of synovium, tendon, and bursa; Hypertension; GERD (gastroesophageal reflux disease); Hyperlipidemia; Constipation; Sleep apnea; Localized osteoarthrosis not specified whether primary or secondary, lower leg; Degenerative disc disease; Anxiety; Depression; Right inguinal hernia; and Memory difficulties (04/22/2013). here with:  1. Memory disturbance 2. OSA  Overall the patient is doing well. His MMSE today is was previously 30/30. He will continue on Namenda. The patient does have a history of obstructive sleep apnea. I explained that untreated sleep apnea could have an effect on his memory. I do feel that his chronic pain also affects his memory. Patient and his wife feel that he may need to try the CPAP again. He was originally managed by [facility] pulmonology however they would like our office to take this over. They will have his last sleep study faxed over to our office. I will consult with one of our sleep doctors once I received this. Patient advised that if his symptoms worsen or he develops any new symptoms he should let us know. He will follow-up in 6 months or sooner if needed.  I spent 25 minutes with the patient 50% of this time was spent counseling the patient on his diagnosis as well as the risk and benefits of treating his sleep apnea.   Ward Givens, MSN, NP-C 05/11/2015, 9:29 AM [facility] [address], [place], [place] [address] [phone]

## 2015-05-18 ENCOUNTER — Telehealth: Payer: Self-pay | Admitting: Family Medicine

## 2015-05-18 DIAGNOSIS — M549 Dorsalgia, unspecified: Secondary | ICD-10-CM

## 2015-05-18 NOTE — Telephone Encounter (Signed)
Pt call to ask for a referral for  physcial therapy to the following  ;;   [place]   Reason;  Back issues

## 2015-05-21 NOTE — Telephone Encounter (Signed)
We can refer, though we have not seen him recently for back issues and sometimes insurance will decline referral if we have not assessed him first.

## 2015-05-22 NOTE — Telephone Encounter (Signed)
Shelly said billing department said ok to send the  Referral over. Dr Elease Hashimoto please put order in

## 2015-05-22 NOTE — Telephone Encounter (Signed)
[place] of Dr Erick Blinks reply.  They are going ti speak with their billing department and get back with [place].

## 2015-05-23 NOTE — Telephone Encounter (Signed)
Please put order in for PT... Patient has an appointment today at 42

## 2015-05-23 NOTE — Telephone Encounter (Signed)
OK to set up PT as requested. 

## 2015-05-23 NOTE — Telephone Encounter (Signed)
Referral place 

## 2015-07-03 ENCOUNTER — Encounter: Payer: Self-pay | Admitting: Family Medicine

## 2015-07-03 ENCOUNTER — Ambulatory Visit (INDEPENDENT_AMBULATORY_CARE_PROVIDER_SITE_OTHER): Payer: PPO | Admitting: Family Medicine

## 2015-07-03 VITALS — BP 110/78 | HR 67 | Temp 98.0°F | Resp 16 | Ht 68.0 in | Wt 151.3 lb

## 2015-07-03 DIAGNOSIS — R490 Dysphonia: Secondary | ICD-10-CM | POA: Diagnosis not present

## 2015-07-03 DIAGNOSIS — R05 Cough: Secondary | ICD-10-CM | POA: Diagnosis not present

## 2015-07-03 DIAGNOSIS — I10 Essential (primary) hypertension: Secondary | ICD-10-CM

## 2015-07-03 DIAGNOSIS — R053 Chronic cough: Secondary | ICD-10-CM

## 2015-07-03 NOTE — Progress Notes (Signed)
Pre visit review using our clinic review tool, if applicable. No additional management support is needed unless otherwise documented below in the visit note. 

## 2015-07-03 NOTE — Progress Notes (Signed)
Subjective:    Patient ID: [name], male    DOB: 08/15/1946, 69 y.o.   MRN: [id number]  HPI Patient seen for medical follow-up.  He has multiple chronic  problems including hypertension, history of osteoporosis, cognitive impairment followed by neurology, lumbar spinal stenosis, essential tremor, history of GERD, hyperlipidemia.  He had severe cough several months ago and cough is slowly improving. He had explosive cough and possible exposure to pertussis. His screening test was normal. His cough is gradually improving. He does relate several episodes which occur almost daily where he cannot get words out in the middle speaking. This is not a failure to identify which words he wishes to speak but he feels is more of a mechanical issue. Sometimes after clearing his throat several times he is able to eventually speak. He has intermittent hoarseness-according to his son. He is not aware of any obvious reflux symptoms-though he does state he "clears his throat" frequently.  Hypertension treated with several drug regimen. Blood pressures have been stable.  Past Medical History  Diagnosis Date  . Nocturia   . Essential and other specified forms of tremor   . Hypersomnia with sleep apnea, unspecified   . Abdominal pain, right lower quadrant   . Acute prostatitis   . Spinal stenosis, lumbar region, without neurogenic claudication   . Fibromyalgia   . Urinary frequency   . Other testicular hypofunction   . Syncope and collapse   . Hypopotassemia   . Hemarthrosis, upper arm   . Unspecified adverse effect of unspecified drug, medicinal and biological substance   . Unspecified gastritis and gastroduodenitis without mention of hemorrhage   . Diverticulosis of colon (without mention of hemorrhage)   . Family history of malignant neoplasm of gastrointestinal tract   . Osteoporosis, unspecified   . Lumbago   . Ganglion and cyst of synovium, tendon, and bursa   . Hypertension   . GERD  (gastroesophageal reflux disease)   . Hyperlipidemia   . Constipation   . Sleep apnea     last sleep study 11/11 on chart- Bipap with settings of 4 per  patient  . Localized osteoarthrosis not specified whether primary or secondary, lower leg   . Degenerative disc disease   . Anxiety   . Depression   . Right inguinal hernia   . Memory difficulties 04/22/2013   Past Surgical History  Procedure Laterality Date  . Back surgery      x 3  . Microdisectomy  03/02/2003, 06/29/2003, 12/24/2005  . Hernia repair  1965, 1968  . Tonsillectomy    . Inguinal hernia repair  07/19/2011    Procedure: LAPAROSCOPIC INGUINAL HERNIA;  Surgeon: Odis Hollingshead, MD;  Location: [facility];  Service: General;  Laterality: Right;  Laparoscopic Repair of Recurrent Right  Ingunial Hernia with Mesh    reports that he has never smoked. He has never used smokeless tobacco. He reports that he does not drink alcohol or use illicit drugs. family history includes Colon cancer in his mother; Dementia in his mother; Lung cancer in his paternal grandfather; Parkinsonism (age of onset: 70) in his father. There is no history of Liver disease, Kidney disease, or Esophageal cancer. Allergies  Allergen Reactions  . Nucynta [Tapentadol] Other (See Comments)    dizziness  . Pregabalin     REACTION: dizziness and mental status changes  . Exelon [Rivastigmine]     abd pain      Review of Systems  Constitutional: Negative for  fever, chills, appetite change and unexpected weight change.  HENT: Positive for voice change. Negative for congestion, sore throat and trouble swallowing.   Respiratory: Positive for cough. Negative for shortness of breath and wheezing.   Cardiovascular: Negative for chest pain.  Neurological: Negative for dizziness, weakness and headaches.       Objective:   Physical Exam  Constitutional: He appears well-developed and well-nourished. No distress.  HENT:  Mouth/Throat: Oropharynx is clear and moist.   Neck: Neck supple. No JVD present. No thyromegaly present.  Cardiovascular: Normal rate and regular rhythm.   Pulmonary/Chest: Effort normal and breath sounds normal. No respiratory distress. He has no wheezes. He has no rales.  Musculoskeletal: He exhibits no edema.  Lymphadenopathy:    He has no cervical adenopathy.          Assessment & Plan:  #1 chronic cough. Slowly improving. We discussed possible discontinuation of losartan and switch to another medication if cough persists-but at this point he feels this is resolving #2 ?dysphonia. He's had difficulty getting out words at times. This does not sound like dysarthria.  ? Related to recent severe cough.  He does not acknowledge any slurred speech or difficulty finding appropriate words to say. Consider ENT referral. #3 hypertension.  Stable and at goal.

## 2015-07-12 ENCOUNTER — Ambulatory Visit: Payer: PPO | Admitting: Family Medicine

## 2015-07-23 ENCOUNTER — Other Ambulatory Visit: Payer: Self-pay | Admitting: Family Medicine

## 2015-08-20 ENCOUNTER — Other Ambulatory Visit: Payer: Self-pay | Admitting: Family Medicine

## 2015-08-23 ENCOUNTER — Encounter: Payer: Self-pay | Admitting: Podiatry

## 2015-08-23 ENCOUNTER — Ambulatory Visit (INDEPENDENT_AMBULATORY_CARE_PROVIDER_SITE_OTHER): Payer: PPO | Admitting: Podiatry

## 2015-08-23 ENCOUNTER — Ambulatory Visit (INDEPENDENT_AMBULATORY_CARE_PROVIDER_SITE_OTHER): Payer: PPO

## 2015-08-23 DIAGNOSIS — M779 Enthesopathy, unspecified: Secondary | ICD-10-CM

## 2015-08-23 MED ORDER — TRIAMCINOLONE ACETONIDE 10 MG/ML IJ SUSP
10.0000 mg | Freq: Once | INTRAMUSCULAR | Status: AC
  Administered 2015-08-23: 10 mg

## 2015-08-28 NOTE — Progress Notes (Signed)
Subjective:     Patient ID: [name] Brooking., male   DOB: 02-Jun-1946, 70 y.o.   MRN: [id number]  HPI patient presents stating I'm having a lot of pain on the inside of my left ankle and I don't remember an injury period has been hurting for several weeks   Review of Systems  All other systems reviewed and are negative.      Objective:   Physical Exam  Constitutional: He is oriented to person, place, and time.  Cardiovascular: Intact distal pulses.   Musculoskeletal: Normal range of motion.  Neurological: He is oriented to person, place, and time.  Skin: Skin is warm.  Nursing note and vitals reviewed.  neurovascular status found to be intact with muscle strength adequate range of motion within normal limits with patient noted to have discomfort and pain on the inside of the left ankle at the insertion of posterior tibial into the navicular. No indications of muscle dysfunction with good muscle strength and patient's found to have good digital perfusion and is well oriented 3     Assessment:      tendinitis of the posterior tibial tendon left    Plan:      H&P x-rays reviewed and today careful sheath injection administered 3 mg Kenalog 5 mg Xylocaine and advised on brace to lift up the arch. Patient had shoe gear modifications recommended and will be seen back to recheck

## 2015-09-07 ENCOUNTER — Ambulatory Visit (INDEPENDENT_AMBULATORY_CARE_PROVIDER_SITE_OTHER): Payer: PPO | Admitting: Podiatry

## 2015-09-07 ENCOUNTER — Encounter: Payer: Self-pay | Admitting: Podiatry

## 2015-09-07 DIAGNOSIS — T148 Other injury of unspecified body region: Secondary | ICD-10-CM | POA: Diagnosis not present

## 2015-09-07 DIAGNOSIS — M779 Enthesopathy, unspecified: Secondary | ICD-10-CM

## 2015-09-07 DIAGNOSIS — T148XXA Other injury of unspecified body region, initial encounter: Secondary | ICD-10-CM

## 2015-09-07 NOTE — Progress Notes (Signed)
Subjective:     Patient ID: [name]., male   DOB: [birth year], 70 y.o.   MRN: [id number]  HPI patient presents stating that my tendon is still really bothering me and my ankle is still hurting quite a bit   Review of Systems     Objective:   Physical Exam Neurovascular status intact with relative recent history of inflammation and pain in the posterior tibial tendon that did not respond to utilizing brace injection treatment and continues to be symptomatic    Assessment:     Acute posterior tibial tendinitis left    Plan:     Advised on physical therapy ice therapy and dispensed a short air fracture walker to completely immobilize the tendon and take stress off of it. Patient will be seen back to recheck

## 2015-09-28 ENCOUNTER — Ambulatory Visit (INDEPENDENT_AMBULATORY_CARE_PROVIDER_SITE_OTHER): Payer: PPO | Admitting: Podiatry

## 2015-09-28 ENCOUNTER — Encounter: Payer: Self-pay | Admitting: Podiatry

## 2015-09-28 VITALS — BP 170/85 | HR 60 | Resp 16

## 2015-09-28 DIAGNOSIS — M76829 Posterior tibial tendinitis, unspecified leg: Secondary | ICD-10-CM

## 2015-09-28 DIAGNOSIS — T148 Other injury of unspecified body region: Secondary | ICD-10-CM

## 2015-09-28 DIAGNOSIS — M6789 Other specified disorders of synovium and tendon, multiple sites: Secondary | ICD-10-CM

## 2015-09-28 DIAGNOSIS — T148XXA Other injury of unspecified body region, initial encounter: Secondary | ICD-10-CM

## 2015-09-28 DIAGNOSIS — M66872 Spontaneous rupture of other tendons, left ankle and foot: Secondary | ICD-10-CM

## 2015-09-28 NOTE — Progress Notes (Signed)
Subjective:     Patient ID: [name] Brooking., male   DOB: 03-Sep-1945, 70 y.o.   MRN: [id number]  HPI patient states he still having a lot of problems with his left foot and ankle and it does still feel like it's not functioning properly and the boot helped but the pain is still quite intense especially without the boot   Review of Systems     Objective:   Physical Exam Neurovascular status intact with dysfunction of the posterior tibial tendon left with collapse medial longitudinal arch but it does appear the tendon is not torn and appears to be more dysfunctional currently    Assessment:     Probable posterior tibial tendon dysfunction left with collapse medial longitudinal arch and continued stress on the tendon secondary to structure with boot that's helpful but cannot wear for the long-term    Plan:     At this time I've recommended a AFO type brace in order to provide for support through the arch and take stress off the posterior tibial tendon. If this does not solve the problem we will need to consider for this patient utilization of MRI to rule out tear of the tendon

## 2015-10-20 ENCOUNTER — Encounter: Payer: Self-pay | Admitting: Family Medicine

## 2015-10-20 DIAGNOSIS — M47816 Spondylosis without myelopathy or radiculopathy, lumbar region: Secondary | ICD-10-CM | POA: Diagnosis not present

## 2015-10-20 DIAGNOSIS — G894 Chronic pain syndrome: Secondary | ICD-10-CM | POA: Diagnosis not present

## 2015-10-20 NOTE — Telephone Encounter (Signed)
Keba please refer to ENT under dysphonia and if that wont take- hoarseness

## 2015-10-21 ENCOUNTER — Other Ambulatory Visit: Payer: Self-pay | Admitting: Family Medicine

## 2015-10-21 DIAGNOSIS — R490 Dysphonia: Secondary | ICD-10-CM

## 2015-10-26 ENCOUNTER — Ambulatory Visit: Payer: PPO | Admitting: *Deleted

## 2015-10-26 DIAGNOSIS — M76829 Posterior tibial tendinitis, unspecified leg: Secondary | ICD-10-CM

## 2015-10-26 NOTE — Progress Notes (Signed)
Patient ID: [name]., male   DOB: Mar 19, 1946, 70 y.o.   MRN: [id number] Patient presents to be casted for a brace with [facility] Certified Pedorthist.  Patient will return in 4 weeks to be fitted.

## 2015-11-09 ENCOUNTER — Ambulatory Visit (INDEPENDENT_AMBULATORY_CARE_PROVIDER_SITE_OTHER): Payer: PPO | Admitting: Adult Health

## 2015-11-09 ENCOUNTER — Encounter: Payer: Self-pay | Admitting: Adult Health

## 2015-11-09 VITALS — BP 152/84 | HR 68 | Ht 68.0 in | Wt 152.0 lb

## 2015-11-09 DIAGNOSIS — R413 Other amnesia: Secondary | ICD-10-CM

## 2015-11-09 NOTE — Progress Notes (Signed)
PATIENT: [name]. DOB: Aug 14, 1946  REASON FOR VISIT: follow up - memory HISTORY FROM: patient  HISTORY OF PRESENT ILLNESS: [name] is a 70 year old male with a history of mild memory disturbance. He returns today for follow-up. He remains on Namenda and is tolerating it well. He states that he has trouble with short-term memory. He states that he can go into a room and forget why he went in there. He states he also has trouble with lengthy  instructions. At home he is able to complete all ADLs independently. He operates a vehicle with no difficulty. Patient states that he is sleeping okay at night. Denies any hallucinations or delusions. The patient reports that his wife has been diagnosed with stage IV pancreatic cancer. She is currently undergoing chemotherapy. He states that this has added stress. He denies any new neurological symptoms. The patient states that he did have a collapsed arch and has been in physical therapy. He's also had increased back pain did to scoliosis. He returns today for an evaluation.  Update 05/11/15: [name] is a 70 year old male with a history of mild memory disorder. He returns today for follow-up. The patient is currently on Namenda. He is tolerating this medication well. Patient feels that his memory may have gotten slightly worse. He states that he tends to forget things as soon as he is told. He continues to have chronic back pain. He is followed by Dr. Hardin Negus however they have exhausted their efforts in trying to control his pain. He is able to complete all ADLs independently. He operates a Teacher, music without difficulty. Denies having to give up anything due to his memory. He is able to complete his finances without difficulty. Denies any new medical issues. He returns today for an evaluation.  HISTORY 11/03/14: [name] is a 71 year old right-handed white male with a history of a mild memory disorder. Since last seen, he believes there has been some  progression in the memory, he is having increasing problems using his cell phone and managing the remote on the TV. The patient is on Namenda as she could not tolerate Aricept or the Exelon patch. The patient is having a lot of problems with his low back pain, he has seen Dr. Hardin Negus previously. The patient has tried multiple modalities for the back pain without significant improvement. He has pain radiating into the hips on both sides. His ability to walk is limited somewhat by the low back pain, and he notes some problems with gait instability. He returns to this office for an evaluation. He continues to operate a motor vehicle. No significant safety issues have been noted with this.  REVIEW OF SYSTEMS: Out of a complete 14 system review of symptoms, the patient complains only of the following symptoms, and all other reviewed systems are negative.  Memory loss  ALLERGIES: Allergies  Allergen Reactions  . Nucynta [Tapentadol] Other (See Comments)    dizziness  . Pregabalin     REACTION: dizziness and mental status changes  . Exelon [Rivastigmine]     abd pain    HOME MEDICATIONS: Outpatient Prescriptions Prior to Visit  Medication Sig Dispense Refill  . amLODipine (NORVASC) 10 MG tablet Take 1 tablet (10 mg total) by mouth daily. 90 tablet 3  . Calcium Carbonate-Vitamin D (CALCIUM 600 + D PO) Take 1 tablet by mouth 2 (two) times daily.     . cholecalciferol (VITAMIN D) 1000 UNITS tablet Take 5,000 Units by mouth daily.     Marland Kitchen  citalopram (CELEXA) 40 MG tablet TAKE 1 TABLET EVERY MORNING 90 tablet 3  . Coenzyme Q10 200 MG capsule Take 200 mg by mouth daily.     . hydrochlorothiazide (MICROZIDE) 12.5 MG capsule Take 1 capsule (12.5 mg total) by mouth daily. 90 capsule 3  . Krill Oil 1000 MG CAPS Take 1 capsule by mouth 2 (two) times daily.    Marland Kitchen losartan (COZAAR) 100 MG tablet Take 1 tablet (100 mg total) by mouth daily. 90 tablet 3  . MAGNESIUM CITRATE PO Take 200 mg by mouth 2 (two) times  daily.     . memantine (NAMENDA) 10 MG tablet Take 1 tablet (10 mg total) by mouth 2 (two) times daily. 180 tablet 3  . Nutritional Supplements (ANTI-OXIDANT COMPLEX PO) Take 10 mg by mouth daily.     Marland Kitchen OVER THE COUNTER MEDICATION Take 1 scoop by mouth 2 (two) times daily. Raw fiber    . OVER THE COUNTER MEDICATION Place 1 application into both eyes 2 (two) times daily as needed. Dry eyes.  Refresh liquid gel    . potassium chloride SA (K-DUR,KLOR-CON) 20 MEQ tablet TAKE 1 TABLET (20 MEQ TOTAL) BY MOUTH DAILY. 30 tablet 11  . Probiotic Product (ALIGN) 4 MG CAPS Take by mouth daily.     . psyllium (REGULOID) 0.52 G capsule Take 0.52 g by mouth 2 (two) times daily.     . Red Yeast Rice 600 MG CAPS Take by mouth 2 (two) times daily.     Marland Kitchen UNABLE TO FIND Tumeric 1 qd    . valACYclovir (VALTREX) 500 MG tablet Take 1 tablet (500 mg total) by mouth 3 (three) times daily. 30 tablet 1  . vitamin B-12 (CYANOCOBALAMIN) 50 MCG tablet Take 50 mcg by mouth daily.     Marland Kitchen docusate sodium (COLACE) 100 MG capsule Take 1 capsule (100 mg total) by mouth 2 (two) times daily. 60 capsule 1  . guaiFENesin (MUCINEX) 600 MG 12 hr tablet Take 2 tablets (1,200 mg total) by mouth 2 (two) times daily. 30 tablet 0   No facility-administered medications prior to visit.    PAST MEDICAL HISTORY: Past Medical History  Diagnosis Date  . Nocturia   . Essential and other specified forms of tremor   . Hypersomnia with sleep apnea, unspecified   . Abdominal pain, right lower quadrant   . Acute prostatitis   . Spinal stenosis, lumbar region, without neurogenic claudication   . Fibromyalgia   . Urinary frequency   . Other testicular hypofunction   . Syncope and collapse   . Hypopotassemia   . Hemarthrosis, upper arm   . Unspecified adverse effect of unspecified drug, medicinal and biological substance   . Unspecified gastritis and gastroduodenitis without mention of hemorrhage   . Diverticulosis of colon (without mention of  hemorrhage)   . Family history of malignant neoplasm of gastrointestinal tract   . Osteoporosis, unspecified   . Lumbago   . Ganglion and cyst of synovium, tendon, and bursa   . Hypertension   . GERD (gastroesophageal reflux disease)   . Hyperlipidemia   . Constipation   . Sleep apnea     last sleep study 11/11 on chart- Bipap with settings of 4 per  patient  . Localized osteoarthrosis not specified whether primary or secondary, lower leg   . Degenerative disc disease   . Anxiety   . Depression   . Right inguinal hernia   . Memory difficulties 04/22/2013    PAST  SURGICAL HISTORY: Past Surgical History  Procedure Laterality Date  . Back surgery      x 3  . Microdisectomy  03/02/2003, 06/29/2003, 12/24/2005  . Hernia repair  1965, 1968  . Tonsillectomy    . Inguinal hernia repair  07/19/2011    Procedure: LAPAROSCOPIC INGUINAL HERNIA;  Surgeon: Odis Hollingshead, MD;  Location: [facility];  Service: General;  Laterality: Right;  Laparoscopic Repair of Recurrent Right  Ingunial Hernia with Mesh    FAMILY HISTORY: Family History  Problem Relation Age of Onset  . Colon cancer Mother   . Dementia Mother   . Parkinsonism Father 20  . Liver disease Neg Hx   . Kidney disease Neg Hx   . Esophageal cancer Neg Hx   . Lung cancer Paternal Grandfather     smoker    SOCIAL HISTORY: Social History   Social History  . Marital Status: Married    Spouse Name: N/A  . Number of Children: 2  . Years of Education: college   Occupational History  . retired    Social History Main Topics  . Smoking status: Never Smoker   . Smokeless tobacco: Never Used  . Alcohol Use: No     Comment: less than 5 per week  . Drug Use: No  . Sexual Activity: Yes   Other Topics Concern  . Not on file   Social History Narrative   Patient is right handed.   Patient drinks 1 cup of caffeine per day.      PHYSICAL EXAM  Filed Vitals:   11/09/15 0955  BP: 152/84  Pulse: 68  Height: 5\' 8"  (1.727  m)  Weight: 152 lb (68.947 kg)   Body mass index is 23.12 kg/(m^2).   MMSE - Mini Mental State Exam 11/09/2015 05/11/2015 11/03/2014  Orientation to time 5 5 5   Orientation to Place 5 5 5   Registration 3 3 3   Attention/ Calculation 5 5 5   Recall 3 3 3   Language- name 2 objects 2 2 2   Language- repeat 1 1 1   Language- follow 3 step command 3 3 3   Language- read & follow direction 1 1 1   Write a sentence 1 1 1   Copy design 1 1 1   Total score 30 30 30      Generalized: Well developed, in no acute distress   Neurological examination  Mentation: Alert oriented to time, place, history taking. Follows all commands speech and language fluent Cranial nerve II-XII: Pupils were equal round reactive to light. Extraocular movements were full, visual field were full on confrontational test. Facial sensation and strength were normal. Uvula tongue midline. Head turning and shoulder shrug  were normal and symmetric. Motor: The motor testing reveals 5 over 5 strength of all 4 extremities. Good symmetric motor tone is noted throughout.  Sensory: Sensory testing is intact to soft touch on all 4 extremities. No evidence of extinction is noted.  Coordination: Cerebellar testing reveals good finger-nose-finger and heel-to-shin bilaterally.  Gait and station: Gait is normal. Tandem gait is normal. Romberg is negative. No drift is seen.  Reflexes: Deep tendon reflexes are symmetric and normal bilaterally.   DIAGNOSTIC DATA (LABS, IMAGING, TESTING) - I reviewed patient records, labs, notes, testing and imaging myself where available.    ASSESSMENT AND PLAN 70 y.o. year old male  has a past medical history of Nocturia; Essential and other specified forms of tremor; Hypersomnia with sleep apnea, unspecified; Abdominal pain, right lower quadrant; Acute prostatitis; Spinal stenosis, lumbar  region, without neurogenic claudication; Fibromyalgia; Urinary frequency; Other testicular hypofunction; Syncope and  collapse; Hypopotassemia; Hemarthrosis, upper arm; Unspecified adverse effect of unspecified drug, medicinal and biological substance; Unspecified gastritis and gastroduodenitis without mention of hemorrhage; Diverticulosis of colon (without mention of hemorrhage); Family history of malignant neoplasm of gastrointestinal tract; Osteoporosis, unspecified; Lumbago; Ganglion and cyst of synovium, tendon, and bursa; Hypertension; GERD (gastroesophageal reflux disease); Hyperlipidemia; Constipation; Sleep apnea; Localized osteoarthrosis not specified whether primary or secondary, lower leg; Degenerative disc disease; Anxiety; Depression; Right inguinal hernia; and Memory difficulties (04/22/2013). here with:   1. Mild memory disturbance  Overall the patient's memory score has remained stable. He will remain on Namenda. Patient advised that if his symptoms worsen or he develops any new symptoms he should let us know. He will follow-up in 6 months with Dr. Jannifer Franklin.     [name] Givens, MSN, NP-C 11/09/2015, 10:48 AM [facility] [address], [place], [place] [address] [phone]

## 2015-11-09 NOTE — Progress Notes (Signed)
I have read the note, and I agree with the clinical assessment and plan.  WILLIS,CHARLES KEITH   

## 2015-11-09 NOTE — Patient Instructions (Signed)
Continue Namenda Memory score is stable If your symptoms worsen or you develop new symptoms please let us know.   

## 2015-11-10 ENCOUNTER — Other Ambulatory Visit: Payer: Self-pay | Admitting: Neurology

## 2015-11-18 ENCOUNTER — Other Ambulatory Visit: Payer: Self-pay | Admitting: Family Medicine

## 2015-11-20 ENCOUNTER — Ambulatory Visit: Payer: PPO | Admitting: *Deleted

## 2015-11-20 DIAGNOSIS — M66872 Spontaneous rupture of other tendons, left ankle and foot: Secondary | ICD-10-CM | POA: Diagnosis not present

## 2015-11-20 DIAGNOSIS — M76829 Posterior tibial tendinitis, unspecified leg: Secondary | ICD-10-CM

## 2015-11-20 NOTE — Progress Notes (Signed)
Patient ID: [name]., male   DOB: 12-Mar-1946, 70 y.o.   MRN: [id number] Patient presents for fitting of [place] with [facility] Certified Pedorthist. Written and verbal break in instructions given. Patient will follow up in 6 weeks with Dr. Paulla Dolly.

## 2015-11-21 ENCOUNTER — Other Ambulatory Visit: Payer: Self-pay | Admitting: Family Medicine

## 2016-01-01 ENCOUNTER — Encounter: Payer: Self-pay | Admitting: Family Medicine

## 2016-01-01 ENCOUNTER — Encounter: Payer: Self-pay | Admitting: Podiatry

## 2016-01-01 ENCOUNTER — Ambulatory Visit: Payer: PPO | Admitting: Podiatry

## 2016-01-01 ENCOUNTER — Ambulatory Visit (INDEPENDENT_AMBULATORY_CARE_PROVIDER_SITE_OTHER): Payer: PPO | Admitting: Family Medicine

## 2016-01-01 ENCOUNTER — Ambulatory Visit (INDEPENDENT_AMBULATORY_CARE_PROVIDER_SITE_OTHER): Payer: PPO | Admitting: Podiatry

## 2016-01-01 ENCOUNTER — Other Ambulatory Visit: Payer: Self-pay | Admitting: Family Medicine

## 2016-01-01 VITALS — BP 170/90 | HR 61 | Temp 98.6°F | Ht 68.0 in | Wt 153.0 lb

## 2016-01-01 DIAGNOSIS — Z9181 History of falling: Secondary | ICD-10-CM

## 2016-01-01 DIAGNOSIS — E876 Hypokalemia: Secondary | ICD-10-CM

## 2016-01-01 DIAGNOSIS — R531 Weakness: Secondary | ICD-10-CM

## 2016-01-01 DIAGNOSIS — G629 Polyneuropathy, unspecified: Secondary | ICD-10-CM | POA: Diagnosis not present

## 2016-01-01 DIAGNOSIS — M6789 Other specified disorders of synovium and tendon, multiple sites: Secondary | ICD-10-CM

## 2016-01-01 DIAGNOSIS — M779 Enthesopathy, unspecified: Secondary | ICD-10-CM

## 2016-01-01 DIAGNOSIS — M76829 Posterior tibial tendinitis, unspecified leg: Secondary | ICD-10-CM

## 2016-01-01 LAB — COMPREHENSIVE METABOLIC PANEL
ALT: 8 U/L (ref 0–53)
AST: 12 U/L (ref 0–37)
Albumin: 4.7 g/dL (ref 3.5–5.2)
Alkaline Phosphatase: 74 U/L (ref 39–117)
BILIRUBIN TOTAL: 1.1 mg/dL (ref 0.2–1.2)
BUN: 23 mg/dL (ref 6–23)
CALCIUM: 9.8 mg/dL (ref 8.4–10.5)
CHLORIDE: 100 meq/L (ref 96–112)
CO2: 36 meq/L — AB (ref 19–32)
Creatinine, Ser: 0.86 mg/dL (ref 0.40–1.50)
GFR: 93.19 mL/min (ref >60.00)
Glucose, Bld: 100 mg/dL — ABNORMAL HIGH (ref 70–99)
Potassium: 3 mEq/L — ABNORMAL LOW (ref 3.5–5.1)
Sodium: 144 mEq/L (ref 135–145)
Total Protein: 7.3 g/dL (ref 6.0–8.3)

## 2016-01-01 LAB — CBC WITH DIFFERENTIAL/PLATELET
BASOS PCT: 0.2 % (ref 0.0–3.0)
Basophils Absolute: 0 10*3/uL (ref 0.0–0.1)
EOS PCT: 1.2 % (ref 0.0–5.0)
Eosinophils Absolute: 0.1 10*3/uL (ref 0.0–0.7)
HEMATOCRIT: 41.3 % (ref 39.0–52.0)
Hemoglobin: 14.3 g/dL (ref 13.0–17.0)
LYMPHS PCT: 25.5 % (ref 12.0–46.0)
Lymphs Abs: 1.7 10*3/uL (ref 0.7–4.0)
MCHC: 34.6 g/dL (ref 30.0–36.0)
MCV: 91.7 fl (ref 78.0–100.0)
MONOS PCT: 8.1 % (ref 3.0–12.0)
Monocytes Absolute: 0.5 10*3/uL (ref 0.1–1.0)
NEUTROS ABS: 4.3 10*3/uL (ref 1.4–7.7)
Neutrophils Relative %: 65 % (ref 43.0–77.0)
PLATELETS: 188 10*3/uL (ref 150.0–400.0)
RBC: 4.51 Mil/uL (ref 4.22–5.81)
RDW: 13.5 % (ref 11.5–15.5)
WBC: 6.7 10*3/uL (ref 4.0–10.5)

## 2016-01-01 LAB — TSH: TSH: 1.61 u[IU]/mL (ref 0.35–4.50)

## 2016-01-01 MED ORDER — POTASSIUM CHLORIDE CRYS ER 20 MEQ PO TBCR: EXTENDED_RELEASE_TABLET | ORAL | Status: DC

## 2016-01-01 NOTE — Progress Notes (Signed)
Subjective:    Patient ID: [name]., male    DOB: [birth year], 70 y.o.   MRN: [id number]  HPI Patient seen today for medical follow-up  Major complaint is feeling "lightheaded". Nonspecific dizziness which is not consistently positional. He feels that increased risk of falls but he has not fallen recently. He has history of some memory disturbance is followed by neurology. He has history of essential tremor. Denies any vertigo. He has some neuropathy in his feet which also contributes to his balance issues.  Other medical problems include history of urinary urgency, lumbar stenosis, central sleep apnea, osteoarthritis, mild memory impairment, hypertension, and osteoporosis.  Denies any recent chest pains. Had some recent right inguinal pain for about 3 months but no obvious bulge. Prior history of hernia repair.  Past Medical History  Diagnosis Date  . Nocturia   . Essential and other specified forms of tremor   . Hypersomnia with sleep apnea, unspecified   . Abdominal pain, right lower quadrant   . Acute prostatitis   . Spinal stenosis, lumbar region, without neurogenic claudication   . Fibromyalgia   . Urinary frequency   . Other testicular hypofunction   . Syncope and collapse   . Hypopotassemia   . Hemarthrosis, upper arm   . Unspecified adverse effect of unspecified drug, medicinal and biological substance   . Unspecified gastritis and gastroduodenitis without mention of hemorrhage   . Diverticulosis of colon (without mention of hemorrhage)   . Family history of malignant neoplasm of gastrointestinal tract   . Osteoporosis, unspecified   . Lumbago   . Ganglion and cyst of synovium, tendon, and bursa   . Hypertension   . GERD (gastroesophageal reflux disease)   . Hyperlipidemia   . Constipation   . Sleep apnea     last sleep study 11/11 on chart- Bipap with settings of 4 per  patient  . Localized osteoarthrosis not specified whether primary or secondary, lower  leg   . Degenerative disc disease   . Anxiety   . Depression   . Right inguinal hernia   . Memory difficulties 04/22/2013   Past Surgical History  Procedure Laterality Date  . Back surgery      x 3  . Microdisectomy  03/02/2003, 06/29/2003, 12/24/2005  . Hernia repair  1965, 1968  . Tonsillectomy    . Inguinal hernia repair  07/19/2011    Procedure: LAPAROSCOPIC INGUINAL HERNIA;  Surgeon: Odis Hollingshead, MD;  Location: [facility];  Service: General;  Laterality: Right;  Laparoscopic Repair of Recurrent Right  Ingunial Hernia with Mesh    reports that he has never smoked. He has never used smokeless tobacco. He reports that he does not drink alcohol or use illicit drugs. family history includes Colon cancer in his mother; Dementia in his mother; Lung cancer in his paternal grandfather; Parkinsonism (age of onset: 39) in his father. There is no history of Liver disease, Kidney disease, or Esophageal cancer. Allergies  Allergen Reactions  . Nucynta [Tapentadol] Other (See Comments)    dizziness  . Pregabalin     REACTION: dizziness and mental status changes  . Exelon [Rivastigmine]     abd pain      Review of Systems  Constitutional: Negative for fatigue.  Eyes: Negative for visual disturbance.  Respiratory: Negative for cough, chest tightness and shortness of breath.   Cardiovascular: Negative for chest pain, palpitations and leg swelling.  Genitourinary: Negative for dysuria.  Musculoskeletal: Positive for back pain.  Neurological: Positive for dizziness, weakness (Generalized but not focal) and light-headedness. Negative for syncope and headaches.  Psychiatric/Behavioral: Negative for confusion.       Objective:   Physical Exam  Constitutional: He appears well-developed and well-nourished.  Neck: Neck supple. No thyromegaly present.  Cardiovascular: Normal rate and regular rhythm.  Exam reveals no gallop.   No murmur heard. Pulmonary/Chest: Effort normal and breath sounds  normal. No respiratory distress. He has no wheezes. He has no rales.  Musculoskeletal: He exhibits no edema.  Lymphadenopathy:    He has no cervical adenopathy.  Neurological: He is alert.  No focal weakness. No tremor noted. Romberg is normal          Assessment & Plan:  #1 increased risk of falls. He has some history of peripheral neuropathy which is likely contributing though his Romberg test is normal. We recommended setting up physical therapy for fall prevention and he agrees with this plan  #2 hypertension. Poorly controlled this morning but very well controlled by home readings. He did not take his medication this morning. Continue close monitoring and be in touch if consistently greater than 140/90  #3 hx of Hypokalemia.  Repeat BMP.    Eulas Post MD [facility] at [facility]

## 2016-01-01 NOTE — Progress Notes (Signed)
Pre visit review using our clinic review tool, if applicable. No additional management support is needed unless otherwise documented below in the visit note. 

## 2016-01-02 ENCOUNTER — Encounter: Payer: Self-pay | Admitting: Gastroenterology

## 2016-01-03 NOTE — Progress Notes (Signed)
Subjective:     Patient ID: [name] Brooking., male   DOB: Sep 27, 1945, 70 y.o.   MRN: [id number]  HPI patient states overall doing pretty well but the braces hard to wear in certain shoes   Review of Systems     Objective:   Physical Exam Neurovascular status intact with brace that fitted well and is taking a lot of pressure off his foot and leg but does not fit in all shoes    Assessment:     Collapse medial longitudinal arch with tendinitis that's done well with air zone a brace    Plan:     Advised on shoe gear modifications anti-inflammatories and not going barefoot. Reappoint as needed

## 2016-01-09 ENCOUNTER — Other Ambulatory Visit (INDEPENDENT_AMBULATORY_CARE_PROVIDER_SITE_OTHER): Payer: PPO

## 2016-01-09 DIAGNOSIS — E876 Hypokalemia: Secondary | ICD-10-CM | POA: Diagnosis not present

## 2016-01-09 DIAGNOSIS — H04123 Dry eye syndrome of bilateral lacrimal glands: Secondary | ICD-10-CM | POA: Diagnosis not present

## 2016-01-09 LAB — COMPREHENSIVE METABOLIC PANEL
ALT: 7 U/L (ref 0–53)
AST: 11 U/L (ref 0–37)
Albumin: 4.2 g/dL (ref 3.5–5.2)
Alkaline Phosphatase: 59 U/L (ref 39–117)
BUN: 18 mg/dL (ref 6–23)
CALCIUM: 9.5 mg/dL (ref 8.4–10.5)
CHLORIDE: 104 meq/L (ref 96–112)
CO2: 35 meq/L — AB (ref 19–32)
CREATININE: 0.99 mg/dL (ref 0.40–1.50)
GFR: 79.21 mL/min (ref >60.00)
Glucose, Bld: 106 mg/dL — ABNORMAL HIGH (ref 70–99)
POTASSIUM: 3.2 meq/L — AB (ref 3.5–5.1)
Sodium: 146 mEq/L — ABNORMAL HIGH (ref 135–145)
Total Bilirubin: 0.7 mg/dL (ref 0.2–1.2)
Total Protein: 6.4 g/dL (ref 6.0–8.3)

## 2016-01-10 MED ORDER — POTASSIUM CHLORIDE CRYS ER 20 MEQ PO TBCR: EXTENDED_RELEASE_TABLET | ORAL | Status: DC

## 2016-02-02 ENCOUNTER — Ambulatory Visit (INDEPENDENT_AMBULATORY_CARE_PROVIDER_SITE_OTHER): Payer: PPO | Admitting: Family Medicine

## 2016-02-02 ENCOUNTER — Encounter: Payer: Self-pay | Admitting: Family Medicine

## 2016-02-02 DIAGNOSIS — E876 Hypokalemia: Secondary | ICD-10-CM

## 2016-02-02 LAB — BASIC METABOLIC PANEL
BUN: 17 mg/dL (ref 6–23)
CALCIUM: 9.9 mg/dL (ref 8.4–10.5)
CHLORIDE: 100 meq/L (ref 96–112)
CO2: 31 meq/L (ref 19–32)
Creatinine, Ser: 1.09 mg/dL (ref 0.40–1.50)
GFR: 70.87 mL/min (ref >60.00)
GLUCOSE: 115 mg/dL — AB (ref 70–99)
Potassium: 3.2 mEq/L — ABNORMAL LOW (ref 3.5–5.1)
SODIUM: 140 meq/L (ref 135–145)

## 2016-02-02 NOTE — Patient Instructions (Signed)
Consider elevate head of bed about 6 inches.

## 2016-02-02 NOTE — Progress Notes (Signed)
Pre visit review using our clinic review tool, if applicable. No additional management support is needed unless otherwise documented below in the visit note. 

## 2016-02-02 NOTE — Progress Notes (Signed)
Subjective:    Patient ID: [name] Brooking., male    DOB: 1945/11/29, 70 y.o.   MRN: [id number]  HPI  Hypertension. Currently treated with HCTZ, losartan, and amlodipine Blood pressure relatively stable by home readings. Mostly around 140/70 recently.  Recurrent hypokalemia. Currently takes 60 meq potassium daily. On HCTZ. Does not recall taking potassium sparing diuretic previously.  Tries to eat potassium rich foods. He has chronic nonspecific lightheadedness and weakness  Chronic hoarseness. Previous ENT evaluation. Took Protonix for 3 months without improvement. No active postnasal drip symptoms.  Recent balance issues. Started PT and he thinks that has help somewhat  Past Medical History  Diagnosis Date  . Nocturia   . Essential and other specified forms of tremor   . Hypersomnia with sleep apnea, unspecified   . Abdominal pain, right lower quadrant   . Acute prostatitis   . Spinal stenosis, lumbar region, without neurogenic claudication   . Fibromyalgia   . Urinary frequency   . Other testicular hypofunction   . Syncope and collapse   . Hypopotassemia   . Hemarthrosis, upper arm   . Unspecified adverse effect of unspecified drug, medicinal and biological substance   . Unspecified gastritis and gastroduodenitis without mention of hemorrhage   . Diverticulosis of colon (without mention of hemorrhage)   . Family history of malignant neoplasm of gastrointestinal tract   . Osteoporosis, unspecified   . Lumbago   . Ganglion and cyst of synovium, tendon, and bursa   . Hypertension   . GERD (gastroesophageal reflux disease)   . Hyperlipidemia   . Constipation   . Sleep apnea     last sleep study 11/11 on chart- Bipap with settings of 4 per  patient  . Localized osteoarthrosis not specified whether primary or secondary, lower leg   . Degenerative disc disease   . Anxiety   . Depression   . Right inguinal hernia   . Memory difficulties 04/22/2013   Past Surgical History   Procedure Laterality Date  . Back surgery      x 3  . Microdisectomy  03/02/2003, 06/29/2003, 12/24/2005  . Hernia repair  1965, 1968  . Tonsillectomy    . Inguinal hernia repair  07/19/2011    Procedure: LAPAROSCOPIC INGUINAL HERNIA;  Surgeon: Odis Hollingshead, MD;  Location: [facility];  Service: General;  Laterality: Right;  Laparoscopic Repair of Recurrent Right  Ingunial Hernia with Mesh    reports that he has never smoked. He has never used smokeless tobacco. He reports that he does not drink alcohol or use illicit drugs. family history includes Colon cancer in his mother; Dementia in his mother; Lung cancer in his paternal grandfather; Parkinsonism (age of onset: 22) in his father. There is no history of Liver disease, Kidney disease, or Esophageal cancer. Allergies  Allergen Reactions  . Nucynta [Tapentadol] Other (See Comments)    dizziness  . Pregabalin     REACTION: dizziness and mental status changes  . Exelon [Rivastigmine]     abd pain      Review of Systems  Constitutional: Positive for fatigue. Negative for fever and chills.  Eyes: Negative for visual disturbance.  Respiratory: Negative for cough, chest tightness and shortness of breath.   Cardiovascular: Negative for chest pain, palpitations and leg swelling.  Neurological: Positive for light-headedness. Negative for dizziness, syncope, weakness and headaches.       Objective:   Physical Exam  Constitutional: He appears well-developed and well-nourished.  Cardiovascular: Normal rate  and regular rhythm.   Pulmonary/Chest: Effort normal and breath sounds normal. No respiratory distress. He has no wheezes. He has no rales.  Musculoskeletal: He exhibits no edema.          Assessment & Plan:  #1 hypertension. Repeat reading after rest 140/70.  #2 recurrent hypokalemia. Recheck basic metabolic panel. Consider switch to potassium sparing diuretic  #3 chronic hoarseness. No improvement with PPI. Recent ENT  evaluation as above. Consider elevate head of bed 6-8 inches  Eulas Post MD [facility] at [facility]

## 2016-02-06 MED ORDER — POTASSIUM CHLORIDE CRYS ER 20 MEQ PO TBCR: EXTENDED_RELEASE_TABLET | ORAL | Status: DC

## 2016-02-06 MED ORDER — TRIAMTERENE-HCTZ 37.5-25 MG PO TABS
1.0000 | ORAL_TABLET | Freq: Every day | ORAL | Status: DC
Start: 2016-02-06 — End: 2016-03-20

## 2016-02-06 NOTE — Addendum Note (Signed)
Addended by: Elio Forget on: 02/06/2016 10:49 AM   Modules accepted: Orders, Medications

## 2016-02-06 NOTE — Addendum Note (Signed)
Addended by: Elio Forget on: 02/06/2016 10:50 AM   Modules accepted: Orders

## 2016-02-15 ENCOUNTER — Other Ambulatory Visit: Payer: Self-pay | Admitting: Family Medicine

## 2016-02-21 ENCOUNTER — Other Ambulatory Visit (INDEPENDENT_AMBULATORY_CARE_PROVIDER_SITE_OTHER): Payer: PPO

## 2016-02-21 DIAGNOSIS — E876 Hypokalemia: Secondary | ICD-10-CM

## 2016-02-21 LAB — BASIC METABOLIC PANEL
BUN: 22 mg/dL (ref 6–23)
CALCIUM: 9.6 mg/dL (ref 8.4–10.5)
CO2: 36 meq/L — AB (ref 19–32)
Chloride: 99 mEq/L (ref 96–112)
Creatinine, Ser: 1.22 mg/dL (ref 0.40–1.50)
GFR: 62.22 mL/min (ref >60.00)
GLUCOSE: 123 mg/dL — AB (ref 70–99)
Potassium: 3.2 mEq/L — ABNORMAL LOW (ref 3.5–5.1)
SODIUM: 142 meq/L (ref 135–145)

## 2016-02-27 ENCOUNTER — Other Ambulatory Visit: Payer: Self-pay | Admitting: Family Medicine

## 2016-03-18 ENCOUNTER — Other Ambulatory Visit (INDEPENDENT_AMBULATORY_CARE_PROVIDER_SITE_OTHER): Payer: PPO

## 2016-03-18 DIAGNOSIS — E876 Hypokalemia: Secondary | ICD-10-CM | POA: Diagnosis not present

## 2016-03-18 LAB — BASIC METABOLIC PANEL
BUN: 20 mg/dL (ref 6–23)
CHLORIDE: 102 meq/L (ref 96–112)
CO2: 33 mEq/L — ABNORMAL HIGH (ref 19–32)
Calcium: 9.4 mg/dL (ref 8.4–10.5)
Creatinine, Ser: 1.17 mg/dL (ref 0.40–1.50)
GFR: 65.29 mL/min (ref >60.00)
Glucose, Bld: 127 mg/dL — ABNORMAL HIGH (ref 70–99)
POTASSIUM: 2.9 meq/L — AB (ref 3.5–5.1)
SODIUM: 143 meq/L (ref 135–145)

## 2016-03-20 ENCOUNTER — Other Ambulatory Visit: Payer: Self-pay

## 2016-03-20 MED ORDER — METOPROLOL TARTRATE 25 MG PO TABS: ORAL_TABLET | ORAL | 11 refills | Status: DC

## 2016-03-22 ENCOUNTER — Telehealth: Payer: Self-pay | Admitting: Family Medicine

## 2016-03-22 NOTE — Telephone Encounter (Signed)
Please Advise

## 2016-03-22 NOTE — Telephone Encounter (Signed)
Pt states the new bp medicine on Wed, his BP has been running high. Today 188/95, yesterday 176/80, and wed, 179/85  Please advise  [facility]

## 2016-04-01 ENCOUNTER — Encounter: Payer: Self-pay | Admitting: Podiatry

## 2016-04-01 ENCOUNTER — Ambulatory Visit (INDEPENDENT_AMBULATORY_CARE_PROVIDER_SITE_OTHER): Payer: PPO | Admitting: Podiatry

## 2016-04-01 DIAGNOSIS — G629 Polyneuropathy, unspecified: Secondary | ICD-10-CM

## 2016-04-01 DIAGNOSIS — M76829 Posterior tibial tendinitis, unspecified leg: Secondary | ICD-10-CM

## 2016-04-01 DIAGNOSIS — M779 Enthesopathy, unspecified: Secondary | ICD-10-CM | POA: Diagnosis not present

## 2016-04-01 DIAGNOSIS — T148XXA Other injury of unspecified body region, initial encounter: Secondary | ICD-10-CM

## 2016-04-01 MED ORDER — TRIAMCINOLONE ACETONIDE 10 MG/ML IJ SUSP
10.0000 mg | Freq: Once | INTRAMUSCULAR | Status: AC
  Administered 2016-04-01: 10 mg

## 2016-04-04 NOTE — Progress Notes (Signed)
Subjective:     Patient ID: [name] Brooking., male   DOB: [birth year], 70 y.o.   MRN: [id number]  HPI patient presents with exquisite discomfort on the medial side of the foot with inflammation around the posterior tibial tendon around its insertion into the navicular. Muscle strength appears normal   Review of Systems     Objective:   Physical Exam Neurovascular status was intact with patient found to have inflammatory changes around the posterior tibial tendon as it inserts into the navicular. It is painful when pressed and there is depression of the arch with possibility of dysfunction of the tendon occurring    Assessment:     Possibility for posterior tibial tendon dysfunction with slight possibility of tear    Plan:     Careful sheath injection administered 3 mg Kenalog 5 mg Xylocaine and applied air fracture walker to immobilize. Reappoint 3 weeks for reevaluation or earlier if needed

## 2016-04-11 ENCOUNTER — Other Ambulatory Visit (INDEPENDENT_AMBULATORY_CARE_PROVIDER_SITE_OTHER): Payer: PPO

## 2016-04-11 DIAGNOSIS — E876 Hypokalemia: Secondary | ICD-10-CM

## 2016-04-11 LAB — BASIC METABOLIC PANEL
BUN: 15 mg/dL (ref 6–23)
CHLORIDE: 101 meq/L (ref 96–112)
CO2: 34 meq/L — AB (ref 19–32)
Calcium: 8.8 mg/dL (ref 8.4–10.5)
Creatinine, Ser: 0.92 mg/dL (ref 0.40–1.50)
GFR: 86.14 mL/min (ref >60.00)
Glucose, Bld: 86 mg/dL (ref 70–99)
Potassium: 3.6 mEq/L (ref 3.5–5.1)
SODIUM: 142 meq/L (ref 135–145)

## 2016-04-23 ENCOUNTER — Ambulatory Visit: Payer: PPO | Admitting: *Deleted

## 2016-04-23 DIAGNOSIS — M779 Enthesopathy, unspecified: Secondary | ICD-10-CM

## 2016-04-23 NOTE — Progress Notes (Signed)
Patient ID: [name]., male   DOB: [birth year], 70 y.o.   MRN: [id number]  Patient presents for orthotic pick up.  Verbal and written break in and wear instructions given.  Patient will follow up in 4 weeks if symptoms worsen or fail to improve.

## 2016-04-23 NOTE — Patient Instructions (Signed)

## 2016-04-29 ENCOUNTER — Other Ambulatory Visit: Payer: Self-pay | Admitting: Family Medicine

## 2016-05-13 ENCOUNTER — Other Ambulatory Visit: Payer: Self-pay | Admitting: Family Medicine

## 2016-05-14 ENCOUNTER — Encounter: Payer: Self-pay | Admitting: Family Medicine

## 2016-05-14 ENCOUNTER — Ambulatory Visit (INDEPENDENT_AMBULATORY_CARE_PROVIDER_SITE_OTHER): Payer: PPO | Admitting: Family Medicine

## 2016-05-14 VITALS — BP 120/80 | HR 44 | Temp 97.7°F | Ht 68.0 in | Wt 147.0 lb

## 2016-05-14 DIAGNOSIS — Z23 Encounter for immunization: Secondary | ICD-10-CM

## 2016-05-14 DIAGNOSIS — M545 Low back pain: Secondary | ICD-10-CM

## 2016-05-14 DIAGNOSIS — I10 Essential (primary) hypertension: Secondary | ICD-10-CM | POA: Diagnosis not present

## 2016-05-14 DIAGNOSIS — R1030 Lower abdominal pain, unspecified: Secondary | ICD-10-CM

## 2016-05-14 DIAGNOSIS — R1031 Right lower quadrant pain: Secondary | ICD-10-CM

## 2016-05-14 DIAGNOSIS — Z9181 History of falling: Secondary | ICD-10-CM | POA: Diagnosis not present

## 2016-05-14 DIAGNOSIS — G8929 Other chronic pain: Secondary | ICD-10-CM

## 2016-05-14 NOTE — Patient Instructions (Signed)
We will set up home PT for low back pain.

## 2016-05-14 NOTE — Progress Notes (Signed)
Subjective:     Patient ID: [name] Brooking., male   DOB: [birth year], 70 y.o.   MRN: [id number]  HPI Patient seen for the following issues  Hypertension. He had recurrent hypokalemia and we eventually discontinued his Dyazide and started Lopressor and follow-up labs revealed hypokalemia finally resolved. This was not resolving with potassium replacement.  Recent right inguinal pain. He had hernia surgery estimated about 5 years ago. He's had some pain ever since that surgery. He was concerned he may have recurrent hernia. He has some burning sensation in that region.  Chronic low back pain. He's had multiple surgeries. He has somewhat poorly localized low back pain with some radiation toward the right hip. Severity 7 out of 10. He has some chronic neuropathy symptoms in the lower extremities and is followed by neurology for that. His pain is frequently worse with standing and walking. Possibly relieved with back flexion and worse with back extension. He's become less mobile and is walking much less recently  Wife died after prolonged battle with pancreatic cancer recently. He has very supportive son who is with him today.  Past Medical History:  Diagnosis Date  . Abdominal pain, right lower quadrant   . Acute prostatitis   . Anxiety   . Constipation   . Degenerative disc disease   . Depression   . Diverticulosis of colon (without mention of hemorrhage)   . Essential and other specified forms of tremor   . Family history of malignant neoplasm of gastrointestinal tract   . Fibromyalgia   . Ganglion and cyst of synovium, tendon, and bursa   . GERD (gastroesophageal reflux disease)   . Hemarthrosis, upper arm   . Hyperlipidemia   . Hypersomnia with sleep apnea, unspecified   . Hypertension   . Hypopotassemia   . Localized osteoarthrosis not specified whether primary or secondary, lower leg   . Lumbago   . Memory difficulties 04/22/2013  . Nocturia   . Osteoporosis, unspecified   .  Other testicular hypofunction   . Right inguinal hernia   . Sleep apnea    last sleep study 11/11 on chart- Bipap with settings of 4 per  patient  . Spinal stenosis, lumbar region, without neurogenic claudication   . Syncope and collapse   . Unspecified adverse effect of unspecified drug, medicinal and biological substance   . Unspecified gastritis and gastroduodenitis without mention of hemorrhage   . Urinary frequency    Past Surgical History:  Procedure Laterality Date  . BACK SURGERY     x 3  . [place]  . INGUINAL HERNIA REPAIR  07/19/2011   Procedure: LAPAROSCOPIC INGUINAL HERNIA;  Surgeon: Odis Hollingshead, MD;  Location: [facility];  Service: General;  Laterality: Right;  Laparoscopic Repair of Recurrent Right  Ingunial Hernia with Mesh  . microdisectomy  03/02/2003, 06/29/2003, 12/24/2005  . TONSILLECTOMY      reports that he has never smoked. He has never used smokeless tobacco. He reports that he does not drink alcohol or use drugs. family history includes Colon cancer in his mother; Dementia in his mother; Lung cancer in his paternal grandfather; Parkinsonism (age of onset: 28) in his father. Allergies  Allergen Reactions  . Nucynta [Tapentadol] Other (See Comments)    dizziness  . Pregabalin     REACTION: dizziness and mental status changes  . Exelon [Rivastigmine]     abd pain     Review of Systems  Constitutional: Negative for fatigue and  unexpected weight change.  Eyes: Negative for visual disturbance.  Respiratory: Negative for cough, chest tightness and shortness of breath.   Cardiovascular: Negative for chest pain, palpitations and leg swelling.  Endocrine: Negative for polydipsia and polyuria.  Musculoskeletal: Positive for back pain.  Neurological: Negative for dizziness, syncope, weakness, light-headedness and headaches.       Objective:   Physical Exam  Constitutional: He is oriented to person, place, and time. He appears well-developed  and well-nourished.  Cardiovascular: Regular rhythm.   Pulmonary/Chest: Effort normal and breath sounds normal. No respiratory distress. He has no wheezes. He has no rales.  Genitourinary:  Genitourinary Comments: Cannot appreciate any recurrent right inguinal hernia. He does have some very mild tenderness in right angle region. No sniffed and adenopathy.  Musculoskeletal: He exhibits no edema.  Neurological: He is alert and oriented to person, place, and time.  2+ reflexes knee bilaterally and 1+ ankle bilaterally. He has full strength with plantarflexion, dorsiflexion, and knee extension bilaterally       Assessment:     #1 hypertension stable and at goal with recent change in blood pressure medication as above  #2 right inguinal pain with no evidence for recurrent hernia  #3 chronic back pain. Question component of lumbar stenosis    Plan:     -Set up home physical therapy for back stretches and exercises and general strengthening -Continue current blood pressure medications -Routine follow-up in 3 months -flu vaccine given.  Eulas Post MD [facility]

## 2016-05-14 NOTE — Progress Notes (Signed)
Pre visit review using our clinic review tool, if applicable. No additional management support is needed unless otherwise documented below in the visit note. 

## 2016-05-16 ENCOUNTER — Encounter: Payer: Self-pay | Admitting: Neurology

## 2016-05-16 ENCOUNTER — Ambulatory Visit (INDEPENDENT_AMBULATORY_CARE_PROVIDER_SITE_OTHER): Payer: PPO | Admitting: Neurology

## 2016-05-16 VITALS — BP 136/71 | HR 51 | Ht 68.0 in | Wt 144.5 lb

## 2016-05-16 DIAGNOSIS — G20 Parkinson's disease: Secondary | ICD-10-CM | POA: Diagnosis not present

## 2016-05-16 DIAGNOSIS — R269 Unspecified abnormalities of gait and mobility: Secondary | ICD-10-CM | POA: Insufficient documentation

## 2016-05-16 DIAGNOSIS — G20A1 Parkinson's disease without dyskinesia, without mention of fluctuations: Secondary | ICD-10-CM

## 2016-05-16 DIAGNOSIS — R413 Other amnesia: Secondary | ICD-10-CM | POA: Diagnosis not present

## 2016-05-16 DIAGNOSIS — M545 Low back pain, unspecified: Secondary | ICD-10-CM

## 2016-05-16 HISTORY — DX: Parkinson's disease without dyskinesia, without mention of fluctuations: G20.A1

## 2016-05-16 HISTORY — DX: Unspecified abnormalities of gait and mobility: R26.9

## 2016-05-16 HISTORY — DX: Parkinson's disease: G20

## 2016-05-16 MED ORDER — CARBIDOPA-LEVODOPA 25-100 MG PO TABS: 0.5000 | ORAL_TABLET | Freq: Two times a day (BID) | ORAL | 2 refills | Status: DC

## 2016-05-16 NOTE — Progress Notes (Signed)
Reason for visit: Memory disturbance  [name]. is an 70 y.o. male  History of present illness:  [name] is a 70 year old right-handed white male with a history of a mild memory disturbance. [name] is now living alone, his wife died with pancreatic cancer. [name] has had a change in his functional level over [name] last 6 months. His activities have become slower and slower, he has had some worsening of memory. He is having a change in his walking, he is shuffling his feet, at times feels that his feet are "glued to [name] floor". [name] has not had any falls. He is more stooped, his voice has a lower amplitude, his face has become less animated. He has had micrographia with handwriting, his handwriting has become sloppy, he has not reported any tremors. [name] denies any problems with swallowing. His father had Parkinson's disease. [name] has had an increase in right flank pain, pain into [name] right groin. He has had a prior history of lumbosacral spine surgery. Epidural steroid injections in [name] past have not been beneficial. [name] remains on Namenda for his memory. [name] is not operating a motor vehicle at this time, he is able to manage his medications, appointments, and finances on his own. He returns for an evaluation.  Past Medical History:  Diagnosis Date  . Abdominal pain, right lower quadrant   . Acute prostatitis   . Anxiety   . Constipation   . Degenerative disc disease   . Depression   . Diverticulosis of colon (without mention of hemorrhage)   . Essential and other specified forms of tremor   . Family history of malignant neoplasm of gastrointestinal tract   . Fibromyalgia   . Ganglion and cyst of synovium, tendon, and bursa   . GERD (gastroesophageal reflux disease)   . Hemarthrosis, upper arm   . Hyperlipidemia   . Hypersomnia with sleep apnea, unspecified   . Hypertension   . Hypopotassemia   . Localized osteoarthrosis not  specified whether primary or secondary, lower leg   . Lumbago   . Memory difficulties 04/22/2013  . Nocturia   . Osteoporosis, unspecified   . Other testicular hypofunction   . Right inguinal hernia   . Sleep apnea    last sleep study 11/11 on chart- Bipap with settings of 4 per  [name]  . Spinal stenosis, lumbar region, without neurogenic claudication   . Syncope and collapse   . Unspecified adverse effect of unspecified drug, medicinal and biological substance   . Unspecified gastritis and gastroduodenitis without mention of hemorrhage   . Urinary frequency     Past Surgical History:  Procedure Laterality Date  . BACK SURGERY     x 3  . [place]  . INGUINAL HERNIA REPAIR  07/19/2011   Procedure: LAPAROSCOPIC INGUINAL HERNIA;  Surgeon: Odis Hollingshead, MD;  Location: [facility];  Service: General;  Laterality: Right;  Laparoscopic Repair of Recurrent Right  Ingunial Hernia with Mesh  . microdisectomy  03/02/2003, 06/29/2003, 12/24/2005  . TONSILLECTOMY      Family History  Problem Relation Age of Onset  . Colon cancer Mother   . Dementia Mother   . Parkinsonism Father 20  . Lung cancer Paternal Grandfather     smoker  . Liver disease Neg Hx   . Kidney disease Neg Hx   . Esophageal cancer Neg Hx     Social history:  reports  that he has never smoked. He has never used smokeless tobacco. He reports that he does not drink alcohol or use drugs.    Allergies  Allergen Reactions  . Nucynta [Tapentadol] Other (See Comments)    dizziness  . Pregabalin     REACTION: dizziness and mental status changes  . Exelon [Rivastigmine]     abd pain    Medications:  Prior to Admission medications   Medication Sig Start Date End Date Taking? Authorizing Provider  amLODipine (NORVASC) 10 MG tablet TAKE 1 TABLET (10 MG TOTAL) BY MOUTH DAILY. 02/15/16   Eulas Post, MD  Calcium Carbonate-Vitamin D (CALCIUM 600 + D PO) Take 1 tablet by mouth 2 (two) times daily.      Historical Provider, MD  cholecalciferol (VITAMIN D) 1000 UNITS tablet Take 5,000 Units by mouth daily.     Historical Provider, MD  citalopram (CELEXA) 40 MG tablet TAKE 1 TABLET EVERY MORNING 04/29/16   Eulas Post, MD  Coenzyme Q10 200 MG capsule Take 200 mg by mouth daily.     Historical Provider, MD  Javier Docker Oil 1000 MG CAPS Take 1 capsule by mouth 2 (two) times daily.    Historical Provider, MD  losartan (COZAAR) 100 MG tablet TAKE 1 TABLET (100 MG TOTAL) BY MOUTH DAILY. 05/14/16   Eulas Post, MD  MAGNESIUM CITRATE PO Take 200 mg by mouth 2 (two) times daily.     Historical Provider, MD  memantine (NAMENDA) 10 MG tablet TAKE 1 TABLET (10 MG TOTAL) BY MOUTH 2 (TWO) TIMES DAILY. 11/10/15   Kathrynn Ducking, MD  metoprolol tartrate (LOPRESSOR) 25 MG tablet Take one tablet once per day. 03/20/16   Eulas Post, MD  Nutritional Supplements (ANTI-OXIDANT COMPLEX PO) Take 10 mg by mouth daily.     Historical Provider, MD  OVER [name] COUNTER MEDICATION Take 1 scoop by mouth 2 (two) times daily. Raw fiber    Historical Provider, MD  OVER [name] COUNTER MEDICATION Place 1 application into both eyes 2 (two) times daily as needed. Dry eyes.  Refresh liquid gel    Historical Provider, MD  potassium chloride SA (K-DUR,KLOR-CON) 20 MEQ tablet TAKE 1 TABLETS BY MOUTH DAILY. 02/06/16   Eulas Post, MD  Probiotic Product (ALIGN) 4 MG CAPS Take by mouth daily.     Historical Provider, MD  UNABLE TO FIND Tumeric 1 qd    Historical Provider, MD  valACYclovir (VALTREX) 500 MG tablet Take 1 tablet (500 mg total) by mouth 3 (three) times daily. 11/22/13   Eulas Post, MD  vitamin B-12 (CYANOCOBALAMIN) 50 MCG tablet Take 50 mcg by mouth daily.     Historical Provider, MD    ROS:  Out of a complete 14 system review of symptoms, [name] complains only of [name] following symptoms, and all other reviewed systems are negative.  Decreased activity, fatigue Leg swelling Abdominal pain,  constipation Sleep apnea, frequent waking, daytime sleepiness Difficulty urinating, incontinence of [name] bladder, frequency of urination, urinary urgency, testicular pain Back pain, walking difficulty Skin wounds Bruising easily Memory loss, dizziness, numbness, weakness Confusion, decreased concentration  Blood pressure 136/71, pulse (!) 51, height 5\' 8"  (1.727 m), weight 144 lb 8 oz (65.5 kg).  Physical Exam  General: [name] is alert and cooperative at [name] time of [name] examination.  Skin: No significant peripheral edema is noted.   Neurologic Exam  Mental status: [name] is alert and oriented x 3 at [name] time of  [name] examination. [name] has apparent normal recent and remote memory, with an apparently normal attention span and concentration ability. Mini-Mental Status Examination done today shows a total score of 30/30.   Cranial nerves: Facial symmetry is present. Speech is normal, no aphasia or dysarthria is noted. Extraocular movements are full. Visual fields are full. Masking of [name] face is seen.   Motor: [name] has good strength in all 4 extremities.  Sensory examination: Soft touch sensation is symmetric on [name] face, arms, and legs.  Coordination: [name] has good finger-nose-finger and heel-to-shin bilaterally.  Gait and station: [name] is able to arise from a seated position with some difficulty with arms crossed. Once up, [name] has a slightly stooped posture, decreased arm swing seen bilaterally, right greater than left. [name] has no tremor. Romberg is negative, tandem gait is slightly unsteady.  Reflexes: Deep tendon reflexes are symmetric.   Assessment/Plan:  1. Mild memory disturbance   2. Parkinson's disease, new diagnosis   3. Mild gait disturbance   4. Back pain, right groin pain   [name] is developing symptoms of Parkinson's disease. He has a prior history of low back pain, his posture is now more stooped which  may be worsening [name] back pain. He has pain into [name] right groin, right flank pain, he denies any prior history of renal calculi. [name] will be set up for MRI of [name] low back, I have indicated that he needs remain active, get into a regular exercise program. He will be placed on low-dose Sinemet taking [name] 25/100 mg tablet, one half tablet twice daily. He is to look out for confusion. He will continue [name] Namenda for [name] memory. He will follow-up in 3 months.   Jill Alexanders MD 05/16/2016 10:02 AM  [facility] [address] [place], [place] [address]  Phone [phone] Fax [phone]

## 2016-05-16 NOTE — Patient Instructions (Addendum)
   Sinemet (carbidopa) may result in confusion or hallucinations, drowsiness, nausea, or dizziness. If any significant side effects are noted, please contact our office. Sinemet may not be well absorbed when taken with high protein meals, if tolerated it is best to take 30-45 minutes before you eat.  Parkinson Disease Parkinson disease is a disorder of the central nervous system, which includes the brain and spinal cord. A person with this disease slowly loses the ability to completely control body movements. Within the brain, there is a group of nerve cells (basal ganglia) that help control movement. The basal ganglia are damaged and do not work properly in a person with Parkinson disease. In addition, the basal ganglia produce and use a brain chemical called dopamine. The dopamine chemical sends messages to other parts of the body to control and coordinate body movements. Dopamine levels are low in a person with Parkinson disease. If the dopamine levels are low, then the body does not receive the correct messages it needs to move normally.  CAUSES  The exact reason why the basal ganglia get damaged is not known. Some medical researchers have thought that infection, genes, environment, and certain medicines may contribute to the cause.  SYMPTOMS   An early symptom of Parkinson disease is often an uncontrolled shaking (tremor) of the hands. The tremor will often disappear when the affected hand is consciously used.  As the disease progresses, walking, talking, getting out of a chair, and new movements become more difficult.  Muscles get stiff and movements become slower.  Balance and coordination become harder.  Depression, trouble swallowing, urinary problems, constipation, and sleep problems can occur.  Later in the disease, memory and thought processes may deteriorate. DIAGNOSIS  There are no specific tests to diagnose Parkinson disease. You may be referred to a neurologist for evaluation.  Your caregiver will ask about your medical history, symptoms, and perform a physical exam. Blood tests and imaging tests of your brain may be performed to rule out other diseases. The imaging tests may include an MRI or a CT scan. TREATMENT  The goal of treatment is to relieve symptoms. Medicines may be prescribed once the symptoms become troublesome. Medicine will not stop the progression of the disease, but medicine can make movement and balance better and help control tremors. Speech and occupational therapy may also be prescribed. Sometimes, surgical treatment of the brain can be done in young people. HOME CARE INSTRUCTIONS  Get regular exercise and rest periods during the day to help prevent exhaustion and depression.  If getting dressed becomes difficult, replace buttons and zippers with Velcro and elastic on your clothing.  Take all medicine as directed by your caregiver.  Install grab bars or railings in your home to prevent falls.  Go to speech or occupational therapy as directed.  Keep all follow-up visits as directed by your caregiver. SEEK MEDICAL CARE IF:  Your symptoms are not controlled with your medicine.  You fall.  You have trouble swallowing or choke on your food. MAKE SURE YOU:  Understand these instructions.  Will watch your condition.  Will get help right away if you are not doing well or get worse.   This information is not intended to replace advice given to you by your health care provider. Make sure you discuss any questions you have with your health care provider.   Document Released: 08/02/2000 Document Revised: 11/30/2012 Document Reviewed: 09/04/2011 Elsevier Interactive Patient Education [employer].

## 2016-05-20 DIAGNOSIS — G8929 Other chronic pain: Secondary | ICD-10-CM | POA: Diagnosis not present

## 2016-05-20 DIAGNOSIS — G609 Hereditary and idiopathic neuropathy, unspecified: Secondary | ICD-10-CM | POA: Diagnosis not present

## 2016-05-20 DIAGNOSIS — M48061 Spinal stenosis, lumbar region without neurogenic claudication: Secondary | ICD-10-CM | POA: Diagnosis not present

## 2016-05-20 DIAGNOSIS — M5136 Other intervertebral disc degeneration, lumbar region: Secondary | ICD-10-CM | POA: Diagnosis not present

## 2016-05-20 DIAGNOSIS — F419 Anxiety disorder, unspecified: Secondary | ICD-10-CM | POA: Diagnosis not present

## 2016-05-20 DIAGNOSIS — K5730 Diverticulosis of large intestine without perforation or abscess without bleeding: Secondary | ICD-10-CM | POA: Diagnosis not present

## 2016-05-20 DIAGNOSIS — M419 Scoliosis, unspecified: Secondary | ICD-10-CM | POA: Diagnosis not present

## 2016-05-20 DIAGNOSIS — I10 Essential (primary) hypertension: Secondary | ICD-10-CM | POA: Diagnosis not present

## 2016-05-20 DIAGNOSIS — G20 Parkinson's disease: Secondary | ICD-10-CM | POA: Diagnosis not present

## 2016-05-20 DIAGNOSIS — F329 Major depressive disorder, single episode, unspecified: Secondary | ICD-10-CM | POA: Diagnosis not present

## 2016-05-20 DIAGNOSIS — M1991 Primary osteoarthritis, unspecified site: Secondary | ICD-10-CM | POA: Diagnosis not present

## 2016-05-20 DIAGNOSIS — M810 Age-related osteoporosis without current pathological fracture: Secondary | ICD-10-CM | POA: Diagnosis not present

## 2016-05-20 DIAGNOSIS — M797 Fibromyalgia: Secondary | ICD-10-CM | POA: Diagnosis not present

## 2016-05-22 ENCOUNTER — Telehealth: Payer: Self-pay | Admitting: Family Medicine

## 2016-05-22 NOTE — Telephone Encounter (Signed)
OK 

## 2016-05-22 NOTE — Telephone Encounter (Signed)
Okay 

## 2016-05-22 NOTE — Telephone Encounter (Signed)
Verbal approval to continue PT for 2 x a week for 6 weeks

## 2016-05-23 NOTE — Telephone Encounter (Signed)
[name] is aware of verbal orders.

## 2016-05-27 DIAGNOSIS — F329 Major depressive disorder, single episode, unspecified: Secondary | ICD-10-CM | POA: Diagnosis not present

## 2016-05-27 DIAGNOSIS — M419 Scoliosis, unspecified: Secondary | ICD-10-CM | POA: Diagnosis not present

## 2016-05-27 DIAGNOSIS — M797 Fibromyalgia: Secondary | ICD-10-CM | POA: Diagnosis not present

## 2016-05-27 DIAGNOSIS — M48061 Spinal stenosis, lumbar region without neurogenic claudication: Secondary | ICD-10-CM | POA: Diagnosis not present

## 2016-05-27 DIAGNOSIS — M5136 Other intervertebral disc degeneration, lumbar region: Secondary | ICD-10-CM | POA: Diagnosis not present

## 2016-05-27 DIAGNOSIS — K5730 Diverticulosis of large intestine without perforation or abscess without bleeding: Secondary | ICD-10-CM | POA: Diagnosis not present

## 2016-05-27 DIAGNOSIS — F419 Anxiety disorder, unspecified: Secondary | ICD-10-CM | POA: Diagnosis not present

## 2016-05-27 DIAGNOSIS — M1991 Primary osteoarthritis, unspecified site: Secondary | ICD-10-CM | POA: Diagnosis not present

## 2016-05-27 DIAGNOSIS — M810 Age-related osteoporosis without current pathological fracture: Secondary | ICD-10-CM | POA: Diagnosis not present

## 2016-05-27 DIAGNOSIS — G609 Hereditary and idiopathic neuropathy, unspecified: Secondary | ICD-10-CM | POA: Diagnosis not present

## 2016-05-27 DIAGNOSIS — G8929 Other chronic pain: Secondary | ICD-10-CM | POA: Diagnosis not present

## 2016-05-27 DIAGNOSIS — G20 Parkinson's disease: Secondary | ICD-10-CM | POA: Diagnosis not present

## 2016-05-27 DIAGNOSIS — I10 Essential (primary) hypertension: Secondary | ICD-10-CM | POA: Diagnosis not present

## 2016-05-30 ENCOUNTER — Telehealth: Payer: Self-pay | Admitting: Neurology

## 2016-05-30 NOTE — Telephone Encounter (Signed)
Gracie[facility] called request a call back to confirm dx of Parkinson's. Please call [phone]

## 2016-05-31 NOTE — Telephone Encounter (Signed)
Returned call to [facility], verified that pt does have a new diagnosis of Parkinson's and has been started on Sinemet. Verbalized understanding and appreciation for call.

## 2016-06-03 DIAGNOSIS — I10 Essential (primary) hypertension: Secondary | ICD-10-CM | POA: Diagnosis not present

## 2016-06-03 DIAGNOSIS — M48061 Spinal stenosis, lumbar region without neurogenic claudication: Secondary | ICD-10-CM | POA: Diagnosis not present

## 2016-06-03 DIAGNOSIS — G609 Hereditary and idiopathic neuropathy, unspecified: Secondary | ICD-10-CM | POA: Diagnosis not present

## 2016-06-03 DIAGNOSIS — F419 Anxiety disorder, unspecified: Secondary | ICD-10-CM | POA: Diagnosis not present

## 2016-06-03 DIAGNOSIS — G20 Parkinson's disease: Secondary | ICD-10-CM | POA: Diagnosis not present

## 2016-06-03 DIAGNOSIS — G8929 Other chronic pain: Secondary | ICD-10-CM | POA: Diagnosis not present

## 2016-06-03 DIAGNOSIS — K5730 Diverticulosis of large intestine without perforation or abscess without bleeding: Secondary | ICD-10-CM | POA: Diagnosis not present

## 2016-06-03 DIAGNOSIS — M5136 Other intervertebral disc degeneration, lumbar region: Secondary | ICD-10-CM | POA: Diagnosis not present

## 2016-06-03 DIAGNOSIS — M1991 Primary osteoarthritis, unspecified site: Secondary | ICD-10-CM | POA: Diagnosis not present

## 2016-06-03 DIAGNOSIS — M810 Age-related osteoporosis without current pathological fracture: Secondary | ICD-10-CM | POA: Diagnosis not present

## 2016-06-03 DIAGNOSIS — F329 Major depressive disorder, single episode, unspecified: Secondary | ICD-10-CM | POA: Diagnosis not present

## 2016-06-03 DIAGNOSIS — M419 Scoliosis, unspecified: Secondary | ICD-10-CM | POA: Diagnosis not present

## 2016-06-03 DIAGNOSIS — M797 Fibromyalgia: Secondary | ICD-10-CM | POA: Diagnosis not present

## 2016-06-12 DIAGNOSIS — M5136 Other intervertebral disc degeneration, lumbar region: Secondary | ICD-10-CM | POA: Diagnosis not present

## 2016-06-12 DIAGNOSIS — M419 Scoliosis, unspecified: Secondary | ICD-10-CM | POA: Diagnosis not present

## 2016-06-12 DIAGNOSIS — M810 Age-related osteoporosis without current pathological fracture: Secondary | ICD-10-CM | POA: Diagnosis not present

## 2016-06-12 DIAGNOSIS — K5730 Diverticulosis of large intestine without perforation or abscess without bleeding: Secondary | ICD-10-CM | POA: Diagnosis not present

## 2016-06-12 DIAGNOSIS — F329 Major depressive disorder, single episode, unspecified: Secondary | ICD-10-CM | POA: Diagnosis not present

## 2016-06-12 DIAGNOSIS — F419 Anxiety disorder, unspecified: Secondary | ICD-10-CM | POA: Diagnosis not present

## 2016-06-12 DIAGNOSIS — G20 Parkinson's disease: Secondary | ICD-10-CM | POA: Diagnosis not present

## 2016-06-12 DIAGNOSIS — G609 Hereditary and idiopathic neuropathy, unspecified: Secondary | ICD-10-CM | POA: Diagnosis not present

## 2016-06-12 DIAGNOSIS — G8929 Other chronic pain: Secondary | ICD-10-CM | POA: Diagnosis not present

## 2016-06-12 DIAGNOSIS — M1991 Primary osteoarthritis, unspecified site: Secondary | ICD-10-CM | POA: Diagnosis not present

## 2016-06-12 DIAGNOSIS — M797 Fibromyalgia: Secondary | ICD-10-CM | POA: Diagnosis not present

## 2016-06-12 DIAGNOSIS — M48061 Spinal stenosis, lumbar region without neurogenic claudication: Secondary | ICD-10-CM | POA: Diagnosis not present

## 2016-06-12 DIAGNOSIS — I10 Essential (primary) hypertension: Secondary | ICD-10-CM | POA: Diagnosis not present

## 2016-06-15 ENCOUNTER — Ambulatory Visit
Admission: RE | Admit: 2016-06-15 | Discharge: 2016-06-15 | Disposition: A | Discharge status: Home / Self Care | Payer: PPO | Source: Ambulatory Visit | Attending: Neurology | Admitting: Neurology

## 2016-06-15 DIAGNOSIS — M545 Low back pain, unspecified: Secondary | ICD-10-CM

## 2016-06-17 ENCOUNTER — Telehealth: Payer: Self-pay | Admitting: Neurology

## 2016-06-17 DIAGNOSIS — G8929 Other chronic pain: Secondary | ICD-10-CM | POA: Diagnosis not present

## 2016-06-17 DIAGNOSIS — M797 Fibromyalgia: Secondary | ICD-10-CM | POA: Diagnosis not present

## 2016-06-17 DIAGNOSIS — G20 Parkinson's disease: Secondary | ICD-10-CM | POA: Diagnosis not present

## 2016-06-17 DIAGNOSIS — I10 Essential (primary) hypertension: Secondary | ICD-10-CM | POA: Diagnosis not present

## 2016-06-17 DIAGNOSIS — F419 Anxiety disorder, unspecified: Secondary | ICD-10-CM | POA: Diagnosis not present

## 2016-06-17 DIAGNOSIS — M48061 Spinal stenosis, lumbar region without neurogenic claudication: Secondary | ICD-10-CM | POA: Diagnosis not present

## 2016-06-17 DIAGNOSIS — M419 Scoliosis, unspecified: Secondary | ICD-10-CM | POA: Diagnosis not present

## 2016-06-17 DIAGNOSIS — K5730 Diverticulosis of large intestine without perforation or abscess without bleeding: Secondary | ICD-10-CM | POA: Diagnosis not present

## 2016-06-17 DIAGNOSIS — M810 Age-related osteoporosis without current pathological fracture: Secondary | ICD-10-CM | POA: Diagnosis not present

## 2016-06-17 DIAGNOSIS — G609 Hereditary and idiopathic neuropathy, unspecified: Secondary | ICD-10-CM | POA: Diagnosis not present

## 2016-06-17 DIAGNOSIS — M1991 Primary osteoarthritis, unspecified site: Secondary | ICD-10-CM | POA: Diagnosis not present

## 2016-06-17 DIAGNOSIS — F329 Major depressive disorder, single episode, unspecified: Secondary | ICD-10-CM | POA: Diagnosis not present

## 2016-06-17 DIAGNOSIS — M5136 Other intervertebral disc degeneration, lumbar region: Secondary | ICD-10-CM | POA: Diagnosis not present

## 2016-06-17 MED ORDER — GABAPENTIN 100 MG PO CAPS: 100.0000 mg | ORAL_CAPSULE | Freq: Three times a day (TID) | ORAL | 2 refills | Status: DC

## 2016-06-17 NOTE — Telephone Encounter (Signed)
  I called patient. MRI of the back shows multilevel degenerative changes, no real change from 2010. In the past, the patient had a series of 3 epidural steroid injections without benefit. We will try gabapentin in low dose, Lyrica in the past did not help much.  The patient will go up on the Sinemet taking one half tablet 3 times daily of the 25/100 mg tablet.   MRI lumbar 06/17/16:  IMPRESSION:  This MRI of the lumbar spine without contrast shows severe multilevel degenerative changes as detailed above that are essentially unchanged when compared to the 06/12/2009 MRI. There is no definite nerve root compression though there is some encroachment upon the right L4 nerve root at L4-L5.

## 2016-06-25 DIAGNOSIS — G20 Parkinson's disease: Secondary | ICD-10-CM | POA: Diagnosis not present

## 2016-06-25 DIAGNOSIS — M5136 Other intervertebral disc degeneration, lumbar region: Secondary | ICD-10-CM | POA: Diagnosis not present

## 2016-06-25 DIAGNOSIS — G8929 Other chronic pain: Secondary | ICD-10-CM | POA: Diagnosis not present

## 2016-06-25 DIAGNOSIS — G609 Hereditary and idiopathic neuropathy, unspecified: Secondary | ICD-10-CM | POA: Diagnosis not present

## 2016-06-25 DIAGNOSIS — M48061 Spinal stenosis, lumbar region without neurogenic claudication: Secondary | ICD-10-CM | POA: Diagnosis not present

## 2016-06-25 DIAGNOSIS — F419 Anxiety disorder, unspecified: Secondary | ICD-10-CM | POA: Diagnosis not present

## 2016-06-25 DIAGNOSIS — M797 Fibromyalgia: Secondary | ICD-10-CM | POA: Diagnosis not present

## 2016-06-25 DIAGNOSIS — F329 Major depressive disorder, single episode, unspecified: Secondary | ICD-10-CM | POA: Diagnosis not present

## 2016-06-25 DIAGNOSIS — K5730 Diverticulosis of large intestine without perforation or abscess without bleeding: Secondary | ICD-10-CM | POA: Diagnosis not present

## 2016-06-25 DIAGNOSIS — M1991 Primary osteoarthritis, unspecified site: Secondary | ICD-10-CM | POA: Diagnosis not present

## 2016-06-25 DIAGNOSIS — M810 Age-related osteoporosis without current pathological fracture: Secondary | ICD-10-CM | POA: Diagnosis not present

## 2016-06-25 DIAGNOSIS — I10 Essential (primary) hypertension: Secondary | ICD-10-CM | POA: Diagnosis not present

## 2016-06-25 DIAGNOSIS — M419 Scoliosis, unspecified: Secondary | ICD-10-CM | POA: Diagnosis not present

## 2016-07-02 ENCOUNTER — Other Ambulatory Visit: Payer: Self-pay

## 2016-07-02 MED ORDER — GABAPENTIN 100 MG PO CAPS: 100.0000 mg | ORAL_CAPSULE | Freq: Three times a day (TID) | ORAL | 3 refills | Status: DC

## 2016-07-02 NOTE — Telephone Encounter (Signed)
Gabapentin 90 day rx e-scribed per faxed request from pharmacy.

## 2016-07-03 ENCOUNTER — Other Ambulatory Visit: Payer: Self-pay

## 2016-07-03 MED ORDER — CARBIDOPA-LEVODOPA 25-100 MG PO TABS: 0.5000 | ORAL_TABLET | Freq: Two times a day (BID) | ORAL | 3 refills | Status: DC

## 2016-07-03 NOTE — Telephone Encounter (Signed)
E-scribed 90 day refill as requested per fax from pharmacy.

## 2016-07-29 ENCOUNTER — Ambulatory Visit: Payer: PPO | Admitting: Family Medicine

## 2016-07-30 ENCOUNTER — Encounter: Payer: Self-pay | Admitting: Family Medicine

## 2016-07-30 ENCOUNTER — Ambulatory Visit (INDEPENDENT_AMBULATORY_CARE_PROVIDER_SITE_OTHER): Payer: PPO | Admitting: Family Medicine

## 2016-07-30 VITALS — BP 130/82 | HR 45 | Temp 97.6°F | Ht 68.0 in | Wt 155.0 lb

## 2016-07-30 DIAGNOSIS — I10 Essential (primary) hypertension: Secondary | ICD-10-CM

## 2016-07-30 DIAGNOSIS — R32 Unspecified urinary incontinence: Secondary | ICD-10-CM | POA: Diagnosis not present

## 2016-07-30 DIAGNOSIS — G20A1 Parkinson's disease without dyskinesia, without mention of fluctuations: Secondary | ICD-10-CM

## 2016-07-30 DIAGNOSIS — E876 Hypokalemia: Secondary | ICD-10-CM | POA: Diagnosis not present

## 2016-07-30 DIAGNOSIS — G20 Parkinson's disease: Secondary | ICD-10-CM | POA: Diagnosis not present

## 2016-07-30 LAB — BASIC METABOLIC PANEL
BUN: 16 mg/dL (ref 6–23)
CHLORIDE: 103 meq/L (ref 96–112)
CO2: 33 meq/L — AB (ref 19–32)
Calcium: 9.2 mg/dL (ref 8.4–10.5)
Creatinine, Ser: 0.94 mg/dL (ref 0.40–1.50)
GFR: 83.96 mL/min (ref >60.00)
GLUCOSE: 61 mg/dL — AB (ref 70–99)
Potassium: 3.6 mEq/L (ref 3.5–5.1)
SODIUM: 143 meq/L (ref 135–145)

## 2016-07-30 MED ORDER — AMLODIPINE BESYLATE 5 MG PO TABS: 5.0000 mg | ORAL_TABLET | Freq: Every day | ORAL | 3 refills | Status: DC

## 2016-07-30 NOTE — Patient Instructions (Signed)
Reduce Amlodipine to 5mg daily

## 2016-07-30 NOTE — Progress Notes (Signed)
Pre visit review using our clinic review tool, if applicable. No additional management support is needed unless otherwise documented below in the visit note. 

## 2016-07-30 NOTE — Progress Notes (Signed)
Subjective:     Patient ID: [name] Brooking., male   DOB: [birth year], 70 y.o.   MRN: [id number]  HPI Patient seen for medical follow-up. He has Parkinson's disease in his recent started on Sinemet. His other problems include history of chronic low back pain, hypertension, high risk for falls, history of hypogonadism, hyperlipidemia.  He recent MRI lumbar spine per neurology which showed severe degenerative disease in his back. He has ongoing back pains. He was started recently on Sinemet as well as gabapentin low-dose 100 mg 3 times a day. He has occasional lightheadedness but no syncope. Is on several blood pressure medications including amlodipine, losartan, and metoprolol. Not monitoring blood pressures regularly at home.  He complains of frequent urinary incontinence. This occurs mostly at night. He denies any urgency. He states he wakes up and has already urinated and sometimes is happens is much as 2-3 times per night. He wears depends at night. Denies any perianal anesthesia. No stool incontinence.  He has history of recurrent hypokalemia. He does not take any diuretics. Last potassium was finally back up to 3.6 and he remains on supplement  Past Medical History:  Diagnosis Date  . Abdominal pain, right lower quadrant   . Abnormality of gait 05/16/2016  . Acute prostatitis   . Anxiety   . Constipation   . Degenerative disc disease   . Depression   . Diverticulosis of colon (without mention of hemorrhage)   . Essential and other specified forms of tremor   . Family history of malignant neoplasm of gastrointestinal tract   . Fibromyalgia   . Ganglion and cyst of synovium, tendon, and bursa   . GERD (gastroesophageal reflux disease)   . Hemarthrosis, upper arm   . Hyperlipidemia   . Hypersomnia with sleep apnea, unspecified   . Hypertension   . Hypopotassemia   . Localized osteoarthrosis not specified whether primary or secondary, lower leg   . Lumbago   . Memory difficulties  04/22/2013  . Nocturia   . Osteoporosis, unspecified   . Other testicular hypofunction   . Parkinson disease ([place]) 05/16/2016  . Right inguinal hernia   . Sleep apnea    last sleep study 11/11 on chart- Bipap with settings of 4 per  patient  . Spinal stenosis, lumbar region, without neurogenic claudication   . Syncope and collapse   . Unspecified adverse effect of unspecified drug, medicinal and biological substance   . Unspecified gastritis and gastroduodenitis without mention of hemorrhage   . Urinary frequency    Past Surgical History:  Procedure Laterality Date  . BACK SURGERY     x 3  . [place]  . INGUINAL HERNIA REPAIR  07/19/2011   Procedure: LAPAROSCOPIC INGUINAL HERNIA;  Surgeon: Odis Hollingshead, MD;  Location: [facility];  Service: General;  Laterality: Right;  Laparoscopic Repair of Recurrent Right  Ingunial Hernia with Mesh  . microdisectomy  03/02/2003, 06/29/2003, 12/24/2005  . TONSILLECTOMY      reports that he has never smoked. He has never used smokeless tobacco. He reports that he does not drink alcohol or use drugs. family history includes Colon cancer in his mother; Dementia in his mother; Lung cancer in his paternal grandfather; Parkinsonism (age of onset: 4) in his father. Allergies  Allergen Reactions  . Nucynta [Tapentadol] Other (See Comments)    dizziness  . Pregabalin     REACTION: dizziness and mental status changes  . Exelon [Rivastigmine]  abd pain     Review of Systems  Constitutional: Positive for fatigue.  Eyes: Negative for visual disturbance.  Respiratory: Negative for cough, chest tightness and shortness of breath.   Cardiovascular: Negative for chest pain, palpitations and leg swelling.  Genitourinary: Negative for hematuria.  Neurological: Positive for dizziness, weakness and light-headedness. Negative for syncope and headaches.       Objective:   Physical Exam  Constitutional: He is oriented to person, place, and  time. He appears well-developed and well-nourished.  HENT:  Right Ear: External ear normal.  Left Ear: External ear normal.  Mouth/Throat: Oropharynx is clear and moist.  Eyes: Pupils are equal, round, and reactive to light.  Neck: Neck supple. No thyromegaly present.  Cardiovascular: Normal rate and regular rhythm.   Pulmonary/Chest: Effort normal and breath sounds normal. No respiratory distress. He has no wheezes. He has no rales.  Musculoskeletal: He exhibits edema.  Trace edema ankles bilaterally  Neurological: He is alert and oriented to person, place, and time.       Assessment:     #1 history of hypertension. Blood pressure today repeat left arm seated 124/62 and standing 102/60.  Recently started Sinemet which may be contributing to some mild orthostatic symptoms  #2 history of recurrent hypokalemia  #3 Parkinson's disease  #4 chronic low back pain  #5 urine incontinence. Question next type. May have some urgency as well as functional incontinence.    Plan:     -Reduce amlodipine to 5 mg daily -Reassess blood pressure in one month and continue gradual reduction blood pressure medications if indicated -Consider urology referral to help evaluate his incontinence -stay well hydrated  -repeat BMP today.  Eulas Post MD [facility]

## 2016-08-22 ENCOUNTER — Encounter: Payer: Self-pay | Admitting: Neurology

## 2016-08-22 ENCOUNTER — Ambulatory Visit (INDEPENDENT_AMBULATORY_CARE_PROVIDER_SITE_OTHER): Payer: PPO | Admitting: Neurology

## 2016-08-22 VITALS — BP 176/76 | HR 45 | Ht 68.0 in | Wt 153.8 lb

## 2016-08-22 DIAGNOSIS — G8929 Other chronic pain: Secondary | ICD-10-CM | POA: Diagnosis not present

## 2016-08-22 DIAGNOSIS — M545 Low back pain, unspecified: Secondary | ICD-10-CM

## 2016-08-22 DIAGNOSIS — R269 Unspecified abnormalities of gait and mobility: Secondary | ICD-10-CM

## 2016-08-22 DIAGNOSIS — G20 Parkinson's disease: Secondary | ICD-10-CM | POA: Diagnosis not present

## 2016-08-22 DIAGNOSIS — R413 Other amnesia: Secondary | ICD-10-CM | POA: Diagnosis not present

## 2016-08-22 DIAGNOSIS — G20A1 Parkinson's disease without dyskinesia, without mention of fluctuations: Secondary | ICD-10-CM

## 2016-08-22 MED ORDER — CARBIDOPA-LEVODOPA 25-100 MG PO TABS: 1.0000 | ORAL_TABLET | Freq: Three times a day (TID) | ORAL | 1 refills | Status: DC

## 2016-08-22 NOTE — Patient Instructions (Signed)
   Sinemet (carbidopa) may result in confusion or hallucinations, drowsiness, nausea, or dizziness. If any significant side effects are noted, please contact our office. Sinemet may not be well absorbed when taken with high protein meals, if tolerated it is best to take 30-45 minutes before you eat.

## 2016-08-22 NOTE — Progress Notes (Signed)
Reason for visit: Parkinson's disease  [name]. is an 71 y.o. male  History of present illness:  [name] is a 71 year old right-handed white male with a history of Parkinson's disease. The patient has had gradual worsening of his clinical syndrome while on low-dose Sinemet taking the 25/100 mg tablets, one half tablet 3 times daily. The patient has chronic low back pain and he has no sciatica pain down the legs. The patient has not responded to epidural steroid injections in the past. The patient is on gabapentin without much benefit. The patient has had one fall since last seen. He denies any tremor. He denies problems with swallowing. He does not have back pain if he is sitting and not moving, but as soon as he stands up the pain will ensue. He returns to this office for an evaluation.  Past Medical History:  Diagnosis Date  . Abdominal pain, right lower quadrant   . Abnormality of gait 05/16/2016  . Acute prostatitis   . Anxiety   . Constipation   . Degenerative disc disease   . Depression   . Diverticulosis of colon (without mention of hemorrhage)   . Essential and other specified forms of tremor   . Family history of malignant neoplasm of gastrointestinal tract   . Fibromyalgia   . Ganglion and cyst of synovium, tendon, and bursa   . GERD (gastroesophageal reflux disease)   . Hemarthrosis, upper arm   . Hyperlipidemia   . Hypersomnia with sleep apnea, unspecified   . Hypertension   . Hypopotassemia   . Localized osteoarthrosis not specified whether primary or secondary, lower leg   . Lumbago   . Memory difficulties 04/22/2013  . Nocturia   . Osteoporosis, unspecified   . Other testicular hypofunction   . Parkinson disease ([place]) 05/16/2016  . Right inguinal hernia   . Sleep apnea    last sleep study 11/11 on chart- Bipap with settings of 4 per  patient  . Spinal stenosis, lumbar region, without neurogenic claudication   . Syncope and collapse   . Unspecified  adverse effect of unspecified drug, medicinal and biological substance   . Unspecified gastritis and gastroduodenitis without mention of hemorrhage   . Urinary frequency     Past Surgical History:  Procedure Laterality Date  . BACK SURGERY     x 3  . [place]  . INGUINAL HERNIA REPAIR  07/19/2011   Procedure: LAPAROSCOPIC INGUINAL HERNIA;  Surgeon: Odis Hollingshead, MD;  Location: [facility];  Service: General;  Laterality: Right;  Laparoscopic Repair of Recurrent Right  Ingunial Hernia with Mesh  . microdisectomy  03/02/2003, 06/29/2003, 12/24/2005  . TONSILLECTOMY      Family History  Problem Relation Age of Onset  . Colon cancer Mother   . Dementia Mother   . Parkinsonism Father 51  . Lung cancer Paternal Grandfather     smoker  . Liver disease Neg Hx   . Kidney disease Neg Hx   . Esophageal cancer Neg Hx     Social history:  reports that he has never smoked. He has never used smokeless tobacco. He reports that he does not drink alcohol or use drugs.    Allergies  Allergen Reactions  . Nucynta [Tapentadol] Other (See Comments)    dizziness  . Pregabalin     REACTION: dizziness and mental status changes  . Exelon [Rivastigmine]     abd pain    Medications:  Prior to Admission medications   Medication Sig Start Date End Date Taking? Authorizing Provider  amLODipine (NORVASC) 5 MG tablet Take 1 tablet (5 mg total) by mouth daily. 07/30/16  Yes Eulas Post, MD  Calcium Carbonate-Vitamin D (CALCIUM 600 + D PO) Take 1 tablet by mouth 2 (two) times daily.    Yes Historical Provider, MD  carbidopa-levodopa (SINEMET IR) 25-100 MG tablet Take 0.5 tablets by mouth 2 (two) times daily. 07/03/16  Yes Kathrynn Ducking, MD  cholecalciferol (VITAMIN D) 1000 UNITS tablet Take 5,000 Units by mouth daily.    Yes Historical Provider, MD  citalopram (CELEXA) 40 MG tablet TAKE 1 TABLET EVERY MORNING 04/29/16  Yes Eulas Post, MD  Coenzyme Q10 200 MG capsule Take  200 mg by mouth daily.    Yes Historical Provider, MD  gabapentin (NEURONTIN) 100 MG capsule Take 1 capsule (100 mg total) by mouth 3 (three) times daily. 07/02/16  Yes Kathrynn Ducking, MD  Krill Oil 1000 MG CAPS Take 1 capsule by mouth 2 (two) times daily.   Yes Historical Provider, MD  losartan (COZAAR) 100 MG tablet TAKE 1 TABLET (100 MG TOTAL) BY MOUTH DAILY. 05/14/16  Yes Eulas Post, MD  MAGNESIUM CITRATE PO Take 200 mg by mouth 2 (two) times daily.    Yes Historical Provider, MD  memantine (NAMENDA) 10 MG tablet TAKE 1 TABLET (10 MG TOTAL) BY MOUTH 2 (TWO) TIMES DAILY. 11/10/15  Yes Kathrynn Ducking, MD  metoprolol tartrate (LOPRESSOR) 25 MG tablet Take one tablet once per day. 03/20/16  Yes Eulas Post, MD  Nutritional Supplements (ANTI-OXIDANT COMPLEX PO) Take 10 mg by mouth daily.    Yes Historical Provider, MD  potassium chloride SA (K-DUR,KLOR-CON) 20 MEQ tablet TAKE 1 TABLETS BY MOUTH DAILY. 02/06/16  Yes Eulas Post, MD  Probiotic Product (ALIGN) 4 MG CAPS Take by mouth daily.    Yes Historical Provider, MD  vitamin B-12 (CYANOCOBALAMIN) 50 MCG tablet Take 50 mcg by mouth daily.    Yes Historical Provider, MD    ROS:  Out of a complete 14 system review of symptoms, the patient complains only of the following symptoms, and all other reviewed systems are negative.  Decreased activity, fatigue Constipation Daytime sleepiness Incontinence the bladder, frequency of urination, urinary urgency Joint pain, back pain, achy muscles, walking difficulty, neck pain Bruising easily Dizziness, weakness Confusion, decreased concentration  Blood pressure (!) 176/76, pulse (!) 45, height 5\' 8"  (1.727 m), weight 153 lb 12 oz (69.7 kg).  Physical Exam  General: The patient is alert and cooperative at the time of the examination.  Skin: No significant peripheral edema is noted.   Neurologic Exam  Mental status: The patient is alert and oriented x 3 at the time of the  examination. The patient has apparent normal recent and remote memory, with an apparently normal attention span and concentration ability.   Cranial nerves: Facial symmetry is present. Speech is normal, no aphasia or dysarthria is noted. Extraocular movements are full. Visual fields are full. Masking of the face is seen. Decreased eye blink is noted.  Motor: The patient has good strength in all 4 extremities.  Sensory examination: Soft touch sensation is symmetric on the face, arms, and legs.  Coordination: The patient has good finger-nose-finger and heel-to-shin bilaterally.  Gait and station: The patient is able to slowly arise from a seated position with arms crossed. Once up, the patient walks with a stooped posture, he has  decreased arm swing, he has some hesitation with turns. Tandem gait is normal. Romberg is negative. No drift is seen.  Reflexes: Deep tendon reflexes are symmetric.   Assessment/Plan:  1. Parkinson's disease  2. Chronic low back pain  3. Mild memory disturbance  The patient is having ongoing issues with parkinsonism. He recently has been found to have a 40 point drop in blood pressure from sitting to standing, his amlodipine was reduced from 10 mg to 5 mg daily. The patient will be increased on Sinemet taking the 25/100 mg tablet 3 times daily. If no response from the Sinemet is noted with higher doses, the patient may have a Parkinson's plus syndrome rather than true Parkinson's syndrome. He will follow-up in about 4 months. The patient will be set up for physical therapy to work on gait and low back pain. The patient will be given a prescription for a lumbar support device.  Jill Alexanders MD 08/22/2016 12:35 PM  [facility] [address] [place], [place] [address]  Phone [phone] Fax [phone]

## 2016-08-27 ENCOUNTER — Ambulatory Visit: Payer: PPO | Attending: Neurology | Admitting: Physical Therapy

## 2016-08-27 DIAGNOSIS — R2689 Other abnormalities of gait and mobility: Secondary | ICD-10-CM | POA: Insufficient documentation

## 2016-08-27 DIAGNOSIS — R29818 Other symptoms and signs involving the nervous system: Secondary | ICD-10-CM | POA: Diagnosis not present

## 2016-08-27 DIAGNOSIS — R293 Abnormal posture: Secondary | ICD-10-CM | POA: Diagnosis not present

## 2016-08-27 DIAGNOSIS — R2681 Unsteadiness on feet: Secondary | ICD-10-CM | POA: Diagnosis not present

## 2016-08-27 NOTE — Therapy (Signed)
[facility] [address] [place], [place], [address] Phone: [phone]   Fax:  [phone]  Physical Therapy Evaluation  Patient Details  Name: [name]. MRN: [id number] Date of Birth: [birth year] Referring Provider: Jannifer Franklin  Encounter Date: 08/27/2016      PT End of Session - 08/27/16 2143    Visit Number 1   Number of Visits 18   Date for PT Re-Evaluation 10/26/16   Authorization Type [employer] every 10th visit   PT Start Time 0932   PT Stop Time 1017   PT Time Calculation (min) 45 min   Activity Tolerance Patient tolerated treatment well   Behavior During Therapy [facility] for tasks assessed/performed      Past Medical History:  Diagnosis Date  . Abdominal pain, right lower quadrant   . Abnormality of gait 05/16/2016  . Acute prostatitis   . Anxiety   . Constipation   . Degenerative disc disease   . Depression   . Diverticulosis of colon (without mention of hemorrhage)   . Essential and other specified forms of tremor   . Family history of malignant neoplasm of gastrointestinal tract   . Fibromyalgia   . Ganglion and cyst of synovium, tendon, and bursa   . GERD (gastroesophageal reflux disease)   . Hemarthrosis, upper arm   . Hyperlipidemia   . Hypersomnia with sleep apnea, unspecified   . Hypertension   . Hypopotassemia   . Localized osteoarthrosis not specified whether primary or secondary, lower leg   . Lumbago   . Memory difficulties 04/22/2013  . Nocturia   . Osteoporosis, unspecified   . Other testicular hypofunction   . Parkinson disease ([place]) 05/16/2016  . Right inguinal hernia   . Sleep apnea    last sleep study 11/11 on chart- Bipap with settings of 4 per  patient  . Spinal stenosis, lumbar region, without neurogenic claudication   . Syncope and collapse   . Unspecified adverse effect of unspecified drug, medicinal and biological substance   . Unspecified gastritis and  gastroduodenitis without mention of hemorrhage   . Urinary frequency     Past Surgical History:  Procedure Laterality Date  . BACK SURGERY     x 3  . [place]  . INGUINAL HERNIA REPAIR  07/19/2011   Procedure: LAPAROSCOPIC INGUINAL HERNIA;  Surgeon: Odis Hollingshead, MD;  Location: [facility];  Service: General;  Laterality: Right;  Laparoscopic Repair of Recurrent Right  Ingunial Hernia with Mesh  . microdisectomy  03/02/2003, 06/29/2003, 12/24/2005  . TONSILLECTOMY      There were no vitals filed for this visit.       Subjective Assessment - 08/27/16 0939    Subjective Pt has had degenerative disc disease "since I was a teenager".  Has had 3 back surgeries.  Has back pain and Parkinson's disease, which was diagnosed approximately 1 month ago.  Feels like I have lead-soled shoes on, like feet are glued to the floor. Pt has not had a fall in the past 6 months, but has lost balance and had near falls several times.   Pertinent History degenerative disc disease, scoliosis, Parkinson's disease recent diagnosis in past 6 months, neuropathy   Patient Stated Goals Pt's goal for therapy is to help with pain and improve better balance and getting up from chairs.   Currently in Pain? Yes   Pain Score 4    Pain Location Back   Pain Orientation Lower;Right  Pain Descriptors / Indicators Aching   Pain Type Chronic pain   Pain Onset More than a month ago   Pain Frequency Constant   Aggravating Factors  trying to get out of bed, various position changes cause sharp pain   Pain Relieving Factors sitting and stretching alleviate            [facility] PT Assessment - 08/27/16 0001      Assessment   Medical Diagnosis Parkinson's disease, low back pain   Referring Provider Jannifer Franklin   Onset Date/Surgical Date 08/22/16  MD visit     Precautions   Precautions Fall  Has not been driving in past 3 months     Balance Screen   Has the patient fallen in the past 6 months No  1 fall  going up 2 steps, > 1year ago   Has the patient had a decrease in activity level because of a fear of falling?  No   Is the patient reluctant to leave their home because of a fear of falling?  No     Home Social worker Private residence   Living Arrangements Alone   Available Help at Discharge Neighbor  Son lives in [place], checks in several times/wk   Type of [place] to enter   Entrance Stairs-Number of Steps 2   Entrance Stairs-Rails None   Home Layout Two level;Able to live on main level with bedroom/bathroom   [place] - single point     Prior Function   Level of Independence Independent with household mobility without device;Independent with basic ADLs;Independent with community mobility without device  Reports increased difficulty with dressing, bed mobility   Leisure Enjoyed Yoga and Pilates (several years ago)     Posture/Postural Control   Posture/Postural Control Postural limitations   Postural Limitations Rounded Shoulders;Forward head;Increased lumbar lordosis;Posterior pelvic tilt     Tone   Assessment Location --  Slightly increased tone RLE vs LLE with P/ROM     ROM / Strength   AROM / PROM / Strength AROM;Strength     Strength   Overall Strength Comments Grossly tested at least 4/5 bilateral lower extremities     Transfers   Transfers Sit to Stand;Stand to Sit   Sit to Stand 6: Modified independent (Device/Increase time);Without upper extremity assist;From chair/3-in-1   Five time sit to stand comments  26.09   Stand to Sit 6: Modified independent (Device/Increase time);Without upper extremity assist;To chair/3-in-1     Ambulation/Gait   Ambulation/Gait Yes   Ambulation/Gait Assistance 6: Modified independent (Device/Increase time)   Ambulation Distance (Feet) 200 Feet   Assistive device None   Gait Pattern Step-through pattern;Decreased arm swing - right;Decreased step length - right;Decreased  dorsiflexion - right;Decreased trunk rotation;Trunk flexed;Poor foot clearance - right   Ambulation Surface Level;Indoor   Gait velocity 11.45 sec = 2.86 ft/sec   Gait Comments Pt reports R side tends to "stick" to floor more     Standardized Balance Assessment   Standardized Balance Assessment Timed Up and Go Test     Timed Up and Go Test   Normal TUG (seconds) 17.06   Manual TUG (seconds) 17.03   Cognitive TUG (seconds) 18.87   TUG Comments Score >13.5-15 sec indicates increased fall risk     Functional Gait  Assessment   Gait assessed  Yes   Gait Level Surface Walks 20 ft, slow speed, abnormal gait pattern, evidence for imbalance or deviates 10-15  in outside of the 12 in walkway width. Requires more than 7 sec to ambulate 20 ft.  7.36   Change in Gait Speed Able to change speed, demonstrates mild gait deviations, deviates 6-10 in outside of the 12 in walkway width, or no gait deviations, unable to achieve a major change in velocity, or uses a change in velocity, or uses an assistive device.   Gait with Horizontal Head Turns Performs head turns smoothly with slight change in gait velocity (eg, minor disruption to smooth gait path), deviates 6-10 in outside 12 in walkway width, or uses an assistive device.   Gait with Vertical Head Turns Performs task with slight change in gait velocity (eg, minor disruption to smooth gait path), deviates 6 - 10 in outside 12 in walkway width or uses assistive device   Gait and Pivot Turn Pivot turns safely within 3 sec and stops quickly with no loss of balance.  2.21 sec   Step Over Obstacle Is able to step over one shoe box (4.5 in total height) but must slow down and adjust steps to clear box safely. May require verbal cueing.   Gait with Narrow Base of Support Ambulates less than 4 steps heel to toe or cannot perform without assistance.   Gait with Eyes Closed Walks 20 ft, slow speed, abnormal gait pattern, evidence for imbalance, deviates 10-15 in  outside 12 in walkway width. Requires more than 9 sec to ambulate 20 ft.  11.3   Ambulating Backwards Walks 20 ft, slow speed, abnormal gait pattern, evidence for imbalance, deviates 10-15 in outside 12 in walkway width.  22.96   Steps Alternating feet, must use rail.   Total Score 15   FGA comment: Scores <22/30 indicate increased fall risk                             PT Short Term Goals - 08/27/16 2156      PT SHORT TERM GOAL #1   Title Pt will be independent with HEP for improved transfers, balance and mobility.  TARGET 09/28/15   Time 5   Period Weeks   Status New     PT SHORT TERM GOAL #2   Title Pt will improve 5x sit<>stand to less than or equal to 20 seconds for decreased fall risk.   Time 5   Period Weeks   Status New     PT SHORT TERM GOAL #3   Title Pt will improve TUG score to less than or equal to 13.5 seconds for decreased fall risk.   Time 5   Period Weeks   Status New     PT SHORT TERM GOAL #4   Title Pt will verbalize/demonstrate understanding of body mechanics and positioning, for decreased pain with functional mobility.   Time 5   Period Weeks   Status New     PT SHORT TERM GOAL #5   Title Pt will verbalize understanding of local Parkinson's resources.   Time 5   Period Weeks   Status New           PT Long Term Goals - 08/27/16 2204      PT LONG TERM GOAL #1   Title Pt will verbalize understanding of fall prevention/tips to reduce freezing episodes of gait.  TARGET 10/26/16   Time 9   Period Weeks   Status New     PT LONG TERM GOAL #2   Title Pt will  improve 5x sit<>stand to less than or equal to 15 seconds for decreased fall risk.    Time 9   Period Weeks   Status New     PT LONG TERM GOAL #3   Title Pt will improve TUG cognitive to less than or equal to 15 seconds for decreased fall risk.   Time 9   Period Weeks   Status New     PT LONG TERM GOAL #4   Title Pt will report at least 50% improvement in bed  mobility.   Time 9   Period Weeks   Status New     PT LONG TERM GOAL #5   Title Pt will ambulate at least 1000 ft. indoor and outdoor surfaces, without loss of balance, for improved outdoor and community gait..   Time 9   Period Weeks   Status New     Additional Long Term Goals   Additional Long Term Goals Yes     PT LONG TERM GOAL #6   Title Pt will improve Functional Gait Assessment to at least 20/56 for decreased fall risk.   Time 9   Period Weeks   Status New     PT LONG TERM GOAL #7   Title Pt will verbalize plans for continued community fitness upon D/C from PT.   Time 9   Period Weeks   Status New               Plan - 09-07-16 November 28, 2142    Clinical Impression Statement Pt is a 71 year old male who presents to OP PT with recent diagnosis of Parkinson's disease, with low back pain.  Pt presents with bradykinesia, postural abnormality, decreased timing and coordination of gait, decreased balance, pain limiting functional mobility, with reported freezing of gait at times.  Pt 's back pain and decreased mobility limits bed mobility, transfers, ease of gait.  Pt would benefit from skilled physical therapy to address the above stated deficits for decreased fall risk and improved functional mobility.   Rehab Potential Good   PT Frequency 2x / week  1x/wk for 1 wk, then   PT Duration 8 weeks   PT Treatment/Interventions ADLs/Self Care Home Management;Functional mobility training;Gait training;Therapeutic activities;Therapeutic exercise;Balance training;Neuromuscular re-education;Patient/family education;Manual techniques;Electrical Stimulation   PT Next Visit Plan Initiate low back flexibility exercises in supine/work on bed mobility, transfers; progress to posture exercises, PWR!Moves in sitting/standing/supine as patient able to tolerate   Consulted and Agree with Plan of Care Patient      Patient will benefit from skilled therapeutic intervention in order to improve the  following deficits and impairments:  Abnormal gait, Decreased balance, Decreased cognition, Decreased mobility, Decreased strength, Difficulty walking, Postural dysfunction, Pain  Visit Diagnosis: Other abnormalities of gait and mobility  Unsteadiness on feet  Abnormal posture  Other symptoms and signs involving the nervous system      G-Codes - 2016/09/07 11/28/2207    Functional Assessment Tool Used TUG 17.06 sec, TUG manual 17.03 sec, TUG cog 18.87 sec; 5x sit<>stand 26.09 sec, Functional Gait Assessment 15/30   Functional Limitation Mobility: Walking and moving around   Mobility: Walking and Moving Around Current Status [phone]) At least 40 percent but less than 60 percent impaired, limited or restricted   Mobility: Walking and Moving Around Goal Status [phone]) At least 1 percent but less than 20 percent impaired, limited or restricted       Problem List Patient Active Problem List   Diagnosis Date Noted  .  Parkinson disease ([place]) 05/16/2016  . Abnormality of gait 05/16/2016  . Knee pain 11/01/2013  . Traumatic bursitis 11/01/2013  . Hx of fall 11/01/2013  . Memory difficulties 04/22/2013  . Hemorrhoids 02/24/2013  . PRIMARY CENTRAL SLEEP APNEA 05/21/2010  . NOCTURIA 05/21/2010  . HYPERSOMNIA, ASSOCIATED WITH SLEEP APNEA 03/26/2010  . ABDOMINAL PAIN RIGHT LOWER QUADRANT 03/20/2010  . TREMOR, ESSENTIAL 11/22/2009  . SPINAL STENOSIS, LUMBAR 06/07/2009  . OSTEOARTHRITIS, LOWER LEG, LEFT 05/16/2009  . MYOFASCIAL PAIN SYNDROME 05/16/2009  . URINARY FREQUENCY, CHRONIC 02/09/2009  . DISUSE OSTEOPOROSIS 09/07/2008  . HYPOGONADISM, MALE 07/01/2008  . HYPOKALEMIA 04/19/2008  . SYNCOPE 04/19/2008  . UNS ADVRS EFF UNS RX MEDICINAL&BIOLOGICAL SBSTNC 02/26/2008  . CONSTIPATION, CHRONIC 11/23/2007  . HYPERLIPIDEMIA 05/15/2007  . Right-sided low back pain without sciatica 05/15/2007  . Osteoporosis 05/15/2007  . DIVERTICULOSIS, COLON 02/23/2007  . Essential hypertension 02/12/2007  .  GERD 02/12/2007  . GASTRITIS 02/12/2007    Aeisha Minarik W. 08/27/2016, 10:15 PM [facility] Butt., PT [facility] [address] [place], [place], [address] Phone: [phone]   Fax:  [phone]  Name: [name]. MRN: [id number] Date of Birth: 02/11/1946

## 2016-08-29 ENCOUNTER — Ambulatory Visit: Payer: PPO | Admitting: Physical Therapy

## 2016-08-29 DIAGNOSIS — R2689 Other abnormalities of gait and mobility: Secondary | ICD-10-CM

## 2016-08-30 ENCOUNTER — Encounter: Payer: Self-pay | Admitting: Family Medicine

## 2016-08-30 ENCOUNTER — Telehealth: Payer: Self-pay | Admitting: Neurology

## 2016-08-30 ENCOUNTER — Ambulatory Visit (INDEPENDENT_AMBULATORY_CARE_PROVIDER_SITE_OTHER): Payer: PPO | Admitting: Family Medicine

## 2016-08-30 VITALS — BP 178/90 | HR 46 | Temp 98.0°F | Ht 68.0 in | Wt 154.0 lb

## 2016-08-30 DIAGNOSIS — I10 Essential (primary) hypertension: Secondary | ICD-10-CM | POA: Diagnosis not present

## 2016-08-30 NOTE — Progress Notes (Signed)
Pre visit review using our clinic review tool, if applicable. No additional management support is needed unless otherwise documented below in the visit note. 

## 2016-08-30 NOTE — Telephone Encounter (Signed)
I called the patient, talk with the son. The patient has had some confusion on the Sinemet.  He is to back down to one half tablet 3 times a day for 2 weeks, then go to 1 tablet in the morning and half tablet at midday and evening for 2 weeks, then take 1 tablet in the morning and evening, half tablet at midday for 2 weeks, then go back to 1 full tablet 3 times daily.  The patient has had increased discomfort, he is on low-dose gabapentin taking 100 mg 3 times daily, he could potentially increase that dose to a 200 mg 3 times daily dose.

## 2016-08-30 NOTE — Progress Notes (Signed)
Subjective:     Patient ID: [name] Brooking., male   DOB: [birth year], 71 y.o.   MRN: [id number]  HPI Patient is here for one-month follow-up. He had orthostatic changes on recent visit and we reduced his amlodipine to 5 mg daily. He has question of Parkinson's disease with recent increase in Sinemet dosage. He feels he had more lightheadedness and possibly increased confusion since increasing the dose. He plans to follow-up with neurology soon. They have not been monitoring blood pressures closely over the past few weeks. He had recent sitting blood pressure 124/62 and standing 102/60.  Current blood pressure medications include amlodipine, losartan, metoprolol. He has increased fatigue which has been chronic  Past Medical History:  Diagnosis Date  . Abdominal pain, right lower quadrant   . Abnormality of gait 05/16/2016  . Acute prostatitis   . Anxiety   . Constipation   . Degenerative disc disease   . Depression   . Diverticulosis of colon (without mention of hemorrhage)   . Essential and other specified forms of tremor   . Family history of malignant neoplasm of gastrointestinal tract   . Fibromyalgia   . Ganglion and cyst of synovium, tendon, and bursa   . GERD (gastroesophageal reflux disease)   . Hemarthrosis, upper arm   . Hyperlipidemia   . Hypersomnia with sleep apnea, unspecified   . Hypertension   . Hypopotassemia   . Localized osteoarthrosis not specified whether primary or secondary, lower leg   . Lumbago   . Memory difficulties 04/22/2013  . Nocturia   . Osteoporosis, unspecified   . Other testicular hypofunction   . Parkinson disease ([place]) 05/16/2016  . Right inguinal hernia   . Sleep apnea    last sleep study 11/11 on chart- Bipap with settings of 4 per  patient  . Spinal stenosis, lumbar region, without neurogenic claudication   . Syncope and collapse   . Unspecified adverse effect of unspecified drug, medicinal and biological substance   . Unspecified gastritis  and gastroduodenitis without mention of hemorrhage   . Urinary frequency    Past Surgical History:  Procedure Laterality Date  . BACK SURGERY     x 3  . [place]  . INGUINAL HERNIA REPAIR  07/19/2011   Procedure: LAPAROSCOPIC INGUINAL HERNIA;  Surgeon: Odis Hollingshead, MD;  Location: [facility];  Service: General;  Laterality: Right;  Laparoscopic Repair of Recurrent Right  Ingunial Hernia with Mesh  . microdisectomy  03/02/2003, 06/29/2003, 12/24/2005  . TONSILLECTOMY      reports that he has never smoked. He has never used smokeless tobacco. He reports that he does not drink alcohol or use drugs. family history includes Colon cancer in his mother; Dementia in his mother; Lung cancer in his paternal grandfather; Parkinsonism (age of onset: 43) in his father. Allergies  Allergen Reactions  . Nucynta [Tapentadol] Other (See Comments)    dizziness  . Pregabalin     REACTION: dizziness and mental status changes  . Exelon [Rivastigmine]     abd pain     Review of Systems  Constitutional: Positive for fatigue.  Eyes: Negative for visual disturbance.  Respiratory: Negative for cough, chest tightness and shortness of breath.   Cardiovascular: Negative for chest pain, palpitations and leg swelling.  Neurological: Positive for dizziness and light-headedness. Negative for syncope and headaches.       Objective:   Physical Exam  Constitutional: He appears well-developed and well-nourished.  Cardiovascular: Normal rate  and regular rhythm.   Pulmonary/Chest: Effort normal and breath sounds normal. No respiratory distress. He has no wheezes. He has no rales.  Musculoskeletal: He exhibits no edema.       Assessment:     Hypertension. Stable. He had initial elevated reading here but standing blood pressure after rest 140/80    Plan:     -Continue current regimen -Monitor blood pressures at home and be in touch if consistently greater than 0000000 systolic -We discussed  the importance of changing positions gradually  Eulas Post MD [facility] at [facility]

## 2016-08-30 NOTE — Telephone Encounter (Signed)
Patients son [name] is calling in reference to carbidopa-levodopa (SINEMET IR) 25-100 MG tablet.  Patient is having increased confusion, and dizziness. Please call

## 2016-08-30 NOTE — Patient Instructions (Addendum)
"  Let's Twist" Stretch    With both legs bent and feet flat on floor, cross one leg over. Keeping both shoulders on floor, roll crossed legs as far as possible without pain. Repeat with other leg. Repeat ___2_ times. Do _1-2___ sessions per day.  (LEGS NOT CROSSED - gave trunk rotation picture from back packet) -- HOLD 30 secs  http://gt2.exer.us/830   Copyright  VHI. All rights reserved.  Knee to Chest    Lying supine, bend involved knee to chest _2__ times. Repeat with other leg. Do _2__ times per day.  HOLD 20-30 secs  Copyright  VHI. All rights reserved.  Double Knee to Chest (Flexion)    Gently pull both knees toward chest. Feel stretch in lower back or buttock area. Breathing deeply, Hold __20-30__ seconds. Repeat __2_ times. Do ___1-2_ sessions per day.  http://gt2.exer.us/227   Copyright  VHI. All rights reserved.  Bracing With Bridging (Hook-Lying)    With neutral spine, tighten pelvic floor and abdominals and hold. Lift bottom. Repeat _10__ times. Do _1__ times a day.   Pelvic Tilt (Flexion)    With feet flat and knees bent, flatten lower back into bed. Tighten stomach muscles. Hold _3-5___ seconds. Repeat _10___ times. Do _1_ sessions per day.  http://gt2.exer.us/229   Copyright  VHI. All rights reserved.  Marland Kitchen

## 2016-08-30 NOTE — Patient Instructions (Signed)
Monitor blood pressure several times weekly Be in touch if standing BP consistently > 140/90.

## 2016-08-30 NOTE — Therapy (Signed)
[facility] [address] [place], [place], [address] Phone: [phone]   Fax:  [phone]  Physical Therapy Treatment  Patient Details  Name: [name]. MRN: [id number] Date of Birth: [birth year] Referring Provider: Jannifer Franklin  Encounter Date: 08/29/2016      PT End of Session - 08/30/16 0740    Visit Number 2   Number of Visits 18   Date for PT Re-Evaluation 10/26/16   Authorization Type [employer] every 10th visit   PT Start Time 0936   PT Stop Time 1023   PT Time Calculation (min) 47 min      Past Medical History:  Diagnosis Date  . Abdominal pain, right lower quadrant   . Abnormality of gait 05/16/2016  . Acute prostatitis   . Anxiety   . Constipation   . Degenerative disc disease   . Depression   . Diverticulosis of colon (without mention of hemorrhage)   . Essential and other specified forms of tremor   . Family history of malignant neoplasm of gastrointestinal tract   . Fibromyalgia   . Ganglion and cyst of synovium, tendon, and bursa   . GERD (gastroesophageal reflux disease)   . Hemarthrosis, upper arm   . Hyperlipidemia   . Hypersomnia with sleep apnea, unspecified   . Hypertension   . Hypopotassemia   . Localized osteoarthrosis not specified whether primary or secondary, lower leg   . Lumbago   . Memory difficulties 04/22/2013  . Nocturia   . Osteoporosis, unspecified   . Other testicular hypofunction   . Parkinson disease ([place]) 05/16/2016  . Right inguinal hernia   . Sleep apnea    last sleep study 11/11 on chart- Bipap with settings of 4 per  patient  . Spinal stenosis, lumbar region, without neurogenic claudication   . Syncope and collapse   . Unspecified adverse effect of unspecified drug, medicinal and biological substance   . Unspecified gastritis and gastroduodenitis without mention of hemorrhage   . Urinary frequency     Past Surgical History:  Procedure Laterality Date   . BACK SURGERY     x 3  . [place]  . INGUINAL HERNIA REPAIR  07/19/2011   Procedure: LAPAROSCOPIC INGUINAL HERNIA;  Surgeon: Odis Hollingshead, MD;  Location: [facility];  Service: General;  Laterality: Right;  Laparoscopic Repair of Recurrent Right  Ingunial Hernia with Mesh  . microdisectomy  03/02/2003, 06/29/2003, 12/24/2005  . TONSILLECTOMY      There were no vitals filed for this visit.      Subjective Assessment - 08/29/16 1154    Subjective Pt reports he feels as if he has a hernia but states MD says there is not one; is scheduled to see his PCP tomorrow; reports Rt sided back pain   Pertinent History degenerative disc disease, scoliosis, Parkinson's disease recent diagnosis in past 6 months, neuropathy   Patient Stated Goals Pt's goal for therapy is to help with pain and improve better balance and getting up from chairs.   Currently in Pain? Yes   Pain Score 4    Pain Location Back   Pain Orientation Right;Left;Lower   Pain Descriptors / Indicators Aching   Pain Type Chronic pain   Pain Onset More than a month ago                         [facility] Adult PT Treatment/Exercise - 08/30/16 0001  Ambulation/Gait   Ambulation/Gait Yes   Ambulation/Gait Assistance 5: Supervision   Ambulation Distance (Feet) 360 Feet   Assistive device None   Gait Pattern Step-through pattern;Decreased arm swing - right;Decreased step length - right;Decreased dorsiflexion - right;Decreased trunk rotation;Trunk flexed;Poor foot clearance - right   Ambulation Surface Level;Indoor   Gait Comments cues to stand erect, increase arm swing, increase step length and initial heel contact      Posture/Postural Control   Postural Limitations Rounded Shoulders;Forward head;Increased lumbar lordosis;Posterior pelvic tilt     Lumbar Exercises: Stretches   Lower Trunk Rotation 1 rep;30 seconds   Pelvic Tilt Other (comment)  10 reps   Piriformis Stretch 2 reps;30 seconds   each leg - 2 reps     Lumbar Exercises: Aerobic   Stationary Bike SciFit level 3.0 x 5" with UE's and LE's           PWR ([facility]) - 08/30/16 [id number]    Comments Pt performed PWR! trunk rotation to assist with rolling to Rt and Lt sides - 10 times each direction   PWR! Up 10X to fascilitate upright posture - reaching down toward floor and then reaching arms up with cues for big movement with the reach             PT Education - 08/30/16 0738    Education provided Yes   Education Details Pt instructed in back exercises - from back packet - pelvic tilt, bridging, single and double knee to chest, trunk rotation; also given PWR! up in seated for posture and PWR! rotation in supine to assist with rolling   Person(s) Educated Patient   Methods Explanation;Demonstration;Handout   Comprehension Verbalized understanding;Returned demonstration;Verbal cues required          PT Short Term Goals - 08/27/16 2156      PT SHORT TERM GOAL #1   Title Pt will be independent with HEP for improved transfers, balance and mobility.  TARGET 09/28/15   Time 5   Period Weeks   Status New     PT SHORT TERM GOAL #2   Title Pt will improve 5x sit<>stand to less than or equal to 20 seconds for decreased fall risk.   Time 5   Period Weeks   Status New     PT SHORT TERM GOAL #3   Title Pt will improve TUG score to less than or equal to 13.5 seconds for decreased fall risk.   Time 5   Period Weeks   Status New     PT SHORT TERM GOAL #4   Title Pt will verbalize/demonstrate understanding of body mechanics and positioning, for decreased pain with functional mobility.   Time 5   Period Weeks   Status New     PT SHORT TERM GOAL #5   Title Pt will verbalize understanding of local Parkinson's resources.   Time 5   Period Weeks   Status New           PT Long Term Goals - 08/27/16 2204      PT LONG TERM GOAL #1   Title Pt will verbalize understanding of fall prevention/tips to reduce  freezing episodes of gait.  TARGET 10/26/16   Time 9   Period Weeks   Status New     PT LONG TERM GOAL #2   Title Pt will improve 5x sit<>stand to less than or equal to 15 seconds for decreased fall risk.    Time 9   Period Weeks  Status New     PT LONG TERM GOAL #3   Title Pt will improve TUG cognitive to less than or equal to 15 seconds for decreased fall risk.   Time 9   Period Weeks   Status New     PT LONG TERM GOAL #4   Title Pt will report at least 50% improvement in bed mobility.   Time 9   Period Weeks   Status New     PT LONG TERM GOAL #5   Title Pt will ambulate at least 1000 ft. indoor and outdoor surfaces, without loss of balance, for improved outdoor and community gait..   Time 9   Period Weeks   Status New     Additional Long Term Goals   Additional Long Term Goals Yes     PT LONG TERM GOAL #6   Title Pt will improve Functional Gait Assessment to at least 20/56 for decreased fall risk.   Time 9   Period Weeks   Status New     PT LONG TERM GOAL #7   Title Pt will verbalize plans for continued community fitness upon D/C from PT.   Time 9   Period Weeks   Status New               Plan - 08/30/16 0743    Clinical Impression Statement Pt's mobility and tolerance for exercises limited by c/o back pain; pt reported pain in R lateral trunk, in R PSIS region; pt's gait pattern improved with cues for increased step length and for increased arm swing   Rehab Potential Good   PT Frequency 2x / week   PT Duration 8 weeks   PT Treatment/Interventions ADLs/Self Care Home Management;Functional mobility training;Gait training;Therapeutic activities;Therapeutic exercise;Balance training;Neuromuscular re-education;Patient/family education;Manual techniques;Electrical Stimulation   PT Next Visit Plan check HEP given 08-29-16 (back stretches and 2 PWR! exercises - 1 in seated and 1 in supine):  cont with PWR! moves in standing to improve balance   PT Home Exercise  Plan back exercises from packet and 2 PWR! moves   Consulted and Agree with Plan of Care Patient      Patient will benefit from skilled therapeutic intervention in order to improve the following deficits and impairments:  Abnormal gait, Decreased balance, Decreased cognition, Decreased mobility, Decreased strength, Difficulty walking, Postural dysfunction, Pain  Visit Diagnosis: Other abnormalities of gait and mobility     Problem List Patient Active Problem List   Diagnosis Date Noted  . Parkinson disease ([place]) 05/16/2016  . Abnormality of gait 05/16/2016  . Knee pain 11/01/2013  . Traumatic bursitis 11/01/2013  . Hx of fall 11/01/2013  . Memory difficulties 04/22/2013  . Hemorrhoids 02/24/2013  . PRIMARY CENTRAL SLEEP APNEA 05/21/2010  . NOCTURIA 05/21/2010  . HYPERSOMNIA, ASSOCIATED WITH SLEEP APNEA 03/26/2010  . ABDOMINAL PAIN RIGHT LOWER QUADRANT 03/20/2010  . TREMOR, ESSENTIAL 11/22/2009  . SPINAL STENOSIS, LUMBAR 06/07/2009  . OSTEOARTHRITIS, LOWER LEG, LEFT 05/16/2009  . MYOFASCIAL PAIN SYNDROME 05/16/2009  . URINARY FREQUENCY, CHRONIC 02/09/2009  . DISUSE OSTEOPOROSIS 09/07/2008  . HYPOGONADISM, MALE 07/01/2008  . HYPOKALEMIA 04/19/2008  . SYNCOPE 04/19/2008  . UNS ADVRS EFF UNS RX MEDICINAL&BIOLOGICAL SBSTNC 02/26/2008  . CONSTIPATION, CHRONIC 11/23/2007  . HYPERLIPIDEMIA 05/15/2007  . Right-sided low back pain without sciatica 05/15/2007  . Osteoporosis 05/15/2007  . DIVERTICULOSIS, COLON 02/23/2007  . Essential hypertension 02/12/2007  . GERD 02/12/2007  . GASTRITIS 02/12/2007    Alda Lea, PT 08/30/2016, 7:54 AM  [facility] [address] [place], [place], [address] Phone: [phone]   Fax:  [phone]  Name: [name]. MRN: [id number] Date of Birth: Mar 28, 1946

## 2016-09-02 ENCOUNTER — Ambulatory Visit: Payer: PPO | Admitting: Physical Therapy

## 2016-09-02 DIAGNOSIS — R2689 Other abnormalities of gait and mobility: Secondary | ICD-10-CM | POA: Diagnosis not present

## 2016-09-02 DIAGNOSIS — R29818 Other symptoms and signs involving the nervous system: Secondary | ICD-10-CM

## 2016-09-02 DIAGNOSIS — R2681 Unsteadiness on feet: Secondary | ICD-10-CM

## 2016-09-02 NOTE — Therapy (Signed)
[facility] [address] [place], [place], [address] Phone: [phone]   Fax:  [phone]  Physical Therapy Treatment  Patient Details  Name: [name]. MRN: [id number] Date of Birth: 1946/01/07 Referring Provider: Jannifer Franklin  Encounter Date: 09/02/2016      PT End of Session - 09/02/16 1034    Visit Number 3   Number of Visits 18   Date for PT Re-Evaluation 10/26/16   Authorization Type [employer] every 10th visit   PT Start Time 0930   PT Stop Time 1010   PT Time Calculation (min) 40 min   Activity Tolerance Patient tolerated treatment well   Behavior During Therapy [facility] for tasks assessed/performed      Past Medical History:  Diagnosis Date  . Abdominal pain, right lower quadrant   . Abnormality of gait 05/16/2016  . Acute prostatitis   . Anxiety   . Constipation   . Degenerative disc disease   . Depression   . Diverticulosis of colon (without mention of hemorrhage)   . Essential and other specified forms of tremor   . Family history of malignant neoplasm of gastrointestinal tract   . Fibromyalgia   . Ganglion and cyst of synovium, tendon, and bursa   . GERD (gastroesophageal reflux disease)   . Hemarthrosis, upper arm   . Hyperlipidemia   . Hypersomnia with sleep apnea, unspecified   . Hypertension   . Hypopotassemia   . Localized osteoarthrosis not specified whether primary or secondary, lower leg   . Lumbago   . Memory difficulties 04/22/2013  . Nocturia   . Osteoporosis, unspecified   . Other testicular hypofunction   . Parkinson disease ([place]) 05/16/2016  . Right inguinal hernia   . Sleep apnea    last sleep study 11/11 on chart- Bipap with settings of 4 per  patient  . Spinal stenosis, lumbar region, without neurogenic claudication   . Syncope and collapse   . Unspecified adverse effect of unspecified drug, medicinal and biological substance   . Unspecified gastritis and  gastroduodenitis without mention of hemorrhage   . Urinary frequency     Past Surgical History:  Procedure Laterality Date  . BACK SURGERY     x 3  . [place]  . INGUINAL HERNIA REPAIR  07/19/2011   Procedure: LAPAROSCOPIC INGUINAL HERNIA;  Surgeon: Odis Hollingshead, MD;  Location: [facility];  Service: General;  Laterality: Right;  Laparoscopic Repair of Recurrent Right  Ingunial Hernia with Mesh  . microdisectomy  03/02/2003, 06/29/2003, 12/24/2005  . TONSILLECTOMY      There were no vitals filed for this visit.      Subjective Assessment - 09/02/16 0935    Subjective About the same. Reports he has done HEP all but one day. Back stretches seem to help, but often fatigued at end of day and doesn't feel up to doing them then. Discussed doing them earlier in the morning to help "limber up" his back for the day and he agreed he prefers to do them in the a.m.    Pertinent History degenerative disc disease, scoliosis, Parkinson's disease recent diagnosis in past 6 months, neuropathy   Patient Stated Goals Pt's goal for therapy is to help with pain and improve better balance and getting up from chairs.   Currently in Pain? Yes   Pain Score 5    Pain Location Back   Pain Orientation Lower   Pain Descriptors / Indicators Aching  Pain Radiating Towards Rt hip   Pain Onset More than a month ago   Pain Frequency Constant   Aggravating Factors  getting out of bed; sudden movements   Pain Relieving Factors rest and stretching alleviate; he does not use pain medicines as "they don't help"   Multiple Pain Sites No                         [facility] Adult PT Treatment/Exercise - 09/02/16 1019      Bed Mobility   Bed Mobility Rolling Right;Rolling Left;Left Sidelying to Sit   Rolling Right 5: Supervision   Rolling Right Details (indicate cue type and reason) vc to bring UE across and not push into extension leaving arm behind   Rolling Left 5: Supervision   Rolling  Left Details (indicate cue type and reason) same as right   Left Sidelying to Sit 5: Supervision   Left Sidelying to Sit Details (indicate cue type and reason) pt self selected supine to sit and reported "it always pulls on my back" Educated on sidelying to sit and pt reported decr lumbar strain     Transfers   Transfers Sit to Stand;Stand to Sit   Sit to Stand 6: Modified independent (Device/Increase time);Without upper extremity assist;From chair/3-in-1   Stand to Sit 6: Modified independent (Device/Increase time);Without upper extremity assist;To chair/3-in-1     Posture/Postural Control   Posture/Postural Control Postural limitations   Postural Limitations Rounded Shoulders;Forward head;Decreased lumbar lordosis;Posterior pelvic tilt     Exercises   Exercises Other Exercises   Other Exercises  Patient performed PWR twist in supine x 20 reps with moderate cues for sequencing and technique; modified seated PWR up (pt reaches to floor and reaches overhead as shown 1/11), educated on alternative to put hands on knees as stretching forward--pt prefers reaching down to ground to stretch low back; Educated in standing PWR rock and PWR twist x 20 reps each. Pt required max cues (visual and verbal) for twist and did not add this to HEP. Instructed to continue in supine.     Lumbar Exercises: Stretches   Single Knee to Chest Stretch 2 reps;20 seconds  bil   Single Knee to Chest Stretch Limitations excellent technique   Double Knee to Chest Stretch 20 seconds;1 rep   Double Knee to Chest Stretch Limitations excellent technique   Lower Trunk Rotation 2 reps;20 seconds  bil    Lower Trunk Rotation Limitations excellent technique   Pelvic Tilt --  10 reps, 1 sec hold                PT Education - 09/02/16 1033    Education provided Yes   Education Details Reviewed back exercises with excellent technique. Added Standing PWR rock to [employer]) Educated Patient   Methods  Explanation;Demonstration;Verbal cues;Handout   Comprehension Verbalized understanding;Returned demonstration;Need further instruction          PT Short Term Goals - 09/02/16 1044      PT SHORT TERM GOAL #1   Title Pt will be independent with HEP for improved transfers, balance and mobility.  TARGET 09/28/15   Time 5   Period Weeks   Status On-going     PT SHORT TERM GOAL #2   Title Pt will improve 5x sit<>stand to less than or equal to 20 seconds for decreased fall risk.   Time 5   Period Weeks   Status On-going  PT SHORT TERM GOAL #3   Title Pt will improve TUG score to less than or equal to 13.5 seconds for decreased fall risk.   Period Weeks   Status On-going     PT SHORT TERM GOAL #4   Title Pt will verbalize/demonstrate understanding of body mechanics and positioning, for decreased pain with functional mobility.   Time 5   Period Weeks   Status On-going     PT SHORT TERM GOAL #5   Title Pt will verbalize understanding of local Parkinson's resources.   Time 5   Period Weeks   Status On-going           PT Long Term Goals - 09/02/16 1046      PT LONG TERM GOAL #1   Title Pt will verbalize understanding of fall prevention/tips to reduce freezing episodes of gait.  TARGET 10/26/16   Time 9   Period Weeks   Status On-going     PT LONG TERM GOAL #2   Title Pt will improve 5x sit<>stand to less than or equal to 15 seconds for decreased fall risk.    Time 9   Period Weeks   Status On-going     PT LONG TERM GOAL #3   Title Pt will improve TUG cognitive to less than or equal to 15 seconds for decreased fall risk.   Time 9   Period Weeks   Status On-going     PT LONG TERM GOAL #4   Title Pt will report at least 50% improvement in bed mobility.   Time 9   Period Weeks   Status On-going     PT LONG TERM GOAL #5   Title Pt will ambulate at least 1000 ft. indoor and outdoor surfaces, without loss of balance, for improved outdoor and community gait..   Time  9   Period Weeks   Status On-going     PT LONG TERM GOAL #6   Title Pt will improve Functional Gait Assessment to at least 20/56 for decreased fall risk.   Time 9   Period Weeks   Status On-going     PT LONG TERM GOAL #7   Title Pt will verbalize plans for continued community fitness upon D/C from PT.   Time 9   Period Weeks   Status On-going               Plan - 09/02/16 1035    Clinical Impression Statement Patient tolerated all exercises with no incr back pain. He has a good understanding of doing exercises slowly and controlled so he does not aggravate his back (chronic pain; s/p 3 surgeries). Patient with overall bradykinesia due to Parkinson's and requires incr time for all activities.    Rehab Potential Good   PT Frequency 2x / week   PT Duration 8 weeks   PT Treatment/Interventions ADLs/Self Care Home Management;Functional mobility training;Gait training;Therapeutic activities;Therapeutic exercise;Balance training;Neuromuscular re-education;Patient/family education;Manual techniques;Electrical Stimulation   PT Next Visit Plan check HEP PWR supine twist; cont with PWR! moves in standing to improve balance; walking for balance and velocity   PT Home Exercise Plan back exercises from packet and 3 PWR! moves   Consulted and Agree with Plan of Care Patient      Patient will benefit from skilled therapeutic intervention in order to improve the following deficits and impairments:  Abnormal gait, Decreased balance, Decreased cognition, Decreased mobility, Decreased strength, Difficulty walking, Postural dysfunction, Pain  Visit Diagnosis: Other symptoms and signs involving  the nervous system  Unsteadiness on feet  Other abnormalities of gait and mobility     Problem List Patient Active Problem List   Diagnosis Date Noted  . Parkinson disease ([place]) 05/16/2016  . Abnormality of gait 05/16/2016  . Knee pain 11/01/2013  . Traumatic bursitis 11/01/2013  . Hx of fall  11/01/2013  . Memory difficulties 04/22/2013  . Hemorrhoids 02/24/2013  . PRIMARY CENTRAL SLEEP APNEA 05/21/2010  . NOCTURIA 05/21/2010  . HYPERSOMNIA, ASSOCIATED WITH SLEEP APNEA 03/26/2010  . ABDOMINAL PAIN RIGHT LOWER QUADRANT 03/20/2010  . TREMOR, ESSENTIAL 11/22/2009  . SPINAL STENOSIS, LUMBAR 06/07/2009  . OSTEOARTHRITIS, LOWER LEG, LEFT 05/16/2009  . MYOFASCIAL PAIN SYNDROME 05/16/2009  . URINARY FREQUENCY, CHRONIC 02/09/2009  . DISUSE OSTEOPOROSIS 09/07/2008  . HYPOGONADISM, MALE 07/01/2008  . HYPOKALEMIA 04/19/2008  . SYNCOPE 04/19/2008  . UNS ADVRS EFF UNS RX MEDICINAL&BIOLOGICAL SBSTNC 02/26/2008  . CONSTIPATION, CHRONIC 11/23/2007  . HYPERLIPIDEMIA 05/15/2007  . Right-sided low back pain without sciatica 05/15/2007  . Osteoporosis 05/15/2007  . DIVERTICULOSIS, COLON 02/23/2007  . Essential hypertension 02/12/2007  . GERD 02/12/2007  . GASTRITIS 02/12/2007    Rexanne Mano, PT 09/02/2016, 10:49 AM  [facility] [address] [place], [place], [address] Phone: [phone]   Fax:  [phone]  Name: [name]. MRN: [id number] Date of Birth: [birth year]

## 2016-09-03 DIAGNOSIS — R3912 Poor urinary stream: Secondary | ICD-10-CM | POA: Diagnosis not present

## 2016-09-03 DIAGNOSIS — R350 Frequency of micturition: Secondary | ICD-10-CM | POA: Diagnosis not present

## 2016-09-05 ENCOUNTER — Ambulatory Visit: Payer: PPO | Admitting: Physical Therapy

## 2016-09-08 ENCOUNTER — Other Ambulatory Visit: Payer: Self-pay | Admitting: Neurology

## 2016-09-09 ENCOUNTER — Encounter: Payer: Self-pay | Admitting: Rehabilitation

## 2016-09-09 ENCOUNTER — Ambulatory Visit: Payer: PPO | Admitting: Rehabilitation

## 2016-09-09 DIAGNOSIS — R2689 Other abnormalities of gait and mobility: Secondary | ICD-10-CM

## 2016-09-09 DIAGNOSIS — R293 Abnormal posture: Secondary | ICD-10-CM

## 2016-09-09 DIAGNOSIS — R2681 Unsteadiness on feet: Secondary | ICD-10-CM

## 2016-09-09 DIAGNOSIS — R29818 Other symptoms and signs involving the nervous system: Secondary | ICD-10-CM

## 2016-09-09 NOTE — Therapy (Signed)
[facility] [address] [place], [place], [address] Phone: [phone]   Fax:  [phone]  Physical Therapy Treatment  Patient Details  Name: [name]. MRN: [id number] Date of Birth: 1946-06-24 Referring Provider: Jannifer Franklin  Encounter Date: 09/09/2016      PT End of Session - 09/09/16 1259    Visit Number 4   Number of Visits 18   Date for PT Re-Evaluation 10/26/16   Authorization Type [employer] every 10th visit   PT Start Time 0932   PT Stop Time 1016   PT Time Calculation (min) 44 min   Activity Tolerance Patient tolerated treatment well   Behavior During Therapy [facility] for tasks assessed/performed      Past Medical History:  Diagnosis Date  . Abdominal pain, right lower quadrant   . Abnormality of gait 05/16/2016  . Acute prostatitis   . Anxiety   . Constipation   . Degenerative disc disease   . Depression   . Diverticulosis of colon (without mention of hemorrhage)   . Essential and other specified forms of tremor   . Family history of malignant neoplasm of gastrointestinal tract   . Fibromyalgia   . Ganglion and cyst of synovium, tendon, and bursa   . GERD (gastroesophageal reflux disease)   . Hemarthrosis, upper arm   . Hyperlipidemia   . Hypersomnia with sleep apnea, unspecified   . Hypertension   . Hypopotassemia   . Localized osteoarthrosis not specified whether primary or secondary, lower leg   . Lumbago   . Memory difficulties 04/22/2013  . Nocturia   . Osteoporosis, unspecified   . Other testicular hypofunction   . Parkinson disease ([place]) 05/16/2016  . Right inguinal hernia   . Sleep apnea    last sleep study 11/11 on chart- Bipap with settings of 4 per  patient  . Spinal stenosis, lumbar region, without neurogenic claudication   . Syncope and collapse   . Unspecified adverse effect of unspecified drug, medicinal and biological substance   . Unspecified gastritis and  gastroduodenitis without mention of hemorrhage   . Urinary frequency     Past Surgical History:  Procedure Laterality Date  . BACK SURGERY     x 3  . [place]  . INGUINAL HERNIA REPAIR  07/19/2011   Procedure: LAPAROSCOPIC INGUINAL HERNIA;  Surgeon: Odis Hollingshead, MD;  Location: [facility];  Service: General;  Laterality: Right;  Laparoscopic Repair of Recurrent Right  Ingunial Hernia with Mesh  . microdisectomy  03/02/2003, 06/29/2003, 12/24/2005  . TONSILLECTOMY      There were no vitals filed for this visit.      Subjective Assessment - 09/09/16 0932    Subjective Continues to report increased back pain today, had a rough morning.     Pertinent History degenerative disc disease, scoliosis, Parkinson's disease recent diagnosis in past 6 months, neuropathy   Patient Stated Goals Pt's goal for therapy is to help with pain and improve better balance and getting up from chairs.   Currently in Pain? Yes   Pain Score 6    Pain Location Back   Pain Orientation Lower   Pain Descriptors / Indicators Aching   Pain Type Chronic pain   Pain Onset More than a month ago   Pain Frequency Constant   Aggravating Factors  getting out of bed, sudden movements    Pain Relieving Factors rest and stretching alleviate.  [facility] Adult PT Treatment/Exercise - 09/09/16 0001      Posture/Postural Control   Posture Comments Had pt perform lateral leaning to the R over wedge with overhead reach with L UE to open up L side due to scoliosis.   2 reps of 1 min holds.           PWR [facility]) - 09/09/16 1004    PWR! Twist x 10 reps on each side   PWR! Twist x 10 reps on each side   PWR! Up x 10 reps   PWR! Twist x 10 reps each side       TE:  Reviewed current HEP, see pt instruction for details.          PT Education - 09/09/16 0933    Education provided Yes   Education Details Continue to educate on impotance of stretches to reduce back  pain.  Provided pt with name/number for PT that has pilates clinic per previous PT.    Person(s) Educated Patient   Methods Explanation   Comprehension Verbalized understanding          PT Short Term Goals - 09/02/16 1044      PT SHORT TERM GOAL #1   Title Pt will be independent with HEP for improved transfers, balance and mobility.  TARGET 09/28/15   Time 5   Period Weeks   Status On-going     PT SHORT TERM GOAL #2   Title Pt will improve 5x sit<>stand to less than or equal to 20 seconds for decreased fall risk.   Time 5   Period Weeks   Status On-going     PT SHORT TERM GOAL #3   Title Pt will improve TUG score to less than or equal to 13.5 seconds for decreased fall risk.   Period Weeks   Status On-going     PT SHORT TERM GOAL #4   Title Pt will verbalize/demonstrate understanding of body mechanics and positioning, for decreased pain with functional mobility.   Time 5   Period Weeks   Status On-going     PT SHORT TERM GOAL #5   Title Pt will verbalize understanding of local Parkinson's resources.   Time 5   Period Weeks   Status On-going           PT Long Term Goals - 09/02/16 1046      PT LONG TERM GOAL #1   Title Pt will verbalize understanding of fall prevention/tips to reduce freezing episodes of gait.  TARGET 10/26/16   Time 9   Period Weeks   Status On-going     PT LONG TERM GOAL #2   Title Pt will improve 5x sit<>stand to less than or equal to 15 seconds for decreased fall risk.    Time 9   Period Weeks   Status On-going     PT LONG TERM GOAL #3   Title Pt will improve TUG cognitive to less than or equal to 15 seconds for decreased fall risk.   Time 9   Period Weeks   Status On-going     PT LONG TERM GOAL #4   Title Pt will report at least 50% improvement in bed mobility.   Time 9   Period Weeks   Status On-going     PT LONG TERM GOAL #5   Title Pt will ambulate at least 1000 ft. indoor and outdoor surfaces, without loss of balance, for  improved outdoor and community gait..   Time  9   Period Weeks   Status On-going     PT LONG TERM GOAL #6   Title Pt will improve Functional Gait Assessment to at least 20/56 for decreased fall risk.   Time 9   Period Weeks   Status On-going     PT LONG TERM GOAL #7   Title Pt will verbalize plans for continued community fitness upon D/C from PT.   Time 9   Period Weeks   Status On-going               Plan - 09/09/16 1259    Clinical Impression Statement Skilled session focused on review of supine and standing PWR! moves as well as stretches to reduce low back pain and core strength to reduce back pain.  Reviewed current HEP, see pt instruction.     Rehab Potential Good   PT Frequency 2x / week   PT Duration 8 weeks   PT Treatment/Interventions ADLs/Self Care Home Management;Functional mobility training;Gait training;Therapeutic activities;Therapeutic exercise;Balance training;Neuromuscular re-education;Patient/family education;Manual techniques;Electrical Stimulation   PT Next Visit Plan compliance with stretching program, cont with PWR! moves in standing to improve balance; walking for balance and velocity   PT Home Exercise Plan back exercises from packet and 3 PWR! moves   Consulted and Agree with Plan of Care Patient      Patient will benefit from skilled therapeutic intervention in order to improve the following deficits and impairments:  Abnormal gait, Decreased balance, Decreased cognition, Decreased mobility, Decreased strength, Difficulty walking, Postural dysfunction, Pain  Visit Diagnosis: Other symptoms and signs involving the nervous system  Unsteadiness on feet  Other abnormalities of gait and mobility  Abnormal posture     Problem List Patient Active Problem List   Diagnosis Date Noted  . Parkinson disease ([place]) 05/16/2016  . Abnormality of gait 05/16/2016  . Knee pain 11/01/2013  . Traumatic bursitis 11/01/2013  . Hx of fall 11/01/2013  .  Memory difficulties 04/22/2013  . Hemorrhoids 02/24/2013  . PRIMARY CENTRAL SLEEP APNEA 05/21/2010  . NOCTURIA 05/21/2010  . HYPERSOMNIA, ASSOCIATED WITH SLEEP APNEA 03/26/2010  . ABDOMINAL PAIN RIGHT LOWER QUADRANT 03/20/2010  . TREMOR, ESSENTIAL 11/22/2009  . SPINAL STENOSIS, LUMBAR 06/07/2009  . OSTEOARTHRITIS, LOWER LEG, LEFT 05/16/2009  . MYOFASCIAL PAIN SYNDROME 05/16/2009  . URINARY FREQUENCY, CHRONIC 02/09/2009  . DISUSE OSTEOPOROSIS 09/07/2008  . HYPOGONADISM, MALE 07/01/2008  . HYPOKALEMIA 04/19/2008  . SYNCOPE 04/19/2008  . UNS ADVRS EFF UNS RX MEDICINAL&BIOLOGICAL SBSTNC 02/26/2008  . CONSTIPATION, CHRONIC 11/23/2007  . HYPERLIPIDEMIA 05/15/2007  . Right-sided low back pain without sciatica 05/15/2007  . Osteoporosis 05/15/2007  . DIVERTICULOSIS, COLON 02/23/2007  . Essential hypertension 02/12/2007  . GERD 02/12/2007  . GASTRITIS 02/12/2007    [name] 09/09/2016, 1:02 PM  [facility] [address] [place], [place], [address] Phone: [phone]   Fax:  [phone]  Name: [name]. MRN: [id number] Date of Birth: 10-18-1945

## 2016-09-09 NOTE — Patient Instructions (Signed)
"  Let's Twist" Stretch    With both legs bent and feet flat on floor, cross one leg over. Keeping both shoulders on floor, roll crossed legs as far as possible without pain. Repeat with other leg. Repeat ___2_ times. Do _1-2___ sessions per day.  (LEGS NOT CROSSED - gave trunk rotation picture from back packet) -- HOLD 30 secs  http://gt2.exer.us/830   Copyright  VHI. All rights reserved.  Knee to Chest    Lying supine, bend involved knee to chest _2__ times. Repeat with other leg. Do _2__ times per day.  HOLD 20-30 secs  Copyright  VHI. All rights reserved.  Double Knee to Chest (Flexion)    Gently pull both knees toward chest. Feel stretch in lower back or buttock area. Breathing deeply, Hold __20-30__ seconds. Repeat __2_ times. Do ___1-2_ sessions per day.  http://gt2.exer.us/227   Copyright  VHI. All rights reserved.  Bracing With Bridging (Hook-Lying)    With neutral spine, tighten pelvic floor and abdominals and hold. Lift bottom. Repeat _10__ times. Do _1__ times a day.   Pelvic Tilt (Flexion)    With feet flat and knees bent, flatten lower back into bed. Tighten stomach muscles. Hold _3-5___ seconds. Repeat _10___ times. Do _1_ sessions per day.  http://gt2.exer.us/229   Copyright  VHI. All rights reserved.

## 2016-09-12 ENCOUNTER — Ambulatory Visit: Payer: PPO | Admitting: Physical Therapy

## 2016-09-12 DIAGNOSIS — R2689 Other abnormalities of gait and mobility: Secondary | ICD-10-CM | POA: Diagnosis not present

## 2016-09-12 DIAGNOSIS — R2681 Unsteadiness on feet: Secondary | ICD-10-CM

## 2016-09-12 DIAGNOSIS — R293 Abnormal posture: Secondary | ICD-10-CM

## 2016-09-12 NOTE — Therapy (Signed)
[facility] [address] [place], [place], [address] Phone: [phone]   Fax:  [phone]  Physical Therapy Treatment  Patient Details  Name: [name]. MRN: [id number] Date of Birth: 1946/03/22 Referring Provider: Jannifer Franklin  Encounter Date: 09/12/2016      PT End of Session - 09/12/16 2218    Visit Number 5   Number of Visits 18   Date for PT Re-Evaluation 10/26/16   Authorization Type [employer] every 10th visit   PT Start Time 0933   PT Stop Time 1018   PT Time Calculation (min) 45 min   Activity Tolerance Patient tolerated treatment well   Behavior During Therapy [facility] for tasks assessed/performed      Past Medical History:  Diagnosis Date  . Abdominal pain, right lower quadrant   . Abnormality of gait 05/16/2016  . Acute prostatitis   . Anxiety   . Constipation   . Degenerative disc disease   . Depression   . Diverticulosis of colon (without mention of hemorrhage)   . Essential and other specified forms of tremor   . Family history of malignant neoplasm of gastrointestinal tract   . Fibromyalgia   . Ganglion and cyst of synovium, tendon, and bursa   . GERD (gastroesophageal reflux disease)   . Hemarthrosis, upper arm   . Hyperlipidemia   . Hypersomnia with sleep apnea, unspecified   . Hypertension   . Hypopotassemia   . Localized osteoarthrosis not specified whether primary or secondary, lower leg   . Lumbago   . Memory difficulties 04/22/2013  . Nocturia   . Osteoporosis, unspecified   . Other testicular hypofunction   . Parkinson disease ([place]) 05/16/2016  . Right inguinal hernia   . Sleep apnea    last sleep study 11/11 on chart- Bipap with settings of 4 per  patient  . Spinal stenosis, lumbar region, without neurogenic claudication   . Syncope and collapse   . Unspecified adverse effect of unspecified drug, medicinal and biological substance   . Unspecified gastritis and  gastroduodenitis without mention of hemorrhage   . Urinary frequency     Past Surgical History:  Procedure Laterality Date  . BACK SURGERY     x 3  . [place]  . INGUINAL HERNIA REPAIR  07/19/2011   Procedure: LAPAROSCOPIC INGUINAL HERNIA;  Surgeon: Odis Hollingshead, MD;  Location: [facility];  Service: General;  Laterality: Right;  Laparoscopic Repair of Recurrent Right  Ingunial Hernia with Mesh  . microdisectomy  03/02/2003, 06/29/2003, 12/24/2005  . TONSILLECTOMY      There were no vitals filed for this visit.      Subjective Assessment - 09/12/16 0937    Subjective Back pain is horrible today.  Hurts all the time, but the pain is more sharp in R low back with activity.   Pertinent History degenerative disc disease, scoliosis, Parkinson's disease recent diagnosis in past 6 months, neuropathy   Patient Stated Goals Pt's goal for therapy is to help with pain and improve better balance and getting up from chairs.   Currently in Pain? Yes   Pain Score 6    Pain Location Back   Pain Orientation Right;Lower   Pain Descriptors / Indicators Aching;Dull  more sharp pain with activity and movement   Pain Onset More than a month ago   Pain Frequency Constant   Aggravating Factors  certain movements   Pain Relieving Factors sitting and resting  [facility] Adult PT Treatment/Exercise - 09/12/16 0001      Transfers   Transfers Sit to Stand;Stand to Sit   Sit to Stand 6: Modified independent (Device/Increase time);With upper extremity assist;From chair/3-in-1;From elevated surface   Stand to Sit 6: Modified independent (Device/Increase time);With upper extremity assist;To elevated surface;To chair/3-in-1   Number of Reps Other sets (comment)  5 reps from elevated mat, 8 reps from 18" chair   Transfer Cueing Cues for transfer technique, use of momentum for improved ease of transfer.     Ambulation/Gait   Ambulation/Gait Yes    Ambulation/Gait Assistance 5: Supervision;4: Min guard   Ambulation/Gait Assistance Details Cues for cane technique, cues for increased step length, deliberate cane placement    Ambulation Distance (Feet) 120 Feet  then 200   Assistive device Straight cane   Gait Pattern Step-through pattern;Decreased arm swing - right;Decreased step length - right;Decreased dorsiflexion - right;Decreased trunk rotation;Trunk flexed;Poor foot clearance - right   Ambulation Surface Level;Indoor   Gait Comments Discussed benefits of proper cane use to help with back pain.  Discussed walking program for home.     High Level Balance   High Level Balance Comments Stagger stance weigthshifting forward and back x 10-15 reps, with tactile and verbal cues for weightshifting and arm swing.  Forward step and weightshift x 10 reps with UE support at counter.           PWR [facility]) - 09/12/16 2212    PWR! Up x 10 reps at counter   PWR! Rock x 10 reps, each side, cues for widened BOS and weightshifting   PWR Step x 10 reps lateral step and weightshift with intermittent UE support.   Comments In standing at counter, PWR! Moves as performed as above for improved posture, trunk flexibility, and step/weightshifting             PT Education - 09/12/16 2218    Education provided Yes   Education Details HEP   Person(s) Educated Patient   Methods Explanation;Demonstration;Handout   Comprehension Verbalized understanding;Returned demonstration;Need further instruction          PT Short Term Goals - 09/02/16 1044      PT SHORT TERM GOAL #1   Title Pt will be independent with HEP for improved transfers, balance and mobility.  TARGET 09/28/15   Time 5   Period Weeks   Status On-going     PT SHORT TERM GOAL #2   Title Pt will improve 5x sit<>stand to less than or equal to 20 seconds for decreased fall risk.   Time 5   Period Weeks   Status On-going     PT SHORT TERM GOAL #3   Title Pt will improve TUG score  to less than or equal to 13.5 seconds for decreased fall risk.   Period Weeks   Status On-going     PT SHORT TERM GOAL #4   Title Pt will verbalize/demonstrate understanding of body mechanics and positioning, for decreased pain with functional mobility.   Time 5   Period Weeks   Status On-going     PT SHORT TERM GOAL #5   Title Pt will verbalize understanding of local Parkinson's resources.   Time 5   Period Weeks   Status On-going           PT Long Term Goals - 09/02/16 1046      PT LONG TERM GOAL #1   Title Pt will verbalize understanding of fall prevention/tips to reduce  freezing episodes of gait.  TARGET 10/26/16   Time 9   Period Weeks   Status On-going     PT LONG TERM GOAL #2   Title Pt will improve 5x sit<>stand to less than or equal to 15 seconds for decreased fall risk.    Time 9   Period Weeks   Status On-going     PT LONG TERM GOAL #3   Title Pt will improve TUG cognitive to less than or equal to 15 seconds for decreased fall risk.   Time 9   Period Weeks   Status On-going     PT LONG TERM GOAL #4   Title Pt will report at least 50% improvement in bed mobility.   Time 9   Period Weeks   Status On-going     PT LONG TERM GOAL #5   Title Pt will ambulate at least 1000 ft. indoor and outdoor surfaces, without loss of balance, for improved outdoor and community gait..   Time 9   Period Weeks   Status On-going     PT LONG TERM GOAL #6   Title Pt will improve Functional Gait Assessment to at least 20/56 for decreased fall risk.   Time 9   Period Weeks   Status On-going     PT LONG TERM GOAL #7   Title Pt will verbalize plans for continued community fitness upon D/C from PT.   Time 9   Period Weeks   Status On-going               Plan - 09/12/16 2219    Clinical Impression Statement Skilled PT session focused today on gait training using cane to initiate walking program for home, as well as standing weigthshifting, posture, and trunk  flexibility activities.  Pt has no reported increase in pain at end of PT session.  Will continue to benefit from additional skilled therapy to address balance, posture and gait activities.   Rehab Potential Good   PT Frequency 2x / week   PT Duration 8 weeks   PT Treatment/Interventions ADLs/Self Care Home Management;Functional mobility training;Gait training;Therapeutic activities;Therapeutic exercise;Balance training;Neuromuscular re-education;Patient/family education;Manual techniques;Electrical Stimulation   PT Next Visit Plan Review HEP given this visit; continue to work on standing weightshifting, step strategies   PT Home Exercise Plan back exercises from packet and 3 PWR! moves; 09/12/16-added walking program, sit<>stand from elevated surfaces, standing stagger stance forward/back weightshifting   Consulted and Agree with Plan of Care Patient      Patient will benefit from skilled therapeutic intervention in order to improve the following deficits and impairments:  Abnormal gait, Decreased balance, Decreased cognition, Decreased mobility, Decreased strength, Difficulty walking, Postural dysfunction, Pain  Visit Diagnosis: Abnormal posture  Unsteadiness on feet  Other abnormalities of gait and mobility     Problem List Patient Active Problem List   Diagnosis Date Noted  . Parkinson disease ([place]) 05/16/2016  . Abnormality of gait 05/16/2016  . Knee pain 11/01/2013  . Traumatic bursitis 11/01/2013  . Hx of fall 11/01/2013  . Memory difficulties 04/22/2013  . Hemorrhoids 02/24/2013  . PRIMARY CENTRAL SLEEP APNEA 05/21/2010  . NOCTURIA 05/21/2010  . HYPERSOMNIA, ASSOCIATED WITH SLEEP APNEA 03/26/2010  . ABDOMINAL PAIN RIGHT LOWER QUADRANT 03/20/2010  . TREMOR, ESSENTIAL 11/22/2009  . SPINAL STENOSIS, LUMBAR 06/07/2009  . OSTEOARTHRITIS, LOWER LEG, LEFT 05/16/2009  . MYOFASCIAL PAIN SYNDROME 05/16/2009  . URINARY FREQUENCY, CHRONIC 02/09/2009  . DISUSE OSTEOPOROSIS  09/07/2008  . HYPOGONADISM, MALE 07/01/2008  .  HYPOKALEMIA 04/19/2008  . SYNCOPE 04/19/2008  . UNS ADVRS EFF UNS RX MEDICINAL&BIOLOGICAL SBSTNC 02/26/2008  . CONSTIPATION, CHRONIC 11/23/2007  . HYPERLIPIDEMIA 05/15/2007  . Right-sided low back pain without sciatica 05/15/2007  . Osteoporosis 05/15/2007  . DIVERTICULOSIS, COLON 02/23/2007  . Essential hypertension 02/12/2007  . GERD 02/12/2007  . GASTRITIS 02/12/2007    Amabel Stmarie W. 09/12/2016, 10:29 PM  Frazier Butt., PT  [facility] [address] [place], [place], [address] Phone: [phone]   Fax:  [phone]  Name: [name]. MRN: [id number] Date of Birth: 05-01-1946

## 2016-09-12 NOTE — Patient Instructions (Addendum)
WALKING  Walking is a great form of exercise to increase your strength, endurance and overall fitness.  A walking program can help you start slowly and gradually build endurance as you go.  Everyone's ability is different, so each person's starting point will be different.  You do not have to follow them exactly.  The are just samples. You should simply find out what's right for you and stick to that program.   In the beginning, you'll start off walking 2-3 times a day for short distances.  As you get stronger, you'll be walking further at just 1-2 times per day.  A. You Can Walk For A Certain Length Of Time Each Day    Walk 3-4 minutes 3 times per day.  Try this in your home, using your cane.  Increase 1-2 minutes every several days (3 times per day).  Work up to 8-10 minutes (1-2 times per day).  Make sure to think of taking long strides and relaxed arm swing with walking.     Sit to Stand Transfers:  (TRY TO DO THIS FROM A BED HEIGHT, REPEATED 5-10 TIMES, 1-2 TIMES PER DAY FOR LEG STRENGTHENING)  1. Scoot out to the edge of the chair 2. Place your feet flat on the floor, shoulder width apart.  Make sure your feet are tucked just under your knees. 3. Lean forward (nose over toes) with momentum, and stand up tall with your best posture.  If you need to use your arms, use them as a quick boost up to stand. 4. If you are in a low or soft chair, you can lean back and then forward up to stand, in order to get more momentum. 5. Once you are standing, make sure you are looking ahead and standing tall.  To sit down:  1. Back up until you feel the chair behind your legs. 2. Bend at you hips, reaching  Back for you chair, if needed, then slowly squat to sit down on your chair.    Provided handout for stagger stance forward/back weightshift x 10 reps each foot position. (standing beside counter with support as needed)

## 2016-09-16 ENCOUNTER — Ambulatory Visit: Payer: PPO | Admitting: Physical Therapy

## 2016-09-16 ENCOUNTER — Encounter: Payer: Self-pay | Admitting: Physical Therapy

## 2016-09-16 DIAGNOSIS — R2689 Other abnormalities of gait and mobility: Secondary | ICD-10-CM | POA: Diagnosis not present

## 2016-09-16 DIAGNOSIS — R293 Abnormal posture: Secondary | ICD-10-CM

## 2016-09-16 DIAGNOSIS — R2681 Unsteadiness on feet: Secondary | ICD-10-CM

## 2016-09-16 NOTE — Therapy (Signed)
[facility] [address] [place], [place], [address] Phone: [phone]   Fax:  [phone]  Physical Therapy Treatment  Patient Details  Name: [name]. MRN: [id number] Date of Birth: 08-27-1945 Referring Provider: Jannifer Franklin  Encounter Date: 09/16/2016      PT End of Session - 09/16/16 [id number]    Visit Number 6   Number of Visits 18   Date for PT Re-Evaluation 10/26/16   Authorization Type [employer] every 10th visit   PT Start Time 0935   PT Stop Time 1014   PT Time Calculation (min) 39 min   Activity Tolerance Patient tolerated treatment well   Behavior During Therapy [facility] for tasks assessed/performed      Past Medical History:  Diagnosis Date  . Abdominal pain, right lower quadrant   . Abnormality of gait 05/16/2016  . Acute prostatitis   . Anxiety   . Constipation   . Degenerative disc disease   . Depression   . Diverticulosis of colon (without mention of hemorrhage)   . Essential and other specified forms of tremor   . Family history of malignant neoplasm of gastrointestinal tract   . Fibromyalgia   . Ganglion and cyst of synovium, tendon, and bursa   . GERD (gastroesophageal reflux disease)   . Hemarthrosis, upper arm   . Hyperlipidemia   . Hypersomnia with sleep apnea, unspecified   . Hypertension   . Hypopotassemia   . Localized osteoarthrosis not specified whether primary or secondary, lower leg   . Lumbago   . Memory difficulties 04/22/2013  . Nocturia   . Osteoporosis, unspecified   . Other testicular hypofunction   . Parkinson disease ([place]) 05/16/2016  . Right inguinal hernia   . Sleep apnea    last sleep study 11/11 on chart- Bipap with settings of 4 per  patient  . Spinal stenosis, lumbar region, without neurogenic claudication   . Syncope and collapse   . Unspecified adverse effect of unspecified drug, medicinal and biological substance   . Unspecified gastritis and  gastroduodenitis without mention of hemorrhage   . Urinary frequency     Past Surgical History:  Procedure Laterality Date  . BACK SURGERY     x 3  . [place]  . INGUINAL HERNIA REPAIR  07/19/2011   Procedure: LAPAROSCOPIC INGUINAL HERNIA;  Surgeon: Odis Hollingshead, MD;  Location: [facility];  Service: General;  Laterality: Right;  Laparoscopic Repair of Recurrent Right  Ingunial Hernia with Mesh  . microdisectomy  03/02/2003, 06/29/2003, 12/24/2005  . TONSILLECTOMY      There were no vitals filed for this visit.      Subjective Assessment - 09/16/16 0937    Subjective Pt was able to "get some walks in" over the weekend (outside) 15-43min; back was tired but not painful.   Pertinent History degenerative disc disease, scoliosis, Parkinson's disease recent diagnosis in past 6 months, neuropathy   Patient Stated Goals Pt's goal for therapy is to help with pain and improve better balance and getting up from chairs.   Currently in Pain? Yes   Pain Score 6    Pain Location Back   Pain Orientation Right;Left   Pain Descriptors / Indicators Aching;Dull   Pain Type Chronic pain   Pain Onset More than a month ago                         [facility] Adult PT  Treatment/Exercise - 09/16/16 0001      Ambulation/Gait   Ambulation/Gait Yes   Ambulation/Gait Assistance 6: Modified independent (Device/Increase time);5: Supervision (with SPC)   Ambulation/Gait Assistance Details working on greater "push off" with ball of foot in terminal stance and arm swing. cues for upright gaze.  Worked in [place] with [place] in left hand.   Ambulation Distance (Feet) 300 Feet  +200   Assistive device None;Straight cane   Gait Pattern Step-through pattern;Decreased arm swing - right;Decreased step length - right;Decreased dorsiflexion - right;Decreased trunk rotation;Trunk flexed;Poor foot clearance - right   Ambulation Surface Level;Indoor             Balance Exercises -  09/16/16 0951      Balance Exercises: Standing   Stepping Strategy Anterior;Posterior  cues for body mechanics with weight shifting, progressing with 2 to 0 UE support   Heel Raises Limitations X10, progressing with toe walk.   Toe Raise Limitations X10; progressing with heel walk.           PT Education - 09/16/16 1019    Education provided Yes   Education Details Reviewed walking program, sit<> stands and stepping weight shifitng given 09/12/16.   Person(s) Educated Patient   Methods Explanation;Demonstration;Verbal cues;Handout;Tactile cues   Comprehension Verbalized understanding;Returned demonstration;Verbal cues required          PT Short Term Goals - 09/02/16 1044      PT SHORT TERM GOAL #1   Title Pt will be independent with HEP for improved transfers, balance and mobility.  TARGET 09/28/15   Time 5   Period Weeks   Status On-going     PT SHORT TERM GOAL #2   Title Pt will improve 5x sit<>stand to less than or equal to 20 seconds for decreased fall risk.   Time 5   Period Weeks   Status On-going     PT SHORT TERM GOAL #3   Title Pt will improve TUG score to less than or equal to 13.5 seconds for decreased fall risk.   Period Weeks   Status On-going     PT SHORT TERM GOAL #4   Title Pt will verbalize/demonstrate understanding of body mechanics and positioning, for decreased pain with functional mobility.   Time 5   Period Weeks   Status On-going     PT SHORT TERM GOAL #5   Title Pt will verbalize understanding of local Parkinson's resources.   Time 5   Period Weeks   Status On-going           PT Long Term Goals - 09/02/16 1046      PT LONG TERM GOAL #1   Title Pt will verbalize understanding of fall prevention/tips to reduce freezing episodes of gait.  TARGET 10/26/16   Time 9   Period Weeks   Status On-going     PT LONG TERM GOAL #2   Title Pt will improve 5x sit<>stand to less than or equal to 15 seconds for decreased fall risk.    Time 9    Period Weeks   Status On-going     PT LONG TERM GOAL #3   Title Pt will improve TUG cognitive to less than or equal to 15 seconds for decreased fall risk.   Time 9   Period Weeks   Status On-going     PT LONG TERM GOAL #4   Title Pt will report at least 50% improvement in bed mobility.   Time 9   Period  Weeks   Status On-going     PT LONG TERM GOAL #5   Title Pt will ambulate at least 1000 ft. indoor and outdoor surfaces, without loss of balance, for improved outdoor and community gait..   Time 9   Period Weeks   Status On-going     PT LONG TERM GOAL #6   Title Pt will improve Functional Gait Assessment to at least 20/56 for decreased fall risk.   Time 9   Period Weeks   Status On-going     PT LONG TERM GOAL #7   Title Pt will verbalize plans for continued community fitness upon D/C from PT.   Time 9   Period Weeks   Status On-going               Plan - 09/16/16 1035    Clinical Impression Statement Pt demonstrates understanding of walking program and required min cues for stepping weight shifting strategies.  Pt did not need SPC for back support today but reviewed/trained sequencing with SPC in left hand at supervision level,   Rehab Potential Good   PT Frequency 2x / week   PT Duration 8 weeks   PT Treatment/Interventions ADLs/Self Care Home Management;Functional mobility training;Gait training;Therapeutic activities;Therapeutic exercise;Balance training;Neuromuscular re-education;Patient/family education;Manual techniques;Electrical Stimulation   PT Next Visit Plan Review HEP given this visit; continue to work on standing weightshifting, step strategies   PT Home Exercise Plan back exercises from packet and 3 PWR! moves; 09/12/16-added walking program, sit<>stand from elevated surfaces, standing stagger stance forward/back weightshifting   Consulted and Agree with Plan of Care Patient      Patient will benefit from skilled therapeutic intervention in order to  improve the following deficits and impairments:  Abnormal gait, Decreased balance, Decreased cognition, Decreased mobility, Decreased strength, Difficulty walking, Postural dysfunction, Pain  Visit Diagnosis: Abnormal posture  Unsteadiness on feet  Other abnormalities of gait and mobility     Problem List Patient Active Problem List   Diagnosis Date Noted  . Parkinson disease ([place]) 05/16/2016  . Abnormality of gait 05/16/2016  . Knee pain 11/01/2013  . Traumatic bursitis 11/01/2013  . Hx of fall 11/01/2013  . Memory difficulties 04/22/2013  . Hemorrhoids 02/24/2013  . PRIMARY CENTRAL SLEEP APNEA 05/21/2010  . NOCTURIA 05/21/2010  . HYPERSOMNIA, ASSOCIATED WITH SLEEP APNEA 03/26/2010  . ABDOMINAL PAIN RIGHT LOWER QUADRANT 03/20/2010  . TREMOR, ESSENTIAL 11/22/2009  . SPINAL STENOSIS, LUMBAR 06/07/2009  . OSTEOARTHRITIS, LOWER LEG, LEFT 05/16/2009  . MYOFASCIAL PAIN SYNDROME 05/16/2009  . URINARY FREQUENCY, CHRONIC 02/09/2009  . DISUSE OSTEOPOROSIS 09/07/2008  . HYPOGONADISM, MALE 07/01/2008  . HYPOKALEMIA 04/19/2008  . SYNCOPE 04/19/2008  . UNS ADVRS EFF UNS RX MEDICINAL&BIOLOGICAL SBSTNC 02/26/2008  . CONSTIPATION, CHRONIC 11/23/2007  . HYPERLIPIDEMIA 05/15/2007  . Right-sided low back pain without sciatica 05/15/2007  . Osteoporosis 05/15/2007  . DIVERTICULOSIS, COLON 02/23/2007  . Essential hypertension 02/12/2007  . GERD 02/12/2007  . GASTRITIS 02/12/2007    Bjorn Loser, PTA  09/16/16, 10:42 AM [facility] [address] [place], [place], [address] Phone: [phone]   Fax:  [phone]  Name: [name]. MRN: [id number] Date of Birth: 1946-06-03

## 2016-09-16 NOTE — Patient Instructions (Signed)
WALKING  Walking is a great form of exercise to increase your strength, endurance and overall fitness.  A walking program can help you start slowly and gradually build endurance as you go.  Everyone's ability is different, so each person's starting point will be different.  You do not have to follow them exactly.  The are just samples. You should simply find out what's right for you and stick to that program.   In the beginning, you'll start off walking 2-3 times a day for short distances.  As you get stronger, you'll be walking further at just 1-2 times per day.  A. You Can Walk For A Certain Length Of Time Each Day    Walk 3-4 minutes 3 times per day.  Try this in your home, using your cane.  Increase 1-2 minutes every several days (3 times per day).  Work up to 8-10 minutes (1-2 times per day).  Make sure to think of taking long strides and relaxed arm swing with walking.     Sit to Stand Transfers:  (TRY TO DO THIS FROM A BED HEIGHT, REPEATED 5-10 TIMES, 1-2 TIMES PER DAY FOR LEG STRENGTHENING)  1. Scoot out to the edge of the chair 2. Place your feet flat on the floor, shoulder width apart.  Make sure your feet are tucked just under your knees. 3. Lean forward (nose over toes) with momentum, and stand up tall with your best posture.  If you need to use your arms, use them as a quick boost up to stand. 4. If you are in a low or soft chair, you can lean back and then forward up to stand, in order to get more momentum. 5. Once you are standing, make sure you are looking ahead and standing tall.  To sit down:  1. Back up until you feel the chair behind your legs. 2. Bend at you hips, reaching  Back for you chair, if needed, then slowly squat to sit down on your chair.    Provided handout for stagger stance forward/back weightshift x 10 reps each foot position. (standing beside counter with support as needed)

## 2016-09-19 ENCOUNTER — Ambulatory Visit: Payer: PPO | Attending: Neurology | Admitting: Physical Therapy

## 2016-09-19 DIAGNOSIS — R2681 Unsteadiness on feet: Secondary | ICD-10-CM | POA: Insufficient documentation

## 2016-09-19 DIAGNOSIS — R2689 Other abnormalities of gait and mobility: Secondary | ICD-10-CM | POA: Insufficient documentation

## 2016-09-19 DIAGNOSIS — R293 Abnormal posture: Secondary | ICD-10-CM

## 2016-09-19 DIAGNOSIS — R29818 Other symptoms and signs involving the nervous system: Secondary | ICD-10-CM | POA: Insufficient documentation

## 2016-09-19 NOTE — Patient Instructions (Addendum)
Back [place]    Place hands at hips or on lower back and sit up tall/stand up tall, to find your best posture.  You want to do this as often (and as soon) as you realize that you are leaning forward with your posture.  Hold _10-30__ seconds. Repeat throughout the day as you notice your posture getting more rounded, to correct your posture and to strengthen your back muscles.  http://gt2.exer.us/559   Copyright  VHI. All rights reserved.

## 2016-09-19 NOTE — Therapy (Signed)
[facility] [address] [place], [place], [address] Phone: [phone]   Fax:  [phone]  Physical Therapy Treatment  Patient Details  Name: [name]. MRN: [id number] Date of Birth: Mar 18, 1946 Referring Provider: Jannifer Franklin  Encounter Date: 09/19/2016      PT End of Session - 09/19/16 1319    Visit Number 7   Number of Visits 18   Date for PT Re-Evaluation 10/26/16   Authorization Type [employer] every 10th visit   PT Start Time 0933   PT Stop Time 1018   PT Time Calculation (min) 45 min   Activity Tolerance Patient tolerated treatment well   Behavior During Therapy [facility] for tasks assessed/performed      Past Medical History:  Diagnosis Date  . Abdominal pain, right lower quadrant   . Abnormality of gait 05/16/2016  . Acute prostatitis   . Anxiety   . Constipation   . Degenerative disc disease   . Depression   . Diverticulosis of colon (without mention of hemorrhage)   . Essential and other specified forms of tremor   . Family history of malignant neoplasm of gastrointestinal tract   . Fibromyalgia   . Ganglion and cyst of synovium, tendon, and bursa   . GERD (gastroesophageal reflux disease)   . Hemarthrosis, upper arm   . Hyperlipidemia   . Hypersomnia with sleep apnea, unspecified   . Hypertension   . Hypopotassemia   . Localized osteoarthrosis not specified whether primary or secondary, lower leg   . Lumbago   . Memory difficulties 04/22/2013  . Nocturia   . Osteoporosis, unspecified   . Other testicular hypofunction   . Parkinson disease ([place]) 05/16/2016  . Right inguinal hernia   . Sleep apnea    last sleep study 11/11 on chart- Bipap with settings of 4 per  patient  . Spinal stenosis, lumbar region, without neurogenic claudication   . Syncope and collapse   . Unspecified adverse effect of unspecified drug, medicinal and biological substance   . Unspecified gastritis and  gastroduodenitis without mention of hemorrhage   . Urinary frequency     Past Surgical History:  Procedure Laterality Date  . BACK SURGERY     x 3  . [place]  . INGUINAL HERNIA REPAIR  07/19/2011   Procedure: LAPAROSCOPIC INGUINAL HERNIA;  Surgeon: Odis Hollingshead, MD;  Location: [facility];  Service: General;  Laterality: Right;  Laparoscopic Repair of Recurrent Right  Ingunial Hernia with Mesh  . microdisectomy  03/02/2003, 06/29/2003, 12/24/2005  . TONSILLECTOMY      There were no vitals filed for this visit.      Subjective Assessment - 09/19/16 0937    Subjective Have had some significant pain in low back, especially when I feel off balance, the pain is more sharp   Pertinent History degenerative disc disease, scoliosis, Parkinson's disease recent diagnosis in past 6 months, neuropathy   Patient Stated Goals Pt's goal for therapy is to help with pain and improve better balance and getting up from chairs.   Currently in Pain? Yes   Pain Score 6    Pain Location Back   Pain Orientation Right;Left;Lower   Pain Onset More than a month ago   Pain Frequency Constant   Aggravating Factors  getting off balance aggravates pain   Pain Relieving Factors sitting, resting  [place] Adult PT Treatment/Exercise - 09/19/16 1255      Ambulation/Gait   Ambulation/Gait Yes   Ambulation/Gait Assistance 6: Modified independent (Device/Increase time);5: Supervision   Ambulation Distance (Feet) 240 Feet  x 2   Assistive device None   Gait Pattern Step-through pattern;Decreased arm swing - right;Decreased step length - right;Decreased dorsiflexion - right;Decreased trunk rotation;Trunk flexed;Poor foot clearance - right   Ambulation Surface Level;Indoor   Gait Comments Cues for upright posture, increased recipocal arm swing with gait.  No c/o increase in pain.     Posture/Postural Control   Posture Comments Discussed postural awareness, as  pt feels when he is more forward flexed for prolonged periods, that is when pain increases.  Discussed being aware of forward flexed posture and correcting into best upright posture in sitting and standing (lumbar/dorsal extension)-practiced several reps and added to HEP     Neuro Re-ed    Neuro Re-ed Details  Forward step and weightshift x 10 reps, back step and weigthshift x 10 reps, then forward<>band step and weightshift x 10 reps, lateral weightshift x 10 reps with added reaching for stretch (pt c/o pain in R low back area); stagger stance forward/back weightshifting x 10 reps each foot position     Manual Therapy   Manual Therapy Other (comment)   Manual therapy comments Pt c/o pain slightly lower than 6/10 following massage   Other Manual Therapy Massage to R low back, erector spinae, external oblique/internal oblique, near 12th rib, x 8 minutes, with pt reporting tenderness just above iliac crest area           PWR [facility]) - 09/19/16 1308    PWR! Up x 10 reps  modifed at counter   PWR! Rock x 10  modified at counter   Comments Modified quadruped position at counter, no c/o back pain increase with above exercises             PT Education - 09/19/16 1318    Education provided Yes   Education Details Posture exercise for increased lumbar extension to offset forward flexed posture.   Person(s) Educated Patient   Methods Explanation;Demonstration;Handout   Comprehension Verbalized understanding;Returned demonstration          PT Short Term Goals - 09/02/16 1044      PT SHORT TERM GOAL #1   Title Pt will be independent with HEP for improved transfers, balance and mobility.  TARGET 09/28/15   Time 5   Period Weeks   Status On-going     PT SHORT TERM GOAL #2   Title Pt will improve 5x sit<>stand to less than or equal to 20 seconds for decreased fall risk.   Time 5   Period Weeks   Status On-going     PT SHORT TERM GOAL #3   Title Pt will improve TUG score to less  than or equal to 13.5 seconds for decreased fall risk.   Period Weeks   Status On-going     PT SHORT TERM GOAL #4   Title Pt will verbalize/demonstrate understanding of body mechanics and positioning, for decreased pain with functional mobility.   Time 5   Period Weeks   Status On-going     PT SHORT TERM GOAL #5   Title Pt will verbalize understanding of local Parkinson's resources.   Time 5   Period Weeks   Status On-going           PT Long Term Goals - 09/02/16 1046  PT LONG TERM GOAL #1   Title Pt will verbalize understanding of fall prevention/tips to reduce freezing episodes of gait.  TARGET 10/26/16   Time 9   Period Weeks   Status On-going     PT LONG TERM GOAL #2   Title Pt will improve 5x sit<>stand to less than or equal to 15 seconds for decreased fall risk.    Time 9   Period Weeks   Status On-going     PT LONG TERM GOAL #3   Title Pt will improve TUG cognitive to less than or equal to 15 seconds for decreased fall risk.   Time 9   Period Weeks   Status On-going     PT LONG TERM GOAL #4   Title Pt will report at least 50% improvement in bed mobility.   Time 9   Period Weeks   Status On-going     PT LONG TERM GOAL #5   Title Pt will ambulate at least 1000 ft. indoor and outdoor surfaces, without loss of balance, for improved outdoor and community gait..   Time 9   Period Weeks   Status On-going     PT LONG TERM GOAL #6   Title Pt will improve Functional Gait Assessment to at least 20/56 for decreased fall risk.   Time 9   Period Weeks   Status On-going     PT LONG TERM GOAL #7   Title Pt will verbalize plans for continued community fitness upon D/C from PT.   Time 9   Period Weeks   Status On-going               Plan - 09/19/16 1321    Clinical Impression Statement Pt appears to demonstrate good understanding of exercises and use of postural strategies for improved posture with standing and sitting.  Worked on posture exercises  as well as weightshifting and balance activities for improved balance.  Pt's pain continues to be a limiting factor to overall ease of movement.     Rehab Potential Good   PT Frequency 2x / week   PT Duration 8 weeks   PT Treatment/Interventions ADLs/Self Care Home Management;Functional mobility training;Gait training;Therapeutic activities;Therapeutic exercise;Balance training;Neuromuscular re-education;Patient/family education;Manual techniques;Electrical Stimulation   PT Next Visit Plan Check STGs and discuss POC; add modified quadruped PWR! Moves as needed for HEP; massage to R lumbar musculature if pt reports relief from this visit?   PT Home Exercise Plan back exercises from packet and 3 PWR! moves; 09/12/16-added walking program, sit<>stand from elevated surfaces, standing stagger stance forward/back weightshifting   Consulted and Agree with Plan of Care Patient      Patient will benefit from skilled therapeutic intervention in order to improve the following deficits and impairments:  Abnormal gait, Decreased balance, Decreased cognition, Decreased mobility, Decreased strength, Difficulty walking, Postural dysfunction, Pain  Visit Diagnosis: Abnormal posture  Unsteadiness on feet     Problem List Patient Active Problem List   Diagnosis Date Noted  . Parkinson disease ([place]) 05/16/2016  . Abnormality of gait 05/16/2016  . Knee pain 11/01/2013  . Traumatic bursitis 11/01/2013  . Hx of fall 11/01/2013  . Memory difficulties 04/22/2013  . Hemorrhoids 02/24/2013  . PRIMARY CENTRAL SLEEP APNEA 05/21/2010  . NOCTURIA 05/21/2010  . HYPERSOMNIA, ASSOCIATED WITH SLEEP APNEA 03/26/2010  . ABDOMINAL PAIN RIGHT LOWER QUADRANT 03/20/2010  . TREMOR, ESSENTIAL 11/22/2009  . SPINAL STENOSIS, LUMBAR 06/07/2009  . OSTEOARTHRITIS, LOWER LEG, LEFT 05/16/2009  . MYOFASCIAL PAIN SYNDROME  05/16/2009  . URINARY FREQUENCY, CHRONIC 02/09/2009  . DISUSE OSTEOPOROSIS 09/07/2008  . HYPOGONADISM, MALE  07/01/2008  . HYPOKALEMIA 04/19/2008  . SYNCOPE 04/19/2008  . UNS ADVRS EFF UNS RX MEDICINAL&BIOLOGICAL SBSTNC 02/26/2008  . CONSTIPATION, CHRONIC 11/23/2007  . HYPERLIPIDEMIA 05/15/2007  . Right-sided low back pain without sciatica 05/15/2007  . Osteoporosis 05/15/2007  . DIVERTICULOSIS, COLON 02/23/2007  . Essential hypertension 02/12/2007  . GERD 02/12/2007  . GASTRITIS 02/12/2007    MARRIOTT,AMY W. 09/19/2016, 2:29 PM  Frazier Butt., PT [facility] [address] [place], [place], [address] Phone: [phone]   Fax:  [phone]  Name: [name]. MRN: [id number] Date of Birth: [birth year]

## 2016-09-24 ENCOUNTER — Ambulatory Visit: Payer: PPO | Admitting: Physical Therapy

## 2016-09-24 DIAGNOSIS — R2689 Other abnormalities of gait and mobility: Secondary | ICD-10-CM

## 2016-09-24 DIAGNOSIS — R29818 Other symptoms and signs involving the nervous system: Secondary | ICD-10-CM

## 2016-09-24 DIAGNOSIS — R293 Abnormal posture: Secondary | ICD-10-CM | POA: Diagnosis not present

## 2016-09-24 NOTE — Therapy (Signed)
[facility] [address] [place], [place], [address] Phone: [phone]   Fax:  [phone]  Physical Therapy Treatment  Patient Details  Name: [name]. MRN: [id number] Date of Birth: [birth year] Referring Provider: Jannifer Franklin  Encounter Date: 09/24/2016      PT End of Session - 09/24/16 1923    Visit Number 8   Number of Visits 18   Date for PT Re-Evaluation 10/26/16   Authorization Type [employer] every 10th visit   PT Start Time 1149   PT Stop Time 1232   PT Time Calculation (min) 43 min   Activity Tolerance Patient tolerated treatment well   Behavior During Therapy [facility] for tasks assessed/performed      Past Medical History:  Diagnosis Date  . Abdominal pain, right lower quadrant   . Abnormality of gait 05/16/2016  . Acute prostatitis   . Anxiety   . Constipation   . Degenerative disc disease   . Depression   . Diverticulosis of colon (without mention of hemorrhage)   . Essential and other specified forms of tremor   . Family history of malignant neoplasm of gastrointestinal tract   . Fibromyalgia   . Ganglion and cyst of synovium, tendon, and bursa   . GERD (gastroesophageal reflux disease)   . Hemarthrosis, upper arm   . Hyperlipidemia   . Hypersomnia with sleep apnea, unspecified   . Hypertension   . Hypopotassemia   . Localized osteoarthrosis not specified whether primary or secondary, lower leg   . Lumbago   . Memory difficulties 04/22/2013  . Nocturia   . Osteoporosis, unspecified   . Other testicular hypofunction   . Parkinson disease ([place]) 05/16/2016  . Right inguinal hernia   . Sleep apnea    last sleep study 11/11 on chart- Bipap with settings of 4 per  patient  . Spinal stenosis, lumbar region, without neurogenic claudication   . Syncope and collapse   . Unspecified adverse effect of unspecified drug, medicinal and biological substance   . Unspecified gastritis and  gastroduodenitis without mention of hemorrhage   . Urinary frequency     Past Surgical History:  Procedure Laterality Date  . BACK SURGERY     x 3  . [place]  . INGUINAL HERNIA REPAIR  07/19/2011   Procedure: LAPAROSCOPIC INGUINAL HERNIA;  Surgeon: Odis Hollingshead, MD;  Location: [facility];  Service: General;  Laterality: Right;  Laparoscopic Repair of Recurrent Right  Ingunial Hernia with Mesh  . microdisectomy  03/02/2003, 06/29/2003, 12/24/2005  . TONSILLECTOMY      There were no vitals filed for this visit.      Subjective Assessment - 09/24/16 1151    Subjective Feel more stiff today, just plain hurting.   Pertinent History degenerative disc disease, scoliosis, Parkinson's disease recent diagnosis in past 6 months, neuropathy   Patient Stated Goals Pt's goal for therapy is to help with pain and improve better balance and getting up from chairs.   Currently in Pain? Yes   Pain Score 6    Pain Location Back   Pain Orientation Right;Lower   Pain Descriptors / Indicators Aching   Pain Type Chronic pain   Pain Onset More than a month ago   Pain Frequency Constant   Aggravating Factors  getting off balance aggravates pain   Pain Relieving Factors sitting, resting  [place] Adult PT Treatment/Exercise - 09/24/16 0001      Bed Mobility   Bed Mobility Rolling Right;Rolling Left;Right Sidelying to Sit   Rolling Right 5: Supervision   Rolling Right Details (indicate cue type and reason) verbal cues to use UE and lower extremities together for improved momentum to roll   Rolling Left 5: Supervision   Rolling Left Details (indicate cue type and reason) same as above (right side)   Right Sidelying to Sit 5: Supervision  requires extra time     Transfers   Transfers Sit to Stand;Stand to Sit   Sit to Stand 6: Modified independent (Device/Increase time);Without upper extremity assist;From chair/3-in-1;With upper extremity  assist;From bed   Five time sit to stand comments  14.94   Stand to Sit 6: Modified independent (Device/Increase time);Without upper extremity assist;To chair/3-in-1;With upper extremity assist;To bed   Comments Practiced, discussed fully turning around prior to sitting to avoid uncontrolled descent; discussed/practiced use of UE's as "boost" for improved ease of standing     Standardized Balance Assessment   Standardized Balance Assessment Timed Up and Go Test     Timed Up and Go Test   TUG Normal TUG   Normal TUG (seconds) 17  second trial:  14.97 sec     Self-Care   Self-Care Other Self-Care Comments   Other Self-Care Comments  Discussed POC, including progress towards goals.  Discussed importance of exercises in HEP as well as posture/positioning; discussed continued back pain (pt typically rates at 6/10).  Pt in agreement to continueing PT towards LTGs, with planned focus on pt's functional activities, including car transfers, dynamic gait and bed mobility, with cues and practice for intention/large amplitude movement for improved ease of movement patterns.  (will continue to monitor pain, but not focus on pain as goal, due to chronic in nature and PT not currently decreaseing pain.)     Neuro Re-ed    Neuro Re-ed Details  Forward <>back step and weightshift x 10 reps, lateral weightshift x 10 reps; stagger stance forward/back weightshifting x 10 reps each foot position     Verbal and demo review of HEP-pt verbalizes and return demo understanding.      PWR [facility]) - 09/24/16 1918    PWR! Up x 10  modified at counter   PWR! Step x10   Modified at counter             PT Education - 09/24/16 1921    Education provided Yes   Education Details POC, progress towards goals; plan to continue towards LTGs, with pt in agreement to focus on functional mobility activities-car transfers, gait, bed mobility-versus focusing on pain   Person(s) Educated Patient   Methods Explanation    Comprehension Verbalized understanding          PT Short Term Goals - 09/24/16 1157      PT SHORT TERM GOAL #1   Title Pt will be independent with HEP for improved transfers, balance and mobility.  TARGET 09/28/15   Time 5   Period Weeks   Status Achieved     PT SHORT TERM GOAL #2   Title Pt will improve 5x sit<>stand to less than or equal to 20 seconds for decreased fall risk.   Baseline 14.94 sec 09/24/16   Time 5   Period Weeks   Status Achieved     PT SHORT TERM GOAL #3   Title Pt will improve TUG score to less than or equal to 13.5 seconds for  decreased fall risk.   Baseline 14.97 sec at best 09/24/16   Period Weeks   Status Not Met     PT SHORT TERM GOAL #4   Title Pt will verbalize/demonstrate understanding of body mechanics and positioning, for decreased pain with functional mobility.   Time 5   Period Weeks   Status Achieved     PT SHORT TERM GOAL #5   Title Pt will verbalize understanding of local Parkinson's resources.   Time 5   Period Weeks   Status On-going           PT Long Term Goals - 09/02/16 1046      PT LONG TERM GOAL #1   Title Pt will verbalize understanding of fall prevention/tips to reduce freezing episodes of gait.  TARGET 10/26/16   Time 9   Period Weeks   Status On-going     PT LONG TERM GOAL #2   Title Pt will improve 5x sit<>stand to less than or equal to 15 seconds for decreased fall risk.    Time 9   Period Weeks   Status On-going     PT LONG TERM GOAL #3   Title Pt will improve TUG cognitive to less than or equal to 15 seconds for decreased fall risk.   Time 9   Period Weeks   Status On-going     PT LONG TERM GOAL #4   Title Pt will report at least 50% improvement in bed mobility.   Time 9   Period Weeks   Status On-going     PT LONG TERM GOAL #5   Title Pt will ambulate at least 1000 ft. indoor and outdoor surfaces, without loss of balance, for improved outdoor and community gait..   Time 9   Period Weeks   Status  On-going     PT LONG TERM GOAL #6   Title Pt will improve Functional Gait Assessment to at least 20/56 for decreased fall risk.   Time 9   Period Weeks   Status On-going     PT LONG TERM GOAL #7   Title Pt will verbalize plans for continued community fitness upon D/C from PT.   Time 9   Period Weeks   Status On-going               Plan - 09/24/16 1924    Clinical Impression Statement Pt has met STG 1, 2, 4.  STG 3 not met.  STG 5 ongoing.  Pt has improved with sit<>stand transfers, and agrees to focus on car transfer technique, dynamic balance/gait, and bed mobility with large amplitude movement patterns for improved ease of movement.   Rehab Potential Good   PT Frequency 2x / week   PT Duration 8 weeks   PT Treatment/Interventions ADLs/Self Care Home Management;Functional mobility training;Gait training;Therapeutic activities;Therapeutic exercise;Balance training;Neuromuscular re-education;Patient/family education;Manual techniques;Electrical Stimulation   PT Next Visit Plan Car transfer technique, bed mobility, dynamic gait and balance activities (large intensity movement patterns for improved ease of movement); provide info on [employer] Parkinson's group   PT Home Exercise Plan back exercises from packet and 3 PWR! moves; 09/12/16-added walking program, sit<>stand from elevated surfaces, standing stagger stance forward/back weightshifting   Consulted and Agree with Plan of Care Patient      Patient will benefit from skilled therapeutic intervention in order to improve the following deficits and impairments:  Abnormal gait, Decreased balance, Decreased cognition, Decreased mobility, Decreased strength, Difficulty walking, Postural dysfunction, Pain  Visit Diagnosis:  Other symptoms and signs involving the nervous system  Other abnormalities of gait and mobility     Problem List Patient Active Problem List   Diagnosis Date Noted  . Parkinson disease ([place]) 05/16/2016   . Abnormality of gait 05/16/2016  . Knee pain 11/01/2013  . Traumatic bursitis 11/01/2013  . Hx of fall 11/01/2013  . Memory difficulties 04/22/2013  . Hemorrhoids 02/24/2013  . PRIMARY CENTRAL SLEEP APNEA 05/21/2010  . NOCTURIA 05/21/2010  . HYPERSOMNIA, ASSOCIATED WITH SLEEP APNEA 03/26/2010  . ABDOMINAL PAIN RIGHT LOWER QUADRANT 03/20/2010  . TREMOR, ESSENTIAL 11/22/2009  . SPINAL STENOSIS, LUMBAR 06/07/2009  . OSTEOARTHRITIS, LOWER LEG, LEFT 05/16/2009  . MYOFASCIAL PAIN SYNDROME 05/16/2009  . URINARY FREQUENCY, CHRONIC 02/09/2009  . DISUSE OSTEOPOROSIS 09/07/2008  . HYPOGONADISM, MALE 07/01/2008  . HYPOKALEMIA 04/19/2008  . SYNCOPE 04/19/2008  . UNS ADVRS EFF UNS RX MEDICINAL&BIOLOGICAL SBSTNC 02/26/2008  . CONSTIPATION, CHRONIC 11/23/2007  . HYPERLIPIDEMIA 05/15/2007  . Right-sided low back pain without sciatica 05/15/2007  . Osteoporosis 05/15/2007  . DIVERTICULOSIS, COLON 02/23/2007  . Essential hypertension 02/12/2007  . GERD 02/12/2007  . GASTRITIS 02/12/2007    Aryona Sill W. 09/24/2016, 7:29 PM  Frazier Butt., PT  [facility] [address] [place], [place], [address] Phone: [phone]   Fax:  [phone]  Name: [name]. MRN: [id number] Date of Birth: 20-Mar-1946

## 2016-09-26 ENCOUNTER — Ambulatory Visit: Payer: PPO | Admitting: Rehabilitation

## 2016-09-26 ENCOUNTER — Encounter: Payer: Self-pay | Admitting: Rehabilitation

## 2016-09-26 DIAGNOSIS — R29818 Other symptoms and signs involving the nervous system: Secondary | ICD-10-CM

## 2016-09-26 DIAGNOSIS — R293 Abnormal posture: Secondary | ICD-10-CM

## 2016-09-26 DIAGNOSIS — R2681 Unsteadiness on feet: Secondary | ICD-10-CM

## 2016-09-26 DIAGNOSIS — R2689 Other abnormalities of gait and mobility: Secondary | ICD-10-CM

## 2016-09-26 NOTE — Therapy (Signed)
[facility] [address] [place], [place], [address] Phone: [phone]   Fax:  [phone]  Physical Therapy Treatment  Patient Details  Name: [name]. MRN: [id number] Date of Birth: 1946/06/23 Referring Provider: Jannifer Franklin  Encounter Date: 09/26/2016      PT End of Session - 09/26/16 0939    Visit Number 9   Number of Visits 18   Date for PT Re-Evaluation 10/26/16   Authorization Type [employer] every 10th visit   PT Start Time 0935  pt late to session   PT Stop Time 1015   PT Time Calculation (min) 40 min   Activity Tolerance Patient tolerated treatment well   Behavior During Therapy [facility] for tasks assessed/performed      Past Medical History:  Diagnosis Date  . Abdominal pain, right lower quadrant   . Abnormality of gait 05/16/2016  . Acute prostatitis   . Anxiety   . Constipation   . Degenerative disc disease   . Depression   . Diverticulosis of colon (without mention of hemorrhage)   . Essential and other specified forms of tremor   . Family history of malignant neoplasm of gastrointestinal tract   . Fibromyalgia   . Ganglion and cyst of synovium, tendon, and bursa   . GERD (gastroesophageal reflux disease)   . Hemarthrosis, upper arm   . Hyperlipidemia   . Hypersomnia with sleep apnea, unspecified   . Hypertension   . Hypopotassemia   . Localized osteoarthrosis not specified whether primary or secondary, lower leg   . Lumbago   . Memory difficulties 04/22/2013  . Nocturia   . Osteoporosis, unspecified   . Other testicular hypofunction   . Parkinson disease ([place]) 05/16/2016  . Right inguinal hernia   . Sleep apnea    last sleep study 11/11 on chart- Bipap with settings of 4 per  patient  . Spinal stenosis, lumbar region, without neurogenic claudication   . Syncope and collapse   . Unspecified adverse effect of unspecified drug, medicinal and biological substance   . Unspecified  gastritis and gastroduodenitis without mention of hemorrhage   . Urinary frequency     Past Surgical History:  Procedure Laterality Date  . BACK SURGERY     x 3  . [place]  . INGUINAL HERNIA REPAIR  07/19/2011   Procedure: LAPAROSCOPIC INGUINAL HERNIA;  Surgeon: Odis Hollingshead, MD;  Location: [facility];  Service: General;  Laterality: Right;  Laparoscopic Repair of Recurrent Right  Ingunial Hernia with Mesh  . microdisectomy  03/02/2003, 06/29/2003, 12/24/2005  . TONSILLECTOMY      There were no vitals filed for this visit.      Subjective Assessment - 09/26/16 0937    Subjective "I'm feeling pretty stiff today.  The exercises are bothering my back."    Pertinent History degenerative disc disease, scoliosis, Parkinson's disease recent diagnosis in past 6 months, neuropathy   Patient Stated Goals Pt's goal for therapy is to help with pain and improve better balance and getting up from chairs.   Currently in Pain? Yes   Pain Score 6    Pain Location Back   Pain Orientation Right;Lower   Pain Descriptors / Indicators Aching   Pain Type Chronic pain   Pain Onset More than a month ago   Pain Frequency Constant   Aggravating Factors  getting off balance aggravates pain   Pain Relieving Factors sitting, resting, heat  [facility] Adult PT Treatment/Exercise - 09/26/16 1000      Bed Mobility   Bed Mobility Rolling Right;Rolling Left;Right Sidelying to Sit   Rolling Right 5: Supervision   Rolling Right Details (indicate cue type and reason) cues for log roll and bringing UE across body   Rolling Left 5: Supervision   Rolling Left Details (indicate cue type and reason) see cues above   Right Sidelying to Sit 5: Supervision   Right Sidelying to Sit Details (indicate cue type and reason) Cues for correct technique to maintain back from twisting     Ambulation/Gait   Ambulation/Gait Yes   Ambulation/Gait Assistance 5: Supervision;6:  Modified independent (Device/Increase time)   Ambulation/Gait Assistance Details Performed gait x 600' with emphasis on upright posture, increased step length and improved B arm swing.  Pt did well taking large steps, but requires constant cues for posture and arm swing as when he would adjust for posture, he would decrease arm swing and vice versa.     Ambulation Distance (Feet) 600 Feet   Assistive device None   Gait Pattern Step-through pattern;Decreased arm swing - right;Decreased step length - right;Decreased dorsiflexion - right;Decreased trunk rotation;Trunk flexed;Poor foot clearance - right   Ambulation Surface Level;Indoor     Neuro Re-ed    Neuro Re-ed Details  Forward/backward weight shifts at counter top with emphasis on coming up on back toe and rocking backwards to opposite heel.  Performed on each LE x 10 reps.  PWR rotation in standing at counter top x 10 reps each direction with cues for improved push through outward LE.       Exercises   Other Exercises  Performed sit<>stand x 10 reps with emphasis on big open arm movements when standing and reaching forward with arms when sitting.  PWR marching to simulate getting into/out of car x 10 reps on each side.  Note marked difficulty with exercise, esp on the R side and esp when fatigued.  Supine PWR rotation x 10 reps and following rotation exercise, worked on bed mobility for proper log roll technique to protect back.  Pt able to do great log roll technique and educated on why this is important for home.                  PT Education - 09/26/16 1115    Education provided Yes   Education Details log roll technique for bed mobility, getting in/out of car using PWR moves and improved forward weight shift.     Person(s) Educated Patient   Methods Explanation   Comprehension Verbalized understanding          PT Short Term Goals - 09/24/16 1157      PT SHORT TERM GOAL #1   Title Pt will be independent with HEP for  improved transfers, balance and mobility.  TARGET 09/28/15   Time 5   Period Weeks   Status Achieved     PT SHORT TERM GOAL #2   Title Pt will improve 5x sit<>stand to less than or equal to 20 seconds for decreased fall risk.   Baseline 14.94 sec 09/24/16   Time 5   Period Weeks   Status Achieved     PT SHORT TERM GOAL #3   Title Pt will improve TUG score to less than or equal to 13.5 seconds for decreased fall risk.   Baseline 14.97 sec at best 09/24/16   Period Weeks   Status Not Met     PT  SHORT TERM GOAL #4   Title Pt will verbalize/demonstrate understanding of body mechanics and positioning, for decreased pain with functional mobility.   Time 5   Period Weeks   Status Achieved     PT SHORT TERM GOAL #5   Title Pt will verbalize understanding of local Parkinson's resources.   Time 5   Period Weeks   Status On-going           PT Long Term Goals - 09/02/16 1046      PT LONG TERM GOAL #1   Title Pt will verbalize understanding of fall prevention/tips to reduce freezing episodes of gait.  TARGET 10/26/16   Time 9   Period Weeks   Status On-going     PT LONG TERM GOAL #2   Title Pt will improve 5x sit<>stand to less than or equal to 15 seconds for decreased fall risk.    Time 9   Period Weeks   Status On-going     PT LONG TERM GOAL #3   Title Pt will improve TUG cognitive to less than or equal to 15 seconds for decreased fall risk.   Time 9   Period Weeks   Status On-going     PT LONG TERM GOAL #4   Title Pt will report at least 50% improvement in bed mobility.   Time 9   Period Weeks   Status On-going     PT LONG TERM GOAL #5   Title Pt will ambulate at least 1000 ft. indoor and outdoor surfaces, without loss of balance, for improved outdoor and community gait..   Time 9   Period Weeks   Status On-going     PT LONG TERM GOAL #6   Title Pt will improve Functional Gait Assessment to at least 20/56 for decreased fall risk.   Time 9   Period Weeks   Status  On-going     PT LONG TERM GOAL #7   Title Pt will verbalize plans for continued community fitness upon D/C from PT.   Time 9   Period Weeks   Status On-going               Plan - 09/26/16 1116    Clinical Impression Statement Skilled session focused on sit<>stand, PWR! Transition moves to simulate getting into/out of car, supine PWR rotation for improved bed mobility, and forward/backwards as well as lateral movements to carryover to improved balance and mobility with upright posture.     Rehab Potential Good   PT Frequency 2x / week   PT Duration 8 weeks   PT Treatment/Interventions ADLs/Self Care Home Management;Functional mobility training;Gait training;Therapeutic activities;Therapeutic exercise;Balance training;Neuromuscular re-education;Patient/family education;Manual techniques;Electrical Stimulation   PT Next Visit Plan GCODE and PN!! Car transfer technique, bed mobility, dynamic gait and balance activities (large intensity movement patterns for improved ease of movement); provide info on [employer] Parkinson's group-Redmond Whittley did not give this to patient as I was unable to find exact info and Levada Dy had a pt so please give to him.    PT Home Exercise Plan back exercises from packet and 3 PWR! moves; 09/12/16-added walking program, sit<>stand from elevated surfaces, standing stagger stance forward/back weightshifting   Consulted and Agree with Plan of Care Patient      Patient will benefit from skilled therapeutic intervention in order to improve the following deficits and impairments:  Abnormal gait, Decreased balance, Decreased cognition, Decreased mobility, Decreased strength, Difficulty walking, Postural dysfunction, Pain  Visit Diagnosis: Unsteadiness on feet  Other symptoms and signs involving the nervous system  Other abnormalities of gait and mobility  Abnormal posture     Problem List Patient Active Problem List   Diagnosis Date Noted  . Parkinson disease  ([place]) 05/16/2016  . Abnormality of gait 05/16/2016  . Knee pain 11/01/2013  . Traumatic bursitis 11/01/2013  . Hx of fall 11/01/2013  . Memory difficulties 04/22/2013  . Hemorrhoids 02/24/2013  . PRIMARY CENTRAL SLEEP APNEA 05/21/2010  . NOCTURIA 05/21/2010  . HYPERSOMNIA, ASSOCIATED WITH SLEEP APNEA 03/26/2010  . ABDOMINAL PAIN RIGHT LOWER QUADRANT 03/20/2010  . TREMOR, ESSENTIAL 11/22/2009  . SPINAL STENOSIS, LUMBAR 06/07/2009  . OSTEOARTHRITIS, LOWER LEG, LEFT 05/16/2009  . MYOFASCIAL PAIN SYNDROME 05/16/2009  . URINARY FREQUENCY, CHRONIC 02/09/2009  . DISUSE OSTEOPOROSIS 09/07/2008  . HYPOGONADISM, MALE 07/01/2008  . HYPOKALEMIA 04/19/2008  . SYNCOPE 04/19/2008  . UNS ADVRS EFF UNS RX MEDICINAL&BIOLOGICAL SBSTNC 02/26/2008  . CONSTIPATION, CHRONIC 11/23/2007  . HYPERLIPIDEMIA 05/15/2007  . Right-sided low back pain without sciatica 05/15/2007  . Osteoporosis 05/15/2007  . DIVERTICULOSIS, COLON 02/23/2007  . Essential hypertension 02/12/2007  . GERD 02/12/2007  . GASTRITIS 02/12/2007    Cameron Sprang, PT, MPT [facility] [address] [place], [place], [address] Phone: [phone]   Fax:  [phone] 09/26/16, 11:20 AM  Name: Rayshun Kandler. MRN: [id number] Date of Birth: 1945/08/23

## 2016-09-30 ENCOUNTER — Ambulatory Visit: Payer: PPO | Admitting: Physical Therapy

## 2016-09-30 DIAGNOSIS — R2681 Unsteadiness on feet: Secondary | ICD-10-CM

## 2016-09-30 DIAGNOSIS — R2689 Other abnormalities of gait and mobility: Secondary | ICD-10-CM

## 2016-09-30 DIAGNOSIS — R29818 Other symptoms and signs involving the nervous system: Secondary | ICD-10-CM

## 2016-09-30 DIAGNOSIS — R293 Abnormal posture: Secondary | ICD-10-CM

## 2016-09-30 NOTE — Therapy (Signed)
[facility] [address] [place], [place], [address] Phone: [phone]   Fax:  [phone]  Physical Therapy Treatment  Patient Details  Name: [name]. MRN: [id number] Date of Birth: 11-12-1945 Referring Provider: Jannifer Franklin  Encounter Date: 09/30/2016      PT End of Session - 09/30/16 1126    Visit Number 10   Number of Visits 18   Date for PT Re-Evaluation 10/26/16   Authorization Type [employer] every 10th visit   PT Start Time 0851   PT Stop Time 0931   PT Time Calculation (min) 40 min   Activity Tolerance Patient tolerated treatment well   Behavior During Therapy [facility] for tasks assessed/performed      Past Medical History:  Diagnosis Date  . Abdominal pain, right lower quadrant   . Abnormality of gait 05/16/2016  . Acute prostatitis   . Anxiety   . Constipation   . Degenerative disc disease   . Depression   . Diverticulosis of colon (without mention of hemorrhage)   . Essential and other specified forms of tremor   . Family history of malignant neoplasm of gastrointestinal tract   . Fibromyalgia   . Ganglion and cyst of synovium, tendon, and bursa   . GERD (gastroesophageal reflux disease)   . Hemarthrosis, upper arm   . Hyperlipidemia   . Hypersomnia with sleep apnea, unspecified   . Hypertension   . Hypopotassemia   . Localized osteoarthrosis not specified whether primary or secondary, lower leg   . Lumbago   . Memory difficulties 04/22/2013  . Nocturia   . Osteoporosis, unspecified   . Other testicular hypofunction   . Parkinson disease ([place]) 05/16/2016  . Right inguinal hernia   . Sleep apnea    last sleep study 11/11 on chart- Bipap with settings of 4 per  patient  . Spinal stenosis, lumbar region, without neurogenic claudication   . Syncope and collapse   . Unspecified adverse effect of unspecified drug, medicinal and biological substance   . Unspecified gastritis and  gastroduodenitis without mention of hemorrhage   . Urinary frequency     Past Surgical History:  Procedure Laterality Date  . BACK SURGERY     x 3  . [place]  . INGUINAL HERNIA REPAIR  07/19/2011   Procedure: LAPAROSCOPIC INGUINAL HERNIA;  Surgeon: Odis Hollingshead, MD;  Location: [facility];  Service: General;  Laterality: Right;  Laparoscopic Repair of Recurrent Right  Ingunial Hernia with Mesh  . microdisectomy  03/02/2003, 06/29/2003, 12/24/2005  . TONSILLECTOMY      There were no vitals filed for this visit.      Subjective Assessment - 09/30/16 0855    Subjective Feeling pretty stiff. Exercises are going pretty good. Doing at least once a day, trying to do twice per day. Walking twice per day (weather permitting) ~4/2 mile. Trying to use cues learned here: heel toe, head up, swing arms.    Pertinent History degenerative disc disease, scoliosis, Parkinson's disease recent diagnosis in past 6 months, neuropathy   Patient Stated Goals Pt's goal for therapy is to help with pain and improve better balance and getting up from chairs.   Currently in Pain? Yes   Pain Score 6    Pain Location Back   Pain Orientation Right;Lower   Pain Descriptors / Indicators Aching   Pain Type Chronic pain   Pain Onset More than a month ago   Pain Frequency Constant  Aggravating Factors  getting off balance aggravates pain    Pain Relieving Factors sitting, resting, heat   Multiple Pain Sites No                         [facility] Adult PT Treatment/Exercise - 09/30/16 1012      Transfers   Transfers Sit to Stand;Stand to Sit   Sit to Stand 6: Modified independent (Device/Increase time);Without upper extremity assist;From chair/3-in-1;With upper extremity assist;From bed   Number of Reps 10 reps   Transfer Cueing cues for full standing between reps, open chest   Comments Discussed and simulated car transfer. Pt has difficulty lifting feet into car due to weakness and  back pain. Educated in leaning back of seat backwards and leaning sideways onto rt UE or shoulder to incr ease of lifting legs. he uses his arms to assist his legs which puts further strain on his low back and educated in seated PWR!! step exercise for strengthening      Neuro Re-ed    Neuro Re-ed Details  Forward and backward weight-shift beside counter wiht LUE support; x 10 each leg in front           PWR [facility]) - 09/30/16 1115    PWR! exercises Moves in sitting;Moves in standing   PWR! Up standing  x 10 reps   PWR! Rock x 5 reps   PWR! Twist x 10   PWR Step x5 reps   Comments Used as a "warm-up" and check on technique; continues to confuse PWR!! rock and step with pictures and verbal cues and demonstration used to improve understanding   PWR! Step x 10 reps   Comments max cues throughout (with visual  demonstration) for technique; educated re: strengthening for car transfers             PT Education - 09/30/16 1124    Education provided Yes   Education Details Educated on seated PWR!! step with handout; forward/backward has been part of HEP, however he requested a handout; car transfer education (see transfers); provided education re: [place] and local Parkinson's exercise classess   Person(s) Educated Patient   Methods Explanation;Demonstration;Tactile cues;Verbal cues;Handout   Comprehension Verbalized understanding;Returned demonstration;Need further instruction          PT Short Term Goals - 09/30/16 1134      PT SHORT TERM GOAL #1   Title Pt will be independent with HEP for improved transfers, balance and mobility.  TARGET 09/28/15   Time 5   Period Weeks   Status Achieved     PT SHORT TERM GOAL #2   Title Pt will improve 5x sit<>stand to less than or equal to 20 seconds for decreased fall risk.   Baseline 14.94 sec 09/24/16   Time 5   Period Weeks   Status Achieved     PT SHORT TERM GOAL #3   Title Pt will improve TUG score to  less than or equal to 13.5 seconds for decreased fall risk.   Baseline 14.97 sec at best 09/24/16   Period Weeks   Status Not Met     PT SHORT TERM GOAL #4   Title Pt will verbalize/demonstrate understanding of body mechanics and positioning, for decreased pain with functional mobility.   Time 5   Period Weeks   Status Achieved     PT SHORT TERM GOAL #5   Title Pt will verbalize understanding of local Parkinson's resources.  Baseline 10-23-2022 provided support group information; Jan newsletter with group ex classes   Time 5   Period Weeks   Status On-going           PT Long Term Goals - 09/02/16 1046      PT LONG TERM GOAL #1   Title Pt will verbalize understanding of fall prevention/tips to reduce freezing episodes of gait.  TARGET 10/26/16   Time 9   Period Weeks   Status On-going     PT LONG TERM GOAL #2   Title Pt will improve 5x sit<>stand to less than or equal to 15 seconds for decreased fall risk.    Time 9   Period Weeks   Status On-going     PT LONG TERM GOAL #3   Title Pt will improve TUG cognitive to less than or equal to 15 seconds for decreased fall risk.   Time 9   Period Weeks   Status On-going     PT LONG TERM GOAL #4   Title Pt will report at least 50% improvement in bed mobility.   Time 9   Period Weeks   Status On-going     PT LONG TERM GOAL #5   Title Pt will ambulate at least 1000 ft. indoor and outdoor surfaces, without loss of balance, for improved outdoor and community gait..   Time 9   Period Weeks   Status On-going     PT LONG TERM GOAL #6   Title Pt will improve Functional Gait Assessment to at least 20/56 for decreased fall risk.   Time 9   Period Weeks   Status On-going     PT LONG TERM GOAL #7   Title Pt will verbalize plans for continued community fitness upon D/C from PT.   Time 9   Period Weeks   Status On-going               Plan - 2016-10-23 1127    Clinical Impression Statement Patient reporting good consistency  with HEP and walking program, however continues to feel very "stiff" and "slow" with movement. Upon reviewing his HEP exercises with patient, he reverts to small movements and encouraged to widen stance, spread fingers, lift chest., etc. to increase intensity of exercises. Educated on local Parkinson's resources and provided written material.    Rehab Potential Good   PT Frequency 2x / week   PT Duration 8 weeks   PT Treatment/Interventions ADLs/Self Care Home Management;Functional mobility training;Gait training;Therapeutic activities;Therapeutic exercise;Balance training;Neuromuscular re-education;Patient/family education;Manual techniques;Electrical Stimulation   PT Next Visit Plan f/u on car transfer technique from 2022/10/23, bed mobility, dynamic gait and balance activities (large intensity movement patterns for improved ease of movement)   PT Home Exercise Plan back exercises from packet and 3 PWR! moves; 09/12/16-added walking program, sit<>stand from elevated surfaces, standing stagger stance forward/back weightshifting   Consulted and Agree with Plan of Care Patient      Patient will benefit from skilled therapeutic intervention in order to improve the following deficits and impairments:  Abnormal gait, Decreased balance, Decreased cognition, Decreased mobility, Decreased strength, Difficulty walking, Postural dysfunction, Pain  Visit Diagnosis: Other abnormalities of gait and mobility  Abnormal posture  Unsteadiness on feet  Other symptoms and signs involving the nervous system       G-Codes - 2016-10-23 1136    Functional Assessment Tool Used TUG 14.97 sec (compared to 17.06 on 1/9 eval); 5 times sit to stand 14.94 sec (compared to 26.09 sec on  1/9 eval)   Functional Limitation Mobility: Walking and moving around   Mobility: Walking and Moving Around Current Status [phone]) At least 20 percent but less than 40 percent impaired, limited or restricted   Mobility: Walking and Moving  Around Goal Status [phone]) At least 1 percent but less than 20 percent impaired, limited or restricted      Problem List Patient Active Problem List   Diagnosis Date Noted  . Parkinson disease ([place]) 05/16/2016  . Abnormality of gait 05/16/2016  . Knee pain 11/01/2013  . Traumatic bursitis 11/01/2013  . Hx of fall 11/01/2013  . Memory difficulties 04/22/2013  . Hemorrhoids 02/24/2013  . PRIMARY CENTRAL SLEEP APNEA 05/21/2010  . NOCTURIA 05/21/2010  . HYPERSOMNIA, ASSOCIATED WITH SLEEP APNEA 03/26/2010  . ABDOMINAL PAIN RIGHT LOWER QUADRANT 03/20/2010  . TREMOR, ESSENTIAL 11/22/2009  . SPINAL STENOSIS, LUMBAR 06/07/2009  . OSTEOARTHRITIS, LOWER LEG, LEFT 05/16/2009  . MYOFASCIAL PAIN SYNDROME 05/16/2009  . URINARY FREQUENCY, CHRONIC 02/09/2009  . DISUSE OSTEOPOROSIS 09/07/2008  . HYPOGONADISM, MALE 07/01/2008  . HYPOKALEMIA 04/19/2008  . SYNCOPE 04/19/2008  . UNS ADVRS EFF UNS RX MEDICINAL&BIOLOGICAL SBSTNC 02/26/2008  . CONSTIPATION, CHRONIC 11/23/2007  . HYPERLIPIDEMIA 05/15/2007  . Right-sided low back pain without sciatica 05/15/2007  . Osteoporosis 05/15/2007  . DIVERTICULOSIS, COLON 02/23/2007  . Essential hypertension 02/12/2007  . GERD 02/12/2007  . GASTRITIS 02/12/2007   Physical Therapy Progress Note  Dates of Reporting Period: 08/27/16 to 09/30/16  Objective Reports of Subjective Statement: see above  Objective Measurements: see above (TUG, five times sit to stand)  Goal Update: see above (met 3 of 5 STG's)  Plan: Continue towards LTG's (see above); 2x/wk x 4 weeks  Reason Skilled Services are Required: Continue to work on balance and gait improvements to reduce fall risk.     Rexanne Mano, PT 09/30/2016, 1:00 PM  [facility] [address] [place], [place], [address] Phone: [phone]   Fax:  [phone]  Name: [name]. MRN: [id number] Date of Birth: Jan 10, 1946

## 2016-09-30 NOTE — Patient Instructions (Signed)
FUNCTIONAL MOBILITY: Weight Shift    Stance: shoulder-width on floor. Stand with upright posture shift weight from side to side. Hold __2_ seconds each side. __203_ reps per set, __2_ sets per day, _5__ days per week  Copyright  VHI. All rights reserved.           Forward Weight Shift    Stand with upright posture. Left leg in front of right. Shift weight forward onto left leg, straighten hip and knee, right heel (back leg) should lift off the floor. Shift weight back onto right leg, and lift front toes.  __10_ reps per set, _2__ sets per day, __5_ days per week. Hold onto a support (chair or countertop). Repeat with other leg.  Copyright  VHI. All rights reserved.

## 2016-10-03 ENCOUNTER — Encounter: Payer: Self-pay | Admitting: Rehabilitation

## 2016-10-03 ENCOUNTER — Ambulatory Visit: Payer: PPO | Admitting: Rehabilitation

## 2016-10-03 DIAGNOSIS — R293 Abnormal posture: Secondary | ICD-10-CM

## 2016-10-03 DIAGNOSIS — R29818 Other symptoms and signs involving the nervous system: Secondary | ICD-10-CM

## 2016-10-03 DIAGNOSIS — R2689 Other abnormalities of gait and mobility: Secondary | ICD-10-CM

## 2016-10-03 DIAGNOSIS — R2681 Unsteadiness on feet: Secondary | ICD-10-CM

## 2016-10-03 NOTE — Therapy (Signed)
[facility] [address] [place], [place], [address] Phone: [phone]   Fax:  [phone]  Physical Therapy Treatment  Patient Details  Name: [name]. MRN: [id number] Date of Birth: 01-30-1946 Referring Provider: Jannifer Franklin  Encounter Date: 10/03/2016      PT End of Session - 10/03/16 0854    Visit Number 11   Number of Visits 18   Date for PT Re-Evaluation 10/26/16   Authorization Type [employer] every 10th visit   PT Start Time 0846   PT Stop Time 0932   PT Time Calculation (min) 46 min   Activity Tolerance Patient tolerated treatment well   Behavior During Therapy [facility] for tasks assessed/performed      Past Medical History:  Diagnosis Date  . Abdominal pain, right lower quadrant   . Abnormality of gait 05/16/2016  . Acute prostatitis   . Anxiety   . Constipation   . Degenerative disc disease   . Depression   . Diverticulosis of colon (without mention of hemorrhage)   . Essential and other specified forms of tremor   . Family history of malignant neoplasm of gastrointestinal tract   . Fibromyalgia   . Ganglion and cyst of synovium, tendon, and bursa   . GERD (gastroesophageal reflux disease)   . Hemarthrosis, upper arm   . Hyperlipidemia   . Hypersomnia with sleep apnea, unspecified   . Hypertension   . Hypopotassemia   . Localized osteoarthrosis not specified whether primary or secondary, lower leg   . Lumbago   . Memory difficulties 04/22/2013  . Nocturia   . Osteoporosis, unspecified   . Other testicular hypofunction   . Parkinson disease ([place]) 05/16/2016  . Right inguinal hernia   . Sleep apnea    last sleep study 11/11 on chart- Bipap with settings of 4 per  patient  . Spinal stenosis, lumbar region, without neurogenic claudication   . Syncope and collapse   . Unspecified adverse effect of unspecified drug, medicinal and biological substance   . Unspecified gastritis and  gastroduodenitis without mention of hemorrhage   . Urinary frequency     Past Surgical History:  Procedure Laterality Date  . BACK SURGERY     x 3  . [place]  . INGUINAL HERNIA REPAIR  07/19/2011   Procedure: LAPAROSCOPIC INGUINAL HERNIA;  Surgeon: Odis Hollingshead, MD;  Location: [facility];  Service: General;  Laterality: Right;  Laparoscopic Repair of Recurrent Right  Ingunial Hernia with Mesh  . microdisectomy  03/02/2003, 06/29/2003, 12/24/2005  . TONSILLECTOMY      There were no vitals filed for this visit.      Subjective Assessment - 10/03/16 0852    Subjective "I feel about the same."    Pertinent History degenerative disc disease, scoliosis, Parkinson's disease recent diagnosis in past 6 months, neuropathy   Patient Stated Goals Pt's goal for therapy is to help with pain and improve better balance and getting up from chairs.   Currently in Pain? Yes   Pain Score 6    Pain Location Back   Pain Orientation Right;Lower   Pain Descriptors / Indicators Aching   Pain Type Chronic pain   Pain Onset More than a month ago   Pain Frequency Constant   Aggravating Factors  getting off balance aggravates pain   Pain Relieving Factors sitting, resting, heat  [place] [facility] Adult PT Treatment/Exercise - 10/03/16 2035      Neuro Re-ed    Neuro Re-ed Details  Seated on physioball maintaining balance x 30 secs progressing to performing seated marching x 10 reps total with increased difficulty on LLE.  Then had pt perform lateral and forward/backwards weight shift while on ball for improved trunk dissociation.  Tolerated well with cues for continued upright posture.  Step ups to 6" step forwards then out to the side with outward lateral arm movement x 10 reps each side with tactile cues for posture.  Corner balance activities with emphasis on narrowing BOS for increased balance challenge with feet in tandem x 15 secs, however note that this was  too difficult so progressed to a semi tandem position each way x 2 reps of 30 secs.  Feet together EC x 30 secs, Feet together EC with head turns side to side x 10 reps, and up/down x 10 reps.  In // bars had pt maintain RLE in stance on foam beam with forward/retro stepping opposite leg (emphasis on large steps) x 10 reps each LE with light UE support.  Lateral stepping over two targets x 10 reps each direction again with emphasis on large steps/large amplitude).  Ended session with elliptical x 2 mins (no resistance) in order to continue to address large fluid movements to carryover to gait.  tolerated well.                  PT Education - 10/03/16 0854    Education provided Yes   Education Details Continued work with [employer]! exercises   Person(s) Educated Patient   Methods Explanation   Comprehension Verbalized understanding          PT Short Term Goals - 09/30/16 1134      PT SHORT TERM GOAL #1   Title Pt will be independent with HEP for improved transfers, balance and mobility.  TARGET 09/28/15   Time 5   Period Weeks   Status Achieved     PT SHORT TERM GOAL #2   Title Pt will improve 5x sit<>stand to less than or equal to 20 seconds for decreased fall risk.   Baseline 14.94 sec 09/24/16   Time 5   Period Weeks   Status Achieved     PT SHORT TERM GOAL #3   Title Pt will improve TUG score to less than or equal to 13.5 seconds for decreased fall risk.   Baseline 14.97 sec at best 09/24/16   Period Weeks   Status Not Met     PT SHORT TERM GOAL #4   Title Pt will verbalize/demonstrate understanding of body mechanics and positioning, for decreased pain with functional mobility.   Time 5   Period Weeks   Status Achieved     PT SHORT TERM GOAL #5   Title Pt will verbalize understanding of local Parkinson's resources.   Baseline 2/12 provided support group information; Jan newsletter with group ex classes   Time 5   Period Weeks   Status On-going           PT Long  Term Goals - 09/02/16 1046      PT LONG TERM GOAL #1   Title Pt will verbalize understanding of fall prevention/tips to reduce freezing episodes of gait.  TARGET 10/26/16   Time 9   Period Weeks   Status On-going     PT LONG TERM GOAL #2   Title Pt will improve 5x sit<>stand to  less than or equal to 15 seconds for decreased fall risk.    Time 9   Period Weeks   Status On-going     PT LONG TERM GOAL #3   Title Pt will improve TUG cognitive to less than or equal to 15 seconds for decreased fall risk.   Time 9   Period Weeks   Status On-going     PT LONG TERM GOAL #4   Title Pt will report at least 50% improvement in bed mobility.   Time 9   Period Weeks   Status On-going     PT LONG TERM GOAL #5   Title Pt will ambulate at least 1000 ft. indoor and outdoor surfaces, without loss of balance, for improved outdoor and community gait..   Time 9   Period Weeks   Status On-going     PT LONG TERM GOAL #6   Title Pt will improve Functional Gait Assessment to at least 20/56 for decreased fall risk.   Time 9   Period Weeks   Status On-going     PT LONG TERM GOAL #7   Title Pt will verbalize plans for continued community fitness upon D/C from PT.   Time 9   Period Weeks   Status On-going               Plan - 10/03/16 1039    Clinical Impression Statement Skilled session focused on postural control with core activation and pelvic dissociation, and high level dynamic balance.  Tolerated all well, however continues to be limited by back pain and focuses on back pain.     Rehab Potential Good   PT Frequency 2x / week   PT Duration 8 weeks   PT Treatment/Interventions ADLs/Self Care Home Management;Functional mobility training;Gait training;Therapeutic activities;Therapeutic exercise;Balance training;Neuromuscular re-education;Patient/family education;Manual techniques;Electrical Stimulation   PT Next Visit Plan [facility] I think he needs to scheudule some more visits, I wasn't  sure if yall had talked about ending early or not) f/u on car transfer technique from 2/12, bed mobility, dynamic gait and balance activities (large intensity movement patterns for improved ease of movement)   PT Home Exercise Plan back exercises from packet and 3 PWR! moves; 09/12/16-added walking program, sit<>stand from elevated surfaces, standing stagger stance forward/back weightshifting   Consulted and Agree with Plan of Care Patient      Patient will benefit from skilled therapeutic intervention in order to improve the following deficits and impairments:  Abnormal gait, Decreased balance, Decreased cognition, Decreased mobility, Decreased strength, Difficulty walking, Postural dysfunction, Pain  Visit Diagnosis: Other abnormalities of gait and mobility  Abnormal posture  Unsteadiness on feet  Other symptoms and signs involving the nervous system     Problem List Patient Active Problem List   Diagnosis Date Noted  . Parkinson disease ([place]) 05/16/2016  . Abnormality of gait 05/16/2016  . Knee pain 11/01/2013  . Traumatic bursitis 11/01/2013  . Hx of fall 11/01/2013  . Memory difficulties 04/22/2013  . Hemorrhoids 02/24/2013  . PRIMARY CENTRAL SLEEP APNEA 05/21/2010  . NOCTURIA 05/21/2010  . HYPERSOMNIA, ASSOCIATED WITH SLEEP APNEA 03/26/2010  . ABDOMINAL PAIN RIGHT LOWER QUADRANT 03/20/2010  . TREMOR, ESSENTIAL 11/22/2009  . SPINAL STENOSIS, LUMBAR 06/07/2009  . OSTEOARTHRITIS, LOWER LEG, LEFT 05/16/2009  . MYOFASCIAL PAIN SYNDROME 05/16/2009  . URINARY FREQUENCY, CHRONIC 02/09/2009  . DISUSE OSTEOPOROSIS 09/07/2008  . HYPOGONADISM, MALE 07/01/2008  . HYPOKALEMIA 04/19/2008  . SYNCOPE 04/19/2008  . UNS ADVRS EFF UNS RX MEDICINAL&BIOLOGICAL SBSTNC  02/26/2008  . CONSTIPATION, CHRONIC 11/23/2007  . HYPERLIPIDEMIA 05/15/2007  . Right-sided low back pain without sciatica 05/15/2007  . Osteoporosis 05/15/2007  . DIVERTICULOSIS, COLON 02/23/2007  . Essential hypertension  02/12/2007  . GERD 02/12/2007  . GASTRITIS 02/12/2007    Cameron Sprang, PT, MPT [facility] [address] [place], [place], [address] Phone: [phone]   Fax:  [phone] 10/03/16, 10:42 AM  Name: Jamaurie Bernier. MRN: [id number] Date of Birth: [birth year]

## 2016-10-07 ENCOUNTER — Encounter: Payer: Self-pay | Admitting: Physical Therapy

## 2016-10-07 ENCOUNTER — Ambulatory Visit: Payer: PPO | Admitting: Physical Therapy

## 2016-10-07 DIAGNOSIS — R293 Abnormal posture: Secondary | ICD-10-CM | POA: Diagnosis not present

## 2016-10-07 DIAGNOSIS — R2689 Other abnormalities of gait and mobility: Secondary | ICD-10-CM

## 2016-10-07 DIAGNOSIS — R2681 Unsteadiness on feet: Secondary | ICD-10-CM

## 2016-10-07 NOTE — Therapy (Signed)
[facility] [address] [place], [place], [address] Phone: [phone]   Fax:  [phone]  Physical Therapy Treatment  Patient Details  Name: [name]. MRN: [id number] Date of Birth: [birth year] Referring Provider: Jannifer Franklin  Encounter Date: 10/07/2016      PT End of Session - 10/07/16 1711    Visit Number 12   Number of Visits 18   Date for PT Re-Evaluation 10/26/16   Authorization Type [employer] every 10th visit   PT Start Time 0850   PT Stop Time 0932   PT Time Calculation (min) 42 min   Activity Tolerance Patient tolerated treatment well   Behavior During Therapy [facility] for tasks assessed/performed      Past Medical History:  Diagnosis Date  . Abdominal pain, right lower quadrant   . Abnormality of gait 05/16/2016  . Acute prostatitis   . Anxiety   . Constipation   . Degenerative disc disease   . Depression   . Diverticulosis of colon (without mention of hemorrhage)   . Essential and other specified forms of tremor   . Family history of malignant neoplasm of gastrointestinal tract   . Fibromyalgia   . Ganglion and cyst of synovium, tendon, and bursa   . GERD (gastroesophageal reflux disease)   . Hemarthrosis, upper arm   . Hyperlipidemia   . Hypersomnia with sleep apnea, unspecified   . Hypertension   . Hypopotassemia   . Localized osteoarthrosis not specified whether primary or secondary, lower leg   . Lumbago   . Memory difficulties 04/22/2013  . Nocturia   . Osteoporosis, unspecified   . Other testicular hypofunction   . Parkinson disease ([place]) 05/16/2016  . Right inguinal hernia   . Sleep apnea    last sleep study 11/11 on chart- Bipap with settings of 4 per  patient  . Spinal stenosis, lumbar region, without neurogenic claudication   . Syncope and collapse   . Unspecified adverse effect of unspecified drug, medicinal and biological substance   . Unspecified gastritis and  gastroduodenitis without mention of hemorrhage   . Urinary frequency     Past Surgical History:  Procedure Laterality Date  . BACK SURGERY     x 3  . [place]  . INGUINAL HERNIA REPAIR  07/19/2011   Procedure: LAPAROSCOPIC INGUINAL HERNIA;  Surgeon: Odis Hollingshead, MD;  Location: [facility];  Service: General;  Laterality: Right;  Laparoscopic Repair of Recurrent Right  Ingunial Hernia with Mesh  . microdisectomy  03/02/2003, 06/29/2003, 12/24/2005  . TONSILLECTOMY      There were no vitals filed for this visit.      Subjective Assessment - 10/07/16 0851    Subjective I could do better with my exercises. Sometimes they aggravate my back pain (pelvic tilts, bridging). Car transfers have gotten some better. Depends on the car (SUV's are easier). Reports bed mobility has not improved. He does his supine PWR twist exercise on the floor due to it's too difficult for him to roll on his bed.    Pertinent History degenerative disc disease, scoliosis, Parkinson's disease recent diagnosis in past 6 months, neuropathy   Patient Stated Goals Pt's goal for therapy is to help with pain and improve better balance and getting up from chairs.   Currently in Pain? Yes   Pain Score 6    Pain Location Back   Pain Orientation Right;Lower   Pain Descriptors / Indicators Aching   Pain  Type Chronic pain   Pain Radiating Towards Rt hip   Pain Onset More than a month ago   Pain Frequency Constant   Aggravating Factors  getting off balance aggravates Pain   Pain Relieving Factors sittin, resting, heat                         [facility] Adult PT Treatment/Exercise - 10/07/16 1656      Bed Mobility   Bed Mobility Rolling Right;Rolling Left;Right Sidelying to Sit;Sit to Sidelying Right   Rolling Right 6: Modified independent (Device/Increase time)   Rolling Left 6: Modified independent (Device/Increase time)   Right Sidelying to Sit 5: Supervision   Right Sidelying to Sit  Details (indicate cue type and reason) vc to fully roll onto rt prior to taking legs off the bed   Sit to Sidelying Right 6: Modified independent (Device/Increase time)  Patient reports it is far easier to roll on our firm mat table than his soft/Tempurpedic mattress at home.      Transfers   Transfers Sit to Stand;Stand to Sit   Sit to Stand 6: Modified independent (Device/Increase time)   Stand to Sit 6: Modified independent (Device/Increase time)           PWR [facility]) - 10/07/16 1702    PWR! exercises Moves in supine   PWR! Twist x 10 reps each side; used bil knees bent to facilitate rolling as pt continues to have difficulty rolling on his bed at home. 2nd set x 5 each side with 3# weights on wrists for strengthening/momentum to roll             PT Education - 10/07/16 1708    Education provided Yes   Education Details Educated on options for post-OPPT continued exercise program. Patient has expressed interest in returning to a yoga class. Patient provided information on [facility], including location, [place] classes offered (chair yoga, chair exercise, tai chi beginners). Also provided with [facility] [facility] where there is an Art therapist who is also a licensed PTA that apparently does a great job modifying classes for those with special needs. Educated in bed mobility techniques and performing his supine exercises on his bed (not the floor) to incr practice in bed mobility.   Person(s) Educated Patient   Methods Explanation;Handout   Comprehension Verbalized understanding          PT Short Term Goals - 09/30/16 1134      PT SHORT TERM GOAL #1   Title Pt will be independent with HEP for improved transfers, balance and mobility.  TARGET 09/28/15   Time 5   Period Weeks   Status Achieved     PT SHORT TERM GOAL #2   Title Pt will improve 5x sit<>stand to less than or equal to 20 seconds for decreased fall risk.   Baseline 14.94 sec 09/24/16   Time 5   Period Weeks    Status Achieved     PT SHORT TERM GOAL #3   Title Pt will improve TUG score to less than or equal to 13.5 seconds for decreased fall risk.   Baseline 14.97 sec at best 09/24/16   Period Weeks   Status Not Met     PT SHORT TERM GOAL #4   Title Pt will verbalize/demonstrate understanding of body mechanics and positioning, for decreased pain with functional mobility.   Time 5   Period Weeks   Status Achieved     PT  SHORT TERM GOAL #5   Title Pt will verbalize understanding of local Parkinson's resources.   Baseline 2/12 provided support group information; Jan newsletter with group ex classes   Time 5   Period Weeks   Status On-going           PT Long Term Goals - 09/02/16 1046      PT LONG TERM GOAL #1   Title Pt will verbalize understanding of fall prevention/tips to reduce freezing episodes of gait.  TARGET 10/26/16   Time 9   Period Weeks   Status On-going     PT LONG TERM GOAL #2   Title Pt will improve 5x sit<>stand to less than or equal to 15 seconds for decreased fall risk.    Time 9   Period Weeks   Status On-going     PT LONG TERM GOAL #3   Title Pt will improve TUG cognitive to less than or equal to 15 seconds for decreased fall risk.   Time 9   Period Weeks   Status On-going     PT LONG TERM GOAL #4   Title Pt will report at least 50% improvement in bed mobility.   Time 9   Period Weeks   Status On-going     PT LONG TERM GOAL #5   Title Pt will ambulate at least 1000 ft. indoor and outdoor surfaces, without loss of balance, for improved outdoor and community gait..   Time 9   Period Weeks   Status On-going     PT LONG TERM GOAL #6   Title Pt will improve Functional Gait Assessment to at least 20/56 for decreased fall risk.   Time 9   Period Weeks   Status On-going     PT LONG TERM GOAL #7   Title Pt will verbalize plans for continued community fitness upon D/C from PT.   Time 9   Period Weeks   Status On-going               Plan -  10/07/16 1712    Clinical Impression Statement Session initially focused on education re: community exercise opportunities appropriate for patient when he completes his course of OPPT. We discussed his progress and he feels he will have a well-established HEP and plans to make 10/17/16 his last PT visit. Patient continues to report difficulty with bed mobility on his bed at home and altered supine PWR twist exercise to assist with strengthening his ability to roll over on his softer mattress. Educated re: option of obtaining a new, more firm mattress which he has considered.    Rehab Potential Good   PT Frequency 2x / week   PT Duration 8 weeks   PT Treatment/Interventions ADLs/Self Care Home Management;Functional mobility training;Gait training;Therapeutic activities;Therapeutic exercise;Balance training;Neuromuscular re-education;Patient/family education;Manual techniques;Electrical Stimulation   PT Next Visit Plan Gait (including outdoors x 151f); bed mobility, dynamic gait and balance activities (large intensity movement patterns for improved ease of movement); check knowledge re: freezing tips, local Parkinson's resources (has previously been given info)   PT Home Exercise Plan back exercises from packet and 3 PWR! moves (sitting PWR up, supine twist, standing rock); 09/12/16-added walking program, sit<>stand from elevated surfaces, standing stagger stance forward/back weightshifting   Consulted and Agree with Plan of Care Patient      Patient will benefit from skilled therapeutic intervention in order to improve the following deficits and impairments:  Abnormal gait, Decreased balance, Decreased cognition, Decreased mobility, Decreased strength, Difficulty  walking, Postural dysfunction, Pain  Visit Diagnosis: Other abnormalities of gait and mobility  Abnormal posture  Unsteadiness on feet     Problem List Patient Active Problem List   Diagnosis Date Noted  . Parkinson disease ([place])  05/16/2016  . Abnormality of gait 05/16/2016  . Knee pain 11/01/2013  . Traumatic bursitis 11/01/2013  . Hx of fall 11/01/2013  . Memory difficulties 04/22/2013  . Hemorrhoids 02/24/2013  . PRIMARY CENTRAL SLEEP APNEA 05/21/2010  . NOCTURIA 05/21/2010  . HYPERSOMNIA, ASSOCIATED WITH SLEEP APNEA 03/26/2010  . ABDOMINAL PAIN RIGHT LOWER QUADRANT 03/20/2010  . TREMOR, ESSENTIAL 11/22/2009  . SPINAL STENOSIS, LUMBAR 06/07/2009  . OSTEOARTHRITIS, LOWER LEG, LEFT 05/16/2009  . MYOFASCIAL PAIN SYNDROME 05/16/2009  . URINARY FREQUENCY, CHRONIC 02/09/2009  . DISUSE OSTEOPOROSIS 09/07/2008  . HYPOGONADISM, MALE 07/01/2008  . HYPOKALEMIA 04/19/2008  . SYNCOPE 04/19/2008  . UNS ADVRS EFF UNS RX MEDICINAL&BIOLOGICAL SBSTNC 02/26/2008  . CONSTIPATION, CHRONIC 11/23/2007  . HYPERLIPIDEMIA 05/15/2007  . Right-sided low back pain without sciatica 05/15/2007  . Osteoporosis 05/15/2007  . DIVERTICULOSIS, COLON 02/23/2007  . Essential hypertension 02/12/2007  . GERD 02/12/2007  . GASTRITIS 02/12/2007    Rexanne Mano, PT 10/07/2016, 5:23 PM  [facility] [address] [place], [place], [address] Phone: [phone]   Fax:  [phone]  Name: Nettie Cromwell. MRN: [id number] Date of Birth: 1945/12/08

## 2016-10-07 NOTE — Patient Instructions (Addendum)
     PWR! Twist in supine on your bed, using 3 lb weights (around wrists) with knees bent to increase ease of rolling. X 10 to each side (or count to 20).  **modified to facilitate rolling on his tempurpedic mattress where he "sinks down into a hole"

## 2016-10-10 ENCOUNTER — Encounter: Payer: Self-pay | Admitting: Physical Therapy

## 2016-10-10 ENCOUNTER — Ambulatory Visit: Payer: PPO | Admitting: Physical Therapy

## 2016-10-10 DIAGNOSIS — R29818 Other symptoms and signs involving the nervous system: Secondary | ICD-10-CM

## 2016-10-10 DIAGNOSIS — R2681 Unsteadiness on feet: Secondary | ICD-10-CM

## 2016-10-10 DIAGNOSIS — R2689 Other abnormalities of gait and mobility: Secondary | ICD-10-CM

## 2016-10-10 DIAGNOSIS — R293 Abnormal posture: Secondary | ICD-10-CM | POA: Diagnosis not present

## 2016-10-10 NOTE — Therapy (Signed)
[facility] [address] [place], [place], [address] Phone: [phone]   Fax:  [phone]  Physical Therapy Treatment and Discharge Summary  Patient Details  Name: [name]. MRN: [id number] Date of Birth: Oct 15, 1945 Referring Provider: Jannifer Franklin  Encounter Date: 10/10/2016      PT End of Session - 10/10/16 2042    Visit Number 13   Number of Visits 18   Date for PT Re-Evaluation 10/26/16   Authorization Type [employer] every 10th visit   PT Start Time 0935   PT Stop Time 1020   PT Time Calculation (min) 45 min   Activity Tolerance Patient tolerated treatment well   Behavior During Therapy [facility] for tasks assessed/performed      Past Medical History:  Diagnosis Date  . Abdominal pain, right lower quadrant   . Abnormality of gait 05/16/2016  . Acute prostatitis   . Anxiety   . Constipation   . Degenerative disc disease   . Depression   . Diverticulosis of colon (without mention of hemorrhage)   . Essential and other specified forms of tremor   . Family history of malignant neoplasm of gastrointestinal tract   . Fibromyalgia   . Ganglion and cyst of synovium, tendon, and bursa   . GERD (gastroesophageal reflux disease)   . Hemarthrosis, upper arm   . Hyperlipidemia   . Hypersomnia with sleep apnea, unspecified   . Hypertension   . Hypopotassemia   . Localized osteoarthrosis not specified whether primary or secondary, lower leg   . Lumbago   . Memory difficulties 04/22/2013  . Nocturia   . Osteoporosis, unspecified   . Other testicular hypofunction   . Parkinson disease ([place]) 05/16/2016  . Right inguinal hernia   . Sleep apnea    last sleep study 11/11 on chart- Bipap with settings of 4 per  patient  . Spinal stenosis, lumbar region, without neurogenic claudication   . Syncope and collapse   . Unspecified adverse effect of unspecified drug, medicinal and biological substance   . Unspecified  gastritis and gastroduodenitis without mention of hemorrhage   . Urinary frequency     Past Surgical History:  Procedure Laterality Date  . BACK SURGERY     x 3  . [place]  . INGUINAL HERNIA REPAIR  07/19/2011   Procedure: LAPAROSCOPIC INGUINAL HERNIA;  Surgeon: Odis Hollingshead, MD;  Location: [facility];  Service: General;  Laterality: Right;  Laparoscopic Repair of Recurrent Right  Ingunial Hernia with Mesh  . microdisectomy  03/02/2003, 06/29/2003, 12/24/2005  . TONSILLECTOMY      There were no vitals filed for this visit.      Subjective Assessment - 10/10/16 0942    Subjective Has not been doing his walking due to weather and recently increased his back pain. Tried cutting back on distance, however little relief so he has just not done any recently. Has not checked out [place] exercise classes, but plans to.   Pertinent History degenerative disc disease, scoliosis, Parkinson's disease recent diagnosis in past 6 months, neuropathy   Patient Stated Goals Pt's goal for therapy is to help with pain and improve better balance and getting up from chairs.   Currently in Pain? Yes   Pain Score 6    Pain Location Back   Pain Orientation Right;Lower   Pain Descriptors / Indicators Aching   Pain Type Chronic pain   Pain Onset More than a month ago  Pain Frequency Constant   Multiple Pain Sites No            [facility] PT Assessment - 10/10/16 2030      Functional Gait  Assessment   Gait assessed  Yes   Gait Level Surface Walks 20 ft in less than 7 sec but greater than 5.5 sec, uses assistive device, slower speed, mild gait deviations, or deviates 6-10 in outside of the 12 in walkway width.   Change in Gait Speed Makes only minor adjustments to walking speed, or accomplishes a change in speed with significant gait deviations, deviates 10-15 in outside the 12 in walkway width, or changes speed but loses balance but is able to recover and continue walking.   Gait  with Horizontal Head Turns Performs head turns with moderate changes in gait velocity, slows down, deviates 10-15 in outside 12 in walkway width but recovers, can continue to walk.   Gait with Vertical Head Turns Performs task with slight change in gait velocity (eg, minor disruption to smooth gait path), deviates 6 - 10 in outside 12 in walkway width or uses assistive device   Gait and Pivot Turn Pivot turns safely in greater than 3 sec and stops with no loss of balance, or pivot turns safely within 3 sec and stops with mild imbalance, requires small steps to catch balance.   Step Over Obstacle Is able to step over one shoe box (4.5 in total height) but must slow down and adjust steps to clear box safely. May require verbal cueing.   Gait with Narrow Base of Support Ambulates 4-7 steps.   Gait with Eyes Closed Walks 20 ft, uses assistive device, slower speed, mild gait deviations, deviates 6-10 in outside 12 in walkway width. Ambulates 20 ft in less than 9 sec but greater than 7 sec.   Ambulating Backwards Walks 20 ft, uses assistive device, slower speed, mild gait deviations, deviates 6-10 in outside 12 in walkway width.   Steps Alternating feet, must use rail.   Total Score 16                     [facility] Adult PT Treatment/Exercise - 10/10/16 2030      Bed Mobility   Bed Mobility --  reports marginally improved (~10%); feels b/c soft mattress     Transfers   Transfers Sit to Stand;Stand to Sit   Sit to Stand 6: Modified independent (Device/Increase time);Without upper extremity assist;From chair/3-in-1   Five time sit to stand comments  17.69   Stand to Sit 6: Modified independent (Device/Increase time);Without upper extremity assist;To chair/3-in-1   Number of Reps 10 reps     Ambulation/Gait   Ambulation/Gait Yes   Ambulation/Gait Assistance 6: Modified independent (Device/Increase time)   Ambulation Distance (Feet) 1000 Feet   Assistive device None   Gait Pattern  Step-through pattern;Decreased arm swing - right;Decreased step length - right;Decreased trunk rotation;Trunk flexed   Ambulation Surface Level;Unlevel;Indoor;Outdoor;Paved     Posture/Postural Control   Posture/Postural Control Postural limitations   Postural Limitations Rounded Shoulders;Forward head;Posterior pelvic tilt;Decreased lumbar lordosis   Posture Comments Pt able to partially reverse limitations with cues, however does not maintain     Timed Up and Go Test   Normal TUG (seconds) 15.1           PWR [facility]) - 10/10/16 2034    PWR! exercises Moves in standing   PWR! Rock x10   PWR! Twist x20   PWR Step x20  Comments reviewed with handout the difference between step and twist             PT Education - 10/10/16 2041    Education provided Yes   Education Details Continuing activity post PT discharge (Parkinson's exercise classes vs [place] vs walking program).   Person(s) Educated Patient   Methods Explanation   Comprehension Verbalized understanding          PT Short Term Goals - 10/10/16 2046      PT SHORT TERM GOAL #1   Title Pt will be independent with HEP for improved transfers, balance and mobility.  TARGET 09/28/15   Time 5   Period Weeks   Status Achieved     PT SHORT TERM GOAL #2   Title Pt will improve 5x sit<>stand to less than or equal to 20 seconds for decreased fall risk.   Baseline 14.94 sec 09/24/16   Time 5   Period Weeks   Status Achieved     PT SHORT TERM GOAL #3   Title Pt will improve TUG score to less than or equal to 13.5 seconds for decreased fall risk.   Baseline 14.97 sec at best 09/24/16   Period Weeks   Status Not Met     PT SHORT TERM GOAL #4   Title Pt will verbalize/demonstrate understanding of body mechanics and positioning, for decreased pain with functional mobility.   Time 5   Period Weeks   Status Achieved     PT SHORT TERM GOAL #5   Title Pt will verbalize understanding of local Parkinson's resources.    Baseline 2/12 provided support group information; Jan newsletter with group ex classes   Time 5   Period Weeks   Status Achieved           PT Long Term Goals - 10/10/16 2046      PT LONG TERM GOAL #1   Title Pt will verbalize understanding of fall prevention/tips to reduce freezing episodes of gait.  TARGET 10/26/16   Time 9   Period Weeks   Status Achieved     PT LONG TERM GOAL #2   Title Pt will improve 5x sit<>stand to less than or equal to 15 seconds for decreased fall risk.    Baseline 10/10/16 17.69 seconds   Time 9   Period Weeks   Status Not Met     PT LONG TERM GOAL #3   Title Pt will improve TUG cognitive to less than or equal to 15 seconds for decreased fall risk.   Baseline 10/10/16 15.1 sec   Time 9   Period Weeks   Status Partially Met     PT LONG TERM GOAL #4   Title Pt will report at least 50% improvement in bed mobility.   Baseline reports only 10% improvement   Time 9   Period Weeks   Status Not Met     PT LONG TERM GOAL #5   Title Pt will ambulate at least 1000 ft. indoor and outdoor surfaces, without loss of balance, for improved outdoor and community gait..   Time 9   Period Weeks   Status Achieved     PT LONG TERM GOAL #6   Title Pt will improve Functional Gait Assessment to at least 20/56 for decreased fall risk.   Baseline 10/10/16  16/30   Time 9   Period Weeks   Status Not Met     PT LONG TERM GOAL #7   Title Pt  will verbalize plans for continued community fitness upon D/C from PT.   Time 9   Period Weeks   Status Achieved               Plan - 10-12-16 2042    Clinical Impression Statement When discussing patient's personal goals and community exercise options post OPPT, pt reported he wanted today to be his last PT session. Final measurements taken for goal assessment. Patient met 3 of 7 LTGs with discharge after 13 of predicted 18 visits. Patient sounds motivated to continue exercising (he discussed he has "rearranged" his  HEP to group exercises in sitting vs standing vs supine to make it work for him. Patient agreeable to return for Parkinson's screen in 6 months to monitor his status.   Rehab Potential Good   PT Treatment/Interventions ADLs/Self Care Home Management;Functional mobility training;Gait training;Therapeutic activities;Therapeutic exercise;Balance training;Neuromuscular re-education;Patient/family education;Manual techniques;Electrical Stimulation   PT Home Exercise Plan back exercises from packet and 3 PWR! moves (sitting PWR up, supine twist, standing rock); 09/12/16-added walking program, sit<>stand from elevated surfaces, standing stagger stance forward/back weightshifting   Consulted and Agree with Plan of Care Patient      Patient will benefit from skilled therapeutic intervention in order to improve the following deficits and impairments:  Abnormal gait, Decreased balance, Decreased cognition, Decreased mobility, Decreased strength, Difficulty walking, Postural dysfunction, Pain  Visit Diagnosis: Other abnormalities of gait and mobility  Abnormal posture  Unsteadiness on feet  Other symptoms and signs involving the nervous system       G-Codes - 2016-10-12 2050    Functional Assessment Tool Used (Outpatient Only) TUG 15.1 sec (compared to 17.06 on 1/9 eval); 5 times sit to stand 17.69 sec (compared to 26.09 sec on 1/9 eval)   Functional Limitation Mobility: Walking and moving around   Mobility: Walking and Moving Around Goal Status [phone]) At least 1 percent but less than 20 percent impaired, limited or restricted   Mobility: Walking and Moving Around Discharge Status [phone]) At least 1 percent but less than 20 percent impaired, limited or restricted      Problem List Patient Active Problem List   Diagnosis Date Noted  . Parkinson disease ([facility]) 05/16/2016  . Abnormality of gait 05/16/2016  . Knee pain 11/01/2013  . Traumatic bursitis 11/01/2013  . Hx of fall 11/01/2013  . Memory  difficulties 04/22/2013  . Hemorrhoids 02/24/2013  . PRIMARY CENTRAL SLEEP APNEA 05/21/2010  . NOCTURIA 05/21/2010  . HYPERSOMNIA, ASSOCIATED WITH SLEEP APNEA 03/26/2010  . ABDOMINAL PAIN RIGHT LOWER QUADRANT 03/20/2010  . TREMOR, ESSENTIAL 11/22/2009  . SPINAL STENOSIS, LUMBAR 06/07/2009  . OSTEOARTHRITIS, LOWER LEG, LEFT 05/16/2009  . MYOFASCIAL PAIN SYNDROME 05/16/2009  . URINARY FREQUENCY, CHRONIC 02/09/2009  . DISUSE OSTEOPOROSIS 09/07/2008  . HYPOGONADISM, MALE 07/01/2008  . HYPOKALEMIA 04/19/2008  . SYNCOPE 04/19/2008  . UNS ADVRS EFF UNS RX MEDICINAL&BIOLOGICAL SBSTNC 02/26/2008  . CONSTIPATION, CHRONIC 11/23/2007  . HYPERLIPIDEMIA 05/15/2007  . Right-sided low back pain without sciatica 05/15/2007  . Osteoporosis 05/15/2007  . DIVERTICULOSIS, COLON 02/23/2007  . Essential hypertension 02/12/2007  . GERD 02/12/2007  . GASTRITIS 02/12/2007   PHYSICAL THERAPY DISCHARGE SUMMARY  Visits from Start of Care: 08/27/16  Current functional level related to goals / functional outcomes: Patient with improved FGA, TUG, and 5x sit to stand. Remains a fall risk, however was decreased.   Remaining deficits: Standardized test scores improved, but remain at a level indicative of higher fall risk   Education / Equipment: HEP;  theraband  Plan: Patient agrees to discharge.  Patient goals were partially met. Patient is being discharged due to the patient's request.  ?????         [name], PT 10/10/2016, 8:57 PM  [facility] [address] [place], [place], [address] Phone: [phone]   Fax:  [phone]  Name: [name]. MRN: [id number] Date of Birth: 04/12/1946

## 2016-10-14 ENCOUNTER — Ambulatory Visit: Payer: PPO | Admitting: Physical Therapy

## 2016-10-17 ENCOUNTER — Ambulatory Visit: Payer: PPO | Admitting: Physical Therapy

## 2016-10-23 ENCOUNTER — Other Ambulatory Visit: Payer: Self-pay | Admitting: Family Medicine

## 2016-10-31 ENCOUNTER — Telehealth: Payer: Self-pay | Admitting: Neurology

## 2016-10-31 ENCOUNTER — Encounter: Payer: Self-pay | Admitting: Neurology

## 2016-10-31 NOTE — Telephone Encounter (Signed)
I will dictate a letter excusing this gentleman from jury duty.

## 2016-10-31 NOTE — Telephone Encounter (Signed)
Called son back and advised letter ready to be picked up. Asked him to call back if he would like it mailed instead, otherwise up front for pick up. Gave [facility] phone number.

## 2016-10-31 NOTE — Telephone Encounter (Signed)
Patients son [name] called office in reference to patient having jury duty and needing a letter stating patient will not be able to do jury duty due to condition.  Please call

## 2016-11-07 ENCOUNTER — Other Ambulatory Visit: Payer: Self-pay | Admitting: Family Medicine

## 2016-11-07 ENCOUNTER — Other Ambulatory Visit: Payer: Self-pay | Admitting: Neurology

## 2016-11-19 ENCOUNTER — Encounter: Payer: Self-pay | Admitting: Family Medicine

## 2016-11-19 ENCOUNTER — Ambulatory Visit (INDEPENDENT_AMBULATORY_CARE_PROVIDER_SITE_OTHER): Payer: PPO | Admitting: Family Medicine

## 2016-11-19 VITALS — BP 140/78 | HR 58 | Temp 98.4°F | Wt 156.1 lb

## 2016-11-19 DIAGNOSIS — M25552 Pain in left hip: Secondary | ICD-10-CM | POA: Diagnosis not present

## 2016-11-19 DIAGNOSIS — M7062 Trochanteric bursitis, left hip: Secondary | ICD-10-CM

## 2016-11-19 MED ORDER — METHYLPREDNISOLONE ACETATE 80 MG/ML IJ SUSP
80.0000 mg | Freq: Once | INTRAMUSCULAR | Status: AC
  Administered 2016-11-19: 80 mg via INTRAMUSCULAR

## 2016-11-19 NOTE — Progress Notes (Signed)
Pre visit review using our clinic review tool, if applicable. No additional management support is needed unless otherwise documented below in the visit note. 

## 2016-11-19 NOTE — Progress Notes (Signed)
Subjective:     Patient ID: [name] Brooking., male   DOB: 31-Oct-1945, 71 y.o.   MRN: [id number]  HPI Patient seen with about 3-5 week history of progressive left lateral hip pain. No recent injury. He has known history of lumbar stenosis. MRI lumbar spine back in October showed multilevel degenerative back arthritis. He has not had any recent low back pain. Symptoms are worse with walking and location is consistently lateral without radiation. Starting to interfere with sleep. He has used heat without relief. Has not seen any visible swelling. Denies lower extremity numbness. No weakness. Ambulating with walker.  Past Medical History:  Diagnosis Date  . Abdominal pain, right lower quadrant   . Abnormality of gait 05/16/2016  . Acute prostatitis   . Anxiety   . Constipation   . Degenerative disc disease   . Depression   . Diverticulosis of colon (without mention of hemorrhage)   . Essential and other specified forms of tremor   . Family history of malignant neoplasm of gastrointestinal tract   . Fibromyalgia   . Ganglion and cyst of synovium, tendon, and bursa   . GERD (gastroesophageal reflux disease)   . Hemarthrosis, upper arm   . Hyperlipidemia   . Hypersomnia with sleep apnea, unspecified   . Hypertension   . Hypopotassemia   . Localized osteoarthrosis not specified whether primary or secondary, lower leg   . Lumbago   . Memory difficulties 04/22/2013  . Nocturia   . Osteoporosis, unspecified   . Other testicular hypofunction   . Parkinson disease ([place]) 05/16/2016  . Right inguinal hernia   . Sleep apnea    last sleep study 11/11 on chart- Bipap with settings of 4 per  patient  . Spinal stenosis, lumbar region, without neurogenic claudication   . Syncope and collapse   . Unspecified adverse effect of unspecified drug, medicinal and biological substance   . Unspecified gastritis and gastroduodenitis without mention of hemorrhage   . Urinary frequency    Past Surgical History:   Procedure Laterality Date  . BACK SURGERY     x 3  . [place]  . INGUINAL HERNIA REPAIR  07/19/2011   Procedure: LAPAROSCOPIC INGUINAL HERNIA;  Surgeon: Odis Hollingshead, MD;  Location: [facility];  Service: General;  Laterality: Right;  Laparoscopic Repair of Recurrent Right  Ingunial Hernia with Mesh  . microdisectomy  03/02/2003, 06/29/2003, 12/24/2005  . TONSILLECTOMY      reports that he has never smoked. He has never used smokeless tobacco. He reports that he does not drink alcohol or use drugs. family history includes Colon cancer in his mother; Dementia in his mother; Lung cancer in his paternal grandfather; Parkinsonism (age of onset: 59) in his father. Allergies  Allergen Reactions  . Nucynta [Tapentadol] Other (See Comments)    dizziness  . Pregabalin     REACTION: dizziness and mental status changes  . Exelon [Rivastigmine]     abd pain     Review of Systems  Constitutional: Negative for appetite change, chills, fever and unexpected weight change.       Objective:   Physical Exam  Constitutional: He appears well-developed and well-nourished.  Cardiovascular: Normal rate and regular rhythm.   Musculoskeletal:  Left lower extremity no edema. Feet are warm to touch with 2+ dorsalis pedis pulse on the left. He has fairly good range of motion left hip with no pain with internal or external rotation. Straight leg raise negative. He  has localized tenderness over the greater trochanteric bursa on the left.       Assessment:     Several week history of progressive left lateral hip pain. Suspect greater trochanteric bursitis    Plan:     -We discussed risk and benefits of steroid injection including risk of bleeding, bruising, infection and patient consents. Left lateral hip prepped with Betadine. Using 25-gauge one and 1/2 inch needle injected 1 mL Depo-Medrol and 2 mL of Xylocaine -Try some icing for the next couple days -Touch base if pain not greatly  improved over the next week  Eulas Post MD [facility]

## 2016-11-19 NOTE — Patient Instructions (Addendum)
Hip Bursitis Hip bursitis is inflammation of a fluid-filled sac (bursa) in the hip joint. The bursa protects the bones in the hip joint from rubbing against each other. Hip bursitis can cause mild to moderate pain, and symptoms often come and go over time. What are the causes? This condition may be caused by:  Injury to the hip.  Overuse of the muscles that surround the hip joint.  Arthritis or gout.  Diabetes.  Thyroid disease.  Cold weather.  Infection. In some cases, the cause may not be known. What are the signs or symptoms? Symptoms of this condition may include:  Mild or moderate pain in the hip area. Pain may get worse with movement.  Tenderness and swelling of the hip, especially on the outer side of the hip. Symptoms may come and go. If the bursa becomes infected, you may have the following symptoms:  Fever.  Red skin and a feeling of warmth in the hip area. How is this diagnosed? This condition may be diagnosed based on:  A physical exam.  Your medical history.  X-rays.  Removal of fluid from your inflamed bursa for testing (biopsy). You may be sent to a health care provider who specializes in bone diseases (orthopedist) or a provider who specializes in joint inflammation (rheumatologist). How is this treated? This condition is treated by resting, raising (elevating), and applying pressure(compression) to the injured area. In some cases, this may be enough to make your symptoms go away. Treatment may also include:  Crutches.  Antibiotic medicine.  Draining fluid out of the bursa to help relieve swelling.  Injecting medicine that helps to reduce inflammation (cortisone). Follow these instructions at home: Medicines   Take over-the-counter and prescription medicines only as told by your health care provider.  Do not drive or operate heavy machinery while taking prescription pain medicine, or as told by your health care provider.  If you were  prescribed an antibiotic, take it as told by your health care provider. Do not stop taking the antibiotic even if you start to feel better. Activity   Return to your normal activities as told by your health care provider. Ask your health care provider what activities are safe for you.  Rest and protect your hip as much as possible until your pain and swelling get better. General instructions   Wear compression wraps only as told by your health care provider.  Elevate your hip above the level of your heart as much as you can without pain. To do this, try putting a pillow under your hips while you lie down.  Do not use your hip to support your body weight until your health care provider says that you can. Use crutches as told by your health care provider.  Gently massage and stretch your injured area as often as is comfortable.  Keep all follow-up visits as told by your health care provider. This is important. How is this prevented?  Exercise regularly, as told by your health care provider.  Warm up and stretch before being active.  Cool down and stretch after being active.  If an activity irritates your hip or causes pain, avoid the activity as much as possible.  Avoid sitting down for long periods at a time. Contact a health care provider if:  You have a fever.  You develop new symptoms.  You have difficulty walking or doing everyday activities.  You have pain that gets worse or does not get better with medicine.  You develop red  skin or a feeling of warmth in your hip area. Get help right away if:  You cannot move your hip.  You have severe pain. This information is not intended to replace advice given to you by your health care provider. Make sure you discuss any questions you have with your health care provider. Document Released: 01/25/2002 Document Revised: 01/11/2016 Document Reviewed: 03/07/2015 Elsevier Interactive Patient Education  2017 [place]  some icing 15-20 minutes 2-3 times daily for the next couple days -Touch base by early next week if pain not greatly improved -Would consider getting x-rays left hip if not improving over the next week

## 2016-11-21 ENCOUNTER — Telehealth: Payer: Self-pay | Admitting: Family Medicine

## 2016-11-21 DIAGNOSIS — M25552 Pain in left hip: Secondary | ICD-10-CM

## 2016-11-21 NOTE — Telephone Encounter (Signed)
° ° °  Pt was told to call back if he was not having any relief from the injection .  Pt call to say he is not having any relief from the injection he received

## 2016-11-22 ENCOUNTER — Ambulatory Visit (INDEPENDENT_AMBULATORY_CARE_PROVIDER_SITE_OTHER)
Admission: RE | Admit: 2016-11-22 | Discharge: 2016-11-22 | Disposition: A | Discharge status: Home / Self Care | Payer: PPO | Source: Ambulatory Visit | Attending: Family Medicine | Admitting: Family Medicine

## 2016-11-22 DIAGNOSIS — M25552 Pain in left hip: Secondary | ICD-10-CM

## 2016-11-22 NOTE — Telephone Encounter (Signed)
I suspect some of hip pain may be coming from his back.  Would go ahead and get plain film of left hip to further assess.

## 2016-11-22 NOTE — Telephone Encounter (Signed)
I called the pt and informed him of the message below and he is aware to go to the [place] office for his x-rays and our office will call him back with results.

## 2016-11-25 ENCOUNTER — Other Ambulatory Visit: Payer: Self-pay | Admitting: Family Medicine

## 2016-11-25 DIAGNOSIS — M25552 Pain in left hip: Secondary | ICD-10-CM

## 2016-12-04 ENCOUNTER — Encounter (INDEPENDENT_AMBULATORY_CARE_PROVIDER_SITE_OTHER): Payer: Self-pay | Admitting: Orthopaedic Surgery

## 2016-12-04 ENCOUNTER — Ambulatory Visit (INDEPENDENT_AMBULATORY_CARE_PROVIDER_SITE_OTHER): Payer: PPO | Admitting: Orthopaedic Surgery

## 2016-12-04 DIAGNOSIS — M7062 Trochanteric bursitis, left hip: Secondary | ICD-10-CM | POA: Insufficient documentation

## 2016-12-04 MED ORDER — DICLOFENAC SODIUM 1 % TD GEL: 2.0000 g | Freq: Four times a day (QID) | TRANSDERMAL | 3 refills | Status: DC

## 2016-12-04 NOTE — Progress Notes (Signed)
Office Visit Note   Patient: [name].           Date of Birth: 1945/10/11           MRN: [id number] Visit Date: 12/04/2016              Requested by: Eulas Post, MD [place], [place] [address] PCP: Eulas Post, MD   Assessment & Plan: Visit Diagnoses:  1. Trochanteric bursitis, left hip     Plan: I do feel this trochanteric bursitis well. I'll see him back in a month consider repeat injection over trochanteric area. I showed him stretching exercises to try and we talked about formal physical therapy at some point. We'll send in some topical anti-inflammatories as well. We'll see how this does for him. His were encouraged and answered.  Follow-Up Instructions: Return in about 4 weeks (around 01/01/2017).   Orders:  No orders of the defined types were placed in this encounter.  Meds ordered this encounter  Medications  . diclofenac sodium (VOLTAREN) 1 % GEL    Sig: Apply 2 g topically 4 (four) times daily.    Dispense:  3 Tube    Refill:  3      Procedures: No procedures performed   Clinical Data: No additional findings.   Subjective: Chief Complaint  Patient presents with  . Left Hip - Pain  The patient is very pleasant 71 year old referred from Dr. Jarold Song to evaluate his left hip. He actually only a 2 weeks ago had a left hip trochanteric injection for bursitis by Dr. Jarold Song. This is very where he points though is a source of his pain. He is also limited with chronic back pain for years now. He's had 3 separate back surgeries.  HPI  Review of Systems He denies any recent illnesses. Denies any fever, chills, nausea, vomiting, headache, chest pain, shortness of breath  Objective: Vital Signs: There were no vitals taken for this visit.  Physical Exam He is alert and 3 and in no acute distress is very slow to mobilize. Ortho Exam I can easily put his left hip to internal rotation rotation as well as flexion extension  with no pain in the groin at all. It moved smoothly. His pain is only over the trochanteric area and some on the IT band. Specialty Comments:  No specialty comments available.  Imaging: No results found. X-rays of the left hip on the canopy system independently reviewed by me show well located left hip. There is no significant arthritis in the hip at all. The hip shows a well maintained joint space. There is no cortical irregularity around trochanteric area.  PMFS History: Patient Active Problem List   Diagnosis Date Noted  . Trochanteric bursitis, left hip 12/04/2016  . Parkinson disease ([place]) 05/16/2016  . Abnormality of gait 05/16/2016  . Knee pain 11/01/2013  . Traumatic bursitis 11/01/2013  . Hx of fall 11/01/2013  . Memory difficulties 04/22/2013  . Hemorrhoids 02/24/2013  . PRIMARY CENTRAL SLEEP APNEA 05/21/2010  . NOCTURIA 05/21/2010  . HYPERSOMNIA, ASSOCIATED WITH SLEEP APNEA 03/26/2010  . ABDOMINAL PAIN RIGHT LOWER QUADRANT 03/20/2010  . TREMOR, ESSENTIAL 11/22/2009  . SPINAL STENOSIS, LUMBAR 06/07/2009  . OSTEOARTHRITIS, LOWER LEG, LEFT 05/16/2009  . MYOFASCIAL PAIN SYNDROME 05/16/2009  . URINARY FREQUENCY, CHRONIC 02/09/2009  . DISUSE OSTEOPOROSIS 09/07/2008  . HYPOGONADISM, MALE 07/01/2008  . HYPOKALEMIA 04/19/2008  . SYNCOPE 04/19/2008  . UNS ADVRS EFF UNS RX MEDICINAL&BIOLOGICAL SBSTNC 02/26/2008  .  CONSTIPATION, CHRONIC 11/23/2007  . HYPERLIPIDEMIA 05/15/2007  . Right-sided low back pain without sciatica 05/15/2007  . Osteoporosis 05/15/2007  . DIVERTICULOSIS, COLON 02/23/2007  . Essential hypertension 02/12/2007  . GERD 02/12/2007  . GASTRITIS 02/12/2007   Past Medical History:  Diagnosis Date  . Abdominal pain, right lower quadrant   . Abnormality of gait 05/16/2016  . Acute prostatitis   . Anxiety   . Constipation   . Degenerative disc disease   . Depression   . Diverticulosis of colon (without mention of hemorrhage)   . Essential and other  specified forms of tremor   . Family history of malignant neoplasm of gastrointestinal tract   . Fibromyalgia   . Ganglion and cyst of synovium, tendon, and bursa   . GERD (gastroesophageal reflux disease)   . Hemarthrosis, upper arm   . Hyperlipidemia   . Hypersomnia with sleep apnea, unspecified   . Hypertension   . Hypopotassemia   . Localized osteoarthrosis not specified whether primary or secondary, lower leg   . Lumbago   . Memory difficulties 04/22/2013  . Nocturia   . Osteoporosis, unspecified   . Other testicular hypofunction   . Parkinson disease ([place]) 05/16/2016  . Right inguinal hernia   . Sleep apnea    last sleep study 11/11 on chart- Bipap with settings of 4 per  patient  . Spinal stenosis, lumbar region, without neurogenic claudication   . Syncope and collapse   . Unspecified adverse effect of unspecified drug, medicinal and biological substance   . Unspecified gastritis and gastroduodenitis without mention of hemorrhage   . Urinary frequency     Family History  Problem Relation Age of Onset  . Colon cancer Mother   . Dementia Mother   . Parkinsonism Father 27  . Lung cancer Paternal Grandfather     smoker  . Liver disease Neg Hx   . Kidney disease Neg Hx   . Esophageal cancer Neg Hx     Past Surgical History:  Procedure Laterality Date  . BACK SURGERY     x 3  . [place]  . INGUINAL HERNIA REPAIR  07/19/2011   Procedure: LAPAROSCOPIC INGUINAL HERNIA;  Surgeon: Odis Hollingshead, MD;  Location: [facility];  Service: General;  Laterality: Right;  Laparoscopic Repair of Recurrent Right  Ingunial Hernia with Mesh  . microdisectomy  03/02/2003, 06/29/2003, 12/24/2005  . TONSILLECTOMY     Social History   Occupational History  . retired Retired   Social History Main Topics  . Smoking status: Never Smoker  . Smokeless tobacco: Never Used  . Alcohol use No     Comment: less than 5 per week  . Drug use: No  . Sexual activity: Not on file

## 2016-12-09 DIAGNOSIS — N3941 Urge incontinence: Secondary | ICD-10-CM | POA: Diagnosis not present

## 2016-12-09 DIAGNOSIS — R350 Frequency of micturition: Secondary | ICD-10-CM | POA: Diagnosis not present

## 2016-12-26 ENCOUNTER — Other Ambulatory Visit: Payer: Self-pay | Admitting: *Deleted

## 2016-12-26 MED ORDER — GABAPENTIN 100 MG PO CAPS: 200.0000 mg | ORAL_CAPSULE | Freq: Three times a day (TID) | ORAL | 2 refills | Status: DC

## 2016-12-26 NOTE — Telephone Encounter (Addendum)
Received request via fax from pt pharmacy to send new rx refill for gabapentin 100mg  capsules. They placed note that patient stated directions are suppose to be 2 capsules 3 times daily, NOT 1 capsule 3 times daily.

## 2016-12-26 NOTE — Telephone Encounter (Signed)
Gabapentin dose has been increased to 3 times daily, I will call in a prescription to cover this increase.

## 2016-12-29 ENCOUNTER — Other Ambulatory Visit: Payer: Self-pay | Admitting: Family Medicine

## 2017-01-01 ENCOUNTER — Ambulatory Visit (INDEPENDENT_AMBULATORY_CARE_PROVIDER_SITE_OTHER): Payer: PPO | Admitting: Orthopaedic Surgery

## 2017-01-01 ENCOUNTER — Encounter (INDEPENDENT_AMBULATORY_CARE_PROVIDER_SITE_OTHER): Payer: Self-pay

## 2017-01-01 DIAGNOSIS — M7062 Trochanteric bursitis, left hip: Secondary | ICD-10-CM

## 2017-01-01 MED ORDER — LIDOCAINE HCL 1 % IJ SOLN
3.0000 mL | INTRAMUSCULAR | Status: AC | PRN
  Administered 2017-01-01: 3 mL

## 2017-01-01 MED ORDER — METHYLPREDNISOLONE ACETATE 40 MG/ML IJ SUSP
40.0000 mg | INTRAMUSCULAR | Status: AC | PRN
  Administered 2017-01-01: 40 mg via INTRA_ARTICULAR

## 2017-01-01 NOTE — Progress Notes (Signed)
Office Visit Note   Patient: [name].           Date of Birth: July 13, 1946           MRN: [id number] Visit Date: 01/01/2017              Requested by: Eulas Post, MD [place], [place] [address] PCP: Eulas Post, MD   Assessment & Plan: Visit Diagnoses:  1. Trochanteric bursitis, left hip     Plan: He tolerated the trochanteric injection well on the left side. Like to send her to physical therapy to work on his back and his hip. I'll see him back in about 6 weeks to see what therapy is done for him.  Follow-Up Instructions: Return in about 6 weeks (around 02/12/2017).   Orders:  No orders of the defined types were placed in this encounter.  No orders of the defined types were placed in this encounter.     Procedures: Large Joint Inj Date/Time: 01/01/2017 10:27 AM Performed by: Mcarthur Rossetti Authorized by: Mcarthur Rossetti   Location:  Hip Site:  L greater trochanter Ultrasound Guidance: No   Fluoroscopic Guidance: No   Arthrogram: No   Medications:  3 mL lidocaine 1 %; 40 mg methylPREDNISolone acetate 40 MG/ML     Clinical Data: No additional findings.   Subjective: No chief complaint on file. Patient is someone I've seen before. He has chronic left hip trochanteric bursitis. He is also had multiple back surgeries. He is a thin individual Parkinson disease and has chronic lumbar spine scoliosis. This is been a spiraling effect in terms of disability on his body and pain. I want to see him today for trochanteric injection and see about potentially getting him into physical therapy for dry needling and work on balance coordination core strengthening and posture.  HPI  Review of Systems He denies any recent illnesses. Denies any fever and chills.  Objective: Vital Signs: There were no vitals taken for this visit.  Physical Exam He is alert and oriented 3 and very slow to mobilize. He is a very thin  individual. Ortho Exam Again examination of his left hip shows fluid range of motion of the left hip. His pain is only over trochanteric area. December on the right side as well. Specialty Comments:  No specialty comments available.  Imaging: No results found. I reviewed x-rays from last time and shows no significant arthritic changes in his hip but severe disease at multiple levels of lumbar spine and thoracic spine.  PMFS History: Patient Active Problem List   Diagnosis Date Noted  . Trochanteric bursitis, left hip 12/04/2016  . Parkinson disease ([place]) 05/16/2016  . Abnormality of gait 05/16/2016  . Knee pain 11/01/2013  . Traumatic bursitis 11/01/2013  . Hx of fall 11/01/2013  . Memory difficulties 04/22/2013  . Hemorrhoids 02/24/2013  . PRIMARY CENTRAL SLEEP APNEA 05/21/2010  . NOCTURIA 05/21/2010  . HYPERSOMNIA, ASSOCIATED WITH SLEEP APNEA 03/26/2010  . ABDOMINAL PAIN RIGHT LOWER QUADRANT 03/20/2010  . TREMOR, ESSENTIAL 11/22/2009  . SPINAL STENOSIS, LUMBAR 06/07/2009  . OSTEOARTHRITIS, LOWER LEG, LEFT 05/16/2009  . MYOFASCIAL PAIN SYNDROME 05/16/2009  . URINARY FREQUENCY, CHRONIC 02/09/2009  . DISUSE OSTEOPOROSIS 09/07/2008  . HYPOGONADISM, MALE 07/01/2008  . HYPOKALEMIA 04/19/2008  . SYNCOPE 04/19/2008  . UNS ADVRS EFF UNS RX MEDICINAL&BIOLOGICAL SBSTNC 02/26/2008  . CONSTIPATION, CHRONIC 11/23/2007  . HYPERLIPIDEMIA 05/15/2007  . Right-sided low back pain without sciatica 05/15/2007  .  Osteoporosis 05/15/2007  . DIVERTICULOSIS, COLON 02/23/2007  . Essential hypertension 02/12/2007  . GERD 02/12/2007  . GASTRITIS 02/12/2007   Past Medical History:  Diagnosis Date  . Abdominal pain, right lower quadrant   . Abnormality of gait 05/16/2016  . Acute prostatitis   . Anxiety   . Constipation   . Degenerative disc disease   . Depression   . Diverticulosis of colon (without mention of hemorrhage)   . Essential and other specified forms of tremor   . Family history  of malignant neoplasm of gastrointestinal tract   . Fibromyalgia   . Ganglion and cyst of synovium, tendon, and bursa   . GERD (gastroesophageal reflux disease)   . Hemarthrosis, upper arm   . Hyperlipidemia   . Hypersomnia with sleep apnea, unspecified   . Hypertension   . Hypopotassemia   . Localized osteoarthrosis not specified whether primary or secondary, lower leg   . Lumbago   . Memory difficulties 04/22/2013  . Nocturia   . Osteoporosis, unspecified   . Other testicular hypofunction   . Parkinson disease ([place]) 05/16/2016  . Right inguinal hernia   . Sleep apnea    last sleep study 11/11 on chart- Bipap with settings of 4 per  patient  . Spinal stenosis, lumbar region, without neurogenic claudication   . Syncope and collapse   . Unspecified adverse effect of unspecified drug, medicinal and biological substance   . Unspecified gastritis and gastroduodenitis without mention of hemorrhage   . Urinary frequency     Family History  Problem Relation Age of Onset  . Colon cancer Mother   . Dementia Mother   . Parkinsonism Father 20  . Lung cancer Paternal Grandfather        smoker  . Liver disease Neg Hx   . Kidney disease Neg Hx   . Esophageal cancer Neg Hx     Past Surgical History:  Procedure Laterality Date  . BACK SURGERY     x 3  . [place]  . INGUINAL HERNIA REPAIR  07/19/2011   Procedure: LAPAROSCOPIC INGUINAL HERNIA;  Surgeon: Odis Hollingshead, MD;  Location: [facility];  Service: General;  Laterality: Right;  Laparoscopic Repair of Recurrent Right  Ingunial Hernia with Mesh  . microdisectomy  03/02/2003, 06/29/2003, 12/24/2005  . TONSILLECTOMY     Social History   Occupational History  . retired Retired   Social History Main Topics  . Smoking status: Never Smoker  . Smokeless tobacco: Never Used  . Alcohol use No     Comment: less than 5 per week  . Drug use: No  . Sexual activity: Not on file

## 2017-01-10 ENCOUNTER — Ambulatory Visit: Payer: PPO | Admitting: Neurology

## 2017-01-15 DIAGNOSIS — R350 Frequency of micturition: Secondary | ICD-10-CM | POA: Diagnosis not present

## 2017-01-15 DIAGNOSIS — N3941 Urge incontinence: Secondary | ICD-10-CM | POA: Diagnosis not present

## 2017-01-27 ENCOUNTER — Ambulatory Visit (INDEPENDENT_AMBULATORY_CARE_PROVIDER_SITE_OTHER): Payer: PPO | Admitting: Neurology

## 2017-01-27 ENCOUNTER — Encounter: Payer: Self-pay | Admitting: Neurology

## 2017-01-27 VITALS — BP 121/70 | HR 46 | Ht 68.0 in | Wt 156.0 lb

## 2017-01-27 DIAGNOSIS — R269 Unspecified abnormalities of gait and mobility: Secondary | ICD-10-CM | POA: Diagnosis not present

## 2017-01-27 DIAGNOSIS — R413 Other amnesia: Secondary | ICD-10-CM | POA: Diagnosis not present

## 2017-01-27 DIAGNOSIS — G20 Parkinson's disease: Secondary | ICD-10-CM | POA: Diagnosis not present

## 2017-01-27 DIAGNOSIS — M48061 Spinal stenosis, lumbar region without neurogenic claudication: Secondary | ICD-10-CM | POA: Diagnosis not present

## 2017-01-27 MED ORDER — CARBIDOPA-LEVODOPA 25-100 MG PO TABS: 1.5000 | ORAL_TABLET | Freq: Three times a day (TID) | ORAL | 1 refills | Status: DC

## 2017-01-27 MED ORDER — DULOXETINE HCL 30 MG PO CPEP: 30.0000 mg | ORAL_CAPSULE | Freq: Every day | ORAL | 3 refills | Status: DC

## 2017-01-27 NOTE — Patient Instructions (Signed)
   We will start Cymbalta 30 mg daily. With the gabapentin 100 mg three times a day for 2 weeks, then stop the medication.  We will go up to 1.5 tablets three times a day of the sinemet 25/100 mg tablet, add 1/2 tablet to the schedule each week until up to 1.5 tablets three times a day.  Sinemet (carbidopa) may result in confusion or hallucinations, drowsiness, nausea, or dizziness. If any significant side effects are noted, please contact our office. Sinemet may not be well absorbed when taken with high protein meals, if tolerated it is best to take 30-45 minutes before you eat.

## 2017-01-27 NOTE — Progress Notes (Signed)
Reason for visit: Parkinson's disease  [name]. is an 71 y.o. male  History of present illness:  [name] is a 71 year old right-handed white male with a history of Parkinson's disease. The patient is also had some chronic low back pain and right sided trochanteric bursitis. The patient has freezing events that occur off and on, he has not had any falls fortunately. He may use a cane or a walker. At times he feels like his body gets ahead of his feet. The patient is not sleeping well at night, he has to get up frequently urinate. He has dizziness with standing. On the Sinemet he does not believe that his mobility has improved at all. He has had some slight confusion when he first was getting on the medication. He has not had any hallucinations. He denies problems with choking with swallowing or problems with drooling. He does have some mild memory troubles. He returns to this office for an evaluation.  Past Medical History:  Diagnosis Date  . Abdominal pain, right lower quadrant   . Abnormality of gait 05/16/2016  . Acute prostatitis   . Anxiety   . Constipation   . Degenerative disc disease   . Depression   . Diverticulosis of colon (without mention of hemorrhage)   . Essential and other specified forms of tremor   . Family history of malignant neoplasm of gastrointestinal tract   . Fibromyalgia   . Ganglion and cyst of synovium, tendon, and bursa   . GERD (gastroesophageal reflux disease)   . Hemarthrosis, upper arm   . Hyperlipidemia   . Hypersomnia with sleep apnea, unspecified   . Hypertension   . Hypopotassemia   . Localized osteoarthrosis not specified whether primary or secondary, lower leg   . Lumbago   . Memory difficulties 04/22/2013  . Nocturia   . Osteoporosis, unspecified   . Other testicular hypofunction   . Parkinson disease ([place]) 05/16/2016  . Right inguinal hernia   . Sleep apnea    last sleep study 11/11 on chart- Bipap with settings of 4 per   patient  . Spinal stenosis, lumbar region, without neurogenic claudication   . Syncope and collapse   . Unspecified adverse effect of unspecified drug, medicinal and biological substance   . Unspecified gastritis and gastroduodenitis without mention of hemorrhage   . Urinary frequency     Past Surgical History:  Procedure Laterality Date  . BACK SURGERY     x 3  . [place]  . INGUINAL HERNIA REPAIR  07/19/2011   Procedure: LAPAROSCOPIC INGUINAL HERNIA;  Surgeon: Odis Hollingshead, MD;  Location: [facility];  Service: General;  Laterality: Right;  Laparoscopic Repair of Recurrent Right  Ingunial Hernia with Mesh  . microdisectomy  03/02/2003, 06/29/2003, 12/24/2005  . TONSILLECTOMY      Family History  Problem Relation Age of Onset  . Colon cancer Mother   . Dementia Mother   . Parkinsonism Father 94  . Lung cancer Paternal Grandfather        smoker  . Liver disease Neg Hx   . Kidney disease Neg Hx   . Esophageal cancer Neg Hx     Social history:  reports that he has never smoked. He has never used smokeless tobacco. He reports that he does not drink alcohol or use drugs.    Allergies  Allergen Reactions  . Nucynta [Tapentadol] Other (See Comments)    dizziness  . Pregabalin  REACTION: dizziness and mental status changes  . Exelon [Rivastigmine]     abd pain    Medications:  Prior to Admission medications   Medication Sig Start Date End Date Taking? Authorizing Provider  amLODipine (NORVASC) 5 MG tablet Take 1 tablet (5 mg total) by mouth daily. 07/30/16  Yes Burchette, Alinda Sierras, MD  Calcium Carbonate-Vitamin D (CALCIUM 600 + D PO) Take 1 tablet by mouth 2 (two) times daily.    Yes [provider]  carbidopa-levodopa (SINEMET IR) 25-100 MG tablet Take 1 tablet by mouth 3 (three) times daily. 08/22/16  Yes Kathrynn Ducking, MD  cholecalciferol (VITAMIN D) 1000 UNITS tablet Take 5,000 Units by mouth daily.    Yes [provider]  citalopram  (CELEXA) 40 MG tablet TAKE 1 TABLET BY MOUTH IN THE MORNING 10/23/16  Yes Burchette, Alinda Sierras, MD  Coenzyme Q10 200 MG capsule Take 200 mg by mouth daily.    Yes [provider]  diclofenac sodium (VOLTAREN) 1 % GEL Apply 2 g topically 4 (four) times daily. 12/04/16  Yes Mcarthur Rossetti, MD  gabapentin (NEURONTIN) 100 MG capsule Take 2 capsules (200 mg total) by mouth 3 (three) times daily. 12/26/16  Yes Kathrynn Ducking, MD  Krill Oil 1000 MG CAPS Take 1 capsule by mouth 2 (two) times daily.   Yes [provider]  losartan (COZAAR) 100 MG tablet TAKE 1 TABLET (100 MG TOTAL) BY MOUTH DAILY. 11/07/16  Yes Burchette, Alinda Sierras, MD  MAGNESIUM CITRATE PO Take 200 mg by mouth 2 (two) times daily.    Yes [provider]  memantine (NAMENDA) 10 MG tablet TAKE 1 TABLET (10 MG TOTAL) BY MOUTH 2 (TWO) TIMES DAILY. 11/07/16  Yes Kathrynn Ducking, MD  metoprolol tartrate (LOPRESSOR) 25 MG tablet TAKE ONE TABLET ONCE PER DAY. 12/30/16  Yes Burchette, Alinda Sierras, MD  Nutritional Supplements (ANTI-OXIDANT COMPLEX PO) Take 10 mg by mouth daily.    Yes [provider]  potassium chloride SA (K-DUR,KLOR-CON) 20 MEQ tablet TAKE 1 TABLETS BY MOUTH DAILY. 02/06/16  Yes Burchette, Alinda Sierras, MD  Probiotic Product (ALIGN) 4 MG CAPS Take by mouth daily.    Yes [provider]  vitamin B-12 (CYANOCOBALAMIN) 50 MCG tablet Take 50 mcg by mouth daily.    Yes [provider]    ROS:  Out of a complete 14 system review of symptoms, the patient complains only of the following symptoms, and all other reviewed systems are negative.  Activity change, fatigue Abdominal pain, constipation Sleep apnea, daytime sleepiness, snoring Incontinence of the bladder, frequency of urination, urinary urgency Joint pain, back pain, walking difficulty Bruising easily Memory loss, dizziness, numbness of the feet, speech problems, weakness, tremors Decreased concentration  Blood pressure  121/70, pulse (!) 46, height 5\' 8"  (1.727 m), weight 156 lb (70.8 kg), SpO2 98 %.   Blood pressure, sitting, right arm is 174/72. Blood pressure, right arm, standing is 154/66.  Physical Exam  General: The patient is alert and cooperative at the time of the examination.  Skin: No significant peripheral edema is noted.   Neurologic Exam  Mental status: The patient is alert and oriented x 3 at the time of the examination. The patient has apparent normal recent and remote memory, with an apparently normal attention span and concentration ability. Mini-Mental Status Examination done today shows a total score 28/30.   Cranial nerves: Facial symmetry is present. Speech is normal, no aphasia or dysarthria is noted. Extraocular  movements are full. Visual fields are full. Mild masking of the face is seen.  Motor: The patient has good strength in all 4 extremities.  Sensory examination: Soft touch sensation is symmetric on the face, arms, and legs.  Coordination: The patient has good finger-nose-finger and heel-to-shin bilaterally.  Gait and station: The patient has a stooped posture with walking, the patient is able to walk independently. May have some has a with turns. Tandem gait is slightly unsteady. Romberg is negative. No drift is seen.  Reflexes: Deep tendon reflexes are symmetric.   Assessment/Plan:  1. Parkinson's disease  2. Gait disturbance  3. Mild memory disturbance  The patient does not appear to be orthostatic on clinical examination today. He does report some dizziness that is most prominent with standing. In the past he has been documented to have blood pressure drops with standing. The patient will be increased on the Sinemet to eventually take 1.5 of the 25/100 mg tablets 3 times daily. He will add a half tablet each week until up to this dose. The patient does not believe that the gabapentin has been helpful for his pain, we will go to 100 mg 3 times daily for one week  and stop the drug, we will start Cymbalta. He will begin a 30 mg tablet, if he tolerates this, we may consider going up on the dose and tapering him off of Celexa. The patient will follow-up in 4 months. He will call for any dose adjustments.  Jill Alexanders MD 01/27/2017 3:08 PM  [facility] [address] [place], [place] [address]  Phone [phone] Fax [phone]

## 2017-02-09 ENCOUNTER — Other Ambulatory Visit: Payer: Self-pay | Admitting: Family Medicine

## 2017-02-17 ENCOUNTER — Ambulatory Visit (INDEPENDENT_AMBULATORY_CARE_PROVIDER_SITE_OTHER): Payer: PPO | Admitting: Orthopaedic Surgery

## 2017-02-17 DIAGNOSIS — M7062 Trochanteric bursitis, left hip: Secondary | ICD-10-CM

## 2017-02-17 NOTE — Progress Notes (Signed)
Mr. Bouwens is following up after a steroid injection over his left hip trochanteric area. He says afterward injection he felt great for only that day. Somehow he got dropped to the cracks about him having physical therapy. He says he does have an appointment next week with a physical therapist.  On examination both hips move fluidly with with good range of motion. His pain is only over the trochanteric area on now both the left and right sides. He does have Parkinson's disease as well. There is no pain in the groin and again his hips move fluidly.  Most likely the physical therapy is going to help with him greatly. He'll still work on a stretching exercises and he can try over-the-counter topical anti-inflammatories as well. He'll work on activity modification and from my standpoint can follow up as needed. All questions were encouraged and answered.

## 2017-02-21 NOTE — Progress Notes (Deleted)
Subjective:   [name]. is a 71 y.o. male who presents for an Initial Medicare Annual Wellness Visit.  Review of Systems  No ROS.  Medicare Wellness Visit. Additional risk factors are reflected in the social history.    Sleep patterns:    Home Safety/Smoke Alarms: Feels safe in home. Smoke alarms in place.  Living environment; residence and Firearm Safety:  [place] Safety/Bike Helmet: Wears seat belt.   Counseling:   Dental-  Male:   CCS-    02/02/2013 3 year recall. PSA-  Lab Results  Component Value Date   PSA 0.95 03/02/2014   PSA 1.19 09/14/2009   PSA 0.76 02/03/2009      Objective:    There were no vitals filed for this visit. There is no height or weight on file to calculate BMI.  Current Medications (verified) Outpatient Encounter Prescriptions as of 02/27/2017  Medication Sig  . amLODipine (NORVASC) 5 MG tablet Take 1 tablet (5 mg total) by mouth daily.  . Calcium Carbonate-Vitamin D (CALCIUM 600 + D PO) Take 1 tablet by mouth 2 (two) times daily.   . carbidopa-levodopa (SINEMET IR) 25-100 MG tablet Take 1.5 tablets by mouth 3 (three) times daily.  . cholecalciferol (VITAMIN D) 1000 UNITS tablet Take 5,000 Units by mouth daily.   . citalopram (CELEXA) 40 MG tablet TAKE 1 TABLET BY MOUTH IN THE MORNING  . Coenzyme Q10 200 MG capsule Take 200 mg by mouth daily.   . diclofenac sodium (VOLTAREN) 1 % GEL Apply 2 g topically 4 (four) times daily.  . DULoxetine (CYMBALTA) 30 MG capsule Take 1 capsule (30 mg total) by mouth daily.  Marland Kitchen KLOR-CON M20 20 MEQ tablet TAKE 3 TABLETS (60 MEQ TOTAL) BY MOUTH DAILY.  Marland Kitchen Krill Oil 1000 MG CAPS Take 1 capsule by mouth 2 (two) times daily.  Marland Kitchen losartan (COZAAR) 100 MG tablet TAKE 1 TABLET (100 MG TOTAL) BY MOUTH DAILY.  Marland Kitchen MAGNESIUM CITRATE PO Take 200 mg by mouth 2 (two) times daily.   . memantine (NAMENDA) 10 MG tablet TAKE 1 TABLET (10 MG TOTAL) BY MOUTH 2 (TWO) TIMES DAILY.  . metoprolol tartrate (LOPRESSOR) 25 MG  tablet TAKE ONE TABLET ONCE PER DAY.  Marland Kitchen Nutritional Supplements (ANTI-OXIDANT COMPLEX PO) Take 10 mg by mouth daily.   . potassium chloride SA (K-DUR,KLOR-CON) 20 MEQ tablet TAKE 1 TABLETS BY MOUTH DAILY.  . Probiotic Product (ALIGN) 4 MG CAPS Take by mouth daily.   . vitamin B-12 (CYANOCOBALAMIN) 50 MCG tablet Take 50 mcg by mouth daily.    No facility-administered encounter medications on file as of 02/27/2017.     Allergies (verified) Nucynta [tapentadol]; Pregabalin; and Exelon [rivastigmine]   History: Past Medical History:  Diagnosis Date  . Abdominal pain, right lower quadrant   . Abnormality of gait 05/16/2016  . Acute prostatitis   . Anxiety   . Constipation   . Degenerative disc disease   . Depression   . Diverticulosis of colon (without mention of hemorrhage)   . Essential and other specified forms of tremor   . Family history of malignant neoplasm of gastrointestinal tract   . Fibromyalgia   . Ganglion and cyst of synovium, tendon, and bursa   . GERD (gastroesophageal reflux disease)   . Hemarthrosis, upper arm   . Hyperlipidemia   . Hypersomnia with sleep apnea, unspecified   . Hypertension   . Hypopotassemia   . Localized osteoarthrosis not specified whether primary or secondary, lower  leg   . Lumbago   . Memory difficulties 04/22/2013  . Nocturia   . Osteoporosis, unspecified   . Other testicular hypofunction   . Parkinson disease ([place]) 05/16/2016  . Right inguinal hernia   . Sleep apnea    last sleep study 11/11 on chart- Bipap with settings of 4 per  patient  . Spinal stenosis, lumbar region, without neurogenic claudication   . Syncope and collapse   . Unspecified adverse effect of unspecified drug, medicinal and biological substance   . Unspecified gastritis and gastroduodenitis without mention of hemorrhage   . Urinary frequency    Past Surgical History:  Procedure Laterality Date  . BACK SURGERY     x 3  . [place]  . INGUINAL  HERNIA REPAIR  07/19/2011   Procedure: LAPAROSCOPIC INGUINAL HERNIA;  Surgeon: Odis Hollingshead, MD;  Location: [facility];  Service: General;  Laterality: Right;  Laparoscopic Repair of Recurrent Right  Ingunial Hernia with Mesh  . microdisectomy  03/02/2003, 06/29/2003, 12/24/2005  . TONSILLECTOMY     Family History  Problem Relation Age of Onset  . Colon cancer Mother   . Dementia Mother   . Parkinsonism Father 82  . Lung cancer Paternal Grandfather        smoker  . Liver disease Neg Hx   . Kidney disease Neg Hx   . Esophageal cancer Neg Hx    Social History   Occupational History  . retired Retired   Social History Main Topics  . Smoking status: Never Smoker  . Smokeless tobacco: Never Used  . Alcohol use No     Comment: less than 5 per week  . Drug use: No  . Sexual activity: Not on file   Tobacco Counseling Counseling given: Not Answered   Activities of Daily Living No flowsheet data found.  Immunizations and Health Maintenance Immunization History  Administered Date(s) Administered  . Influenza Whole 08/19/2005, 05/09/2008, 05/16/2009, 05/21/2010  . Influenza, High Dose Seasonal PF 05/14/2016  . Influenza,inj,Quad PF,36+ Mos 05/07/2013, 06/14/2014  . Influenza-Unspecified 06/02/2015  . Pneumococcal Polysaccharide-23 03/07/2014  . Td 08/19/2001  . Tdap 03/07/2014  . Zoster 09/24/2013   Health Maintenance Due  Topic Date Due  . Hepatitis C Screening  09-May-1946  . PNA vac Low Risk Adult (2 of 2 - PCV13) 03/08/2015  . COLONOSCOPY  02/03/2016    Patient Care Team: Eulas Post, MD as PCP - General (Family Medicine)  Indicate any recent Medical Services you may have received from other than [facility] providers in the past year (date may be approximate).    Assessment:   This is a routine wellness examination for [name]. Physical assessment deferred to PCP.  Hearing/Vision screen No exam data present  Dietary issues and exercise activities discussed:     Diet (meal preparation, eat out, water intake, caffeinated beverages, dairy products, fruits and vegetables):  Breakfast: Lunch:  Dinner:      Goals    None     Depression Screen PHQ 2/9 Scores 05/14/2016 03/28/2015 03/07/2014 04/14/2012  PHQ - 2 Score 0 0 1 2    Fall Risk Fall Risk  05/14/2016 03/28/2015 03/07/2014  Falls in the past year? No No Yes  Risk for fall due to : - - Impaired balance/gait    Cognitive Function: MMSE - Mini Mental State Exam 01/27/2017 05/16/2016 11/09/2015 05/11/2015 11/03/2014  Orientation to time 4 5 5 5 5   Orientation to Place 5 5 5 5  5  Registration 3 3 3 3 3   Attention/ Calculation 5 5 5 5 5   Recall 3 3 3 3 3   Language- name 2 objects 2 2 2 2 2   Language- repeat 1 1 1 1 1   Language- follow 3 step command 3 3 3 3 3   Language- read & follow direction 1 1 1 1 1   Write a sentence 1 1 1 1 1   Copy design 0 1 1 1 1   Total score 28 30 30 30 30         Screening Tests Health Maintenance  Topic Date Due  . Hepatitis C Screening  02-Jul-1946  . PNA vac Low Risk Adult (2 of 2 - PCV13) 03/08/2015  . COLONOSCOPY  02/03/2016  . INFLUENZA VACCINE  03/19/2017  . TETANUS/TDAP  03/07/2024        Plan:   ***  I have personally reviewed and noted the following in the patient's chart:   . Medical and social history . Use of alcohol, tobacco or illicit drugs  . Current medications and supplements . Functional ability and status . Nutritional status . Physical activity . Advanced directives . List of other physicians . Vitals . Screenings to include cognitive, depression, and falls . Referrals and appointments  In addition, I have reviewed and discussed with patient certain preventive protocols, quality metrics, and best practice recommendations. A written personalized care plan for preventive services as well as general preventive health recommendations were provided to patient.     Ree Edman, RN   02/21/2017

## 2017-02-21 NOTE — Progress Notes (Deleted)
Pre visit review using our clinic review tool, if applicable. No additional management support is needed unless otherwise documented below in the visit note. 

## 2017-02-27 ENCOUNTER — Ambulatory Visit: Payer: PPO

## 2017-02-27 DIAGNOSIS — M545 Low back pain: Secondary | ICD-10-CM | POA: Diagnosis not present

## 2017-02-27 DIAGNOSIS — M7062 Trochanteric bursitis, left hip: Secondary | ICD-10-CM | POA: Diagnosis not present

## 2017-02-28 ENCOUNTER — Ambulatory Visit (INDEPENDENT_AMBULATORY_CARE_PROVIDER_SITE_OTHER): Payer: PPO | Admitting: Family Medicine

## 2017-02-28 ENCOUNTER — Encounter: Payer: Self-pay | Admitting: Family Medicine

## 2017-02-28 VITALS — BP 130/70 | HR 50 | Temp 98.5°F | Wt 148.0 lb

## 2017-02-28 DIAGNOSIS — G20 Parkinson's disease: Secondary | ICD-10-CM | POA: Diagnosis not present

## 2017-02-28 DIAGNOSIS — I10 Essential (primary) hypertension: Secondary | ICD-10-CM

## 2017-02-28 DIAGNOSIS — I951 Orthostatic hypotension: Secondary | ICD-10-CM

## 2017-02-28 NOTE — Progress Notes (Signed)
Subjective:     Patient ID: [name]., male   DOB: [birth year], 71 y.o.   MRN: [id number]  HPI Patient seen for medical follow-up. He has Parkinson's disease and is followed closely by neurology and is on Sinemet. He is on 3 drug regimen for hypertension including amlodipine, metoprolol, losartan. He's recently had frequent issues of lightheadedness and dizziness especially with standing. No syncope. Compliant with medications. No chest pains.  Ongoing issues with some hip pain. He's had multiple injections per orthopedics and currently getting some benefit from physical therapy. Denies recent fall.  Past Medical History:  Diagnosis Date  . Abdominal pain, right lower quadrant   . Abnormality of gait 05/16/2016  . Acute prostatitis   . Anxiety   . Constipation   . Degenerative disc disease   . Depression   . Diverticulosis of colon (without mention of hemorrhage)   . Essential and other specified forms of tremor   . Family history of malignant neoplasm of gastrointestinal tract   . Fibromyalgia   . Ganglion and cyst of synovium, tendon, and bursa   . GERD (gastroesophageal reflux disease)   . Hemarthrosis, upper arm   . Hyperlipidemia   . Hypersomnia with sleep apnea, unspecified   . Hypertension   . Hypopotassemia   . Localized osteoarthrosis not specified whether primary or secondary, lower leg   . Lumbago   . Memory difficulties 04/22/2013  . Nocturia   . Osteoporosis, unspecified   . Other testicular hypofunction   . Parkinson disease ([place]) 05/16/2016  . Right inguinal hernia   . Sleep apnea    last sleep study 11/11 on chart- Bipap with settings of 4 per  patient  . Spinal stenosis, lumbar region, without neurogenic claudication   . Syncope and collapse   . Unspecified adverse effect of unspecified drug, medicinal and biological substance   . Unspecified gastritis and gastroduodenitis without mention of hemorrhage   . Urinary frequency    Past Surgical History:   Procedure Laterality Date  . BACK SURGERY     x 3  . [place]  . INGUINAL HERNIA REPAIR  07/19/2011   Procedure: LAPAROSCOPIC INGUINAL HERNIA;  Surgeon: Odis Hollingshead, MD;  Location: [facility];  Service: General;  Laterality: Right;  Laparoscopic Repair of Recurrent Right  Ingunial Hernia with Mesh  . microdisectomy  03/02/2003, 06/29/2003, 12/24/2005  . TONSILLECTOMY      reports that he has never smoked. He has never used smokeless tobacco. He reports that he does not drink alcohol or use drugs. family history includes Colon cancer in his mother; Dementia in his mother; Lung cancer in his paternal grandfather; Parkinsonism (age of onset: 33) in his father. Allergies  Allergen Reactions  . Nucynta [Tapentadol] Other (See Comments)    dizziness  . Pregabalin     REACTION: dizziness and mental status changes  . Exelon [Rivastigmine]     abd pain     Review of Systems  Constitutional: Negative for chills, fatigue and fever.  Eyes: Negative for visual disturbance.  Respiratory: Negative for cough, chest tightness and shortness of breath.   Cardiovascular: Negative for chest pain, palpitations and leg swelling.  Genitourinary: Negative for dysuria.  Neurological: Positive for dizziness and light-headedness. Negative for syncope, weakness and headaches.       Objective:   Physical Exam  Constitutional: He is oriented to person, place, and time. He appears well-developed and well-nourished.  HENT:  Right Ear: External ear  normal.  Left Ear: External ear normal.  Mouth/Throat: Oropharynx is clear and moist.  Eyes: Pupils are equal, round, and reactive to light.  Neck: Neck supple. No thyromegaly present.  Cardiovascular: Normal rate and regular rhythm.   Pulmonary/Chest: Effort normal and breath sounds normal. No respiratory distress. He has no wheezes. He has no rales.  Musculoskeletal: He exhibits no edema.  Neurological: He is alert and oriented to person,  place, and time.       Assessment:     #1 hypertension-well controlled  #2 dizziness and lightheadedness. He has demonstrated orthostasis today with seated blood pressure 120/64 and standing 92/58    Plan:     -Discontinue amlodipine -Stay well-hydrated -Follow-up in 3 weeks to reassess. If still having orthostatic symptoms at that point consider reduction in losartan  Eulas Post MD [facility]

## 2017-02-28 NOTE — Patient Instructions (Signed)
Your BP went from 120/64 seated to 90/58 standing STOP the Amlodipine Follow up in 3 weeks to reassess.

## 2017-03-03 DIAGNOSIS — M545 Low back pain: Secondary | ICD-10-CM | POA: Diagnosis not present

## 2017-03-03 DIAGNOSIS — M7062 Trochanteric bursitis, left hip: Secondary | ICD-10-CM | POA: Diagnosis not present

## 2017-03-06 DIAGNOSIS — R350 Frequency of micturition: Secondary | ICD-10-CM | POA: Diagnosis not present

## 2017-03-06 DIAGNOSIS — N3941 Urge incontinence: Secondary | ICD-10-CM | POA: Diagnosis not present

## 2017-03-07 DIAGNOSIS — M7062 Trochanteric bursitis, left hip: Secondary | ICD-10-CM | POA: Diagnosis not present

## 2017-03-07 DIAGNOSIS — M545 Low back pain: Secondary | ICD-10-CM | POA: Diagnosis not present

## 2017-03-10 ENCOUNTER — Other Ambulatory Visit: Payer: Self-pay | Admitting: Neurology

## 2017-03-14 DIAGNOSIS — R350 Frequency of micturition: Secondary | ICD-10-CM | POA: Diagnosis not present

## 2017-03-14 DIAGNOSIS — N3941 Urge incontinence: Secondary | ICD-10-CM | POA: Diagnosis not present

## 2017-03-19 DIAGNOSIS — M7062 Trochanteric bursitis, left hip: Secondary | ICD-10-CM | POA: Diagnosis not present

## 2017-03-19 DIAGNOSIS — M545 Low back pain: Secondary | ICD-10-CM | POA: Diagnosis not present

## 2017-03-20 DIAGNOSIS — N3941 Urge incontinence: Secondary | ICD-10-CM | POA: Diagnosis not present

## 2017-03-21 ENCOUNTER — Ambulatory Visit: Payer: PPO | Admitting: Family Medicine

## 2017-03-21 DIAGNOSIS — M7062 Trochanteric bursitis, left hip: Secondary | ICD-10-CM | POA: Diagnosis not present

## 2017-03-21 DIAGNOSIS — M545 Low back pain: Secondary | ICD-10-CM | POA: Diagnosis not present

## 2017-03-24 ENCOUNTER — Ambulatory Visit (INDEPENDENT_AMBULATORY_CARE_PROVIDER_SITE_OTHER): Payer: PPO | Admitting: Family Medicine

## 2017-03-24 ENCOUNTER — Encounter: Payer: Self-pay | Admitting: Family Medicine

## 2017-03-24 VITALS — BP 145/78 | HR 63 | Temp 98.7°F | Wt 149.3 lb

## 2017-03-24 DIAGNOSIS — I10 Essential (primary) hypertension: Secondary | ICD-10-CM | POA: Diagnosis not present

## 2017-03-24 DIAGNOSIS — R1031 Right lower quadrant pain: Secondary | ICD-10-CM

## 2017-03-24 MED ORDER — AMLODIPINE BESYLATE 2.5 MG PO TABS
2.5000 mg | ORAL_TABLET | Freq: Every day | ORAL | 3 refills | Status: DC
Start: 2017-03-24 — End: 2017-12-02

## 2017-03-24 NOTE — Progress Notes (Signed)
Subjective:     Patient ID: [name]., male   DOB: 1946/02/02, 71 y.o.   MRN: [id number]  HPI Patient seen for the following issues  Long-standing history of hypertension. Is on Parkinson's medications with Sinemet. Last visit he had orthostatic change with blood pressure 120/64 seated and 92/58 standing. We discontinue amlodipine and continued losartan. He's had occasional blood pressures at home around 591 systolic. No headaches. No dizziness.Compliant with medications and denies any side effects  Patient concerned about possible recurrent right inguinal hernia. He has had surgery previously. No some slight pain in that region and possibly some fullness. No dysuria. No fever. No adenopathy. No appetite or weight changes.  He's had some chronic low back pain followed by orthopedics. Not improved with physical therapy or epidural injections  Past Medical History:  Diagnosis Date  . Abdominal pain, right lower quadrant   . Abnormality of gait 05/16/2016  . Acute prostatitis   . Anxiety   . Constipation   . Degenerative disc disease   . Depression   . Diverticulosis of colon (without mention of hemorrhage)   . Essential and other specified forms of tremor   . Family history of malignant neoplasm of gastrointestinal tract   . Fibromyalgia   . Ganglion and cyst of synovium, tendon, and bursa   . GERD (gastroesophageal reflux disease)   . Hemarthrosis, upper arm   . Hyperlipidemia   . Hypersomnia with sleep apnea, unspecified   . Hypertension   . Hypopotassemia   . Localized osteoarthrosis not specified whether primary or secondary, lower leg   . Lumbago   . Memory difficulties 04/22/2013  . Nocturia   . Osteoporosis, unspecified   . Other testicular hypofunction   . Parkinson disease ([place]) 05/16/2016  . Right inguinal hernia   . Sleep apnea    last sleep study 11/11 on chart- Bipap with settings of 4 per  patient  . Spinal stenosis, lumbar region, without neurogenic  claudication   . Syncope and collapse   . Unspecified adverse effect of unspecified drug, medicinal and biological substance   . Unspecified gastritis and gastroduodenitis without mention of hemorrhage   . Urinary frequency    Past Surgical History:  Procedure Laterality Date  . BACK SURGERY     x 3  . [place]  . INGUINAL HERNIA REPAIR  07/19/2011   Procedure: LAPAROSCOPIC INGUINAL HERNIA;  Surgeon: Odis Hollingshead, MD;  Location: [facility];  Service: General;  Laterality: Right;  Laparoscopic Repair of Recurrent Right  Ingunial Hernia with Mesh  . microdisectomy  03/02/2003, 06/29/2003, 12/24/2005  . TONSILLECTOMY      reports that he has never smoked. He has never used smokeless tobacco. He reports that he does not drink alcohol or use drugs. family history includes Colon cancer in his mother; Dementia in his mother; Lung cancer in his paternal grandfather; Parkinsonism (age of onset: 58) in his father. Allergies  Allergen Reactions  . Nucynta [Tapentadol] Other (See Comments)    dizziness  . Pregabalin     REACTION: dizziness and mental status changes  . Exelon [Rivastigmine]     abd pain     Review of Systems  Constitutional: Negative for fatigue.  Eyes: Negative for visual disturbance.  Respiratory: Negative for cough, chest tightness and shortness of breath.   Cardiovascular: Negative for chest pain, palpitations and leg swelling.  Gastrointestinal: Negative for abdominal pain.  Genitourinary: Negative for dysuria.  Musculoskeletal: Positive for back  pain.  Neurological: Negative for dizziness, syncope, weakness, light-headedness and headaches.  Hematological: Negative for adenopathy.  Psychiatric/Behavioral: Negative for confusion.       Objective:   Physical Exam  Constitutional: He is oriented to person, place, and time. He appears well-developed and well-nourished.  HENT:  Right Ear: External ear normal.  Left Ear: External ear normal.   Mouth/Throat: Oropharynx is clear and moist.  Eyes: Pupils are equal, round, and reactive to light.  Neck: Neck supple. No thyromegaly present.  Cardiovascular: Normal rate and regular rhythm.   Pulmonary/Chest: Effort normal and breath sounds normal. No respiratory distress. He has no wheezes. He has no rales.  Genitourinary:  Genitourinary Comments: No evidence for right inguinal hernia. No inguinal mass. Testes are slightly atrophic. Nontender.  Musculoskeletal: He exhibits no edema.  Neurological: He is alert and oriented to person, place, and time.       Assessment:     #1 hypertension. Recent orthostasis and after discontinuation of amlodipine blood pressure today seated 160/70 and standing 145/80  #2 right inguinal pain without evidence for recurrent hernia on exam    Plan:     -Add back amlodipine 2.5 mg daily. He had been on 5 mg previously -Change positions gradually -Reassurance that no evidence for recurrent inguinal hernia noted -Continue close follow-up with orthopedist regarding his chronic back difficulties -Follow-up in one month to reassess blood pressure  Eulas Post MD [facility]

## 2017-03-24 NOTE — Patient Instructions (Signed)

## 2017-03-26 DIAGNOSIS — M545 Low back pain: Secondary | ICD-10-CM | POA: Diagnosis not present

## 2017-03-26 DIAGNOSIS — M7062 Trochanteric bursitis, left hip: Secondary | ICD-10-CM | POA: Diagnosis not present

## 2017-03-27 DIAGNOSIS — N3941 Urge incontinence: Secondary | ICD-10-CM | POA: Diagnosis not present

## 2017-03-27 NOTE — Progress Notes (Addendum)
Subjective:   [name]. is a 71 y.o. male who presents for Medicare Annual/Subsequent preventive examination.  The Patient was informed that the wellness visit is to identify future health risk and educate and initiate measures that can reduce risk for increased disease through the lifespan.    Annual Wellness Assessment  Reports health as fair  Preventive Screening -Counseling & Management  Medicare Annual Preventive Care Visit - Subsequent Last OV 03/24/2017  Colonoscopy 01/2013 PSA 02/2014  Describes Health as poor, fair, good or great?  States he light headed all the time  Has parkinson's  Wife passed away x 1yo  2 children; one lives in [place] and one in [place] in [place] Does not have yard maintenance Good neighbors [place]  Not sure he is ready to move Is on the waiting list at retirement center in [place] ( remembered [employer])   Chair in the shower Probably will get a helpline  Memory is intact; reaction time is slower. Difficulty with focus with math etc but recall is good. Reads the paper in the evening and remembers what he read yesterday  No loss of function in the last 6 months Increased neuropathy in toes and now gait is off  Has a right frozen foot; not easily controlled  Has not fallen due to this. Discussed walker but not ready for this yet. Feels it would get in his way  Has PT now and this is helping   MED CMA is helping him with meds  States he cannot read the RX on rx so, will make an eye apt soon Has someone who assists him now 3 hours x 5 days a week   VS reviewed; recent orthostatic BP with change in meds States he is not having any issues,   Diet son cooks for him  Son freezes dinners for him Aid fixes breakfast  Weight stays the same   BMI 22.4  Exercise HDL 76 Does stretching exercises every day  Dental teeth ok    Sleep patterns: urinary freq and up at hs   Pain has some back pain    Pain is a 5 or 6  What relieves it/ oxycodone     Cardiac Risk Factors Addressed Hyperlipidemia -chol.HDL ratio 3 Pre-diabetes A1c 5.9   Advanced Directives - yes completed   Patient Care Team: Eulas Post, MD as PCP - General (Family Medicine)   Immunization History  Administered Date(s) Administered  . Influenza Whole 08/19/2005, 05/09/2008, 05/16/2009, 05/21/2010  . Influenza, High Dose Seasonal PF 05/14/2016  . Influenza,inj,Quad PF,36+ Mos 05/07/2013, 06/14/2014  . Influenza-Unspecified 06/02/2015  . Pneumococcal Polysaccharide-23 03/07/2014  . Td 08/19/2001  . Tdap 03/07/2014  . Zoster 09/24/2013   Required Immunizations needed today  Screening test up to date or reviewed for plan of completion Health Maintenance Due  Topic Date Due  . Hepatitis C Screening  April 19, 1946  . PNA vac Low Risk Adult (2 of 2 - PCV13) 03/08/2015  . COLONOSCOPY  02/03/2016  . INFLUENZA VACCINE  03/19/2017    Will have hep c drawn as he was a [place] vet  Prevnar 13 will take today by CMA   Colonoscopy q 5 years due 01/2016 Will discuss with Dr. Elease Hashimoto  He hesitant to go through prep Discussed cologuard as well    Cardiac Risk Factors include: advanced age (>48men, >63 women);hypertension;male gender     Objective:    Vitals: BP (!) 156/80  Pulse 61   Ht 5\' 8"  (1.727 m)   Wt 147 lb (66.7 kg)   SpO2 97%   BMI 22.35 kg/m   Body mass index is 22.35 kg/m.  Tobacco History  Smoking Status  . Never Smoker  Smokeless Tobacco  . Never Used     Counseling given: Yes   Past Medical History:  Diagnosis Date  . Abdominal pain, right lower quadrant   . Abnormality of gait 05/16/2016  . Acute prostatitis   . Anxiety   . Constipation   . Degenerative disc disease   . Depression   . Diverticulosis of colon (without mention of hemorrhage)   . Essential and other specified forms of tremor   . Family history of malignant neoplasm of gastrointestinal tract   .  Fibromyalgia   . Ganglion and cyst of synovium, tendon, and bursa   . GERD (gastroesophageal reflux disease)   . Hemarthrosis, upper arm   . Hyperlipidemia   . Hypersomnia with sleep apnea, unspecified   . Hypertension   . Hypopotassemia   . Localized osteoarthrosis not specified whether primary or secondary, lower leg   . Lumbago   . Memory difficulties 04/22/2013  . Nocturia   . Osteoporosis, unspecified   . Other testicular hypofunction   . Parkinson disease ([place]) 05/16/2016  . Right inguinal hernia   . Sleep apnea    last sleep study 11/11 on chart- Bipap with settings of 4 per  patient  . Spinal stenosis, lumbar region, without neurogenic claudication   . Syncope and collapse   . Unspecified adverse effect of unspecified drug, medicinal and biological substance   . Unspecified gastritis and gastroduodenitis without mention of hemorrhage   . Urinary frequency    Past Surgical History:  Procedure Laterality Date  . BACK SURGERY     x 3  . [place]  . INGUINAL HERNIA REPAIR  07/19/2011   Procedure: LAPAROSCOPIC INGUINAL HERNIA;  Surgeon: Odis Hollingshead, MD;  Location: [facility];  Service: General;  Laterality: Right;  Laparoscopic Repair of Recurrent Right  Ingunial Hernia with Mesh  . microdisectomy  03/02/2003, 06/29/2003, 12/24/2005  . TONSILLECTOMY     Family History  Problem Relation Age of Onset  . Colon cancer Mother   . Dementia Mother   . Parkinsonism Father 31  . Lung cancer Paternal Grandfather        smoker  . Liver disease Neg Hx   . Kidney disease Neg Hx   . Esophageal cancer Neg Hx    History  Sexual Activity  . Sexual activity: Not on file    Outpatient Encounter Prescriptions as of 03/28/2017  Medication Sig  . amLODipine (NORVASC) 2.5 MG tablet Take 1 tablet (2.5 mg total) by mouth daily.  . Calcium Carbonate-Vitamin D (CALCIUM 600 + D PO) Take 1 tablet by mouth 2 (two) times daily.   . carbidopa-levodopa (SINEMET IR) 25-100 MG  tablet Take 1.5 tablets by mouth 3 (three) times daily.  . cholecalciferol (VITAMIN D) 1000 UNITS tablet Take 5,000 Units by mouth daily.   . citalopram (CELEXA) 40 MG tablet TAKE 1 TABLET BY MOUTH IN THE MORNING  . Coenzyme Q10 200 MG capsule Take 200 mg by mouth daily.   . diclofenac sodium (VOLTAREN) 1 % GEL Apply 2 g topically 4 (four) times daily.  . DULoxetine (CYMBALTA) 30 MG capsule Take 1 capsule (30 mg total) by mouth daily.  Marland Kitchen KLOR-CON M20 20 MEQ tablet TAKE 3  TABLETS (60 MEQ TOTAL) BY MOUTH DAILY.  Marland Kitchen Krill Oil 1000 MG CAPS Take 1 capsule by mouth 2 (two) times daily.  Marland Kitchen losartan (COZAAR) 100 MG tablet TAKE 1 TABLET (100 MG TOTAL) BY MOUTH DAILY.  Marland Kitchen MAGNESIUM CITRATE PO Take 200 mg by mouth 2 (two) times daily.   . memantine (NAMENDA) 10 MG tablet TAKE 1 TABLET (10 MG TOTAL) BY MOUTH 2 (TWO) TIMES DAILY.  . metoprolol tartrate (LOPRESSOR) 25 MG tablet TAKE ONE TABLET ONCE PER DAY.  Marland Kitchen Nutritional Supplements (ANTI-OXIDANT COMPLEX PO) Take 10 mg by mouth daily.   . Probiotic Product (ALIGN) 4 MG CAPS Take by mouth daily.   . vitamin B-12 (CYANOCOBALAMIN) 50 MCG tablet Take 50 mcg by mouth daily.    No facility-administered encounter medications on file as of 03/28/2017.     Activities of Daily Living In your present state of health, do you have any difficulty performing the following activities: 03/28/2017  Hearing? N  Vision? Y  Difficulty concentrating or making decisions? N  Walking or climbing stairs? Y  Dressing or bathing? Y  Comment doing it so far  Doing errands, shopping? N  Preparing Food and eating ? N  Using the Toilet? N  In the past six months, have you accidently leaked urine? N  Do you have problems with loss of bowel control? N  Managing your Medications? Y  Managing your Finances? N  Housekeeping or managing your Housekeeping? N  Some recent data might be hidden    Patient Care Team: Eulas Post, MD as PCP - General (Family Medicine)     Assessment:   Exercise Activities and Dietary recommendations Current Exercise Habits: Home exercise routine, Time (Minutes): 20, Intensity: Mild  Goals    . patient          To stay independent / continue at home      Fall Risk Fall Risk  03/28/2017 05/14/2016 03/28/2015 03/07/2014  Falls in the past year? No No No Yes  Risk for fall due to : - - - Impaired balance/gait   Depression Screen PHQ 2/9 Scores 03/28/2017 05/14/2016 03/28/2015 03/07/2014  PHQ - 2 Score 0 0 0 1    Cognitive Function MMSE - Mini Mental State Exam 01/27/2017 05/16/2016 11/09/2015 05/11/2015 11/03/2014  Orientation to time 4 5 5 5 5   Orientation to Place 5 5 5 5 5   Registration 3 3 3 3 3   Attention/ Calculation 5 5 5 5 5   Recall 3 3 3 3 3   Language- name 2 objects 2 2 2 2 2   Language- repeat 1 1 1 1 1   Language- follow 3 step command 3 3 3 3 3   Language- read & follow direction 1 1 1 1 1   Write a sentence 1 1 1 1 1   Copy design 0 1 1 1 1   Total score 28 30 30 30 30         Immunization History  Administered Date(s) Administered  . Influenza Whole 08/19/2005, 05/09/2008, 05/16/2009, 05/21/2010  . Influenza, High Dose Seasonal PF 05/14/2016  . Influenza,inj,Quad PF,36+ Mos 05/07/2013, 06/14/2014  . Influenza-Unspecified 06/02/2015  . Pneumococcal Polysaccharide-23 03/07/2014  . Td 08/19/2001  . Tdap 03/07/2014  . Zoster 09/24/2013   Screening Tests Health Maintenance  Topic Date Due  . Hepatitis C Screening  1946-03-31  . PNA vac Low Risk Adult (2 of 2 - PCV13) 03/08/2015  . COLONOSCOPY  02/03/2016  . INFLUENZA VACCINE  03/19/2017  . TETANUS/TDAP  03/07/2024  Plan:     PCP Notes   Health Maintenance Agreed to have hepatitis C drawn at his next blood draw. History of serving in [place] with Vit him at risk  Take his Prevnar 13 today Will make an appointment with trauma ad [place] in the near future.  Abnormal Screens  Mini-Mental status exam was 28. Still able to think through  issues and remain independent at present   Colonoscopy was due last year. Due to the prep and other issues with his immobility not sure he wants to pursue this. Discussed: colo-guard and he would like to talk to Dr. Elease Hashimoto about that option  Referrals  No referrals needed  Patient concerns; Difficulty getting up at night to go to the bathroom due to increasing immobility. Suggested bedside commode or a urinal when he will pursue this with his son.  Is a veteran did not have veteran's benefits. Given the address of the [place] office where he can apply.  Nurse Concerns; As noted above  Next PCP apt/next appointment of September with Dr. Tresa Endo noted on the appointment to discussed: Guard with him.     I have personally reviewed and noted the following in the patient's chart:   . Medical and social history . Use of alcohol, tobacco or illicit drugs  . Current medications and supplements . Functional ability and status . Nutritional status . Physical activity . Advanced directives . List of other physicians . Hospitalizations, surgeries, and ER visits in previous 12 months . Vitals . Screenings to include cognitive, depression, and falls . Referrals and appointments  In addition, I have reviewed and discussed with patient certain preventive protocols, quality metrics, and best practice recommendations. A written personalized care plan for preventive services as well as general preventive health recommendations were provided to patient.     EYCXK,GYJEH, RN  03/28/2017  Agree with assessment as above.  Eulas Post MD [facility]

## 2017-03-28 ENCOUNTER — Ambulatory Visit (INDEPENDENT_AMBULATORY_CARE_PROVIDER_SITE_OTHER): Payer: PPO

## 2017-03-28 VITALS — BP 140/80 | HR 61 | Ht 68.0 in | Wt 147.0 lb

## 2017-03-28 DIAGNOSIS — Z0000 Encounter for general adult medical examination without abnormal findings: Secondary | ICD-10-CM | POA: Diagnosis not present

## 2017-03-28 DIAGNOSIS — Z23 Encounter for immunization: Secondary | ICD-10-CM | POA: Diagnosis not present

## 2017-03-28 DIAGNOSIS — Z1159 Encounter for screening for other viral diseases: Secondary | ICD-10-CM | POA: Diagnosis not present

## 2017-03-28 NOTE — Patient Instructions (Addendum)
[name] , Thank you for taking time to come for your Medicare Wellness Visit. I appreciate your ongoing commitment to your health goals. Please review the following plan we discussed and let me know if I can assist you in the future.   Will take your Prevnar 13 today  You may consider either a Bedside commode or a urinal (2)   Will make an apt with [facility]   Will discuss colonoscopy vs colo- guard with Dr Elease Hashimoto Medicare Part B will cover the CologuardTM test once every three years for beneficiaries who meet all of the following criteria:  Age 48 to 28 years,  Asymptomatic (no signs or symptoms of colorectal disease including but not limited to lower gastrointestinal pain, blood in stool, positive guaiac fecal occult blood test or fecal immunochemical test), and  At average risk of developing colorectal cancer (no personal history of adenomatous polyps, colorectal cancer, or inflammatory bowel disease, including Crohn's Disease and ulcerative colitis; no family history of colorectal cancers or adenomatous polyps, familial adenomatous polyposis, or hereditary nonpolyposis colorectal cancer).    Veterans want to access their VA benefits can call [facility]  [address] [place], [place] [address]  Phone: [phone] Or [phone] Fax: [phone]  OR   They can to to the [facility] office and call [phone] and If the prompt starts, hit 0 and then ask for eligibility. They will assist you to sign up for medical benefits.    [place] [facility]; [phone] Can call to ask about helpline available in [place] with your security to see if they have alert as well  Sr. Line; (810)695-6253 Get resource to get information on any and all community programs for [facility]: 386-588-0034 Community Health Response Program -921-194-1740 Public Health Dept; Need to be a skilled visit but can assist with bathing as well;  [phone]  Adult center for Enrichment;  Call Senior Line; ([phone]  Adult day services include Adult Day Care, Adult Day Healthcare, Group Respite, Care Partners, Volunteer In [employer], Education and Support Program   Dept of Social Services; Call [phone] and ask for SW on call  Options for Medicaid include the Community Alternatives program; [place]-[facility]org (personal care services) or PACE program, which is a medical and social program combined  Braulio Conte manages the community Alternatives program at the [employer]; 287-867-6720 (this is a program with a waiting list but provides SNF care at home;  [facility] 2 required; Call Claiborne Billings and she will send out packet of information   Caregiver support group and information regarding [place] is at the; [facility] Address: [address], [place], [place] [address]  Phone: [phone]    These are the goals we discussed: Goals    None      This is a list of the screening recommended for you and due dates:  Health Maintenance  Topic Date Due  .  Hepatitis C: One time screening is recommended by Center for Disease Control  (CDC) for  adults born from [birth year] through 1966.   1945-12-01  . Pneumonia vaccines (2 of 2 - PCV13) 03/08/2015  . Colon Cancer Screening  02/03/2016  . Flu Shot  03/19/2017  . Tetanus Vaccine  03/07/2024   Prevention of falls: Remove rugs or any tripping hazards in the home Use Non slip mats in bathtubs and showers Placing grab bars next to the toilet and or shower Placing handrails on  both sides of the stair way Adding extra lighting in the home.   Personal safety issues reviewed:  1. Consider starting a community watch program per [facility] 2.  Changes batteries is smoke detector and/or carbon monoxide detector  3.  If you have firearms; keep them in a safe place 4.  Wear protection when in the sun; Always wear sunscreen or a hat; It is good to  have your doctor check your skin annually or review any new areas of concern 5. Driving safety; Keep in the right lane; stay 3 car lengths behind the car in front of you on the highway; look 3 times prior to pulling out; carry your cell phone everywhere you go!    Learn about the Yellow Dot program:  The program allows first responders at your emergency to have access to who your physician is, as well as your medications and medical conditions.  Citizens requesting the Yellow Dot Packages should contact Master Corporal Nunzio Cobbs at the [facility] [phone] for the first week of the program and beginning the week after Easter citizens should contact their Scientist, physiological.     Health Maintenance, Male A healthy lifestyle and preventive care is important for your health and wellness. Ask your health care provider about what schedule of regular examinations is right for you. What should I know about weight and diet? Eat a Healthy Diet  Eat plenty of vegetables, fruits, whole grains, low-fat dairy products, and lean protein.  Do not eat a lot of foods high in solid fats, added sugars, or salt.  Maintain a Healthy Weight Regular exercise can help you achieve or maintain a healthy weight. You should:  Do at least 150 minutes of exercise each week. The exercise should increase your heart rate and make you sweat (moderate-intensity exercise).  Do strength-training exercises at least twice a week.  Watch Your Levels of Cholesterol and Blood Lipids  Have your blood tested for lipids and cholesterol every 5 years starting at 72 years of age. If you are at high risk for heart disease, you should start having your blood tested when you are 72 years old. You may need to have your cholesterol levels checked more often if: ? Your lipid or cholesterol levels are high. ? You are older than 72 years of age. ? You are at high risk for heart disease.  What  should I know about cancer screening? Many types of cancers can be detected early and may often be prevented. Lung Cancer  You should be screened every year for lung cancer if: ? You are a current smoker who has smoked for at least 30 years. ? You are a former smoker who has quit within the past 15 years.  Talk to your health care provider about your screening options, when you should start screening, and how often you should be screened.  Colorectal Cancer  Routine colorectal cancer screening usually begins at 72 years of age and should be repeated every 5-10 years until you are 72 years old. You may need to be screened more often if early forms of precancerous polyps or small growths are found. Your health care provider may recommend screening at an earlier age if you have risk factors for colon cancer.  Your health care provider may recommend using home test kits to check for hidden blood in the stool.  A small camera at the end of a tube can be used to examine your colon (  sigmoidoscopy or colonoscopy). This checks for the earliest forms of colorectal cancer.  Prostate and Testicular Cancer  Depending on your age and overall health, your health care provider may do certain tests to screen for prostate and testicular cancer.  Talk to your health care provider about any symptoms or concerns you have about testicular or prostate cancer.  Skin Cancer  Check your skin from head to toe regularly.  Tell your health care provider about any new moles or changes in moles, especially if: ? There is a change in a mole's size, shape, or color. ? You have a mole that is larger than a pencil eraser.  Always use sunscreen. Apply sunscreen liberally and repeat throughout the day.  Protect yourself by wearing long sleeves, pants, a wide-brimmed hat, and sunglasses when outside.  What should I know about heart disease, diabetes, and high blood pressure?  If you are 54-87 years of age, have your  blood pressure checked every 3-5 years. If you are 44 years of age or older, have your blood pressure checked every year. You should have your blood pressure measured twice-once when you are at a hospital or clinic, and once when you are not at a hospital or clinic. Record the average of the two measurements. To check your blood pressure when you are not at a hospital or clinic, you can use: ? An automated blood pressure machine at a pharmacy. ? A home blood pressure monitor.  Talk to your health care provider about your target blood pressure.  If you are between 34-24 years old, ask your health care provider if you should take aspirin to prevent heart disease.  Have regular diabetes screenings by checking your fasting blood sugar level. ? If you are at a normal weight and have a low risk for diabetes, have this test once every three years after the age of 33. ? If you are overweight and have a high risk for diabetes, consider being tested at a younger age or more often.  A one-time screening for abdominal aortic aneurysm (AAA) by ultrasound is recommended for men aged 96-75 years who are current or former smokers. What should I know about preventing infection? Hepatitis B If you have a higher risk for hepatitis B, you should be screened for this virus. Talk with your health care provider to find out if you are at risk for hepatitis B infection. Hepatitis C Blood testing is recommended for:  Everyone born from [birth year] through 1965.  Anyone with known risk factors for hepatitis C.  Sexually Transmitted Diseases (STDs)  You should be screened each year for STDs including gonorrhea and chlamydia if: ? You are sexually active and are younger than 72 years of age. ? You are older than 72 years of age and your health care provider tells you that you are at risk for this type of infection. ? Your sexual activity has changed since you were last screened and you are at an increased risk for chlamydia  or gonorrhea. Ask your health care provider if you are at risk.  Talk with your health care provider about whether you are at high risk of being infected with HIV. Your health care provider may recommend a prescription medicine to help prevent HIV infection.  What else can I do?  Schedule regular health, dental, and eye exams.  Stay current with your vaccines (immunizations).  Do not use any tobacco products, such as cigarettes, chewing tobacco, and e-cigarettes. If you need help quitting, ask  your health care provider.  Limit alcohol intake to no more than 2 drinks per day. One drink equals 12 ounces of beer, 5 ounces of wine, or 1 ounces of hard liquor.  Do not use street drugs.  Do not share needles.  Ask your health care provider for help if you need support or information about quitting drugs.  Tell your health care provider if you often feel depressed.  Tell your health care provider if you have ever been abused or do not feel safe at home. This information is not intended to replace advice given to you by your health care provider. Make sure you discuss any questions you have with your health care provider. Document Released: 02/01/2008 Document Revised: 04/03/2016 Document Reviewed: 05/09/2015 Elsevier Interactive Patient Education  2018 [employer]. Agreed to have hip C drawn at his next blood draw.

## 2017-04-02 DIAGNOSIS — M545 Low back pain: Secondary | ICD-10-CM | POA: Diagnosis not present

## 2017-04-02 DIAGNOSIS — M7062 Trochanteric bursitis, left hip: Secondary | ICD-10-CM | POA: Diagnosis not present

## 2017-04-09 DIAGNOSIS — M545 Low back pain: Secondary | ICD-10-CM | POA: Diagnosis not present

## 2017-04-09 DIAGNOSIS — M7062 Trochanteric bursitis, left hip: Secondary | ICD-10-CM | POA: Diagnosis not present

## 2017-04-10 DIAGNOSIS — N3941 Urge incontinence: Secondary | ICD-10-CM | POA: Diagnosis not present

## 2017-04-11 DIAGNOSIS — M545 Low back pain: Secondary | ICD-10-CM | POA: Diagnosis not present

## 2017-04-11 DIAGNOSIS — M7062 Trochanteric bursitis, left hip: Secondary | ICD-10-CM | POA: Diagnosis not present

## 2017-04-14 DIAGNOSIS — H2513 Age-related nuclear cataract, bilateral: Secondary | ICD-10-CM | POA: Diagnosis not present

## 2017-04-15 ENCOUNTER — Ambulatory Visit: Payer: PPO | Attending: Neurology | Admitting: Physical Therapy

## 2017-04-15 DIAGNOSIS — R2689 Other abnormalities of gait and mobility: Secondary | ICD-10-CM | POA: Insufficient documentation

## 2017-04-15 NOTE — Therapy (Signed)
[facility] [address] [place], [place], [address] Phone: [phone]   Fax:  [phone]  Patient Details  Name: [name]. MRN: [id number] Date of Birth: [birth year] Referring Provider:  Eulas Post, MD  Encounter Date: 04/15/2017   Physical Therapy Parkinson's Disease Screen   Timed Up and Go test:14.91 sec (15.1 sec at d/c)  10 meter walk test:2.77 ft/sec (2.85 sec at previous eval)  5 time sit to stand test:18.43 sec (17.69 sec at d/c)   Patient does not require Physical Therapy services at this time.  Recommend Physical Therapy screen in 6-9 months.  Pt's numbers are largely unchanged since discharge from therapy in February.  However, PT concerned because of patient's reports-see below.  Pt reports near-falls, but no falls.  He reports feeling fair-increased difficulty with gait, blood pressure, dizziness, neuropathy.  He reports not being able to get anything accomplished during a day due to "feeling like his body is shutting down."  Spoke with patient recommending patient earlier follow-up with Dr. Jannifer Franklin (currently, pt has October appointment), given he feels his symptoms are worsening and interfering with daily activities.  Also discussed benefit of Movement Disorders Specialist as a possibility to further address evolving PD symptoms.   MARRIOTT,AMY W. 04/15/2017, 1:46 PM Frazier Butt., PT  [facility] [address] [place], [place], [address] Phone: [phone]   Fax:  [phone]

## 2017-04-16 DIAGNOSIS — M545 Low back pain: Secondary | ICD-10-CM | POA: Diagnosis not present

## 2017-04-16 DIAGNOSIS — M7062 Trochanteric bursitis, left hip: Secondary | ICD-10-CM | POA: Diagnosis not present

## 2017-04-17 DIAGNOSIS — N3941 Urge incontinence: Secondary | ICD-10-CM | POA: Diagnosis not present

## 2017-04-18 ENCOUNTER — Other Ambulatory Visit: Payer: Self-pay | Admitting: Family Medicine

## 2017-04-18 DIAGNOSIS — M545 Low back pain: Secondary | ICD-10-CM | POA: Diagnosis not present

## 2017-04-18 DIAGNOSIS — M7062 Trochanteric bursitis, left hip: Secondary | ICD-10-CM | POA: Diagnosis not present

## 2017-04-23 DIAGNOSIS — M7062 Trochanteric bursitis, left hip: Secondary | ICD-10-CM | POA: Diagnosis not present

## 2017-04-23 DIAGNOSIS — M545 Low back pain: Secondary | ICD-10-CM | POA: Diagnosis not present

## 2017-04-24 DIAGNOSIS — R350 Frequency of micturition: Secondary | ICD-10-CM | POA: Diagnosis not present

## 2017-04-24 DIAGNOSIS — N3941 Urge incontinence: Secondary | ICD-10-CM | POA: Diagnosis not present

## 2017-04-25 ENCOUNTER — Ambulatory Visit (INDEPENDENT_AMBULATORY_CARE_PROVIDER_SITE_OTHER): Payer: PPO | Admitting: Family Medicine

## 2017-04-25 ENCOUNTER — Encounter: Payer: Self-pay | Admitting: Family Medicine

## 2017-04-25 VITALS — BP 138/78 | HR 67 | Temp 98.0°F | Wt 148.6 lb

## 2017-04-25 DIAGNOSIS — Z23 Encounter for immunization: Secondary | ICD-10-CM | POA: Diagnosis not present

## 2017-04-25 DIAGNOSIS — K5900 Constipation, unspecified: Secondary | ICD-10-CM | POA: Diagnosis not present

## 2017-04-25 DIAGNOSIS — D126 Benign neoplasm of colon, unspecified: Secondary | ICD-10-CM | POA: Diagnosis not present

## 2017-04-25 DIAGNOSIS — I10 Essential (primary) hypertension: Secondary | ICD-10-CM | POA: Diagnosis not present

## 2017-04-25 NOTE — Progress Notes (Signed)
Subjective:     Patient ID: [name]., male   DOB: [birth year], 71 y.o.   MRN: [id number]  HPI Patient seen to discuss multiple items. He has history of Parkinson's, hypertension, history of adenomatous polyp  He had questions regarding doing DNA base stool testing versus repeat colonoscopy. In looking back, he had colonoscopy 02/02/13 with adenomatous polyp and somewhat suboptimal prep with recommended 3 year follow-up. He never went. We explained that he is not a candidate for cologuard because of issues above. He's not any recent stool changes other than some chronic constipation. Currently taking Senokot for that. TSH last years normal. Apparently eats lots of fiber and trying to work on fluid intake. Does not get a lot of activity.  Blood pressures been elevated recently by home readings. We had reduced his amlodipine from 5 to 2.5 mg because some orthostasis. He is not having orthostatic symptoms now. He remains on Sinemet. Also remains on losartan.  He has some chronic back pain and has been on Cymbalta 30 mg daily. He has not seen any improvement in his pain with the Cymbalta.  Past Medical History:  Diagnosis Date  . Abdominal pain, right lower quadrant   . Abnormality of gait 05/16/2016  . Acute prostatitis   . Anxiety   . Constipation   . Degenerative disc disease   . Depression   . Diverticulosis of colon (without mention of hemorrhage)   . Essential and other specified forms of tremor   . Family history of malignant neoplasm of gastrointestinal tract   . Fibromyalgia   . Ganglion and cyst of synovium, tendon, and bursa   . GERD (gastroesophageal reflux disease)   . Hemarthrosis, upper arm   . Hyperlipidemia   . Hypersomnia with sleep apnea, unspecified   . Hypertension   . Hypopotassemia   . Localized osteoarthrosis not specified whether primary or secondary, lower leg   . Lumbago   . Memory difficulties 04/22/2013  . Nocturia   . Osteoporosis, unspecified   . Other  testicular hypofunction   . Parkinson disease ([place]) 05/16/2016  . Right inguinal hernia   . Sleep apnea    last sleep study 11/11 on chart- Bipap with settings of 4 per  patient  . Spinal stenosis, lumbar region, without neurogenic claudication   . Syncope and collapse   . Unspecified adverse effect of unspecified drug, medicinal and biological substance   . Unspecified gastritis and gastroduodenitis without mention of hemorrhage   . Urinary frequency    Past Surgical History:  Procedure Laterality Date  . BACK SURGERY     x 3  . [place]  . INGUINAL HERNIA REPAIR  07/19/2011   Procedure: LAPAROSCOPIC INGUINAL HERNIA;  Surgeon: Odis Hollingshead, MD;  Location: [facility];  Service: General;  Laterality: Right;  Laparoscopic Repair of Recurrent Right  Ingunial Hernia with Mesh  . microdisectomy  03/02/2003, 06/29/2003, 12/24/2005  . TONSILLECTOMY      reports that he has never smoked. He has never used smokeless tobacco. He reports that he does not drink alcohol or use drugs. family history includes Colon cancer in his mother; Dementia in his mother; Lung cancer in his paternal grandfather; Parkinsonism (age of onset: 22) in his father. Allergies  Allergen Reactions  . Nucynta [Tapentadol] Other (See Comments)    dizziness  . Pregabalin     REACTION: dizziness and mental status changes  . Exelon [Rivastigmine]     abd pain  Review of Systems  Constitutional: Negative for fatigue.  Eyes: Negative for visual disturbance.  Respiratory: Negative for cough, chest tightness and shortness of breath.   Cardiovascular: Negative for chest pain, palpitations and leg swelling.  Gastrointestinal: Positive for constipation. Negative for abdominal pain.  Neurological: Negative for dizziness, syncope, weakness, light-headedness and headaches.       Objective:   Physical Exam  Constitutional: He is oriented to person, place, and time. He appears well-developed and  well-nourished.  HENT:  Right Ear: External ear normal.  Left Ear: External ear normal.  Mouth/Throat: Oropharynx is clear and moist.  Eyes: Pupils are equal, round, and reactive to light.  Neck: Neck supple. No thyromegaly present.  Cardiovascular: Normal rate and regular rhythm.   Pulmonary/Chest: Effort normal and breath sounds normal. No respiratory distress. He has no wheezes. He has no rales.  Musculoskeletal: He exhibits no edema.  Neurological: He is alert and oriented to person, place, and time.       Assessment:     #1 hypertension. Stable by reading today with repeat left arm standing 138/78 but elevated readings at home.  #2 history of adenomatous colon polyp. Patient is not a candidate for cologuard  #3 constipation    Plan:     -Discussed lifestyle measures to help with constipation. -Avoid regular use of stimulant laxatives. Consider use of MiraLAX as needed if not improved with stool softeners -Continue to monitor blood pressure. Consider new home monitor. Be in touch if consistently greater than 140/90 -Consider titrating Cymbalta to 60 mg once daily. If no improvement 3 weeks and taper off  Eulas Post MD  Primary Care at [facility]

## 2017-04-25 NOTE — Patient Instructions (Addendum)
Constipation, Adult Constipation is when a person has fewer bowel movements in a week than normal, has difficulty having a bowel movement, or has stools that are dry, hard, or larger than normal. Constipation may be caused by an underlying condition. It may become worse with age if a person takes certain medicines and does not take in enough fluids. Follow these instructions at home: Eating and drinking   Eat foods that have a lot of fiber, such as fresh fruits and vegetables, whole grains, and beans.  Limit foods that are high in fat, low in fiber, or overly processed, such as french fries, hamburgers, cookies, candies, and soda.  Drink enough fluid to keep your urine clear or pale yellow. General instructions  Exercise regularly or as told by your health care provider.  Go to the restroom when you have the urge to go. Do not hold it in.  Take over-the-counter and prescription medicines only as told by your health care provider. These include any fiber supplements.  Practice pelvic floor retraining exercises, such as deep breathing while relaxing the lower abdomen and pelvic floor relaxation during bowel movements.  Watch your condition for any changes.  Keep all follow-up visits as told by your health care provider. This is important. Contact a health care provider if:  You have pain that gets worse.  You have a fever.  You do not have a bowel movement after 4 days.  You vomit.  You are not hungry.  You lose weight.  You are bleeding from the anus.  You have thin, pencil-like stools. Get help right away if:  You have a fever and your symptoms suddenly get worse.  You leak stool or have blood in your stool.  Your abdomen is bloated.  You have severe pain in your abdomen.  You feel dizzy or you faint. This information is not intended to replace advice given to you by your health care provider. Make sure you discuss any questions you have with your health care  provider. Document Released: 05/03/2004 Document Revised: 02/23/2016 Document Reviewed: 01/24/2016 Elsevier Interactive Patient Education  2017 [employer].  May use Miralax once daily as needed for constipation.   Consider increase the Cymbalta to two daily and if pain no better in 2-3 weeks drop back down.  Consider repeat colonoscopy.  You are not a candidate for Cologuard because of hx of colon polyps.

## 2017-05-01 DIAGNOSIS — N3941 Urge incontinence: Secondary | ICD-10-CM | POA: Diagnosis not present

## 2017-05-01 DIAGNOSIS — R350 Frequency of micturition: Secondary | ICD-10-CM | POA: Diagnosis not present

## 2017-05-05 ENCOUNTER — Telehealth: Payer: Self-pay | Admitting: Family Medicine

## 2017-05-05 NOTE — Telephone Encounter (Signed)
I would have him drop back to 30 mg for 2 weeks and then d/c Cymbalta since no improvement.

## 2017-05-05 NOTE — Telephone Encounter (Signed)
Verbal orders given to care giver.

## 2017-05-05 NOTE — Telephone Encounter (Signed)
° ° °  [place] pt care giver call to say pt said he can not tell any difference with the increase to  60 mg  day of the below med    DULoxetine (CYMBALTA) 30 MG capsule   [phone]

## 2017-05-08 DIAGNOSIS — N3941 Urge incontinence: Secondary | ICD-10-CM | POA: Diagnosis not present

## 2017-05-08 DIAGNOSIS — R350 Frequency of micturition: Secondary | ICD-10-CM | POA: Diagnosis not present

## 2017-05-15 DIAGNOSIS — R350 Frequency of micturition: Secondary | ICD-10-CM | POA: Diagnosis not present

## 2017-05-15 DIAGNOSIS — N3941 Urge incontinence: Secondary | ICD-10-CM | POA: Diagnosis not present

## 2017-05-22 DIAGNOSIS — N3941 Urge incontinence: Secondary | ICD-10-CM | POA: Diagnosis not present

## 2017-05-22 DIAGNOSIS — R350 Frequency of micturition: Secondary | ICD-10-CM | POA: Diagnosis not present

## 2017-05-29 DIAGNOSIS — R350 Frequency of micturition: Secondary | ICD-10-CM | POA: Diagnosis not present

## 2017-05-29 DIAGNOSIS — N3941 Urge incontinence: Secondary | ICD-10-CM | POA: Diagnosis not present

## 2017-06-03 ENCOUNTER — Ambulatory Visit (INDEPENDENT_AMBULATORY_CARE_PROVIDER_SITE_OTHER): Payer: PPO | Admitting: Neurology

## 2017-06-03 ENCOUNTER — Encounter: Payer: Self-pay | Admitting: Neurology

## 2017-06-03 VITALS — BP 134/74 | HR 49 | Ht 68.0 in | Wt 149.0 lb

## 2017-06-03 DIAGNOSIS — R269 Unspecified abnormalities of gait and mobility: Secondary | ICD-10-CM | POA: Diagnosis not present

## 2017-06-03 DIAGNOSIS — G20 Parkinson's disease: Secondary | ICD-10-CM

## 2017-06-03 DIAGNOSIS — R413 Other amnesia: Secondary | ICD-10-CM

## 2017-06-03 MED ORDER — TRAMADOL HCL 50 MG PO TABS: 50.0000 mg | ORAL_TABLET | Freq: Four times a day (QID) | ORAL | 3 refills | Status: DC | PRN

## 2017-06-03 NOTE — Progress Notes (Signed)
Reason for visit: Parkinson's disease  [name] Walz. is an 71 y.o. male  History of present illness:  [name] is a 71 year old right-handed white male with a history of Parkinson's disease. The patient does have some gait instability, he has not had any recent falls. He indicates that his major disability currently is his low back pain. This prevents him from doing much of any activity. He has pain all the time. He has been followed by Dr. Hardin Negus in the past, but he is not actively followed through a pain center currently. He has been on various medications including gabapentin, Cymbalta, Ultram, and oxycodone for his low back. He has been tried on a spinal stimulator, none of the modalities above have been beneficial for his back. He has received epidural steroid injections as well. He has had several lumbar spine surgeries previously. The patient denies any problems with chewing or swallowing. He has some incontinence of urine at night, he wears adult diapers, he may have to get up twice to change his diaper. The patient has chronic constipation issues. He does have some difficulty with standing up, he does have orthostatic hypotension. He also reports a mild memory problems, he is on Namenda for this. He returns for an evaluation.  Past Medical History:  Diagnosis Date  . Abdominal pain, right lower quadrant   . Abnormality of gait 05/16/2016  . Acute prostatitis   . Anxiety   . Constipation   . Degenerative disc disease   . Depression   . Diverticulosis of colon (without mention of hemorrhage)   . Essential and other specified forms of tremor   . Family history of malignant neoplasm of gastrointestinal tract   . Fibromyalgia   . Ganglion and cyst of synovium, tendon, and bursa   . GERD (gastroesophageal reflux disease)   . Hemarthrosis, upper arm   . Hyperlipidemia   . Hypersomnia with sleep apnea, unspecified   . Hypertension   . Hypopotassemia   . Localized  osteoarthrosis not specified whether primary or secondary, lower leg   . Lumbago   . Memory difficulties 04/22/2013  . Nocturia   . Osteoporosis, unspecified   . Other testicular hypofunction   . Parkinson disease ([place]) 05/16/2016  . Right inguinal hernia   . Sleep apnea    last sleep study 11/11 on chart- Bipap with settings of 4 per  patient  . Spinal stenosis, lumbar region, without neurogenic claudication   . Syncope and collapse   . Unspecified adverse effect of unspecified drug, medicinal and biological substance   . Unspecified gastritis and gastroduodenitis without mention of hemorrhage   . Urinary frequency     Past Surgical History:  Procedure Laterality Date  . BACK SURGERY     x 3  . [place]  . INGUINAL HERNIA REPAIR  07/19/2011   Procedure: LAPAROSCOPIC INGUINAL HERNIA;  Surgeon: Odis Hollingshead, MD;  Location: [facility];  Service: General;  Laterality: Right;  Laparoscopic Repair of Recurrent Right  Ingunial Hernia with Mesh  . microdisectomy  03/02/2003, 06/29/2003, 12/24/2005  . TONSILLECTOMY      Family History  Problem Relation Age of Onset  . Colon cancer Mother   . Dementia Mother   . Parkinsonism Father 17  . Lung cancer Paternal Grandfather        smoker  . Liver disease Neg Hx   . Kidney disease Neg Hx   . Esophageal cancer Neg Hx  Social history:  reports that he has never smoked. He has never used smokeless tobacco. He reports that he does not drink alcohol or use drugs.    Allergies  Allergen Reactions  . Nucynta [Tapentadol] Other (See Comments)    dizziness  . Pregabalin     REACTION: dizziness and mental status changes  . Exelon [Rivastigmine]     abd pain    Medications:  Prior to Admission medications   Medication Sig Start Date End Date Taking? Authorizing Provider  amLODipine (NORVASC) 2.5 MG tablet Take 1 tablet (2.5 mg total) by mouth daily. 03/24/17  Yes Burchette, Alinda Sierras, MD  Calcium Carbonate-Vitamin D  (CALCIUM 600 + D PO) Take 1 tablet by mouth 2 (two) times daily.    Yes [provider]  carbidopa-levodopa (SINEMET IR) 25-100 MG tablet Take 1.5 tablets by mouth 3 (three) times daily. 01/27/17  Yes Kathrynn Ducking, MD  cholecalciferol (VITAMIN D) 1000 UNITS tablet Take 5,000 Units by mouth daily.    Yes [provider]  citalopram (CELEXA) 40 MG tablet TAKE 1 TABLET BY MOUTH IN THE MORNING 04/18/17  Yes Burchette, Alinda Sierras, MD  Coenzyme Q10 200 MG capsule Take 200 mg by mouth daily.    Yes [provider]  diclofenac sodium (VOLTAREN) 1 % GEL Apply 2 g topically 4 (four) times daily. 12/04/16  Yes Mcarthur Rossetti, MD  KLOR-CON M20 20 MEQ tablet TAKE 3 TABLETS (60 MEQ TOTAL) BY MOUTH DAILY. 02/10/17  Yes Burchette, Alinda Sierras, MD  Krill Oil 1000 MG CAPS Take 1 capsule by mouth 2 (two) times daily.   Yes [provider]  losartan (COZAAR) 100 MG tablet TAKE 1 TABLET (100 MG TOTAL) BY MOUTH DAILY. 11/07/16  Yes Burchette, Alinda Sierras, MD  MAGNESIUM CITRATE PO Take 200 mg by mouth 2 (two) times daily.    Yes [provider]  memantine (NAMENDA) 10 MG tablet TAKE 1 TABLET (10 MG TOTAL) BY MOUTH 2 (TWO) TIMES DAILY. 11/07/16  Yes Kathrynn Ducking, MD  metoprolol tartrate (LOPRESSOR) 25 MG tablet TAKE ONE TABLET ONCE PER DAY. 12/30/16  Yes Burchette, Alinda Sierras, MD  Nutritional Supplements (ANTI-OXIDANT COMPLEX PO) Take 10 mg by mouth daily.    Yes [provider]  Probiotic Product (ALIGN) 4 MG CAPS Take by mouth daily.    Yes [provider]  vitamin B-12 (CYANOCOBALAMIN) 50 MCG tablet Take 50 mcg by mouth daily.    Yes [provider]    ROS:  Out of a complete 14 system review of symptoms, the patient complains only of the following symptoms, and all other reviewed systems are negative.  Decreased activity Runny nose Abdominal pain, constipation, diarrhea Daytime sleepiness Incontinence of the bladder, frequency of urination,  urinary urgency Joint pain, back pain, walking difficulty Bruising easily Dizziness, numbness, weakness Decreased concentration  Blood pressure 134/74, pulse (!) 49, height 5\' 8"  (1.727 m), weight 149 lb (67.6 kg).   Blood pressure, right arm, standing is 118/80. Blood pressure, right arm, sitting is 164/86.  Physical Exam  General: The patient is alert and cooperative at the time of the examination.  Skin: No significant peripheral edema is noted.   Neurologic Exam  Mental status: The patient is alert and oriented x 3 at the time of the examination. The patient has apparent normal recent and remote memory, with an apparently normal attention span and concentration ability. Mini-Mental Status Examination done today shows a total score of 29/30.  Cranial  nerves: Facial symmetry is present. Speech is normal, no aphasia or dysarthria is noted. Extraocular movements are full. Visual fields are full. Masking of the face is seen.  Motor: The patient has good strength in all 4 extremities.  Sensory examination: Soft touch sensation is symmetric on the face, arms, and legs.  Coordination: The patient has good finger-nose-finger and heel-to-shin bilaterally.  Gait and station: The patient has some difficulty arising from a seated position. Once up, he can walk independently but he has some instability with turns. Decreased arm swing seen bilaterally, no tremors seen. Tandem gait was not attempted. Romberg is negative.  Reflexes: Deep tendon reflexes are symmetric.   Assessment/Plan:  1. Parkinson's disease  2. Gait disorder  3. Orthostatic hypotension  4. Chronic constipation  5. Chronic low back pain  6. Memory disturbance  The patient has multiple issues. The patient will remain on Namenda. He will be given Ultram to take if needed for the back pain. The patient will remain on Sinemet taking 1.5 of the 25/100 mg Sinemet tablets 3 times daily. He does have orthostatic  hypotension, but he does not have severe blood pressure drops. I have recommended he uses a cane for ambulation. I have recommended that he stay on a regular bowel regimen to use MiraLAX daily. I have recommended that he get into an exercise program to include water aerobics. He will follow-up in 4 or 5 months.  Jill Alexanders MD 06/03/2017 12:07 PM  [facility] [address] [place], [place] [address]  Phone [phone] Fax [phone]

## 2017-06-14 ENCOUNTER — Other Ambulatory Visit: Payer: Self-pay | Admitting: Neurology

## 2017-07-11 ENCOUNTER — Other Ambulatory Visit: Payer: Self-pay | Admitting: Family Medicine

## 2017-07-16 ENCOUNTER — Other Ambulatory Visit: Payer: Self-pay | Admitting: Family Medicine

## 2017-07-23 ENCOUNTER — Ambulatory Visit: Payer: PPO | Admitting: Neurology

## 2017-07-23 ENCOUNTER — Encounter: Payer: Self-pay | Admitting: Neurology

## 2017-07-23 VITALS — BP 159/79 | HR 49 | Ht 68.0 in | Wt 152.0 lb

## 2017-07-23 DIAGNOSIS — N289 Disorder of kidney and ureter, unspecified: Secondary | ICD-10-CM | POA: Diagnosis not present

## 2017-07-23 DIAGNOSIS — N3941 Urge incontinence: Secondary | ICD-10-CM | POA: Diagnosis not present

## 2017-07-23 DIAGNOSIS — G20 Parkinson's disease: Secondary | ICD-10-CM | POA: Diagnosis not present

## 2017-07-23 DIAGNOSIS — R351 Nocturia: Secondary | ICD-10-CM | POA: Diagnosis not present

## 2017-07-23 DIAGNOSIS — R350 Frequency of micturition: Secondary | ICD-10-CM | POA: Diagnosis not present

## 2017-07-23 MED ORDER — CARBIDOPA-LEVODOPA 25-100 MG PO TABS: 1.5000 | ORAL_TABLET | Freq: Three times a day (TID) | ORAL | 1 refills | Status: DC

## 2017-07-23 NOTE — Progress Notes (Signed)
Reason for visit: Parkinson's disease  [name] Sokolowski. is an 71 y.o. male  History of present illness:  [name] is a 71 year old right-handed white male with a history of Parkinson's disease.  The patient is on Sinemet and he tolerates medication relatively well.  He does have some generalized fatigue and dizziness and slight drowsiness during the day.  The patient is mainly affected by his low back pain, he has pain into the hip levels bilaterally and has increased pain with weightbearing.  The patient finds that the back pain limits his ability to perform physical activity.  The patient is also seeing a urologist for some bladder control issues and urinary urgency.  The patient does not sleep well at night at times because of the back pain.  He does report some slight troubles with short-term memory.  He denies any significant problems with freezing, he may sometimes hesitate when he first starts to walk.  He denies any falls.  He has tried using a cane for ambulation, but this seems to trip him up when he tries to use it.  Past Medical History:  Diagnosis Date  . Abdominal pain, right lower quadrant   . Abnormality of gait 05/16/2016  . Acute prostatitis   . Anxiety   . Constipation   . Degenerative disc disease   . Depression   . Diverticulosis of colon (without mention of hemorrhage)   . Essential and other specified forms of tremor   . Family history of malignant neoplasm of gastrointestinal tract   . Fibromyalgia   . Ganglion and cyst of synovium, tendon, and bursa   . GERD (gastroesophageal reflux disease)   . Hemarthrosis, upper arm   . Hyperlipidemia   . Hypersomnia with sleep apnea, unspecified   . Hypertension   . Hypopotassemia   . Localized osteoarthrosis not specified whether primary or secondary, lower leg   . Lumbago   . Memory difficulties 04/22/2013  . Nocturia   . Osteoporosis, unspecified   . Other testicular hypofunction   . Parkinson disease ([place])  05/16/2016  . Right inguinal hernia   . Sleep apnea    last sleep study 11/11 on chart- Bipap with settings of 4 per  patient  . Spinal stenosis, lumbar region, without neurogenic claudication   . Syncope and collapse   . Unspecified adverse effect of unspecified drug, medicinal and biological substance   . Unspecified gastritis and gastroduodenitis without mention of hemorrhage   . Urinary frequency     Past Surgical History:  Procedure Laterality Date  . BACK SURGERY     x 3  . [place]  . INGUINAL HERNIA REPAIR  07/19/2011   Procedure: LAPAROSCOPIC INGUINAL HERNIA;  Surgeon: Odis Hollingshead, MD;  Location: [facility];  Service: General;  Laterality: Right;  Laparoscopic Repair of Recurrent Right  Ingunial Hernia with Mesh  . microdisectomy  03/02/2003, 06/29/2003, 12/24/2005  . TONSILLECTOMY      Family History  Problem Relation Age of Onset  . Colon cancer Mother   . Dementia Mother   . Parkinsonism Father 31  . Lung cancer Paternal Grandfather        smoker  . Liver disease Neg Hx   . Kidney disease Neg Hx   . Esophageal cancer Neg Hx     Social history:  reports that  has never smoked. he has never used smokeless tobacco. He reports that he does not drink alcohol or use drugs.  Allergies  Allergen Reactions  . Nucynta [Tapentadol] Other (See Comments)    dizziness  . Pregabalin     REACTION: dizziness and mental status changes  . Exelon [Rivastigmine]     abd pain    Medications:  Prior to Admission medications   Medication Sig Start Date End Date Taking? Authorizing Provider  amLODipine (NORVASC) 2.5 MG tablet Take 1 tablet (2.5 mg total) by mouth daily. 03/24/17  Yes Burchette, Alinda Sierras, MD  Calcium Carbonate-Vitamin D (CALCIUM 600 + D PO) Take 1 tablet by mouth 2 (two) times daily.    Yes [provider]  carbidopa-levodopa (SINEMET IR) 25-100 MG tablet Take 1.5 tablets by mouth 3 (three) times daily. 01/27/17  Yes Kathrynn Ducking, MD    cholecalciferol (VITAMIN D) 1000 UNITS tablet Take 5,000 Units by mouth daily.    Yes [provider]  citalopram (CELEXA) 40 MG tablet TAKE 1 TABLET BY MOUTH IN THE MORNING 07/16/17  Yes Burchette, Alinda Sierras, MD  Coenzyme Q10 200 MG capsule Take 200 mg by mouth daily.    Yes [provider]  diclofenac sodium (VOLTAREN) 1 % GEL Apply 2 g topically 4 (four) times daily. 12/04/16  Yes Mcarthur Rossetti, MD  KLOR-CON M20 20 MEQ tablet TAKE 3 TABLETS (60 MEQ TOTAL) BY MOUTH DAILY. 02/10/17  Yes Burchette, Alinda Sierras, MD  Krill Oil 1000 MG CAPS Take 1 capsule by mouth 2 (two) times daily.   Yes [provider]  losartan (COZAAR) 100 MG tablet TAKE 1 TABLET (100 MG TOTAL) BY MOUTH DAILY. 11/07/16  Yes Burchette, Alinda Sierras, MD  MAGNESIUM CITRATE PO Take 200 mg by mouth 2 (two) times daily.    Yes [provider]  memantine (NAMENDA) 10 MG tablet TAKE 1 TABLET (10 MG TOTAL) BY MOUTH 2 (TWO) TIMES DAILY. 11/07/16  Yes Kathrynn Ducking, MD  metoprolol tartrate (LOPRESSOR) 25 MG tablet TAKE ONE TABLET ONCE PER DAY. 07/14/17  Yes Burchette, Alinda Sierras, MD  Nutritional Supplements (ANTI-OXIDANT COMPLEX PO) Take 10 mg by mouth daily.    Yes [provider]  Probiotic Product (ALIGN) 4 MG CAPS Take by mouth daily.    Yes [provider]  traMADol (ULTRAM) 50 MG tablet Take 1 tablet (50 mg total) by mouth every 6 (six) hours as needed. 06/03/17  Yes Kathrynn Ducking, MD  vitamin B-12 (CYANOCOBALAMIN) 50 MCG tablet Take 50 mcg by mouth daily.    Yes [provider]    ROS:  Out of a complete 14 system review of symptoms, the patient complains only of the following symptoms, and all other reviewed systems are negative.  Fatigue Abdominal pain Incontinence of the bladder, frequency of urination Back pain, walking difficulty Memory loss, dizziness, numbness Hallucinations, decreased concentration, confusion  Blood pressure (!) 159/79, pulse (!) 49,  height 5\' 8"  (1.727 m), weight 152 lb (68.9 kg).  Physical Exam  General: The patient is alert and cooperative at the time of the examination.  Skin: No significant peripheral edema is noted.   Neurologic Exam  Mental status: The patient is alert and oriented x 3 at the time of the examination. The patient has apparent normal recent and remote memory, with an apparently normal attention span and concentration ability.   Cranial nerves: Facial symmetry is present. Speech is normal, no aphasia or dysarthria is noted. Extraocular movements are full. Visual fields are full.  Mild masking of the face is seen.  Motor: The patient has good  strength in all 4 extremities.  Sensory examination: Soft touch sensation is symmetric on the face, arms, and legs.  Coordination: The patient has good finger-nose-finger and heel-to-shin bilaterally.  Gait and station: The patient is able to arise from a seated position with arms crossed.  Once up, the patient is a slightly stooped posture, he is able to walk with good stride and slight hesitation with turns.  Decreased arm swing is seen bilaterally, no tremors are seen.  The patient is able to perform tandem gait.  Romberg is negative.  Reflexes: Deep tendon reflexes are symmetric.   Assessment/Plan:  1.  Parkinson's disease  2.  Chronic low back pain  3.  Memory disturbance  The patient has good mobility, he can actually walk with tandem gait without falling.  I do not think that the dose of the Sinemet needs to be increased.  The Ultram has offered little for pain benefit with his back.  He was seen by Dr. Hardin Negus previously, he stopped seeing Dr. Hardin Negus because he was now out of network.  The patient can find a pain center in that is in network, I will be happy to refer him for the low back pain.  The patient was given a prescription for a rolling walker to see if this helps the back pain with ambulation, the patient needs to get into other  exercise modalities such as water aerobics that may be performed without stress on the back.  He will follow-up in 4 months.  A prescription was sent in for Sinemet.  He will continue the Namenda.  Jill Alexanders MD 07/23/2017 2:13 PM  [facility] [address] [place], [place] [address]  Phone [phone] Fax [phone]

## 2017-07-24 ENCOUNTER — Other Ambulatory Visit: Payer: Self-pay | Admitting: Family Medicine

## 2017-07-25 ENCOUNTER — Encounter: Payer: Self-pay | Admitting: Family Medicine

## 2017-07-25 ENCOUNTER — Ambulatory Visit: Payer: PPO | Admitting: Family Medicine

## 2017-07-25 VITALS — BP 120/70 | HR 58 | Temp 97.7°F | Wt 153.8 lb

## 2017-07-25 DIAGNOSIS — I10 Essential (primary) hypertension: Secondary | ICD-10-CM | POA: Diagnosis not present

## 2017-07-25 DIAGNOSIS — K219 Gastro-esophageal reflux disease without esophagitis: Secondary | ICD-10-CM | POA: Diagnosis not present

## 2017-07-25 DIAGNOSIS — L570 Actinic keratosis: Secondary | ICD-10-CM | POA: Diagnosis not present

## 2017-07-25 NOTE — Progress Notes (Signed)
Subjective:     Patient ID: [name] Brooking., male   DOB: 11/11/1945, 71 y.o.   MRN: [id number]  HPI Patient seen for medical follow-up. He has history of Parkinson's disease followed by neurology. He also has history of hypertension, chronic low back pain, sleep apnea  Blood pressure treated with losartan and low-dose amlodipine. Blood pressures been very stable by home readings. Denies any orthostatic symptoms recently. Compliant with therapy. No recent chest pains. No peripheral edema.  Increased GERD symptoms recently at night usually after supper. No dysphagia. He had prior history of GERD and at one point was taking Protonix. He has recently taken some TUMS which help temporarily. No appetite or weight change  He has scaly lesions dorsum left hand and left face. Present for several weeks and slowly growing. Nonpainful  Past Medical History:  Diagnosis Date  . Abdominal pain, right lower quadrant   . Abnormality of gait 05/16/2016  . Acute prostatitis   . Anxiety   . Constipation   . Degenerative disc disease   . Depression   . Diverticulosis of colon (without mention of hemorrhage)   . Essential and other specified forms of tremor   . Family history of malignant neoplasm of gastrointestinal tract   . Fibromyalgia   . Ganglion and cyst of synovium, tendon, and bursa   . GERD (gastroesophageal reflux disease)   . Hemarthrosis, upper arm   . Hyperlipidemia   . Hypersomnia with sleep apnea, unspecified   . Hypertension   . Hypopotassemia   . Localized osteoarthrosis not specified whether primary or secondary, lower leg   . Lumbago   . Memory difficulties 04/22/2013  . Nocturia   . Osteoporosis, unspecified   . Other testicular hypofunction   . Parkinson disease ([place]) 05/16/2016  . Right inguinal hernia   . Sleep apnea    last sleep study 11/11 on chart- Bipap with settings of 4 per  patient  . Spinal stenosis, lumbar region, without neurogenic claudication   . Syncope and  collapse   . Unspecified adverse effect of unspecified drug, medicinal and biological substance   . Unspecified gastritis and gastroduodenitis without mention of hemorrhage   . Urinary frequency    Past Surgical History:  Procedure Laterality Date  . BACK SURGERY     x 3  . [place]  . INGUINAL HERNIA REPAIR  07/19/2011   Procedure: LAPAROSCOPIC INGUINAL HERNIA;  Surgeon: Odis Hollingshead, MD;  Location: [facility];  Service: General;  Laterality: Right;  Laparoscopic Repair of Recurrent Right  Ingunial Hernia with Mesh  . microdisectomy  03/02/2003, 06/29/2003, 12/24/2005  . TONSILLECTOMY      reports that  has never smoked. he has never used smokeless tobacco. He reports that he does not drink alcohol or use drugs. family history includes Colon cancer in his mother; Dementia in his mother; Lung cancer in his paternal grandfather; Parkinsonism (age of onset: 58) in his father. Allergies  Allergen Reactions  . Nucynta [Tapentadol] Other (See Comments)    dizziness  . Pregabalin     REACTION: dizziness and mental status changes  . Exelon [Rivastigmine]     abd pain     Review of Systems  Constitutional: Negative for fatigue.  Eyes: Negative for visual disturbance.  Respiratory: Negative for cough, chest tightness and shortness of breath.   Cardiovascular: Negative for chest pain, palpitations and leg swelling.  Neurological: Negative for dizziness, syncope, weakness, light-headedness and headaches.  Objective:   Physical Exam  Constitutional: He is oriented to person, place, and time. He appears well-developed and well-nourished.  HENT:  Right Ear: External ear normal.  Left Ear: External ear normal.  Mouth/Throat: Oropharynx is clear and moist.  Eyes: Pupils are equal, round, and reactive to light.  Neck: Neck supple. No thyromegaly present.  Cardiovascular: Normal rate.  His mild bradycardia which is stable for him and chronic with heart rate around 50   Pulmonary/Chest: Effort normal and breath sounds normal. No respiratory distress. He has no wheezes. He has no rales.  Musculoskeletal: He exhibits no edema.  Neurological: He is alert and oriented to person, place, and time.  Skin:  Patient has raised whitish hyperkeratotic lesions dorsum left hand and left side of face. No ulceration       Assessment:     #1 hypertension stable and at goal  #2 GERD with recent recurrent symptoms at night  #3 actinic keratoses left hand and left face    Plan:     -Schedule follow-up for cryotherapy skin lesions and we'll plan to do head to toe check of other skin lesions that point Continue current blood pressure medications -Discussed dietary modification for GERD with handout given -Start with over-the-counter Zantac or Pepcid -Avoid eating within 2-3 hours of bedtime  Eulas Post MD [facility] [place] -

## 2017-07-25 NOTE — Patient Instructions (Addendum)
Food Choices for Gastroesophageal Reflux Disease, Adult When you have gastroesophageal reflux disease (GERD), the foods you eat and your eating habits are very important. Choosing the right foods can help ease the discomfort of GERD. Consider working with a diet and nutrition specialist (dietitian) to help you make healthy food choices. What general guidelines should I follow? Eating plan  Choose healthy foods low in fat, such as fruits, vegetables, whole grains, low-fat dairy products, and lean meat, fish, and poultry.  Eat frequent, small meals instead of three large meals each day. Eat your meals slowly, in a relaxed setting. Avoid bending over or lying down until 2-3 hours after eating.  Limit high-fat foods such as fatty meats or fried foods.  Limit your intake of oils, butter, and shortening to less than 8 teaspoons each day.  Avoid the following: ? Foods that cause symptoms. These may be different for different people. Keep a food diary to keep track of foods that cause symptoms. ? Alcohol. ? Drinking large amounts of liquid with meals. ? Eating meals during the 2-3 hours before bed.  Cook foods using methods other than frying. This may include baking, grilling, or broiling. Lifestyle   Maintain a healthy weight. Ask your health care provider what weight is healthy for you. If you need to lose weight, work with your health care provider to do so safely.  Exercise for at least 30 minutes on 5 or more days each week, or as told by your health care provider.  Avoid wearing clothes that fit tightly around your waist and chest.  Do not use any products that contain nicotine or tobacco, such as cigarettes and e-cigarettes. If you need help quitting, ask your health care provider.  Sleep with the head of your bed raised. Use a wedge under the mattress or blocks under the bed frame to raise the head of the bed. What foods are not recommended? The items listed may not be a complete  list. Talk with your dietitian about what dietary choices are best for you. Grains Pastries or quick breads with added fat. French toast. Vegetables Deep fried vegetables. French fries. Any vegetables prepared with added fat. Any vegetables that cause symptoms. For some people this may include tomatoes and tomato products, chili peppers, onions and garlic, and horseradish. Fruits Any fruits prepared with added fat. Any fruits that cause symptoms. For some people this may include citrus fruits, such as oranges, grapefruit, pineapple, and lemons. Meats and other protein foods High-fat meats, such as fatty beef or pork, hot dogs, ribs, ham, sausage, salami and bacon. Fried meat or protein, including fried fish and fried chicken. Nuts and nut butters. Dairy Whole milk and chocolate milk. Sour cream. Cream. Ice cream. Cream cheese. Milk shakes. Beverages Coffee and tea, with or without caffeine. Carbonated beverages. Sodas. Energy drinks. Fruit juice made with acidic fruits (such as orange or grapefruit). Tomato juice. Alcoholic drinks. Fats and oils Butter. Margarine. Shortening. Ghee. Sweets and desserts Chocolate and cocoa. Donuts. Seasoning and other foods Pepper. Peppermint and spearmint. Any condiments, herbs, or seasonings that cause symptoms. For some people, this may include curry, hot sauce, or vinegar-based salad dressings. Summary  When you have gastroesophageal reflux disease (GERD), food and lifestyle choices are very important to help ease the discomfort of GERD.  Eat frequent, small meals instead of three large meals each day. Eat your meals slowly, in a relaxed setting. Avoid bending over or lying down until 2-3 hours after eating.  Limit high-fat   foods such as fatty meat or fried foods. This information is not intended to replace advice given to you by your health care provider. Make sure you discuss any questions you have with your health care provider. Document Released:  08/05/2005 Document Revised: 08/06/2016 Document Reviewed: 08/06/2016 Elsevier Interactive Patient Education  2017 [place].  Set up 30 minute follow up for procedure visit.   Actinic Keratosis An actinic keratosis is a precancerous growth on the skin. This means that it could develop into skin cancer if it is not treated. About 1% of these growths (actinic keratoses) turn into skin cancer within one year if they are not treated. It is important to have all of these growths evaluated to determine the best treatment approach. What are the causes? This condition is caused by getting too much ultraviolet (UV) radiation from the sun or other UV light sources. What increases the risk? The following factors may make you more likely to develop this condition:  Having light-colored skin and blue eyes.  Having blonde or red hair.  Spending a lot of time in the sun.  Inadequate skin protection when outdoors. This may include: ? Not using sunscreen properly. ? Not covering up skin that is exposed to sunlight.  Aging. The risk of developing an actinic keratosis increases with age.  What are the signs or symptoms? Actinic keratoses look like scaly, rough spots of skin.They can be as small as a pinhead or as big as a quarter. They may itch, hurt, or feel sensitive. In most cases, the growths become red. In some cases, they may be skin-colored, light tan, dark tan, pink, or a combination of any of these colors. There may be a small piece of pink or gray skin (skin tag) growing from the actinic keratosis. In some cases, it may be easier to notice actinic keratoses by feeling them, rather than seeing them. Actinic keratoses appear most often on areas of skin that get a lot of sun exposure, including the scalp, face, ears, lips, upper back, forearms, and the backs of the hands. Sometimes, actinic keratoses disappear, but many reappear a few days to a few weeks later. How is this diagnosed? This  condition is usually diagnosed with a physical exam. A tissue sample may be removed from the actinic keratosis and examined under a microscope (biopsy). How is this treated?  Treatment for this condition may include:  Scraping off the actinic keratosis (curettage).  Freezing the actinic keratosis with liquid nitrogen (cryosurgery). This causes the growth to eventually fall off the skin.  Applying medicated creams or gels to destroy the cells in the growth.  Applying chemicals to the actinic keratosis to make the outer layers of skin peel off (chemical peel).  Photodynamic therapy. In this procedure, medicated cream is applied to the actinic keratosis. This cream increases your skin's sensitivity to light. Then, a strong light is aimed at the actinic keratosis to destroy cells in the growth.  Follow these instructions at home: Skin care  Apply cool, wet cloths (cool compresses) to the affected areas.  Do not scratch your skin.  Check your skin regularly for any growths, especially growths that: ? Start to itch or bleed. ? Change in size, shape, or color. Caring for the treated area  Keep the treated area clean and dry as told by your health care provider.  Do not apply any medicine, cream, or lotion to the treated area unless your health care provider tells you to do that.  Do  not pick at blisters or try to break them open. This can cause infection and scarring.  If you have red or irritated skin after treatment, follow instructions from your health care provider about how to take care of the treated area. Make sure you: ? Wash your hands with soap and water before you change your bandage (dressing). If soap and water are not available, use hand sanitizer. ? Change your dressing as told by your health care provider.  If you have red or irritated skin after treatment, check your treated area every day for signs of infection. Check for: ? Swelling, pain, or more redness. ? Fluid  or blood. ? Warmth. ? Pus or a bad smell. General instructions  Take over-the-counter and prescription medicines only as told by your health care provider.  Return to your normal activities as told by your health care provider. Ask your health care provider what activities are safe for you.  Do not use any tobacco products, such as cigarettes, chewing tobacco, and e-cigarettes. If you need help quitting, ask your health care provider.  Have a skin exam done every year by a health care provider who is a skin conditions specialist (dermatologist).  Keep all follow-up visits as told by your health care provider. This is important. How is this prevented?  Do not get sunburns.  Try to avoid the sun between 10:00 a.m. and 4:00 p.m. This is when the UV light is the strongest.  Use a sunscreen or sunblock with SPF 30 (sun protection factor 30) or greater.  Apply sunscreen before you are exposed to sunlight, and reapply periodically as often as directed by the instructions on the sunscreen container.  Always wear sunglasses that have UV protection, and always wear hats and clothing to protect your skin from sunlight.  When possible, avoid medicines that increase your sensitivity to sunlight. These include: ? Certain antibiotic medicines. ? Certain water pills (diuretics). ? Certain prescription medicines that are used to treat acne (retinoids).  Do not use tanning beds or other indoor tanning devices. Contact a health care provider if:  You notice any changes or new growths on your skin.  You have swelling, pain, or more redness around your treated area.  You have fluid or blood coming from your treated area.  Your treated area feels warm to the touch.  You have pus or a bad smell coming from your treated area.  You have a fever.  You have a blister that becomes large and painful. This information is not intended to replace advice given to you by your health care provider. Make  sure you discuss any questions you have with your health care provider. Document Released: 11/01/2008 Document Revised: 04/05/2016 Document Reviewed: 04/15/2015 Elsevier Interactive Patient Education  [employer].

## 2017-07-27 DIAGNOSIS — L570 Actinic keratosis: Secondary | ICD-10-CM | POA: Insufficient documentation

## 2017-08-01 DIAGNOSIS — N289 Disorder of kidney and ureter, unspecified: Secondary | ICD-10-CM | POA: Diagnosis not present

## 2017-08-28 DIAGNOSIS — R351 Nocturia: Secondary | ICD-10-CM | POA: Diagnosis not present

## 2017-08-28 DIAGNOSIS — N3941 Urge incontinence: Secondary | ICD-10-CM | POA: Diagnosis not present

## 2017-09-03 ENCOUNTER — Encounter: Payer: Self-pay | Admitting: Family Medicine

## 2017-09-03 ENCOUNTER — Ambulatory Visit (INDEPENDENT_AMBULATORY_CARE_PROVIDER_SITE_OTHER): Payer: PPO | Admitting: Family Medicine

## 2017-09-03 VITALS — BP 110/80 | HR 84 | Temp 97.7°F | Wt 155.6 lb

## 2017-09-03 DIAGNOSIS — L821 Other seborrheic keratosis: Secondary | ICD-10-CM | POA: Diagnosis not present

## 2017-09-03 DIAGNOSIS — L570 Actinic keratosis: Secondary | ICD-10-CM | POA: Diagnosis not present

## 2017-09-03 NOTE — Patient Instructions (Signed)
Keep skin clean with soap and water Follow up for any increased redness or other concerns.

## 2017-09-03 NOTE — Progress Notes (Signed)
Subjective:     Patient ID: [name]., male   DOB: 1945/09/28, 72 y.o.   MRN: [id number]  HPI Patient is here to have several skin lesions treated. This is a procedure only visit as skin lesions were looked at last visit. He has hyperkeratotic lesion left mandible area which has been present for several months and slowly growing. He has some brownish lesions including dorsum of the right ear and right upper neck region. These frequently itch and he is requesting treatment  Past Medical History:  Diagnosis Date  . Abdominal pain, right lower quadrant   . Abnormality of gait 05/16/2016  . Acute prostatitis   . Anxiety   . Constipation   . Degenerative disc disease   . Depression   . Diverticulosis of colon (without mention of hemorrhage)   . Essential and other specified forms of tremor   . Family history of malignant neoplasm of gastrointestinal tract   . Fibromyalgia   . Ganglion and cyst of synovium, tendon, and bursa   . GERD (gastroesophageal reflux disease)   . Hemarthrosis, upper arm   . Hyperlipidemia   . Hypersomnia with sleep apnea, unspecified   . Hypertension   . Hypopotassemia   . Localized osteoarthrosis not specified whether primary or secondary, lower leg   . Lumbago   . Memory difficulties 04/22/2013  . Nocturia   . Osteoporosis, unspecified   . Other testicular hypofunction   . Parkinson disease ([place]) 05/16/2016  . Right inguinal hernia   . Sleep apnea    last sleep study 11/11 on chart- Bipap with settings of 4 per  patient  . Spinal stenosis, lumbar region, without neurogenic claudication   . Syncope and collapse   . Unspecified adverse effect of unspecified drug, medicinal and biological substance   . Unspecified gastritis and gastroduodenitis without mention of hemorrhage   . Urinary frequency    Past Surgical History:  Procedure Laterality Date  . BACK SURGERY     x 3  . [place]  . INGUINAL HERNIA REPAIR  07/19/2011   Procedure:  LAPAROSCOPIC INGUINAL HERNIA;  Surgeon: Odis Hollingshead, MD;  Location: [facility];  Service: General;  Laterality: Right;  Laparoscopic Repair of Recurrent Right  Ingunial Hernia with Mesh  . microdisectomy  03/02/2003, 06/29/2003, 12/24/2005  . TONSILLECTOMY      reports that  has never smoked. he has never used smokeless tobacco. He reports that he does not drink alcohol or use drugs. family history includes Colon cancer in his mother; Dementia in his mother; Lung cancer in his paternal grandfather; Parkinsonism (age of onset: 87) in his father. Allergies  Allergen Reactions  . Nucynta [Tapentadol] Other (See Comments)    dizziness  . Pregabalin     REACTION: dizziness and mental status changes  . Exelon [Rivastigmine]     abd pain     Review of Systems  Constitutional: Negative for appetite change and unexpected weight change.  Hematological: Negative for adenopathy.       Objective:   Physical Exam  Constitutional: He is oriented to person, place, and time. He appears well-developed and well-nourished.  Cardiovascular: Normal rate.  Neurological: He is alert and oriented to person, place, and time.  Skin:  Patient has hyperkeratotic whitish skin lesion which is approximately 1 cm diameter left lateral orbital region. No ulceration.  He has brownish well-demarcated symmetric hyperkeratotic lesions including dorsum of the right ear and also to on the right  side of neck. These are all consistent with seborrheic keratoses       Assessment:     #1 actinic keratosis left face  #2 seborrheic keratoses dorsum right ear and right neck    Plan:     -Discussed risk and benefits of treatment with liquid nitrogen and he wished to proceed. We discussed risks including pain, blistering, infection. Patient consented. Treated without difficulty -keep skin clean with soap and water -Follow-up promptly for signs of secondary infection  Eulas Post MD [facility] at  [facility]

## 2017-09-09 ENCOUNTER — Telehealth: Payer: Self-pay | Admitting: Family Medicine

## 2017-09-09 DIAGNOSIS — E876 Hypokalemia: Secondary | ICD-10-CM

## 2017-09-09 NOTE — Telephone Encounter (Signed)
Copied from [place]. Topic: Appointment Scheduling - Scheduling Inquiry for Clinic >> Sep 09, 2017  3:54 PM Burnis Medin, NT wrote: CRM for notification. See Telephone encounter for: Patier ( pt's care giver)  is calling because she went to pick up patient's KLOR-CON M20 20 MEQ tablet and the pharmacist was concerned about patient taking potassium medication with Trospium chloride er 60 mg.  Pharmacist said that taking these two medications together could cause elevated potassium levels and patient should get his potassium checked. Patient would like a call back for scheduling.  09/09/17.

## 2017-09-09 NOTE — Telephone Encounter (Signed)
We did not even have the Trospium on his med list.  ?prescribed per Urology. He is due for BMP and would go ahead and order that.

## 2017-09-10 NOTE — Telephone Encounter (Signed)
FYI - His urologist prescribed the Trospium and has sent a prescription in.  Patient has an appointment with the lab.

## 2017-09-11 ENCOUNTER — Other Ambulatory Visit (INDEPENDENT_AMBULATORY_CARE_PROVIDER_SITE_OTHER): Payer: PPO

## 2017-09-11 DIAGNOSIS — E876 Hypokalemia: Secondary | ICD-10-CM

## 2017-09-11 DIAGNOSIS — Z1159 Encounter for screening for other viral diseases: Secondary | ICD-10-CM | POA: Diagnosis not present

## 2017-09-11 LAB — BASIC METABOLIC PANEL
BUN: 13 mg/dL (ref 6–23)
CO2: 35 mEq/L — ABNORMAL HIGH (ref 19–32)
CREATININE: 0.94 mg/dL (ref 0.40–1.50)
Calcium: 8.8 mg/dL (ref 8.4–10.5)
Chloride: 101 mEq/L (ref 96–112)
GFR: 83.7 mL/min (ref >60.00)
Glucose, Bld: 90 mg/dL (ref 70–99)
Potassium: 3.5 mEq/L (ref 3.5–5.1)
Sodium: 142 mEq/L (ref 135–145)

## 2017-09-12 LAB — HEPATITIS C ANTIBODY
Hepatitis C Ab: NONREACTIVE
SIGNAL TO CUT-OFF: 0.02 (ref <1.00)

## 2017-09-16 ENCOUNTER — Other Ambulatory Visit: Payer: Self-pay | Admitting: Family Medicine

## 2017-10-05 ENCOUNTER — Other Ambulatory Visit: Payer: Self-pay | Admitting: Family Medicine

## 2017-10-30 ENCOUNTER — Ambulatory Visit (INDEPENDENT_AMBULATORY_CARE_PROVIDER_SITE_OTHER)
Admission: RE | Admit: 2017-10-30 | Discharge: 2017-10-30 | Disposition: A | Discharge status: Home / Self Care | Payer: PPO | Source: Ambulatory Visit | Attending: Family Medicine | Admitting: Family Medicine

## 2017-10-30 ENCOUNTER — Encounter: Payer: Self-pay | Admitting: Family Medicine

## 2017-10-30 ENCOUNTER — Ambulatory Visit (INDEPENDENT_AMBULATORY_CARE_PROVIDER_SITE_OTHER): Payer: PPO | Admitting: Family Medicine

## 2017-10-30 VITALS — BP 148/88 | HR 58 | Temp 97.3°F | Ht 68.0 in | Wt 153.0 lb

## 2017-10-30 DIAGNOSIS — T148XXA Other injury of unspecified body region, initial encounter: Secondary | ICD-10-CM

## 2017-10-30 DIAGNOSIS — R0781 Pleurodynia: Secondary | ICD-10-CM

## 2017-10-30 DIAGNOSIS — R079 Chest pain, unspecified: Secondary | ICD-10-CM | POA: Diagnosis not present

## 2017-10-30 NOTE — Progress Notes (Signed)
Subjective:    Patient ID: [name] Brooking., male    DOB: 1945-10-09, 72 y.o.   MRN: [id number]  Chief Complaint  Patient presents with  . Fall  . Abrasion  Patient is accompanied by his next door neighbor.  HPI Patient was seen today for acute concern.  Patient fell yesterday afternoon around 4 PM in his home.  Pt states his feet became unstable 2/2 Parkinson's disease.  He states his feet started moving backwards, he then got tripped up in a rug and fell on the corner of the coffee table.  Pt's cell phone battery was dead so he could not call for help.  Pt's neighbor happened to stop by after the incident.  Pt has been applying ice to the area.  He has taken some left over oxycodone 5 mg, Aleeve, and Ibuprofen 200 mg for his pain.  Past Medical History:  Diagnosis Date  . Abdominal pain, right lower quadrant   . Abnormality of gait 05/16/2016  . Acute prostatitis   . Anxiety   . Constipation   . Degenerative disc disease   . Depression   . Diverticulosis of colon (without mention of hemorrhage)   . Essential and other specified forms of tremor   . Family history of malignant neoplasm of gastrointestinal tract   . Fibromyalgia   . Ganglion and cyst of synovium, tendon, and bursa   . GERD (gastroesophageal reflux disease)   . Hemarthrosis, upper arm   . Hyperlipidemia   . Hypersomnia with sleep apnea, unspecified   . Hypertension   . Hypopotassemia   . Localized osteoarthrosis not specified whether primary or secondary, lower leg   . Lumbago   . Memory difficulties 04/22/2013  . Nocturia   . Osteoporosis, unspecified   . Other testicular hypofunction   . Parkinson disease ([place]) 05/16/2016  . Right inguinal hernia   . Sleep apnea    last sleep study 11/11 on chart- Bipap with settings of 4 per  patient  . Spinal stenosis, lumbar region, without neurogenic claudication   . Syncope and collapse   . Unspecified adverse effect of unspecified drug, medicinal and biological substance    . Unspecified gastritis and gastroduodenitis without mention of hemorrhage   . Urinary frequency     Allergies  Allergen Reactions  . Nucynta [Tapentadol] Other (See Comments)    dizziness  . Pregabalin     REACTION: dizziness and mental status changes  . Exelon [Rivastigmine]     abd pain    ROS General: Denies fever, chills, night sweats, changes in weight, changes in appetite HEENT: Denies headaches, ear pain, changes in vision, rhinorrhea, sore throat CV: Denies CP, palpitations, SOB, orthopnea Pulm: Denies SOB, cough, wheezing GI: Denies abdominal pain, nausea, vomiting, diarrhea, constipation GU: Denies dysuria, hematuria, frequency, vaginal discharge Msk: Denies muscle cramps, joint pains  +Back pain Neuro: Denies weakness, numbness, tingling Skin: Denies rashes    + bruising Psych: Denies depression, anxiety, hallucinations     Objective:    Blood pressure (!) 148/88, pulse (!) 58, temperature (!) 97.3 F (36.3 C), temperature source Oral, height 5\' 8"  (1.727 m), weight 153 lb (69.4 kg), SpO2 97 %.   Gen. Pleasant, well-nourished, in no distress, normal affect   HEENT: [place]/AT, face symmetric, no scleral icterus, PERRLA, nares patent without drainage Lungs: no accessory muscle use, CTAB, no wheezes or rales.   No flailing noted. Cardiovascular: RRR, no m/r/g, no peripheral edema Abdomen: BS present, soft, NT/ND Musculoskeletal: No  deformities, no cyanosis or clubbing, normal tone Neuro:  A&Ox3, CN II-XII intact, normal gait Skin:  Warm, dry.  L back with ecchymosis and abrasion.  Mild TTP of area.       Wt Readings from Last 3 Encounters:  10/30/17 153 lb (69.4 kg)  09/03/17 155 lb 9.6 oz (70.6 kg)  07/25/17 153 lb 12.8 oz (69.8 kg)    Lab Results  Component Value Date   WBC 6.7 01/01/2016   HGB 14.3 01/01/2016   HCT 41.3 01/01/2016   PLT 188.0 01/01/2016   GLUCOSE 90 09/11/2017   CHOL 203 (H) 03/02/2014   TRIG 41.0 03/02/2014   HDL 76.90  03/02/2014   LDLDIRECT 132.9 11/26/2007   LDLCALC 118 (H) 03/02/2014   ALT 7 01/09/2016   AST 11 01/09/2016   NA 142 09/11/2017   K 3.5 09/11/2017   CL 101 09/11/2017   CREATININE 0.94 09/11/2017   BUN 13 09/11/2017   CO2 35 (H) 09/11/2017   TSH 1.61 01/01/2016   PSA 0.95 03/02/2014   HGBA1C 5.9 01/23/2010    Assessment/Plan:  Rib pain  -given trauma will proceed with CXR -discussed continue using ice to reduce edema - Plan: DG Chest 2 View  Abrasion -hemostatic -keep area clean and dry -dressing applied in clinic -advised to monitor for s/s of infection  Update: pt notified of non-displaced rib fx on 10/31/17.  Pt encouraged to take deep breaths every few hours in lieu of using an incentive spirometer.  Pt give RTC or ED precautions including SOB, increased pain, fever, cough, etc.  Advised likely to take 6 wks to heal.  Pt can take Tramadol prn for pain, has this already available.    F/u in 6 wk. Sooner if needed.  Grier Mitts, MD

## 2017-10-30 NOTE — Patient Instructions (Addendum)
You can continue to take Ibuprofen 400mg  every 6 hours as needed for pain.  You should take this medication with food.  You can also continue to take your regular medications such as Tramadol for pain.  Continue using ice to help reduce the swelling in your back.  Abrasion An abrasion is a cut or scrape on the outer surface of your skin. An abrasion does not extend through all of the layers of your skin. It is important to care for your abrasion properly to prevent infection. What are the causes? Most abrasions are caused by falling on or gliding across the ground or another surface. When your skin rubs on something, the outer and inner layer of skin rubs off. What are the signs or symptoms? A cut or scrape is the main symptom of this condition. The scrape may be bleeding, or it may appear red or pink. If there was an associated fall, there may be an underlying bruise. How is this diagnosed? An abrasion is diagnosed with a physical exam. How is this treated? Treatment for this condition depends on how large and deep the abrasion is. Usually, your abrasion will be cleaned with water and mild soap. This removes any dirt or debris that may be stuck. An antibiotic ointment may be applied to the abrasion to help prevent infection. A bandage (dressing) may be placed on the abrasion to keep it clean. You may also need a tetanus shot. Follow these instructions at home: Medicines  Take or apply medicines only as directed by your health care provider.  If you were prescribed an antibiotic ointment, finish all of it even if you start to feel better. Wound care  Clean the wound with mild soap and water 2-3 times per day or as directed by your health care provider. Pat your wound dry with a clean towel. Do not rub it.  There are many different ways to close and cover a wound. Follow instructions from your health care provider about: ? Wound care. ? Dressing changes and removal.  Check your wound every  day for signs of infection. Watch for: ? Redness, swelling, or pain. ? Fluid, blood, or pus. General instructions   Keep the dressing dry as directed by your health care provider. Do not take baths, swim, use a hot tub, or do anything that would put your wound underwater until your health care provider approves.  If there is swelling, raise (elevate) the injured area above the level of your heart while you are sitting or lying down.  Keep all follow-up visits as directed by your health care provider. This is important. Contact a health care provider if:  You received a tetanus shot and you have swelling, severe pain, redness, or bleeding at the injection site.  Your pain is not controlled with medicine.  You have increased redness, swelling, or pain at the site of your wound. Get help right away if:  You have a red streak going away from your wound.  You have a fever.  You have fluid, blood, or pus coming from your wound.  You notice a bad smell coming from your wound or your dressing. This information is not intended to replace advice given to you by your health care provider. Make sure you discuss any questions you have with your health care provider. Document Released: 05/15/2005 Document Revised: 04/05/2016 Document Reviewed: 08/03/2014 Elsevier Interactive Patient Education  2017 Elsevier Inc.  Chest Wall Pain Chest wall pain is pain in or around the  bones and muscles of your chest. Sometimes, an injury causes this pain. Sometimes, the cause may not be known. This pain may take several weeks or longer to get better. Follow these instructions at home: Pay attention to any changes in your symptoms. Take these actions to help with your pain:  Rest as told by your health care provider.  Avoid activities that cause pain. These include any activities that use your chest muscles or your abdominal and side muscles to lift heavy items.  If directed, apply ice to the painful  area: ? Put ice in a plastic bag. ? Place a towel between your skin and the bag. ? Leave the ice on for 20 minutes, 2-3 times per day.  Take over-the-counter and prescription medicines only as told by your health care provider.  Do not use tobacco products, including cigarettes, chewing tobacco, and e-cigarettes. If you need help quitting, ask your health care provider.  Keep all follow-up visits as told by your health care provider. This is important.  Contact a health care provider if:  You have a fever.  Your chest pain becomes worse.  You have new symptoms. Get help right away if:  You have nausea or vomiting.  You feel sweaty or light-headed.  You have a cough with phlegm (sputum) or you cough up blood.  You develop shortness of breath. This information is not intended to replace advice given to you by your health care provider. Make sure you discuss any questions you have with your health care provider. Document Released: 08/05/2005 Document Revised: 12/14/2015 Document Reviewed: 10/31/2014 Elsevier Interactive Patient Education  [employer].

## 2017-10-31 ENCOUNTER — Encounter: Payer: Self-pay | Admitting: Family Medicine

## 2017-10-31 DIAGNOSIS — M79642 Pain in left hand: Secondary | ICD-10-CM | POA: Diagnosis not present

## 2017-10-31 DIAGNOSIS — S61412A Laceration without foreign body of left hand, initial encounter: Secondary | ICD-10-CM | POA: Diagnosis not present

## 2017-11-07 DIAGNOSIS — N3941 Urge incontinence: Secondary | ICD-10-CM | POA: Diagnosis not present

## 2017-11-07 DIAGNOSIS — R351 Nocturia: Secondary | ICD-10-CM | POA: Diagnosis not present

## 2017-11-11 ENCOUNTER — Ambulatory Visit: Payer: PPO | Admitting: Neurology

## 2017-11-28 ENCOUNTER — Encounter: Payer: Self-pay | Admitting: Adult Health

## 2017-11-28 ENCOUNTER — Ambulatory Visit (INDEPENDENT_AMBULATORY_CARE_PROVIDER_SITE_OTHER): Payer: PPO | Admitting: Adult Health

## 2017-11-28 VITALS — BP 160/78 | Temp 98.5°F | Wt 155.0 lb

## 2017-11-28 DIAGNOSIS — I10 Essential (primary) hypertension: Secondary | ICD-10-CM

## 2017-11-28 NOTE — Progress Notes (Signed)
Subjective:    Patient ID: [name] Brooking., male    DOB: [birth year], 72 y.o.   MRN: [id number]  HPI  72 year old male who  has a past medical history of Abdominal pain, right lower quadrant, Abnormality of gait (05/16/2016), Acute prostatitis, Anxiety, Constipation, Degenerative disc disease, Depression, Diverticulosis of colon (without mention of hemorrhage), Essential and other specified forms of tremor, Family history of malignant neoplasm of gastrointestinal tract, Fibromyalgia, Ganglion and cyst of synovium, tendon, and bursa, GERD (gastroesophageal reflux disease), Hemarthrosis, upper arm, Hyperlipidemia, Hypersomnia with sleep apnea, unspecified, Hypertension, Hypopotassemia, Localized osteoarthrosis not specified whether primary or secondary, lower leg, Lumbago, Memory difficulties (04/22/2013), Nocturia, Osteoporosis, unspecified, Other testicular hypofunction, Parkinson disease ([place]) (05/16/2016), Right inguinal hernia, Sleep apnea, Spinal stenosis, lumbar region, without neurogenic claudication, Syncope and collapse, Unspecified adverse effect of unspecified drug, medicinal and biological substance, Unspecified gastritis and gastroduodenitis without mention of hemorrhage, and Urinary frequency.  He is a patient of Dr. Elease Hashimoto who I am seeing today for complaint of elevated blood pressure.Hehas been monitoring his blood pressure on a routine basis and has noticed that his blood pressure has been steadily increased and have been more variable over the last month. He will have BP readings of normal limits 1 day and the next day his blood pressure is in the 160s or greater systolic.  Denies any changes in diet.  Has not noticed any swelling in his lower extremities.  Has not been experiencing blurred vision or headaches  EMS was called last night and his BP was 222/92. This morning his BP was 143/70 standing and sitting 160/79.    He is currently prescribed Norvasc 2.5 mg, Cozaar 100 mg, and  Metoprolol 25 mg   Review of Systems See  HPI  Past Medical History:  Diagnosis Date  . Abdominal pain, right lower quadrant   . Abnormality of gait 05/16/2016  . Acute prostatitis   . Anxiety   . Constipation   . Degenerative disc disease   . Depression   . Diverticulosis of colon (without mention of hemorrhage)   . Essential and other specified forms of tremor   . Family history of malignant neoplasm of gastrointestinal tract   . Fibromyalgia   . Ganglion and cyst of synovium, tendon, and bursa   . GERD (gastroesophageal reflux disease)   . Hemarthrosis, upper arm   . Hyperlipidemia   . Hypersomnia with sleep apnea, unspecified   . Hypertension   . Hypopotassemia   . Localized osteoarthrosis not specified whether primary or secondary, lower leg   . Lumbago   . Memory difficulties 04/22/2013  . Nocturia   . Osteoporosis, unspecified   . Other testicular hypofunction   . Parkinson disease ([place]) 05/16/2016  . Right inguinal hernia   . Sleep apnea    last sleep study 11/11 on chart- Bipap with settings of 4 per  patient  . Spinal stenosis, lumbar region, without neurogenic claudication   . Syncope and collapse   . Unspecified adverse effect of unspecified drug, medicinal and biological substance   . Unspecified gastritis and gastroduodenitis without mention of hemorrhage   . Urinary frequency     Social History   Socioeconomic History  . Marital status: Widowed    Spouse name: Not on file  . Number of children: 2  . Years of education: college  . Highest education level: Not on file  Occupational History  . Occupation: retired    Fish farm manager: RETIRED  Social Needs  .  Financial resource strain: Not on file  . Food insecurity:    Worry: Not on file    Inability: Not on file  . Transportation needs:    Medical: Not on file    Non-medical: Not on file  Tobacco Use  . Smoking status: Never Smoker  . Smokeless tobacco: Never Used  Substance and Sexual Activity  .  Alcohol use: No    Alcohol/week: 0.0 oz    Comment: less than 5 per week  . Drug use: No  . Sexual activity: Not on file  Lifestyle  . Physical activity:    Days per week: Not on file    Minutes per session: Not on file  . Stress: Not on file  Relationships  . Social connections:    Talks on phone: Not on file    Gets together: Not on file    Attends religious service: Not on file    Active member of club or organization: Not on file    Attends meetings of clubs or organizations: Not on file    Relationship status: Not on file  . Intimate partner violence:    Fear of current or ex partner: Not on file    Emotionally abused: Not on file    Physically abused: Not on file    Forced sexual activity: Not on file  Other Topics Concern  . Not on file  Social History Narrative   Lives at home   Patient is right handed.   Patient drinks 1 cup of caffeine per day.    Past Surgical History:  Procedure Laterality Date  . BACK SURGERY     x 3  . [place]  . INGUINAL HERNIA REPAIR  07/19/2011   Procedure: LAPAROSCOPIC INGUINAL HERNIA;  Surgeon: Odis Hollingshead, MD;  Location: [facility];  Service: General;  Laterality: Right;  Laparoscopic Repair of Recurrent Right  Ingunial Hernia with Mesh  . microdisectomy  03/02/2003, 06/29/2003, 12/24/2005  . TONSILLECTOMY      Family History  Problem Relation Age of Onset  . Colon cancer Mother   . Dementia Mother   . Parkinsonism Father 19  . Lung cancer Paternal Grandfather        smoker  . Liver disease Neg Hx   . Kidney disease Neg Hx   . Esophageal cancer Neg Hx     Allergies  Allergen Reactions  . Nucynta [Tapentadol] Other (See Comments)    dizziness  . Pregabalin     REACTION: dizziness and mental status changes  . Exelon [Rivastigmine]     abd pain    Current Outpatient Medications on File Prior to Visit  Medication Sig Dispense Refill  . amLODipine (NORVASC) 2.5 MG tablet Take 1 tablet (2.5 mg total) by  mouth daily. 90 tablet 3  . Calcium Carbonate-Vitamin D (CALCIUM 600 + D PO) Take 1 tablet by mouth 2 (two) times daily.     . carbidopa-levodopa (SINEMET IR) 25-100 MG tablet Take 1.5 tablets by mouth 3 (three) times daily. 450 tablet 1  . cholecalciferol (VITAMIN D) 1000 UNITS tablet Take 5,000 Units by mouth daily.     . citalopram (CELEXA) 40 MG tablet TAKE 1 TABLET BY MOUTH IN THE MORNING 90 tablet 1  . diclofenac sodium (VOLTAREN) 1 % GEL Apply 2 g topically 4 (four) times daily. 3 Tube 3  . KLOR-CON M20 20 MEQ tablet TAKE 3 TABLETS (60 MEQ TOTAL) BY MOUTH DAILY. 90 tablet 1  .  Krill Oil 1000 MG CAPS Take 1 capsule by mouth 2 (two) times daily.    Marland Kitchen losartan (COZAAR) 100 MG tablet TAKE 1 TABLET (100 MG TOTAL) BY MOUTH DAILY. 90 tablet 2  . MAGNESIUM CITRATE PO Take 200 mg by mouth 2 (two) times daily.     . memantine (NAMENDA) 10 MG tablet TAKE 1 TABLET (10 MG TOTAL) BY MOUTH 2 (TWO) TIMES DAILY. 180 tablet 3  . metoprolol tartrate (LOPRESSOR) 25 MG tablet TAKE ONE TABLET ONCE PER DAY. 90 tablet 1  . Nutritional Supplements (ANTI-OXIDANT COMPLEX PO) Take 10 mg by mouth daily.     . Probiotic Product (ALIGN) 4 MG CAPS Take by mouth daily.     . traMADol (ULTRAM) 50 MG tablet Take 1 tablet (50 mg total) by mouth every 6 (six) hours as needed. 90 tablet 3  . Trospium Chloride 60 MG CP24 Take 60 mg by mouth daily. Prescribed by urologist  11  . vitamin B-12 (CYANOCOBALAMIN) 50 MCG tablet Take 50 mcg by mouth daily.     . [DISCONTINUED] potassium chloride (K-DUR) 10 MEQ tablet Take 1 tablet (10 mEq total) by mouth daily. 1 twice a day for 3 days and then 1 every dat 90 tablet 3   No current facility-administered medications on file prior to visit.     BP (!) 160/78 (BP Location: Left Arm, Patient Position: Sitting)   Temp 98.5 F (36.9 C) (Oral)   Wt 155 lb (70.3 kg)   BMI 23.57 kg/m       Objective:   Physical Exam  Constitutional: He is oriented to person, place, and time. He  appears well-developed and well-nourished. No distress.  Eyes: Pupils are equal, round, and reactive to light. Conjunctivae and EOM are normal. Right eye exhibits no discharge. Left eye exhibits no discharge. No scleral icterus.  Cardiovascular: Normal rate, regular rhythm, normal heart sounds and intact distal pulses. Exam reveals no gallop and no friction rub.  No murmur heard. Pulmonary/Chest: Effort normal and breath sounds normal. No respiratory distress. He has no wheezes. He has no rales. He exhibits no tenderness.  Neurological: He is alert and oriented to person, place, and time.  Skin: Skin is warm and dry. No rash noted. He is not diaphoretic. No erythema. No pallor.  Psychiatric: He has a normal mood and affect. His behavior is normal. Judgment and thought content normal.  Nursing note and vitals reviewed.     Assessment & Plan:  1. Essential hypertension -Blood pressure in the office today originally  200/82.  On recheck 20 minutes later blood pressure dropped to 160/78.  Will have him continue to monitor blood pressure at home and can take an additional 2-1/2 mg and Norvasc in the evening.  He was advised to follow-up with his primary care provider early next week.  If he has any signs or symptoms of stroke then to go to the emergency room right away - Basic metabolic panel - CBC with Differential/Platelet  Dorothyann Peng, NP

## 2017-11-29 LAB — CBC WITH DIFFERENTIAL/PLATELET
Basophils Absolute: 22 {cells}/uL (ref 0–200)
Basophils Relative: 0.4 %
Eosinophils Absolute: 103 {cells}/uL (ref 15–500)
Eosinophils Relative: 1.9 %
HCT: 38 % — ABNORMAL LOW (ref 38.5–50.0)
Hemoglobin: 13.2 g/dL (ref 13.2–17.1)
Lymphs Abs: 1431 {cells}/uL (ref 850–3900)
MCH: 31.7 pg (ref 27.0–33.0)
MCHC: 34.7 g/dL (ref 32.0–36.0)
MCV: 91.1 fL (ref 80.0–100.0)
MPV: 10.1 fL (ref 7.5–12.5)
Monocytes Relative: 10.1 %
Neutro Abs: 3299 {cells}/uL (ref 1500–7800)
Neutrophils Relative %: 61.1 %
Platelets: 159 Thousand/uL (ref 140–400)
RBC: 4.17 Million/uL — ABNORMAL LOW (ref 4.20–5.80)
RDW: 12.9 % (ref 11.0–15.0)
Total Lymphocyte: 26.5 %
WBC mixed population: 545 {cells}/uL (ref 200–950)
WBC: 5.4 Thousand/uL (ref 3.8–10.8)

## 2017-11-29 LAB — BASIC METABOLIC PANEL
BUN / CREAT RATIO: 15 (calc) (ref 6–22)
BUN: 19 mg/dL (ref 7–25)
CO2: 34 mmol/L — AB (ref 20–32)
Calcium: 9.4 mg/dL (ref 8.6–10.3)
Chloride: 103 mmol/L (ref 98–110)
Creat: 1.23 mg/dL — ABNORMAL HIGH (ref 0.70–1.18)
GLUCOSE: 88 mg/dL (ref 65–99)
Potassium: 3.3 mmol/L — ABNORMAL LOW (ref 3.5–5.3)
SODIUM: 143 mmol/L (ref 135–146)

## 2017-12-02 ENCOUNTER — Ambulatory Visit (INDEPENDENT_AMBULATORY_CARE_PROVIDER_SITE_OTHER): Payer: PPO | Admitting: Family Medicine

## 2017-12-02 ENCOUNTER — Encounter: Payer: Self-pay | Admitting: Family Medicine

## 2017-12-02 VITALS — BP 130/80 | HR 59 | Temp 97.5°F | Wt 155.2 lb

## 2017-12-02 DIAGNOSIS — I10 Essential (primary) hypertension: Secondary | ICD-10-CM

## 2017-12-02 MED ORDER — AMLODIPINE BESYLATE 5 MG PO TABS
5.0000 mg | ORAL_TABLET | Freq: Every day | ORAL | 3 refills | Status: DC
Start: 2017-12-02 — End: 2018-08-03

## 2017-12-02 NOTE — Progress Notes (Signed)
Subjective:     Patient ID: [name]., male   DOB: 06-25-1946, 72 y.o.   MRN: [id number]  HPI Patient seen for follow-up hypertension. Takes losartan 100 mg daily and had been on amlodipine 2.5 mg daily. Last week he came in had several readings high 938B systolic and up to 017 systolic. His blood pressure improved some before he left the office. He had some flushing but no headaches. Recent history that he had a couple falls back in March with 114 that resulted then left seventh and eighth rib fractures and possible microfracture. He is coping fairly well pain from that. Is taking fairly frequent Advil. No alcohol use.  Amlodipine was increased from 2.5 mg once daily to twice daily. Blood pressures been improved since then. Patient has had prior history of orthostasis. No recent dietary changes  Past Medical History:  Diagnosis Date  . Abdominal pain, right lower quadrant   . Abnormality of gait 05/16/2016  . Acute prostatitis   . Anxiety   . Constipation   . Degenerative disc disease   . Depression   . Diverticulosis of colon (without mention of hemorrhage)   . Essential and other specified forms of tremor   . Family history of malignant neoplasm of gastrointestinal tract   . Fibromyalgia   . Ganglion and cyst of synovium, tendon, and bursa   . GERD (gastroesophageal reflux disease)   . Hemarthrosis, upper arm   . Hyperlipidemia   . Hypersomnia with sleep apnea, unspecified   . Hypertension   . Hypopotassemia   . Localized osteoarthrosis not specified whether primary or secondary, lower leg   . Lumbago   . Memory difficulties 04/22/2013  . Nocturia   . Osteoporosis, unspecified   . Other testicular hypofunction   . Parkinson disease ([place]) 05/16/2016  . Right inguinal hernia   . Sleep apnea    last sleep study 11/11 on chart- Bipap with settings of 4 per  patient  . Spinal stenosis, lumbar region, without neurogenic claudication   . Syncope and collapse   . Unspecified  adverse effect of unspecified drug, medicinal and biological substance   . Unspecified gastritis and gastroduodenitis without mention of hemorrhage   . Urinary frequency    Past Surgical History:  Procedure Laterality Date  . BACK SURGERY     x 3  . [place]  . INGUINAL HERNIA REPAIR  07/19/2011   Procedure: LAPAROSCOPIC INGUINAL HERNIA;  Surgeon: Odis Hollingshead, MD;  Location: [facility];  Service: General;  Laterality: Right;  Laparoscopic Repair of Recurrent Right  Ingunial Hernia with Mesh  . microdisectomy  03/02/2003, 06/29/2003, 12/24/2005  . TONSILLECTOMY      reports that he has never smoked. He has never used smokeless tobacco. He reports that he does not drink alcohol or use drugs. family history includes Colon cancer in his mother; Dementia in his mother; Lung cancer in his paternal grandfather; Parkinsonism (age of onset: 75) in his father. Allergies  Allergen Reactions  . Nucynta [Tapentadol] Other (See Comments)    dizziness  . Pregabalin     REACTION: dizziness and mental status changes  . Exelon [Rivastigmine]     abd pain     Review of Systems  Constitutional: Positive for fatigue.  Eyes: Negative for visual disturbance.  Respiratory: Negative for cough, chest tightness and shortness of breath.   Cardiovascular: Negative for chest pain, palpitations and leg swelling.  Neurological: Negative for dizziness, syncope, weakness, light-headedness  and headaches.       Objective:   Physical Exam  Constitutional: He appears well-developed and well-nourished.  Neck: Neck supple. No thyromegaly present.  Cardiovascular: Normal rate and regular rhythm.  Pulmonary/Chest: Effort normal and breath sounds normal. No respiratory distress. He has no wheezes. He has no rales.  Musculoskeletal: He exhibits no edema.       Assessment:     Hypertension-greatly improved since last week with increased dose of amlodipine    Plan:     -continue amlodipine and  we sent a new prescription for 5 mg once daily. Also continue losartan. Continue to monitor blood pressure and be in touch if consistent readings greater than 140/90 -try to scale back Advil which could be exacerbating recent elevated BP.  Eulas Post MD [facility]

## 2017-12-06 ENCOUNTER — Other Ambulatory Visit: Payer: Self-pay | Admitting: Neurology

## 2017-12-24 ENCOUNTER — Telehealth: Payer: Self-pay | Admitting: Family Medicine

## 2017-12-24 NOTE — Telephone Encounter (Signed)
Copied from [place] [id number]. Topic: Quick Communication - See Telephone Encounter >> Dec 24, 2017  3:33 PM Bea Graff, NT wrote: CRM for notification. See Telephone encounter for: 12/24/17. Sharyn Lull with [facility] is needing more information on this pt such as his medication list and recent OV notes. CB#: [phone] Fax: [phone]

## 2017-12-26 NOTE — Telephone Encounter (Signed)
Form placed in Dr. Burchettes folder 

## 2017-12-27 NOTE — Telephone Encounter (Signed)
FL2 completed.  

## 2017-12-29 NOTE — Telephone Encounter (Signed)
FL2 faxed and confirmed 

## 2017-12-31 ENCOUNTER — Ambulatory Visit: Payer: PPO

## 2017-12-31 ENCOUNTER — Telehealth: Payer: Self-pay | Admitting: Family Medicine

## 2017-12-31 NOTE — Telephone Encounter (Signed)
Copied from [place] [id number]. Topic: Quick Communication - See Telephone Encounter >> Dec 31, 2017 12:23 PM Aurelio Brash B wrote: CRM for notification. See Telephone encounter for: 12/31/17.  Andree Elk  from [facility] is asking for a call back at   [phone] ,  if not available ask for Carla Drape,  regarding  paperwork

## 2017-12-31 NOTE — Telephone Encounter (Signed)
Caller name: Andree Elk Relation to pt: [facility]  Call back number: [phone]   Reason for call:  Checking the status if paperwork was received, fax yesterday to [phone]. please fax back to [phone], please advise

## 2017-12-31 NOTE — Telephone Encounter (Signed)
Paperwork re-faxed to new fax number

## 2017-12-31 NOTE — Telephone Encounter (Signed)
New paperwork received and placed in Dr [employer] basket

## 2018-01-01 NOTE — Telephone Encounter (Signed)
Snap shot (which includes problem list), and last 2 office visit notes were faxed to Andree Elk at [phone].  Jackelyn Poling stated she already has the medication list.

## 2018-01-01 NOTE — Telephone Encounter (Signed)
Joann,  Sorry to bother but could you send over to them last 2 office progress notes- along with med list and problem list.  I will try to get the rest of their requests tomorrow.

## 2018-01-01 NOTE — Telephone Encounter (Signed)
[name] with [facility] called and would like to talk to someone today about patient and paperwork about dietary and standing orders. Her call back number is [phone]. She said its a urgent matter that needs to be addressed if possible today.

## 2018-01-02 ENCOUNTER — Telehealth: Payer: Self-pay | Admitting: Family Medicine

## 2018-01-02 ENCOUNTER — Other Ambulatory Visit: Payer: Self-pay | Admitting: Family Medicine

## 2018-01-02 ENCOUNTER — Encounter: Payer: Self-pay | Admitting: Family Medicine

## 2018-01-02 ENCOUNTER — Ambulatory Visit (INDEPENDENT_AMBULATORY_CARE_PROVIDER_SITE_OTHER): Payer: PPO | Admitting: Family Medicine

## 2018-01-02 VITALS — BP 120/70 | HR 51 | Temp 97.7°F | Wt 155.8 lb

## 2018-01-02 DIAGNOSIS — R42 Dizziness and giddiness: Secondary | ICD-10-CM | POA: Diagnosis not present

## 2018-01-02 DIAGNOSIS — G20 Parkinson's disease: Secondary | ICD-10-CM

## 2018-01-02 DIAGNOSIS — I10 Essential (primary) hypertension: Secondary | ICD-10-CM | POA: Diagnosis not present

## 2018-01-02 DIAGNOSIS — G20A1 Parkinson's disease without dyskinesia, without mention of fluctuations: Secondary | ICD-10-CM

## 2018-01-02 NOTE — Progress Notes (Signed)
Subjective:     Patient ID: [name] Brooking., male   DOB: 30-Aug-1945, 72 y.o.   MRN: [id number]  HPI Patient here to discuss the following  Follow-up hypertension. We recently increased his amlodipine to 5 mg daily. Also on losartan. Brings in several readings and most have these been consistently less than 962I systolic and less than 90 diastolic. Marland Kitchen He has had tendencies toward orthostasis in the past. Still has some lightheadedness when he first stands up. He plans to discuss this with neurologist next week.  Parkinson's disease. Denies any recent falls. He's had some physical therapy in the past.  He has several papers that need to be completed for his transition into assisted living. He plans to move to [facility] next week. FL-2 already completed..  Past Medical History:  Diagnosis Date  . Abdominal pain, right lower quadrant   . Abnormality of gait 05/16/2016  . Acute prostatitis   . Anxiety   . Constipation   . Degenerative disc disease   . Depression   . Diverticulosis of colon (without mention of hemorrhage)   . Essential and other specified forms of tremor   . Family history of malignant neoplasm of gastrointestinal tract   . Fibromyalgia   . Ganglion and cyst of synovium, tendon, and bursa   . GERD (gastroesophageal reflux disease)   . Hemarthrosis, upper arm   . Hyperlipidemia   . Hypersomnia with sleep apnea, unspecified   . Hypertension   . Hypopotassemia   . Localized osteoarthrosis not specified whether primary or secondary, lower leg   . Lumbago   . Memory difficulties 04/22/2013  . Nocturia   . Osteoporosis, unspecified   . Other testicular hypofunction   . Parkinson disease ([place]) 05/16/2016  . Right inguinal hernia   . Sleep apnea    last sleep study 11/11 on chart- Bipap with settings of 4 per  patient  . Spinal stenosis, lumbar region, without neurogenic claudication   . Syncope and collapse   . Unspecified adverse effect of unspecified drug, medicinal  and biological substance   . Unspecified gastritis and gastroduodenitis without mention of hemorrhage   . Urinary frequency    Past Surgical History:  Procedure Laterality Date  . BACK SURGERY     x 3  . [place]  . INGUINAL HERNIA REPAIR  07/19/2011   Procedure: LAPAROSCOPIC INGUINAL HERNIA;  Surgeon: Odis Hollingshead, MD;  Location: [facility];  Service: General;  Laterality: Right;  Laparoscopic Repair of Recurrent Right  Ingunial Hernia with Mesh  . microdisectomy  03/02/2003, 06/29/2003, 12/24/2005  . TONSILLECTOMY      reports that he has never smoked. He has never used smokeless tobacco. He reports that he does not drink alcohol or use drugs. family history includes Colon cancer in his mother; Dementia in his mother; Lung cancer in his paternal grandfather; Parkinsonism (age of onset: 16) in his father. Allergies  Allergen Reactions  . Nucynta [Tapentadol] Other (See Comments)    dizziness  . Pregabalin     REACTION: dizziness and mental status changes  . Exelon [Rivastigmine]     abd pain     Review of Systems  Constitutional: Negative for fatigue.  Eyes: Negative for visual disturbance.  Respiratory: Negative for cough, chest tightness and shortness of breath.   Cardiovascular: Negative for chest pain and palpitations. Leg swelling: Traceleg edema bilaterally which is chronic.  Genitourinary: Negative for dysuria.  Neurological: Positive for dizziness and light-headedness.  Negative for seizures, syncope, weakness and headaches.       Objective:   Physical Exam  Constitutional: He is oriented to person, place, and time. He appears well-developed and well-nourished.  HENT:  Right Ear: External ear normal.  Left Ear: External ear normal.  Mouth/Throat: Oropharynx is clear and moist.  Eyes: Pupils are equal, round, and reactive to light.  Neck: Neck supple. No thyromegaly present.  Cardiovascular: Normal rate and regular rhythm.  Pulmonary/Chest: Effort  normal and breath sounds normal. No respiratory distress. He has no wheezes. He has no rales.  Musculoskeletal: He exhibits no edema.  Neurological: He is alert and oriented to person, place, and time.       Assessment:     #1 hypertension stable and at goal  #2 Parkinson's disease-   #3 intermittent lightheadedness.Darral Dash been checking several standing readings which have been for the most part stable    Plan:     -Multiple forms were completed for his transition to residence at [place] current blood pressure regimen -Routine follow-up here in 6 months and sooner as needed  Eulas Post MD [facility]

## 2018-01-02 NOTE — Telephone Encounter (Signed)
Spoke with Jackelyn Poling and paperwork re faxed and confirmed.

## 2018-01-02 NOTE — Telephone Encounter (Signed)
Copied from [place] [id number]. Topic: Quick Communication - See Telephone Encounter >> Dec 31, 2017 12:23 PM Aurelio Brash B wrote: CRM for notification. See Telephone encounter for: 12/31/17.  Andree Elk  from [facility] is asking for a call back at   [phone] ,  if not available ask for Carla Drape,  regarding  paperwork >> Jan 02, 2018 10:44 AM Percell Belt A wrote: Can paperwork be refaxed, the bottom of the orders were cut off   Fax number [phone]

## 2018-01-05 ENCOUNTER — Other Ambulatory Visit: Payer: Self-pay | Admitting: Family Medicine

## 2018-01-07 ENCOUNTER — Other Ambulatory Visit: Payer: Self-pay | Admitting: Family Medicine

## 2018-01-07 ENCOUNTER — Telehealth: Payer: Self-pay | Admitting: Family Medicine

## 2018-01-07 NOTE — Telephone Encounter (Signed)
Copied from [place] [id number]. Topic: Quick Communication - See Telephone Encounter >> Jan 07, 2018  9:29 AM Aurelio Brash B wrote: CRM for notification. See Telephone encounter for: 01/07/18.  Jennifer from [facility] called to  say  does not carry 200 mg but can get 250 soft gel  MAGNESIUM CITRATE PO      call back at [phone]

## 2018-01-07 NOTE — Telephone Encounter (Signed)
Spoke with pharmacist and medication list updated.

## 2018-01-07 NOTE — Telephone Encounter (Signed)
Ok to switch 

## 2018-01-08 ENCOUNTER — Ambulatory Visit (INDEPENDENT_AMBULATORY_CARE_PROVIDER_SITE_OTHER): Payer: PPO | Admitting: Neurology

## 2018-01-08 ENCOUNTER — Encounter: Payer: Self-pay | Admitting: Neurology

## 2018-01-08 VITALS — BP 136/66 | HR 48 | Ht 68.0 in | Wt 157.0 lb

## 2018-01-08 DIAGNOSIS — G20 Parkinson's disease: Secondary | ICD-10-CM | POA: Diagnosis not present

## 2018-01-08 MED ORDER — CARBIDOPA-LEVODOPA 25-100 MG PO TABS: 2.0000 | ORAL_TABLET | Freq: Three times a day (TID) | ORAL | 1 refills | Status: DC

## 2018-01-08 NOTE — Progress Notes (Signed)
Reason for visit: Parkinson's disease  [name] Hurlbut. is an 72 y.o. male  History of present illness:  [name] is a 72 year old right-handed white male with a history of Parkinson's disease associated with a gait disorder.  The patient had 2 falls back to back in mid March 2019.  The patient did not hit his head with the falls.  He has not fallen since that time, but he is concerned that his balance is worsening.  He remains on Sinemet taking 1.5 of the 25/100 mg tablets 3 times daily.  He does not notice any wearing off effect from the medication.  He is otherwise tolerating the medication well.  He does believe there has been some progression of his memory.  The patient is now in [facility] extended care facility.  He is noting some troubles with back pain, particularly on the left.  In the past he has been seen by Dr. Hardin Negus from pain management in the past, he has undergone facet joint injections and epidural injections, spinal stimulator trials without benefit.  The patient has been on Cymbalta, Lyrica, gabapentin, all without benefit.  He has taken Ultram and oxycodone in the past as well.  The patient denies any problems with swallowing or choking.  He returns for an evaluation today.  At times, he feels somewhat woozy when he is standing up, he fatigues somewhat when he is up on his feet too long.  Past Medical History:  Diagnosis Date  . Abdominal pain, right lower quadrant   . Abnormality of gait 05/16/2016  . Acute prostatitis   . Anxiety   . Constipation   . Degenerative disc disease   . Depression   . Diverticulosis of colon (without mention of hemorrhage)   . Essential and other specified forms of tremor   . Family history of malignant neoplasm of gastrointestinal tract   . Fibromyalgia   . Ganglion and cyst of synovium, tendon, and bursa   . GERD (gastroesophageal reflux disease)   . Hemarthrosis, upper arm   . Hyperlipidemia   . Hypersomnia with sleep apnea,  unspecified   . Hypertension   . Hypopotassemia   . Localized osteoarthrosis not specified whether primary or secondary, lower leg   . Lumbago   . Memory difficulties 04/22/2013  . Nocturia   . Osteoporosis, unspecified   . Other testicular hypofunction   . Parkinson disease ([place]) 05/16/2016  . Right inguinal hernia   . Sleep apnea    last sleep study 11/11 on chart- Bipap with settings of 4 per  patient  . Spinal stenosis, lumbar region, without neurogenic claudication   . Syncope and collapse   . Unspecified adverse effect of unspecified drug, medicinal and biological substance   . Unspecified gastritis and gastroduodenitis without mention of hemorrhage   . Urinary frequency     Past Surgical History:  Procedure Laterality Date  . BACK SURGERY     x 3  . [place]  . INGUINAL HERNIA REPAIR  07/19/2011   Procedure: LAPAROSCOPIC INGUINAL HERNIA;  Surgeon: Odis Hollingshead, MD;  Location: [facility];  Service: General;  Laterality: Right;  Laparoscopic Repair of Recurrent Right  Ingunial Hernia with Mesh  . microdisectomy  03/02/2003, 06/29/2003, 12/24/2005  . TONSILLECTOMY      Family History  Problem Relation Age of Onset  . Colon cancer Mother   . Dementia Mother   . Parkinsonism Father 32  . Lung cancer Paternal  Grandfather        smoker  . Liver disease Neg Hx   . Kidney disease Neg Hx   . Esophageal cancer Neg Hx     Social history:  reports that he has never smoked. He has never used smokeless tobacco. He reports that he does not drink alcohol or use drugs.    Allergies  Allergen Reactions  . Nucynta [Tapentadol] Other (See Comments)    dizziness  . Pregabalin     REACTION: dizziness and mental status changes  . Exelon [Rivastigmine]     abd pain    Medications:  Prior to Admission medications   Medication Sig Start Date End Date Taking? Authorizing Provider  amLODipine (NORVASC) 5 MG tablet Take 1 tablet (5 mg total) by mouth daily. 12/02/17   Yes Burchette, Alinda Sierras, MD  Calcium Carbonate-Vitamin D (CALCIUM 600 + D PO) Take 1 tablet by mouth 2 (two) times daily.    Yes [provider]  carbidopa-levodopa (SINEMET IR) 25-100 MG tablet Take 2 tablets by mouth 3 (three) times daily. 01/08/18  Yes Kathrynn Ducking, MD  cholecalciferol (VITAMIN D) 1000 UNITS tablet Take 5,000 Units by mouth daily.    Yes [provider]  citalopram (CELEXA) 40 MG tablet TAKE 1 TABLET BY MOUTH IN THE MORNING 01/05/18  Yes Burchette, Alinda Sierras, MD  diclofenac sodium (VOLTAREN) 1 % GEL Apply 2 g topically 4 (four) times daily. 12/04/16  Yes Mcarthur Rossetti, MD  KLOR-CON M20 20 MEQ tablet TAKE 3 TABLETS (60 MEQ TOTAL) BY MOUTH DAILY. 01/02/18  Yes Burchette, Alinda Sierras, MD  Krill Oil 1000 MG CAPS Take 1 capsule by mouth 2 (two) times daily.   Yes [provider]  losartan (COZAAR) 100 MG tablet TAKE 1 TABLET (100 MG TOTAL) BY MOUTH DAILY. 09/16/17  Yes Burchette, Alinda Sierras, MD  MAGNESIUM CITRATE PO Take 250 mg by mouth 2 (two) times daily.    Yes [provider]  memantine (NAMENDA) 10 MG tablet TAKE 1 TABLET (10 MG TOTAL) BY MOUTH 2 (TWO) TIMES DAILY. 12/08/17  Yes Kathrynn Ducking, MD  metoprolol tartrate (LOPRESSOR) 25 MG tablet TAKE ONE TABLET ONCE PER DAY. 01/07/18  Yes Burchette, Alinda Sierras, MD  Nutritional Supplements (ANTI-OXIDANT COMPLEX PO) Take 10 mg by mouth daily.    Yes [provider]  Probiotic Product (ALIGN) 4 MG CAPS Take by mouth daily.    Yes [provider]  vitamin B-12 (CYANOCOBALAMIN) 50 MCG tablet Take 50 mcg by mouth daily.    Yes [provider]  potassium chloride (K-DUR) 10 MEQ tablet Take 1 tablet (10 mEq total) by mouth daily. 1 twice a day for 3 days and then 1 every dat 07/06/12 07/12/13  Ricard Dillon, MD    ROS:  Out of a complete 14 system review of symptoms, the patient complains only of the following symptoms, and all other reviewed systems are negative.  Balance  problems Memory problems  Blood pressure 136/66, pulse (!) 48, height 5\' 8"  (1.727 m), weight 157 lb (71.2 kg).   Blood pressure, right arm, sitting is 166/80.  Blood pressure, right arm, standing is 164/69.  Physical Exam  General: The patient is alert and cooperative at the time of the examination.  Skin: No significant peripheral edema is noted.   Neurologic Exam  Mental status: The patient is alert and oriented x 3 at the time of the examination. The Mini-Mental status examination done today shows a total  score 25/30.   Cranial nerves: Facial symmetry is present. Speech is normal, no aphasia or dysarthria is noted. Extraocular movements are full. Visual fields are full.  Masking of the face is seen.  Motor: The patient has good strength in all 4 extremities.  Sensory examination: Soft touch sensation is symmetric on the face, arms, and legs.  Coordination: The patient has good finger-nose-finger and heel-to-shin bilaterally.  Gait and station: The patient has a stooped posture, the patient is able to walk independently, he has some difficulty standing up from a seated position with the arms crossed. Tandem gait is normal. Romberg is negative. No drift is seen.  Reflexes: Deep tendon reflexes are symmetric.   Assessment/Plan:  1.  Parkinson's disease  2.  Memory disturbance  3.  Gait disturbance  4.  Chronic low back pain  The patient continues to have ongoing issues with his chronic low back pain.  He has been followed in the past through Dr. Hardin Negus, he is no longer followed through that pain center.  He will let me know if he wishes a another referral to a pain center.  The patient will be increased on Sinemet taking the 25/100 mg tablets taking 2 tablets 3 times a day.  The patient will be set up for physical therapy for gait training.  He will need to enter into a regular exercise regimen.  He will follow-up in 4 months.  Jill Alexanders MD 01/08/2018 10:49  AM  [facility] [address] [place], [place] [address]  Phone [phone] Fax [phone]

## 2018-01-15 DIAGNOSIS — M1991 Primary osteoarthritis, unspecified site: Secondary | ICD-10-CM | POA: Diagnosis not present

## 2018-01-15 DIAGNOSIS — F419 Anxiety disorder, unspecified: Secondary | ICD-10-CM | POA: Diagnosis not present

## 2018-01-15 DIAGNOSIS — M810 Age-related osteoporosis without current pathological fracture: Secondary | ICD-10-CM | POA: Diagnosis not present

## 2018-01-15 DIAGNOSIS — I10 Essential (primary) hypertension: Secondary | ICD-10-CM | POA: Diagnosis not present

## 2018-01-15 DIAGNOSIS — Z9181 History of falling: Secondary | ICD-10-CM | POA: Diagnosis not present

## 2018-01-15 DIAGNOSIS — M519 Unspecified thoracic, thoracolumbar and lumbosacral intervertebral disc disorder: Secondary | ICD-10-CM | POA: Diagnosis not present

## 2018-01-15 DIAGNOSIS — K5730 Diverticulosis of large intestine without perforation or abscess without bleeding: Secondary | ICD-10-CM | POA: Diagnosis not present

## 2018-01-15 DIAGNOSIS — G20 Parkinson's disease: Secondary | ICD-10-CM | POA: Diagnosis not present

## 2018-01-15 DIAGNOSIS — M48061 Spinal stenosis, lumbar region without neurogenic claudication: Secondary | ICD-10-CM | POA: Diagnosis not present

## 2018-01-15 DIAGNOSIS — M797 Fibromyalgia: Secondary | ICD-10-CM | POA: Diagnosis not present

## 2018-01-15 DIAGNOSIS — G4730 Sleep apnea, unspecified: Secondary | ICD-10-CM | POA: Diagnosis not present

## 2018-01-15 DIAGNOSIS — F329 Major depressive disorder, single episode, unspecified: Secondary | ICD-10-CM | POA: Diagnosis not present

## 2018-01-19 ENCOUNTER — Telehealth: Payer: Self-pay | Admitting: Family Medicine

## 2018-01-19 NOTE — Telephone Encounter (Signed)
OK 

## 2018-01-19 NOTE — Telephone Encounter (Signed)
Verbal orders given to Michelle. 

## 2018-01-19 NOTE — Telephone Encounter (Signed)
Copied from [place] [phone]. Topic: Quick Communication - See Telephone Encounter >> Jan 19, 2018 12:26 PM Bea Graff, NT wrote: CRM for notification. See Telephone encounter for: 01/19/18. Michelle with [facility] calling to get verbal orders for physical therapy for twice a week for 2 weeks. CB#: [phone]

## 2018-01-20 ENCOUNTER — Telehealth: Payer: Self-pay | Admitting: Family Medicine

## 2018-01-20 DIAGNOSIS — M1991 Primary osteoarthritis, unspecified site: Secondary | ICD-10-CM | POA: Diagnosis not present

## 2018-01-20 DIAGNOSIS — G20 Parkinson's disease: Secondary | ICD-10-CM | POA: Diagnosis not present

## 2018-01-20 DIAGNOSIS — I10 Essential (primary) hypertension: Secondary | ICD-10-CM | POA: Diagnosis not present

## 2018-01-20 DIAGNOSIS — M810 Age-related osteoporosis without current pathological fracture: Secondary | ICD-10-CM | POA: Diagnosis not present

## 2018-01-20 DIAGNOSIS — M48061 Spinal stenosis, lumbar region without neurogenic claudication: Secondary | ICD-10-CM | POA: Diagnosis not present

## 2018-01-20 DIAGNOSIS — G4730 Sleep apnea, unspecified: Secondary | ICD-10-CM | POA: Diagnosis not present

## 2018-01-20 DIAGNOSIS — F329 Major depressive disorder, single episode, unspecified: Secondary | ICD-10-CM | POA: Diagnosis not present

## 2018-01-20 DIAGNOSIS — F419 Anxiety disorder, unspecified: Secondary | ICD-10-CM | POA: Diagnosis not present

## 2018-01-20 DIAGNOSIS — Z9181 History of falling: Secondary | ICD-10-CM | POA: Diagnosis not present

## 2018-01-20 DIAGNOSIS — M519 Unspecified thoracic, thoracolumbar and lumbosacral intervertebral disc disorder: Secondary | ICD-10-CM | POA: Diagnosis not present

## 2018-01-20 DIAGNOSIS — K5730 Diverticulosis of large intestine without perforation or abscess without bleeding: Secondary | ICD-10-CM | POA: Diagnosis not present

## 2018-01-20 DIAGNOSIS — M797 Fibromyalgia: Secondary | ICD-10-CM | POA: Diagnosis not present

## 2018-01-20 NOTE — Telephone Encounter (Signed)
Copied from [place] [id number]. Topic: Quick Communication - See Telephone Encounter >> Jan 20, 2018 12:39 PM Boyd Kerbs wrote: CRM for notification. See Telephone encounter for: 01/20/18.  Anna Genre - [facility] [phone] needing verbal orders for Speech Therapy  1 x 2;   2 x 1, addressing  cognition.

## 2018-01-20 NOTE — Telephone Encounter (Signed)
OK 

## 2018-01-20 NOTE — Telephone Encounter (Signed)
Verbal order given to [facility] for speech therapy

## 2018-01-22 ENCOUNTER — Telehealth: Payer: Self-pay | Admitting: Neurology

## 2018-01-22 NOTE — Telephone Encounter (Signed)
I called the son and then I called [facility], the patient will try to get the medications and before meals, he is on the 25/100 mg tablets taking 2 tablets 3 times daily.  He will be getting the medication at 7 AM, 2 PM, and at 8 PM.  This will separate the medication from the meals.  The fax number for [facility] is [phone]

## 2018-01-22 NOTE — Telephone Encounter (Signed)
Pts son(Jason) called stating that [facility] is needing the directions for carbidopa-levodopa (SINEMET IR) 25-100 MG tablet to state take one hour before eating. [name] requesting a call back to discuss

## 2018-01-23 NOTE — Telephone Encounter (Signed)
Dr. Jannifer Franklin faxed orders to [facility]. Fax confirmation was received.

## 2018-01-26 DIAGNOSIS — M810 Age-related osteoporosis without current pathological fracture: Secondary | ICD-10-CM | POA: Diagnosis not present

## 2018-01-26 DIAGNOSIS — M797 Fibromyalgia: Secondary | ICD-10-CM | POA: Diagnosis not present

## 2018-01-26 DIAGNOSIS — F419 Anxiety disorder, unspecified: Secondary | ICD-10-CM | POA: Diagnosis not present

## 2018-01-26 DIAGNOSIS — Z9181 History of falling: Secondary | ICD-10-CM | POA: Diagnosis not present

## 2018-01-26 DIAGNOSIS — M48061 Spinal stenosis, lumbar region without neurogenic claudication: Secondary | ICD-10-CM | POA: Diagnosis not present

## 2018-01-26 DIAGNOSIS — F329 Major depressive disorder, single episode, unspecified: Secondary | ICD-10-CM | POA: Diagnosis not present

## 2018-01-26 DIAGNOSIS — M1991 Primary osteoarthritis, unspecified site: Secondary | ICD-10-CM | POA: Diagnosis not present

## 2018-01-26 DIAGNOSIS — K5730 Diverticulosis of large intestine without perforation or abscess without bleeding: Secondary | ICD-10-CM | POA: Diagnosis not present

## 2018-01-26 DIAGNOSIS — G20 Parkinson's disease: Secondary | ICD-10-CM | POA: Diagnosis not present

## 2018-01-26 DIAGNOSIS — G4730 Sleep apnea, unspecified: Secondary | ICD-10-CM | POA: Diagnosis not present

## 2018-01-26 DIAGNOSIS — I10 Essential (primary) hypertension: Secondary | ICD-10-CM | POA: Diagnosis not present

## 2018-01-26 DIAGNOSIS — M519 Unspecified thoracic, thoracolumbar and lumbosacral intervertebral disc disorder: Secondary | ICD-10-CM | POA: Diagnosis not present

## 2018-01-30 ENCOUNTER — Telehealth: Payer: Self-pay | Admitting: *Deleted

## 2018-01-30 NOTE — Telephone Encounter (Signed)
ok 

## 2018-01-30 NOTE — Telephone Encounter (Signed)
Copied from [place] [phone]. Topic: General - Other >> Jan 30, 2018 10:51 AM Yvette Rack wrote: Reason for CRM: PT [name] from [facility] [phone] need order for one more home PT one of the visits was delayed due to insurance asap

## 2018-01-30 NOTE — Telephone Encounter (Signed)
Left message on machine for [place] with verbal orders.

## 2018-02-02 ENCOUNTER — Telehealth: Payer: Self-pay | Admitting: *Deleted

## 2018-02-02 NOTE — Telephone Encounter (Signed)
OK to refill Celexa, but we reduced dose to 20 mg daily. Check BP once daily and record.

## 2018-02-02 NOTE — Telephone Encounter (Signed)
Patient now uses [facility] in [place].  Pharmacy change noted in chart  Son would like an order for patient to have blood pressures read and recorded sent to [facility].  Okay for refill and order?

## 2018-02-03 MED ORDER — CITALOPRAM HYDROBROMIDE 20 MG PO TABS: 20.0000 mg | ORAL_TABLET | Freq: Every day | ORAL | 1 refills | Status: DC

## 2018-02-03 NOTE — Telephone Encounter (Signed)
Rx sent and order faxed and confirmed.

## 2018-02-04 ENCOUNTER — Telehealth: Payer: Self-pay | Admitting: *Deleted

## 2018-02-04 DIAGNOSIS — I10 Essential (primary) hypertension: Secondary | ICD-10-CM

## 2018-02-04 DIAGNOSIS — R42 Dizziness and giddiness: Secondary | ICD-10-CM

## 2018-02-04 NOTE — Telephone Encounter (Signed)
Spoke with son.  Referral placed.  Order faxed and confirmed.

## 2018-02-04 NOTE — Telephone Encounter (Signed)
Is BP still high?  They are supposed to be monitoring and recording at his new residence.  Would be helpful if we could get some feedback with those readings.

## 2018-02-04 NOTE — Telephone Encounter (Signed)
Copied from [place] [phone]. Topic: Referral - Request >> Feb 04, 2018 10:43 AM Pricilla Handler wrote: Reason for CRM: Patient's son [name] called requesting if Dr. Elease Hashimoto would refer the patient to a cardiologist regarding patient's HBP issues. Please call [name] at [phone].          Thank You!!!

## 2018-02-04 NOTE — Telephone Encounter (Signed)
Son is worried because the patient's blood pressure is still high.  Thursday his blood in the AM 150/100, 148/90 then 192/87, 190/90 in the PM.  The patient's blood pressure was up again last night but the son is not sure of the reading.  The facility will start taking and recording his blood pressure today.    Son states that "nothing was done about the blood press at the last office visit".    Son would like a referral to cardiology.  Please advise

## 2018-02-04 NOTE — Telephone Encounter (Signed)
His blood pressure last office visit was 120/70. Also, we had reports that he had multiple home readings less than 550I systolic. He also has history of orthostasis because of his Sinemet. We therefore did not increase his medications last office visit.  Readings just reported to [place] are quite higher.  If son is wanting cardiology referral- then let's give them the referral.  They may not get in there immediately.  Would also suggest that his facility check some orthostatic/standing blood pressures as he has been prone to orthostatic changes in past.

## 2018-02-05 DIAGNOSIS — G20 Parkinson's disease: Secondary | ICD-10-CM | POA: Diagnosis not present

## 2018-02-05 DIAGNOSIS — Z9181 History of falling: Secondary | ICD-10-CM | POA: Diagnosis not present

## 2018-02-05 DIAGNOSIS — F329 Major depressive disorder, single episode, unspecified: Secondary | ICD-10-CM | POA: Diagnosis not present

## 2018-02-05 DIAGNOSIS — M797 Fibromyalgia: Secondary | ICD-10-CM | POA: Diagnosis not present

## 2018-02-05 DIAGNOSIS — I10 Essential (primary) hypertension: Secondary | ICD-10-CM | POA: Diagnosis not present

## 2018-02-05 DIAGNOSIS — M1991 Primary osteoarthritis, unspecified site: Secondary | ICD-10-CM | POA: Diagnosis not present

## 2018-02-05 DIAGNOSIS — M48061 Spinal stenosis, lumbar region without neurogenic claudication: Secondary | ICD-10-CM | POA: Diagnosis not present

## 2018-02-05 DIAGNOSIS — F419 Anxiety disorder, unspecified: Secondary | ICD-10-CM | POA: Diagnosis not present

## 2018-02-05 DIAGNOSIS — G4730 Sleep apnea, unspecified: Secondary | ICD-10-CM | POA: Diagnosis not present

## 2018-02-05 DIAGNOSIS — M810 Age-related osteoporosis without current pathological fracture: Secondary | ICD-10-CM | POA: Diagnosis not present

## 2018-02-05 DIAGNOSIS — K5730 Diverticulosis of large intestine without perforation or abscess without bleeding: Secondary | ICD-10-CM | POA: Diagnosis not present

## 2018-02-05 DIAGNOSIS — M519 Unspecified thoracic, thoracolumbar and lumbosacral intervertebral disc disorder: Secondary | ICD-10-CM | POA: Diagnosis not present

## 2018-02-24 ENCOUNTER — Ambulatory Visit (INDEPENDENT_AMBULATORY_CARE_PROVIDER_SITE_OTHER): Payer: PPO | Admitting: Cardiovascular Disease

## 2018-02-24 ENCOUNTER — Encounter: Payer: Self-pay | Admitting: Cardiovascular Disease

## 2018-02-24 VITALS — BP 125/63 | HR 49 | Ht 68.0 in | Wt 159.2 lb

## 2018-02-24 DIAGNOSIS — I10 Essential (primary) hypertension: Secondary | ICD-10-CM

## 2018-02-24 NOTE — Patient Instructions (Signed)
Your physician wants you to follow-up in: 6 MONTHS WITH APP You will receive a reminder letter in the mail two months in advance. If you don't receive a letter, please call our office to schedule the follow-up appointment.   Your physician wants you to follow-up in: ONE YEAR WITH DR BERRY You will receive a reminder letter in the mail two months in advance. If you don't receive a letter, please call our office to schedule the follow-up appointment.   If you need a refill on your cardiac medications before your next appointment, please call your pharmacy.  

## 2018-02-24 NOTE — Assessment & Plan Note (Signed)
History of essential hypertension with blood pressure measured today at 125/63.  He is on amlodipine, losartan metoprolol does get occasional spikes in blood pressure but these are fairly rare and usually instigated by anxiety.

## 2018-02-24 NOTE — Progress Notes (Signed)
02/24/2018 [name].   08/07/1946  [id number]  Primary Physician Burchette, Alinda Sierras, MD Primary Cardiologist: Lorretta Harp MD Lupe Carney, [place]  HPI:  [name]. is a 72 y.o. widowed Caucasian male father of 2, grandfather 2 grandchildren is accompanied by his son [name] today.  Referred by Dr. Elease Hashimoto for cardiovascular evaluation because of hypertension.  He has otherwise no cardiac risk factors.  He was recently diagnosed with Parkinson's disease on Sinemet treated by Dr. Jannifer Franklin.  He had high blood pressure for the last 20 to 30 years.  He is recently been transition to a skilled nursing facility over the last 6 weeks.  He is on 3 antihypertensive medications and for the most part his blood pressure seems to be well controlled except for occasional spikes in blood pressure brought on usually by anxiety.  He has no other cardiovascular history.  He denies chest pain or shortness of breath.  He is never had a heart attack or stroke.   Current Meds  Medication Sig  . amLODipine (NORVASC) 5 MG tablet Take 1 tablet (5 mg total) by mouth daily.  . Calcium Carbonate-Vitamin D (CALCIUM 600 + D PO) Take 1 tablet by mouth 2 (two) times daily.   . carbidopa-levodopa (SINEMET IR) 25-100 MG tablet Take 2 tablets by mouth 3 (three) times daily.  . cholecalciferol (VITAMIN D) 1000 UNITS tablet Take 5,000 Units by mouth daily.   . citalopram (CELEXA) 20 MG tablet Take 1 tablet (20 mg total) by mouth daily.  . diclofenac sodium (VOLTAREN) 1 % GEL Apply 2 g topically 4 (four) times daily.  Marland Kitchen KLOR-CON M20 20 MEQ tablet TAKE 3 TABLETS (60 MEQ TOTAL) BY MOUTH DAILY.  Marland Kitchen Krill Oil 1000 MG CAPS Take 1 capsule by mouth 2 (two) times daily.  Marland Kitchen losartan (COZAAR) 100 MG tablet TAKE 1 TABLET (100 MG TOTAL) BY MOUTH DAILY.  Marland Kitchen MAGNESIUM CITRATE PO Take 250 mg by mouth 2 (two) times daily.   . memantine (NAMENDA) 10 MG tablet TAKE 1 TABLET (10 MG TOTAL) BY MOUTH 2 (TWO) TIMES DAILY.  .  metoprolol tartrate (LOPRESSOR) 25 MG tablet TAKE ONE TABLET ONCE PER DAY.  Marland Kitchen Nutritional Supplements (ANTI-OXIDANT COMPLEX PO) Take 10 mg by mouth daily.   . Probiotic Product (ALIGN) 4 MG CAPS Take by mouth daily.   . vitamin B-12 (CYANOCOBALAMIN) 50 MCG tablet Take 50 mcg by mouth daily.   . [DISCONTINUED] potassium chloride (K-DUR) 10 MEQ tablet Take 1 tablet (10 mEq total) by mouth daily. 1 twice a day for 3 days and then 1 every dat     Allergies  Allergen Reactions  . Nucynta [Tapentadol] Other (See Comments)    dizziness  . Pregabalin     REACTION: dizziness and mental status changes  . Exelon [Rivastigmine]     abd pain    Social History   Socioeconomic History  . Marital status: Widowed    Spouse name: Not on file  . Number of children: 2  . Years of education: college  . Highest education level: Not on file  Occupational History  . Occupation: retired    Fish farm manager: RETIRED  Social Needs  . Financial resource strain: Not on file  . Food insecurity:    Worry: Not on file    Inability: Not on file  . Transportation needs:    Medical: Not on file    Non-medical: Not on file  Tobacco Use  .  Smoking status: Never Smoker  . Smokeless tobacco: Never Used  Substance and Sexual Activity  . Alcohol use: No    Alcohol/week: 0.0 oz    Comment: less than 5 per week  . Drug use: No  . Sexual activity: Not on file  Lifestyle  . Physical activity:    Days per week: Not on file    Minutes per session: Not on file  . Stress: Not on file  Relationships  . Social connections:    Talks on phone: Not on file    Gets together: Not on file    Attends religious service: Not on file    Active member of club or organization: Not on file    Attends meetings of clubs or organizations: Not on file    Relationship status: Not on file  . Intimate partner violence:    Fear of current or ex partner: Not on file    Emotionally abused: Not on file    Physically abused: Not on file      Forced sexual activity: Not on file  Other Topics Concern  . Not on file  Social History Narrative   Lives at home   Patient is right handed.   Patient drinks 1 cup of caffeine per day.     Review of Systems: General: negative for chills, fever, night sweats or weight changes.  Cardiovascular: negative for chest pain, dyspnea on exertion, edema, orthopnea, palpitations, paroxysmal nocturnal dyspnea or shortness of breath Dermatological: negative for rash Respiratory: negative for cough or wheezing Urologic: negative for hematuria Abdominal: negative for nausea, vomiting, diarrhea, bright red blood per rectum, melena, or hematemesis Neurologic: negative for visual changes, syncope, or dizziness All other systems reviewed and are otherwise negative except as noted above.    Blood pressure 125/63, pulse (!) 49, height 5\' 8"  (1.727 m), weight 159 lb 3.2 oz (72.2 kg).  General appearance: alert and no distress Neck: no adenopathy, no carotid bruit, no JVD, supple, symmetrical, trachea midline and thyroid not enlarged, symmetric, no tenderness/mass/nodules Lungs: clear to auscultation bilaterally Heart: regular rate and rhythm, S1, S2 normal, no murmur, click, rub or gallop Extremities: extremities normal, atraumatic, no cyanosis or edema Pulses: 2+ and symmetric Skin: Skin color, texture, turgor normal. No rashes or lesions Neurologic: Alert and oriented X 3, normal strength and tone. Normal symmetric reflexes. Normal coordination and gait  EKG sinus bradycardia 49 with RSR prime in leads V1 suggesting RV conduction delay.  I personally reviewed this EKG.  ASSESSMENT AND PLAN:   Essential hypertension History of essential hypertension with blood pressure measured today at 125/63.  He is on amlodipine, losartan metoprolol does get occasional spikes in blood pressure but these are fairly rare and usually instigated by anxiety.      Lorretta Harp MD FACP,FACC,FAHA,  [facility] 02/24/2018 4:33 PM

## 2018-02-25 ENCOUNTER — Telehealth: Payer: Self-pay | Admitting: Family Medicine

## 2018-02-25 NOTE — Telephone Encounter (Unsigned)
Copied from [place] [phone]. Topic: Quick Communication - See Telephone Encounter >> Feb 25, 2018  2:11 PM Percell Belt A wrote: CRM for notification. See Telephone encounter for: 02/25/18.  Kisha with [facility] at home [phone] They will be going out 7/12 to start services Just FYI

## 2018-02-25 NOTE — Telephone Encounter (Signed)
FYI

## 2018-02-28 DIAGNOSIS — M48061 Spinal stenosis, lumbar region without neurogenic claudication: Secondary | ICD-10-CM | POA: Diagnosis not present

## 2018-02-28 DIAGNOSIS — F419 Anxiety disorder, unspecified: Secondary | ICD-10-CM | POA: Diagnosis not present

## 2018-02-28 DIAGNOSIS — F329 Major depressive disorder, single episode, unspecified: Secondary | ICD-10-CM | POA: Diagnosis not present

## 2018-02-28 DIAGNOSIS — I10 Essential (primary) hypertension: Secondary | ICD-10-CM | POA: Diagnosis not present

## 2018-02-28 DIAGNOSIS — Z9181 History of falling: Secondary | ICD-10-CM | POA: Diagnosis not present

## 2018-02-28 DIAGNOSIS — G20 Parkinson's disease: Secondary | ICD-10-CM | POA: Diagnosis not present

## 2018-02-28 DIAGNOSIS — M519 Unspecified thoracic, thoracolumbar and lumbosacral intervertebral disc disorder: Secondary | ICD-10-CM | POA: Diagnosis not present

## 2018-02-28 DIAGNOSIS — K5730 Diverticulosis of large intestine without perforation or abscess without bleeding: Secondary | ICD-10-CM | POA: Diagnosis not present

## 2018-02-28 DIAGNOSIS — M1991 Primary osteoarthritis, unspecified site: Secondary | ICD-10-CM | POA: Diagnosis not present

## 2018-02-28 DIAGNOSIS — M810 Age-related osteoporosis without current pathological fracture: Secondary | ICD-10-CM | POA: Diagnosis not present

## 2018-02-28 DIAGNOSIS — M797 Fibromyalgia: Secondary | ICD-10-CM | POA: Diagnosis not present

## 2018-02-28 DIAGNOSIS — G4730 Sleep apnea, unspecified: Secondary | ICD-10-CM | POA: Diagnosis not present

## 2018-03-30 NOTE — Progress Notes (Addendum)
Subjective:   [name]. is a 72 y.o. male who presents for Medicare Annual (Subsequent) preventive examination.  Reports health as fair, recent falls ushered in a move  to [facility]  He has nice apt, corner spot Eats breakfast, lunch and supper   States he light headed all the time - still has this at times  Wife passed away x 2yo  2 children; one lives in [place] and one in [place]  Lives in small community Note per Dr. Elease Hashimoto in may that [name] moving to [place]   Memory is intact; reaction time is slower. Difficulty with focus with math etc but recall is good. Flips through   No loss of function in the last 6 months Started falling and has had to move  Increased neuropathy in toes and now gait is off  Has a right frozen foot; not easily controlled  Has not fallen due to this. Discussed walker but not ready for this yet. Feels it would get in his way   MED CMA is helping him with meds  States he cannot read the RX on rx so, will make an eye apt soon Has someone who assists him now 3 hours x 5 days a week   BMI 22.4 bMI 28   Exercise HDL 76 Does stretching exercises every day Did PT, OT and ST and had 3 to 4 more visits 6 mth "tune up"  Exercise class - 4 to 5 times a week But can schedule in room 13   Health Maintenance Due  Topic Date Due  . COLONOSCOPY  02/03/2016  . INFLUENZA VACCINE  03/19/2018   Colonoscopy 01/2013; MD retired  Due in 01/2016  Manuela Schwartz to call and check on   Will need flu vaccine        Objective:     Vitals: BP 130/70   Pulse (!) 47   Ht 5\' 8"  (1.727 m)   Wt 163 lb 9 oz (74.2 kg)   SpO2 96%   BMI 24.87 kg/m   Body mass index is 24.87 kg/m.  Advanced Directives 03/28/2017 08/27/2016 05/11/2015 11/03/2014 07/16/2011  Does Patient Have a Medical Advance Directive? Yes Yes Yes Yes Patient has advance directive, copy not in chart  Type of Advance Directive - [place];Living will  [place];Living will [place];Living will -  Copy of [place] in Chart? - Yes No - copy requested - -    Tobacco Social History   Tobacco Use  Smoking Status Never Smoker  Smokeless Tobacco Never Used     Counseling given: Not Answered   Clinical Intake:     Past Medical History:  Diagnosis Date  . Abdominal pain, right lower quadrant   . Abnormality of gait 05/16/2016  . Acute prostatitis   . Anxiety   . Constipation   . Degenerative disc disease   . Depression   . Diverticulosis of colon (without mention of hemorrhage)   . Essential and other specified forms of tremor   . Family history of malignant neoplasm of gastrointestinal tract   . Fibromyalgia   . Ganglion and cyst of synovium, tendon, and bursa   . GERD (gastroesophageal reflux disease)   . Hemarthrosis, upper arm   . Hyperlipidemia   . Hypersomnia with sleep apnea, unspecified   . Hypertension   . Hypopotassemia   . Localized osteoarthrosis not specified whether primary or secondary, lower leg   .  Lumbago   . Memory difficulties 04/22/2013  . Nocturia   . Osteoporosis, unspecified   . Other testicular hypofunction   . Parkinson disease ([place]) 05/16/2016  . Right inguinal hernia   . Sleep apnea    last sleep study 11/11 on chart- Bipap with settings of 4 per  patient  . Spinal stenosis, lumbar region, without neurogenic claudication   . Syncope and collapse   . Unspecified adverse effect of unspecified drug, medicinal and biological substance   . Unspecified gastritis and gastroduodenitis without mention of hemorrhage   . Urinary frequency    Past Surgical History:  Procedure Laterality Date  . BACK SURGERY     x 3  . [place]  . INGUINAL HERNIA REPAIR  07/19/2011   Procedure: LAPAROSCOPIC INGUINAL HERNIA;  Surgeon: Odis Hollingshead, MD;  Location: [facility]S;  Service: General;  Laterality: Right;  Laparoscopic Repair of  Recurrent Right  Ingunial Hernia with Mesh  . microdisectomy  03/02/2003, 06/29/2003, 12/24/2005  . TONSILLECTOMY     Family History  Problem Relation Age of Onset  . Colon cancer Mother   . Dementia Mother   . Parkinsonism Father 26  . Lung cancer Paternal Grandfather        smoker  . Liver disease Neg Hx   . Kidney disease Neg Hx   . Esophageal cancer Neg Hx    Social History   Socioeconomic History  . Marital status: Widowed    Spouse name: Not on file  . Number of children: 2  . Years of education: college  . Highest education level: Not on file  Occupational History  . Occupation: retired    Fish farm manager: RETIRED  Social Needs  . Financial resource strain: Not on file  . Food insecurity:    Worry: Not on file    Inability: Not on file  . Transportation needs:    Medical: Not on file    Non-medical: Not on file  Tobacco Use  . Smoking status: Never Smoker  . Smokeless tobacco: Never Used  Substance and Sexual Activity  . Alcohol use: No    Alcohol/week: 0.0 standard drinks    Comment: less than 5 per week  . Drug use: No  . Sexual activity: Not on file  Lifestyle  . Physical activity:    Days per week: Not on file    Minutes per session: Not on file  . Stress: Not on file  Relationships  . Social connections:    Talks on phone: Not on file    Gets together: Not on file    Attends religious service: Not on file    Active member of club or organization: Not on file    Attends meetings of clubs or organizations: Not on file    Relationship status: Not on file  Other Topics Concern  . Not on file  Social History Narrative   Lives at home   Patient is right handed.   Patient drinks 1 cup of caffeine per day.    Outpatient Encounter Medications as of 03/31/2018  Medication Sig  . amLODipine (NORVASC) 5 MG tablet Take 1 tablet (5 mg total) by mouth daily.  . Calcium Carbonate-Vitamin D (CALCIUM 600 + D PO) Take 1 tablet by mouth 2 (two) times daily.   .  carbidopa-levodopa (SINEMET IR) 25-100 MG tablet Take 2 tablets by mouth 3 (three) times daily.  . cholecalciferol (VITAMIN D) 1000 UNITS tablet Take 5,000 Units by mouth  daily.   . citalopram (CELEXA) 20 MG tablet Take 1 tablet (20 mg total) by mouth daily.  . diclofenac sodium (VOLTAREN) 1 % GEL Apply 2 g topically 4 (four) times daily.  Marland Kitchen KLOR-CON M20 20 MEQ tablet TAKE 3 TABLETS (60 MEQ TOTAL) BY MOUTH DAILY.  Marland Kitchen Krill Oil 1000 MG CAPS Take 1 capsule by mouth 2 (two) times daily.  Marland Kitchen losartan (COZAAR) 100 MG tablet TAKE 1 TABLET (100 MG TOTAL) BY MOUTH DAILY.  Marland Kitchen MAGNESIUM CITRATE PO Take 250 mg by mouth 2 (two) times daily.   . memantine (NAMENDA) 10 MG tablet TAKE 1 TABLET (10 MG TOTAL) BY MOUTH 2 (TWO) TIMES DAILY.  . metoprolol tartrate (LOPRESSOR) 25 MG tablet TAKE ONE TABLET ONCE PER DAY.  Marland Kitchen Nutritional Supplements (ANTI-OXIDANT COMPLEX PO) Take 10 mg by mouth daily.   . Probiotic Product (ALIGN) 4 MG CAPS Take by mouth daily.   . vitamin B-12 (CYANOCOBALAMIN) 50 MCG tablet Take 50 mcg by mouth daily.   . [DISCONTINUED] potassium chloride (K-DUR) 10 MEQ tablet Take 1 tablet (10 mEq total) by mouth daily. 1 twice a day for 3 days and then 1 every dat   No facility-administered encounter medications on file as of 03/31/2018.     Activities of Daily Living No flowsheet data found.  Patient Care Team: Eulas Post, MD as PCP - General (Family Medicine)    Assessment:   This is a routine wellness examination for [place].  Exercise Activities and Dietary recommendations    Goals    . patient     To stay independent / continue at home       Fall Risk Fall Risk  03/31/2018 03/28/2017 05/14/2016 03/28/2015 03/07/2014  Falls in the past year? Yes No No No Yes  Comment fell in march and broke a couple ribs  - - - -  Number falls in past yr: 2 or more - - - -  Comment mistep due to frozen; start suffling backwards. steps and hands were full; rollator - - - -  Injury with Fall? Yes  - - - -  Risk for fall due to : History of fall(s);Impaired balance/gait - - - Impaired balance/gait     Depression Screen PHQ 2/9 Scores 03/28/2017 05/14/2016 03/28/2015 03/07/2014  PHQ - 2 Score 0 0 0 1     Cognitive Function MMSE - Mini Mental State Exam 01/08/2018 06/03/2017 03/28/2017 01/27/2017 05/16/2016  Not completed: - - Refused - -  Orientation to time 4 5 - 4 5  Orientation to Place 5 5 - 5 5  Registration 3 3 - 3 3  Attention/ Calculation 2 4 - 5 5  Recall 2 3 - 3 3  Language- name 2 objects 2 2 - 2 2  Language- repeat 1 1 - 1 1  Language- follow 3 step command 3 3 - 3 3  Language- read & follow direction 1 1 - 1 1  Write a sentence 1 1 - 1 1  Copy design 1 1 - 0 1  Total score 25 29 - 28 30        Immunization History  Administered Date(s) Administered  . Influenza Whole 08/19/2005, 05/09/2008, 05/16/2009, 05/21/2010  . Influenza, High Dose Seasonal PF 05/14/2016, 04/25/2017  . Influenza,inj,Quad PF,6+ Mos 05/07/2013, 06/14/2014  . Influenza-Unspecified 06/02/2015  . Pneumococcal Conjugate-13 03/28/2017  . Pneumococcal Polysaccharide-23 03/07/2014  . Td 08/19/2001  . Tdap 03/07/2014  . Zoster 09/24/2013     Screening Tests  Health Maintenance  Topic Date Due  . COLONOSCOPY  02/03/2016  . INFLUENZA VACCINE  03/19/2018  . TETANUS/TDAP  03/07/2024  . Hepatitis C Screening  Completed  . PNA vac Low Risk Adult  Completed        Plan:      PCP Notes   Health Maintenance Educated regarding shingrix   Manuela Schwartz to call GI and check on fup apt or plan for future colonoscopy   Abnormal Screens  None  Deferred memory to neuro Adjusting to his new apt   Referrals  none  Patient concerns; Constipation; Miralax is the only thing [facility] will given him.  Maalox for heart burn - is trying it but does not work Was taking Zantac 75mg  po qd and mira lax prn every day for heart burn and constipation.  Dr. Elease Hashimoto agreed to order Zantac and miralax,  as well as added Calcium 600 mg with 400 of Vit D today and order noted and sent back with the patient to the facility   Nurse Concerns; HR 48 and checked radially today "states he feels weak at times"  Can't stand for long periods of time  Dr. Elease Hashimoto noted bradycardia periodically but no issues  Denies dizziness;   Bladder issues of urgency  Saw a urologist but not helpful   Next PCP apt 11/18 Dr. Jannifer [facility] 05/14/2018 Dr. Gwenlyn Found [facility] in June of next year      I have personally reviewed and noted the following in the patient's chart:   . Medical and social history . Use of alcohol, tobacco or illicit drugs  . Current medications and supplements . Functional ability and status . Nutritional status . Physical activity . Advanced directives . List of other physicians . Hospitalizations, surgeries, and ER visits in previous 12 months . Vitals . Screenings to include cognitive, depression, and falls . Referrals and appointments  In addition, I have reviewed and discussed with patient certain preventive protocols, quality metrics, and best practice recommendations. A written personalized care plan for preventive services as well as general preventive health recommendations were provided to patient.     DTOIZ,TIWPY, RN  03/31/2018  I have reviewed the documentation for the AWV and [place] provided by the health coach and agree with their documentation. I was immediately available for any questions  Eulas Post MD [facility]

## 2018-03-31 ENCOUNTER — Ambulatory Visit (INDEPENDENT_AMBULATORY_CARE_PROVIDER_SITE_OTHER): Payer: PPO

## 2018-03-31 VITALS — BP 130/70 | HR 47 | Ht 68.0 in | Wt 163.6 lb

## 2018-03-31 DIAGNOSIS — Z0000 Encounter for general adult medical examination without abnormal findings: Secondary | ICD-10-CM

## 2018-03-31 NOTE — Patient Instructions (Addendum)
Mr. Hynes , Thank you for taking time to come for your Medicare Wellness Visit. I appreciate your ongoing commitment to your health goals. Please review the following plan we discussed and let me know if I can assist you in the future.   Manuela Schwartz to call GI and check colonoscopy repeat   Will try to have an eye exam over the next few months  Not reading as well so may need new glasses   Shingrix is a vaccine for the prevention of Shingles in Adults 50 and older.  If you are on Medicare, the shingrix is covered under your Part D plan, so you will take both of the vaccines in the series at your pharmacy. Please check with your benefits regarding applicable copays or out of pocket expenses.  The Shingrix is given in 2 vaccines approx 8 weeks apart. You must receive the 2nd dose prior to 6 months from receipt of the first. Please have the pharmacist print out you Immunization  dates for our office records    These are the goals we discussed: Goals    . patient     To stay independent / continue at home       This is a list of the screening recommended for you and due dates:  Health Maintenance  Topic Date Due  . Colon Cancer Screening  02/03/2016  . Flu Shot  03/19/2018  . Tetanus Vaccine  03/07/2024  .  Hepatitis C: One time screening is recommended by Center for Disease Control  (CDC) for  adults born from [birth year] through 1966.   Completed  . Pneumonia vaccines  Completed    Constipation, Adult Constipation is when a person:  Poops (has a bowel movement) fewer times in a week than normal.  Has a hard time pooping.  Has poop that is dry, hard, or bigger than normal.  Follow these instructions at home: Eating and drinking   Eat foods that have a lot of fiber, such as: ? Fresh fruits and vegetables. ? Whole grains. ? Beans.  Eat less of foods that are high in fat, low in fiber, or overly processed, such as: ? [place] fries. ? Hamburgers. ? Cookies. ? Candy. ? Soda.  Drink  enough fluid to keep your pee (urine) clear or pale yellow. General instructions  Exercise regularly or as told by your doctor.  Go to the restroom when you feel like you need to poop. Do not hold it in.  Take over-the-counter and prescription medicines only as told by your doctor. These include any fiber supplements.  Do pelvic floor retraining exercises, such as: ? Doing deep breathing while relaxing your lower belly (abdomen). ? Relaxing your pelvic floor while pooping.  Watch your condition for any changes.  Keep all follow-up visits as told by your doctor. This is important. Contact a doctor if:  You have pain that gets worse.  You have a fever.  You have not pooped for 4 days.  You throw up (vomit).  You are not hungry.  You lose weight.  You are bleeding from the anus.  You have thin, pencil-like poop (stool). Get help right away if:  You have a fever, and your symptoms suddenly get worse.  You leak poop or have blood in your poop.  Your belly feels hard or bigger than normal (is bloated).  You have very bad belly pain.  You feel dizzy or you faint. This information is not intended to replace advice given to you  by your health care provider. Make sure you discuss any questions you have with your health care provider. Document Released: 01/22/2008 Document Revised: 02/23/2016 Document Reviewed: 01/24/2016 Elsevier Interactive Patient Education  2018 [place] in the Home Falls can cause injuries. They can happen to people of all ages. There are many things you can do to make your home safe and to help prevent falls. What can I do on the outside of my home?  Regularly fix the edges of walkways and driveways and fix any cracks.  Remove anything that might make you trip as you walk through a door, such as a raised step or threshold.  Trim any bushes or trees on the path to your home.  Use bright outdoor lighting.  Clear any  walking paths of anything that might make someone trip, such as rocks or tools.  Regularly check to see if handrails are loose or broken. Make sure that both sides of any steps have handrails.  Any raised decks and porches should have guardrails on the edges.  Have any leaves, snow, or ice cleared regularly.  Use sand or salt on walking paths during winter.  Clean up any spills in your garage right away. This includes oil or grease spills. What can I do in the bathroom?  Use night lights.  Install grab bars by the toilet and in the tub and shower. Do not use towel bars as grab bars.  Use non-skid mats or decals in the tub or shower.  If you need to sit down in the shower, use a plastic, non-slip stool.  Keep the floor dry. Clean up any water that spills on the floor as soon as it happens.  Remove soap buildup in the tub or shower regularly.  Attach bath mats securely with double-sided non-slip rug tape.  Do not have throw rugs and other things on the floor that can make you trip. What can I do in the bedroom?  Use night lights.  Make sure that you have a light by your bed that is easy to reach.  Do not use any sheets or blankets that are too big for your bed. They should not hang down onto the floor.  Have a firm chair that has side arms. You can use this for support while you get dressed.  Do not have throw rugs and other things on the floor that can make you trip. What can I do in the kitchen?  Clean up any spills right away.  Avoid walking on wet floors.  Keep items that you use a lot in easy-to-reach places.  If you need to reach something above you, use a strong step stool that has a grab bar.  Keep electrical cords out of the way.  Do not use floor polish or wax that makes floors slippery. If you must use wax, use non-skid floor wax.  Do not have throw rugs and other things on the floor that can make you trip. What can I do with my stairs?  Do not leave  any items on the stairs.  Make sure that there are handrails on both sides of the stairs and use them. Fix handrails that are broken or loose. Make sure that handrails are as long as the stairways.  Check any carpeting to make sure that it is firmly attached to the stairs. Fix any carpet that is loose or worn.  Avoid having throw rugs at the top or bottom of the  stairs. If you do have throw rugs, attach them to the floor with carpet tape.  Make sure that you have a light switch at the top of the stairs and the bottom of the stairs. If you do not have them, ask someone to add them for you. What else can I do to help prevent falls?  Wear shoes that: ? Do not have high heels. ? Have rubber bottoms. ? Are comfortable and fit you well. ? Are closed at the toe. Do not wear sandals.  If you use a stepladder: ? Make sure that it is fully opened. Do not climb a closed stepladder. ? Make sure that both sides of the stepladder are locked into place. ? Ask someone to hold it for you, if possible.  Clearly mark and make sure that you can see: ? Any grab bars or handrails. ? First and last steps. ? Where the edge of each step is.  Use tools that help you move around (mobility aids) if they are needed. These include: ? Canes. ? Walkers. ? Scooters. ? Crutches.  Turn on the lights when you go into a dark area. Replace any light bulbs as soon as they burn out.  Set up your furniture so you have a clear path. Avoid moving your furniture around.  If any of your floors are uneven, fix them.  If there are any pets around you, be aware of where they are.  Review your medicines with your doctor. Some medicines can make you feel dizzy. This can increase your chance of falling. Ask your doctor what other things that you can do to help prevent falls. This information is not intended to replace advice given to you by your health care provider. Make sure you discuss any questions you have with your  health care provider. Document Released: 06/01/2009 Document Revised: 01/11/2016 Document Reviewed: 09/09/2014 Elsevier Interactive Patient Education  2018 [place] Maintenance, Male A healthy lifestyle and preventive care is important for your health and wellness. Ask your health care provider about what schedule of regular examinations is right for you. What should I know about weight and diet? Eat a Healthy Diet  Eat plenty of vegetables, fruits, whole grains, low-fat dairy products, and lean protein.  Do not eat a lot of foods high in solid fats, added sugars, or salt.  Maintain a Healthy Weight Regular exercise can help you achieve or maintain a healthy weight. You should:  Do at least 150 minutes of exercise each week. The exercise should increase your heart rate and make you sweat (moderate-intensity exercise).  Do strength-training exercises at least twice a week.  Watch Your Levels of Cholesterol and Blood Lipids  Have your blood tested for lipids and cholesterol every 5 years starting at 73 years of age. If you are at high risk for heart disease, you should start having your blood tested when you are 73 years old. You may need to have your cholesterol levels checked more often if: ? Your lipid or cholesterol levels are high. ? You are older than 73 years of age. ? You are at high risk for heart disease.  What should I know about cancer screening? Many types of cancers can be detected early and may often be prevented. Lung Cancer  You should be screened every year for lung cancer if: ? You are a current smoker who has smoked for at least 30 years. ? You are a former smoker who has quit within the  past 15 years.  Talk to your health care provider about your screening options, when you should start screening, and how often you should be screened.  Colorectal Cancer  Routine colorectal cancer screening usually begins at 73 years of age and should be  repeated every 5-10 years until you are 73 years old. You may need to be screened more often if early forms of precancerous polyps or small growths are found. Your health care provider may recommend screening at an earlier age if you have risk factors for colon cancer.  Your health care provider may recommend using home test kits to check for hidden blood in the stool.  A small camera at the end of a tube can be used to examine your colon (sigmoidoscopy or colonoscopy). This checks for the earliest forms of colorectal cancer.  Prostate and Testicular Cancer  Depending on your age and overall health, your health care provider may do certain tests to screen for prostate and testicular cancer.  Talk to your health care provider about any symptoms or concerns you have about testicular or prostate cancer.  Skin Cancer  Check your skin from head to toe regularly.  Tell your health care provider about any new moles or changes in moles, especially if: ? There is a change in a mole's size, shape, or color. ? You have a mole that is larger than a pencil eraser.  Always use sunscreen. Apply sunscreen liberally and repeat throughout the day.  Protect yourself by wearing long sleeves, pants, a wide-brimmed hat, and sunglasses when outside.  What should I know about heart disease, diabetes, and high blood pressure?  If you are 44-56 years of age, have your blood pressure checked every 3-5 years. If you are 67 years of age or older, have your blood pressure checked every year. You should have your blood pressure measured twice-once when you are at a hospital or clinic, and once when you are not at a hospital or clinic. Record the average of the two measurements. To check your blood pressure when you are not at a hospital or clinic, you can use: ? An automated blood pressure machine at a pharmacy. ? A home blood pressure monitor.  Talk to your health care provider about your target blood  pressure.  If you are between 67-81 years old, ask your health care provider if you should take aspirin to prevent heart disease.  Have regular diabetes screenings by checking your fasting blood sugar level. ? If you are at a normal weight and have a low risk for diabetes, have this test once every three years after the age of 87. ? If you are overweight and have a high risk for diabetes, consider being tested at a younger age or more often.  A one-time screening for abdominal aortic aneurysm (AAA) by ultrasound is recommended for men aged 40-75 years who are current or former smokers. What should I know about preventing infection? Hepatitis B If you have a higher risk for hepatitis B, you should be screened for this virus. Talk with your health care provider to find out if you are at risk for hepatitis B infection. Hepatitis C Blood testing is recommended for:  Everyone born from [birth year] through 1966.  Anyone with known risk factors for hepatitis C.  Sexually Transmitted Diseases (STDs)  You should be screened each year for STDs including gonorrhea and chlamydia if: ? You are sexually active and are younger than 73 years of age. ? You are older  than 73 years of age and your health care provider tells you that you are at risk for this type of infection. ? Your sexual activity has changed since you were last screened and you are at an increased risk for chlamydia or gonorrhea. Ask your health care provider if you are at risk.  Talk with your health care provider about whether you are at high risk of being infected with HIV. Your health care provider may recommend a prescription medicine to help prevent HIV infection.  What else can I do?  Schedule regular health, dental, and eye exams.  Stay current with your vaccines (immunizations).  Do not use any tobacco products, such as cigarettes, chewing tobacco, and e-cigarettes. If you need help quitting, ask your health care  provider.  Limit alcohol intake to no more than 2 drinks per day. One drink equals 12 ounces of beer, 5 ounces of wine, or 1 ounces of hard liquor.  Do not use street drugs.  Do not share needles.  Ask your health care provider for help if you need support or information about quitting drugs.  Tell your health care provider if you often feel depressed.  Tell your health care provider if you have ever been abused or do not feel safe at home. This information is not intended to replace advice given to you by your health care provider. Make sure you discuss any questions you have with your health care provider. Document Released: 02/01/2008 Document Revised: 04/03/2016 Document Reviewed: 05/09/2015 Elsevier Interactive Patient Education  [employer].

## 2018-04-09 ENCOUNTER — Telehealth: Payer: Self-pay

## 2018-04-09 NOTE — Telephone Encounter (Signed)
Call to [facility] Mr. Wegener did get a "recall" letter but he states he did not receive notification of colonoscopy. GI will call him to make an apt. Wynetta Fines RN

## 2018-04-10 ENCOUNTER — Other Ambulatory Visit: Payer: Self-pay | Admitting: Family Medicine

## 2018-04-10 MED ORDER — CALCIUM CARBONATE-VITAMIN D 600-400 MG-UNIT PO TABS: 1.0000 | ORAL_TABLET | Freq: Every day | ORAL | 3 refills | Status: DC

## 2018-04-10 NOTE — Telephone Encounter (Signed)
Copied from [place] [id number]. Topic: Quick Communication - Rx Refill/Question >> Apr 10, 2018  9:23 AM Sheran Luz wrote: Medication:  Calcium Carbonate-Vitamin D (CALCIUM 600 + D PO) [[id number]]    [facility]armacist from [facility] called to request a refill for pt, stating that pt is in an assisted living facility.   Preferred Pharmacy (with phone number or street name): [facility] - [place], [place] - [place]  [phone] (Phone) [phone] (Fax)    Agent: Please be advised that RX refills may take up to 3 business days. We ask that you follow-up with your pharmacy.

## 2018-04-10 NOTE — Telephone Encounter (Signed)
Per 03/31/18 note- this was to be ordered. Historical medication  Calcium carbonate- Vitamin D 600 mg- 400 mg             Last  Ordered:07/09/11  LOV: 01/02/18  PCP: [place]: verified

## 2018-04-15 ENCOUNTER — Telehealth: Payer: Self-pay | Admitting: Family Medicine

## 2018-04-15 NOTE — Telephone Encounter (Signed)
Okay to fill? 

## 2018-04-15 NOTE — Telephone Encounter (Signed)
Request Rx for Zantac; not currently on active medication list Noted in AWV of 03/31/18 that nurse noted Dr. Elease Hashimoto agreed to order Zantac for the pt. LOV 01/02/18 Pharm: [facility] [place] [place]

## 2018-04-15 NOTE — Telephone Encounter (Signed)
Copied from [place] [id number]. Topic: Quick Communication - Rx Refill/Question >> Apr 15, 2018 12:29 PM Synthia Innocent wrote: Medication: zantac 75mg  1 tab daily  Has the patient contacted their pharmacy? Yes.   (Agent: If no, request that the patient contact the pharmacy for the refill.) (Agent: If yes, when and what did the pharmacy advise?)  Preferred Pharmacy (with phone number or street name): [facility]  Agent: Please be advised that RX refills may take up to 3 business days. We ask that you follow-up with your pharmacy.

## 2018-04-16 MED ORDER — RANITIDINE HCL 150 MG PO TABS: 150.0000 mg | ORAL_TABLET | Freq: Two times a day (BID) | ORAL | 1 refills | Status: DC

## 2018-04-16 NOTE — Telephone Encounter (Signed)
Yes   Zantac 150 mg po bid prn GERD symptoms.

## 2018-04-16 NOTE — Addendum Note (Signed)
Addended by: Westley Hummer B on: 04/16/2018 09:29 AM   Modules accepted: Orders

## 2018-05-14 ENCOUNTER — Telehealth: Payer: Self-pay | Admitting: *Deleted

## 2018-05-14 ENCOUNTER — Ambulatory Visit (INDEPENDENT_AMBULATORY_CARE_PROVIDER_SITE_OTHER): Payer: PPO | Admitting: Neurology

## 2018-05-14 ENCOUNTER — Encounter: Payer: Self-pay | Admitting: Neurology

## 2018-05-14 VITALS — BP 171/78 | HR 48 | Wt 164.0 lb

## 2018-05-14 DIAGNOSIS — G20 Parkinson's disease: Secondary | ICD-10-CM | POA: Diagnosis not present

## 2018-05-14 DIAGNOSIS — G20A1 Parkinson's disease without dyskinesia, without mention of fluctuations: Secondary | ICD-10-CM

## 2018-05-14 MED ORDER — CARBIDOPA-LEVODOPA ER 50-200 MG PO TBCR: 1.0000 | EXTENDED_RELEASE_TABLET | Freq: Three times a day (TID) | ORAL | 1 refills | Status: DC

## 2018-05-14 NOTE — Patient Instructions (Signed)
We will stop the Sinemet IR 25/100 mg tablet, start Sinemet CR 50/200 mg tablets., one three times a day.  Call if there continue to be cognitive issues after the next 3 or 4 weeks.

## 2018-05-14 NOTE — Telephone Encounter (Signed)
[name] called and LVM letting him know we scheduled him for 09/15/18 at 12pm, check in 1130am with Dr. Jannifer [place] for 4 month f/u. He was instructed to call back if this does not work.

## 2018-05-14 NOTE — Telephone Encounter (Signed)
Patient was seen today and Dr. Jannifer [place] requested he f/u in 4 months.   The next available is more that 6 months out and patient would prefer to not wait that long.  Is there anything available that the RN could offer closer to the requested time?  Please call.

## 2018-05-14 NOTE — Progress Notes (Signed)
Reason for visit: Parkinson's disease  Zaeem Kandel. is an 72 y.o. male  History of present illness:  Mr. Haymer is a 72 year old right-handed white male with a history of Parkinson's disease and an associated memory disorder.  The patient has chronic low back pain, he is not currently followed through a pain center.  The patient is now using a walker for ambulation.  He was increased on his Sinemet dosing when last seen in May 2019, within 3 weeks he began having increased problems with confusion but the family never contacted our office.  The patient is having regular vivid dreams at night, he will wake up and cannot distinguish reality from the dream, he dreams that his bathroom is under construction and he cannot get into the bathroom.  This is a recurring dream at night.  He is not having hallucinations during the day.  He is having some increasing problems with walking and balance, he has fallen 3-4 times, the last fall was yesterday when he was trying to get out of the shower.  The patient has not hit his head with the falls.  He does have chronic urinary incontinence.  He has had some falls with increased confusion and agitation in the evening hours.  The patient may feel lightheaded if he is up standing for long periods of time.  He remains hypertensive.  He returns to this office for an evaluation.   Past Medical History:  Diagnosis Date  . Abdominal pain, right lower quadrant   . Abnormality of gait 05/16/2016  . Acute prostatitis   . Anxiety   . Constipation   . Degenerative disc disease   . Depression   . Diverticulosis of colon (without mention of hemorrhage)   . Essential and other specified forms of tremor   . Family history of malignant neoplasm of gastrointestinal tract   . Fibromyalgia   . Ganglion and cyst of synovium, tendon, and bursa   . GERD (gastroesophageal reflux disease)   . Hemarthrosis, upper arm   . Hyperlipidemia   . Hypersomnia with sleep apnea,  unspecified   . Hypertension   . Hypopotassemia   . Localized osteoarthrosis not specified whether primary or secondary, lower leg   . Lumbago   . Memory difficulties 04/22/2013  . Nocturia   . Osteoporosis, unspecified   . Other testicular hypofunction   . Parkinson disease ([place]) 05/16/2016  . Right inguinal hernia   . Sleep apnea    last sleep study 11/11 on chart- Bipap with settings of 4 per  patient  . Spinal stenosis, lumbar region, without neurogenic claudication   . Syncope and collapse   . Unspecified adverse effect of unspecified drug, medicinal and biological substance   . Unspecified gastritis and gastroduodenitis without mention of hemorrhage   . Urinary frequency     Past Surgical History:  Procedure Laterality Date  . BACK SURGERY     x 3  . [place]  . INGUINAL HERNIA REPAIR  07/19/2011   Procedure: LAPAROSCOPIC INGUINAL HERNIA;  Surgeon: Odis Hollingshead, MD;  Location: [facility];  Service: General;  Laterality: Right;  Laparoscopic Repair of Recurrent Right  Ingunial Hernia with Mesh  . microdisectomy  03/02/2003, 06/29/2003, 12/24/2005  . TONSILLECTOMY      Family History  Problem Relation Age of Onset  . Colon cancer Mother   . Dementia Mother   . Parkinsonism Father 34  . Lung cancer Paternal Grandfather  smoker  . Liver disease Neg Hx   . Kidney disease Neg Hx   . Esophageal cancer Neg Hx     Social history:  reports that he has never smoked. He has never used smokeless tobacco. He reports that he does not drink alcohol or use drugs.    Allergies  Allergen Reactions  . Nucynta [Tapentadol] Other (See Comments)    dizziness  . Pregabalin     REACTION: dizziness and mental status changes  . Exelon [Rivastigmine]     abd pain    Medications:  Prior to Admission medications   Medication Sig Start Date End Date Taking? Authorizing Provider  amLODipine (NORVASC) 5 MG tablet Take 1 tablet (5 mg total) by mouth daily. 12/02/17    Burchette, Alinda Sierras, MD  Calcium Carbonate-Vitamin D (CALCIUM 600+D) 600-400 MG-UNIT tablet Take 1 tablet by mouth daily. 04/10/18   Burchette, Alinda Sierras, MD  carbidopa-levodopa (SINEMET IR) 25-100 MG tablet Take 2 tablets by mouth 3 (three) times daily. 01/08/18   Kathrynn Ducking, MD  cholecalciferol (VITAMIN D) 1000 UNITS tablet Take 5,000 Units by mouth daily.     [provider]  citalopram (CELEXA) 20 MG tablet Take 1 tablet (20 mg total) by mouth daily. 02/03/18   Burchette, Alinda Sierras, MD  diclofenac sodium (VOLTAREN) 1 % GEL Apply 2 g topically 4 (four) times daily. 12/04/16   Mcarthur Rossetti, MD  KLOR-CON M20 20 MEQ tablet TAKE 3 TABLETS (60 MEQ TOTAL) BY MOUTH DAILY. 01/02/18   Burchette, Alinda Sierras, MD  Krill Oil 1000 MG CAPS Take 1 capsule by mouth 2 (two) times daily.    [provider]  losartan (COZAAR) 100 MG tablet TAKE 1 TABLET (100 MG TOTAL) BY MOUTH DAILY. 09/16/17   Burchette, Alinda Sierras, MD  MAGNESIUM CITRATE PO Take 250 mg by mouth 2 (two) times daily.     [provider]  memantine (NAMENDA) 10 MG tablet TAKE 1 TABLET (10 MG TOTAL) BY MOUTH 2 (TWO) TIMES DAILY. 12/08/17   Kathrynn Ducking, MD  metoprolol tartrate (LOPRESSOR) 25 MG tablet TAKE ONE TABLET ONCE PER DAY. 01/07/18   Burchette, Alinda Sierras, MD  Nutritional Supplements (ANTI-OXIDANT COMPLEX PO) Take 10 mg by mouth daily.     [provider]  Probiotic Product (ALIGN) 4 MG CAPS Take by mouth daily.     [provider]  ranitidine (ZANTAC) 150 MG tablet Take 1 tablet (150 mg total) by mouth 2 (two) times daily. 04/16/18   Burchette, Alinda Sierras, MD  vitamin B-12 (CYANOCOBALAMIN) 50 MCG tablet Take 50 mcg by mouth daily.     [provider]  potassium chloride (K-DUR) 10 MEQ tablet Take 1 tablet (10 mEq total) by mouth daily. 1 twice a day for 3 days and then 1 every dat 07/06/12 02/24/18  Ricard Dillon, MD    ROS:  Out of a complete 14 system review of symptoms, the patient  complains only of the following symptoms, and all other reviewed systems are negative.  Incontinence of the bladder, frequency of urination Back pain, walking difficulty Daytime sleepiness Bruising easily Memory loss, dizziness, speech difficulty Agitation, confusion, decreased concentration, depression, anxiety, hallucinations  Blood pressure (!) 171/78, pulse (!) 48, weight 164 lb (74.4 kg).  Blood pressure, right arm, standing is 164/80.  Physical Exam  General: The patient is alert and cooperative at the time of the examination.  Skin: No significant peripheral edema is noted.   Neurologic Exam  Mental status: The patient is alert and oriented x 3 at the time of the examination. The Mini-Mental status examination done today shows a total score of 24/30.   Cranial nerves: Facial symmetry is present. Speech is normal, no aphasia or dysarthria is noted. Extraocular movements are full. Visual fields are full.  Masking of the face is seen.  Motor: The patient has good strength in all 4 extremities.  Sensory examination: Soft touch sensation is symmetric on the face, arms, and legs.  Coordination: The patient has good finger-nose-finger and heel-to-shin bilaterally.  Gait and station: The patient requires some assistance with standing.  Once up, he is able to ambulate independently, he has some hesitation with turns, decreased arm swing.  He is able to perform tandem gait, but this is slightly unsteady.  Romberg is negative.  He usually walks with a walker.  Reflexes: Deep tendon reflexes are symmetric.   Assessment/Plan:  1.  Parkinson's disease  2.  Gait disturbance  3.  Memory disturbance  The patient has had worsening confusion on increased dose of the Sinemet.  The patient will be switched to the CR tablet taking the 50/200 mg tablet, 1 tablet 3 times daily.  The patient will try to remain active during the day.  If the confusion continues, we may add low-dose Seroquel  at night.  The patient will follow-up in 4 months.  Jill Alexanders MD 05/14/2018 10:28 AM  [facility] [address] [place],  [address]  Phone [phone] Fax [phone]

## 2018-06-03 ENCOUNTER — Other Ambulatory Visit: Payer: Self-pay | Admitting: Neurology

## 2018-06-25 ENCOUNTER — Telehealth: Payer: Self-pay | Admitting: Neurology

## 2018-06-25 MED ORDER — CARBIDOPA-LEVODOPA 25-100 MG PO TABS: ORAL_TABLET | ORAL | 2 refills | Status: DC

## 2018-06-25 NOTE — Telephone Encounter (Signed)
I called the son. The patient has worsened with the confusion and delusional thinking. We will D/C the Sinemet CR and restart sinemet 25/100 taking 1.5 tablet in the morning and 1 at midday and evening.  Orders will be faxed over at [phone].  The patient resides at [place], [place].  The telephone number there is [phone].

## 2018-06-25 NOTE — Addendum Note (Signed)
Addended by: Kathrynn Ducking on: 06/25/2018 05:35 PM   Modules accepted: Orders

## 2018-06-25 NOTE — Telephone Encounter (Signed)
Pt son(on DPR-Kitzmiller,Jason) has called to inform that since pt last seen the hallucinations have worsen and pt is very agitated as a result of state of confusion.  Son is asking for a call from Dr Jannifer Franklin to discuss

## 2018-06-30 ENCOUNTER — Telehealth: Payer: Self-pay | Admitting: Family Medicine

## 2018-06-30 NOTE — Telephone Encounter (Signed)
I feel he  Should be seen.  If they cannot get him here can do UA and culture if indicated

## 2018-06-30 NOTE — Telephone Encounter (Signed)
Copied from [place] [id number]. Topic: Quick Communication - See Telephone Encounter >> Jun 30, 2018  4:23 PM Antonieta Iba C wrote: CRM for notification. See Telephone encounter for: 06/30/18.  CB: [phone] - [name]   Pt's son called in, patient has a possible uti, son declined apt but states that he would like to know if provider could put in order to have lab?   Please advise.

## 2018-06-30 NOTE — Telephone Encounter (Signed)
Please see message.  Please advise. 

## 2018-07-01 ENCOUNTER — Ambulatory Visit (INDEPENDENT_AMBULATORY_CARE_PROVIDER_SITE_OTHER): Payer: PPO | Admitting: Family Medicine

## 2018-07-01 ENCOUNTER — Encounter: Payer: Self-pay | Admitting: Family Medicine

## 2018-07-01 ENCOUNTER — Other Ambulatory Visit: Payer: Self-pay

## 2018-07-01 VITALS — BP 116/70 | HR 43 | Temp 97.9°F | Wt 162.8 lb

## 2018-07-01 DIAGNOSIS — G20 Parkinson's disease: Secondary | ICD-10-CM | POA: Diagnosis not present

## 2018-07-01 DIAGNOSIS — R350 Frequency of micturition: Secondary | ICD-10-CM

## 2018-07-01 DIAGNOSIS — R410 Disorientation, unspecified: Secondary | ICD-10-CM | POA: Diagnosis not present

## 2018-07-01 LAB — POCT URINALYSIS DIPSTICK
BILIRUBIN UA: NEGATIVE
Blood, UA: POSITIVE
GLUCOSE UA: NEGATIVE
Ketones, UA: NEGATIVE
Leukocytes, UA: NEGATIVE
Nitrite, UA: NEGATIVE
PROTEIN UA: POSITIVE — AB
Spec Grav, UA: 1.015 (ref 1.010–1.025)
Urobilinogen, UA: 0.2 E.U./dL
pH, UA: 6 (ref 5.0–8.0)

## 2018-07-01 NOTE — Progress Notes (Signed)
Subjective:     Patient ID: [name]., male   DOB: Sep 06, 1945, 72 y.o.   MRN: [id number]  HPI Patient is seen accompanied by son with concerns for possible UTI and also some recent nightmares and concern for possible delusions/delirium.Marland Kitchen  He has Parkinson's disease and many other chronic problems including hypertension, osteoporosis, lumbar stenosis, essential tremor, history of urinary urgency.  He has seen urologist in the past and apparently has been tried on multiple medications for urgency without success.  He denies burning with urination.  He has fairly extreme urgency.  He has frequent leakage of urine and uses Depends night and day.  No recent fevers or chills.  No nausea or vomiting.  No flank pain.  No gross hematuria.  No slow stream.  He has had frequent nightmares and son states when he wakes up he has a hard time sorting out reality with his dreams.  Neurologist was informed last week about his symptoms and they reduced his Sinemet dosage somewhat.  No recent slurred speech or any focal weakness.  Past Medical History:  Diagnosis Date  . Abdominal pain, right lower quadrant   . Abnormality of gait 05/16/2016  . Acute prostatitis   . Anxiety   . Constipation   . Degenerative disc disease   . Depression   . Diverticulosis of colon (without mention of hemorrhage)   . Essential and other specified forms of tremor   . Family history of malignant neoplasm of gastrointestinal tract   . Fibromyalgia   . Ganglion and cyst of synovium, tendon, and bursa   . GERD (gastroesophageal reflux disease)   . Hemarthrosis, upper arm   . Hyperlipidemia   . Hypersomnia with sleep apnea, unspecified   . Hypertension   . Hypopotassemia   . Localized osteoarthrosis not specified whether primary or secondary, lower leg   . Lumbago   . Memory difficulties 04/22/2013  . Nocturia   . Osteoporosis, unspecified   . Other testicular hypofunction   . Parkinson disease ([place]) 05/16/2016  . Right  inguinal hernia   . Sleep apnea    last sleep study 11/11 on chart- Bipap with settings of 4 per  patient  . Spinal stenosis, lumbar region, without neurogenic claudication   . Syncope and collapse   . Unspecified adverse effect of unspecified drug, medicinal and biological substance   . Unspecified gastritis and gastroduodenitis without mention of hemorrhage   . Urinary frequency    Past Surgical History:  Procedure Laterality Date  . BACK SURGERY     x 3  . [place]  . INGUINAL HERNIA REPAIR  07/19/2011   Procedure: LAPAROSCOPIC INGUINAL HERNIA;  Surgeon: Odis Hollingshead, MD;  Location: [facility];  Service: General;  Laterality: Right;  Laparoscopic Repair of Recurrent Right  Ingunial Hernia with Mesh  . microdisectomy  03/02/2003, 06/29/2003, 12/24/2005  . TONSILLECTOMY      reports that he has never smoked. He has never used smokeless tobacco. He reports that he does not drink alcohol or use drugs. family history includes Colon cancer in his mother; Dementia in his mother; Lung cancer in his paternal grandfather; Parkinsonism (age of onset: 33) in his father. Allergies  Allergen Reactions  . Nucynta [Tapentadol] Other (See Comments)    dizziness  . Pregabalin     REACTION: dizziness and mental status changes  . Exelon [Rivastigmine]     abd pain     Review of Systems  Constitutional: Negative  for chills and fever.  Respiratory: Negative for cough and shortness of breath.   Cardiovascular: Negative for chest pain.  Gastrointestinal: Negative for abdominal pain.  Genitourinary: Positive for urgency. Negative for difficulty urinating, dysuria and hematuria.  Neurological: Negative for dizziness and headaches.  Psychiatric/Behavioral: Positive for confusion and sleep disturbance.       Objective:   Physical Exam  Constitutional: He appears well-developed and well-nourished.  Cardiovascular: Normal rate and regular rhythm.  Pulmonary/Chest: Effort normal and  breath sounds normal.  Musculoskeletal: He exhibits no edema.  Neurological: He is alert. No cranial nerve deficit.       Assessment:     #1 long-standing issue of urinary urgency.  He has seen urology and has had full work-up and has not responded to multiple medications.  Rule out UTI  #2 recent increased confusion/abnormal dreams.  Difficult to sort out whether medication related (Sinemet) vs other etiology for delirium.  He has not had any fever or other obvious indicators of infection.    Plan:     -Check urine dipstick-shows only trace blood but negative leukocytes and negative nitrites -We will culture urine to be safe-but doubt UTI -Check other labs with CBC, basic metabolic panel, magnesium level -Continue close follow-up with neurology regarding his Parkinson's medications  Eulas Post MD [facility]

## 2018-07-01 NOTE — Telephone Encounter (Signed)
Called son [name] and have made an appointment today at 3pm for UTI and having nightmares. Also kept appointment for 07/06/18 in case patient needs a follow up. Will cancel if this appt is not needed.

## 2018-07-02 LAB — COMPREHENSIVE METABOLIC PANEL
ALBUMIN: 4.2 g/dL (ref 3.5–5.2)
ALT: 2 U/L (ref 0–53)
AST: 11 U/L (ref 0–37)
Alkaline Phosphatase: 94 U/L (ref 39–117)
BILIRUBIN TOTAL: 0.5 mg/dL (ref 0.2–1.2)
BUN: 21 mg/dL (ref 6–23)
CALCIUM: 9.4 mg/dL (ref 8.4–10.5)
CO2: 31 mEq/L (ref 19–32)
Chloride: 101 mEq/L (ref 96–112)
Creatinine, Ser: 1.03 mg/dL (ref 0.40–1.50)
GFR: 75.15 mL/min (ref >60.00)
GLUCOSE: 102 mg/dL — AB (ref 70–99)
POTASSIUM: 4.1 meq/L (ref 3.5–5.1)
SODIUM: 140 meq/L (ref 135–145)
TOTAL PROTEIN: 6.7 g/dL (ref 6.0–8.3)

## 2018-07-02 LAB — CBC WITH DIFFERENTIAL/PLATELET
Basophils Absolute: 0.1 10*3/uL (ref 0.0–0.1)
Basophils Relative: 0.9 % (ref 0.0–3.0)
EOS PCT: 0.8 % (ref 0.0–5.0)
Eosinophils Absolute: 0.1 10*3/uL (ref 0.0–0.7)
HCT: 39.3 % (ref 39.0–52.0)
Hemoglobin: 13.6 g/dL (ref 13.0–17.0)
LYMPHS PCT: 21.2 % (ref 12.0–46.0)
Lymphs Abs: 1.3 10*3/uL (ref 0.7–4.0)
MCHC: 34.7 g/dL (ref 30.0–36.0)
MCV: 93.7 fl (ref 78.0–100.0)
MONO ABS: 0.7 10*3/uL (ref 0.1–1.0)
MONOS PCT: 11.5 % (ref 3.0–12.0)
Neutro Abs: 3.9 10*3/uL (ref 1.4–7.7)
Neutrophils Relative %: 65.6 % (ref 43.0–77.0)
Platelets: 178 10*3/uL (ref 150.0–400.0)
RBC: 4.19 Mil/uL — AB (ref 4.22–5.81)
RDW: 13.4 % (ref 11.5–15.5)
WBC: 6 10*3/uL (ref 4.0–10.5)

## 2018-07-02 LAB — URINE CULTURE
MICRO NUMBER: 91367724
SPECIMEN QUALITY:: ADEQUATE

## 2018-07-02 LAB — MAGNESIUM: Magnesium: 2.3 mg/dL (ref 1.5–2.5)

## 2018-07-06 ENCOUNTER — Telehealth: Payer: Self-pay

## 2018-07-06 ENCOUNTER — Ambulatory Visit: Payer: PPO | Admitting: Family Medicine

## 2018-07-06 ENCOUNTER — Telehealth: Payer: Self-pay | Admitting: Family Medicine

## 2018-07-06 NOTE — Telephone Encounter (Signed)
Called patients son [name] and gave him lab results. Please see message in lab results note.

## 2018-07-06 NOTE — Telephone Encounter (Signed)
Called patients son [name], wanted to know how his dad is doing and if he needed to come to appointment today?  If okay, no need to come in if he is okay. Or if he is still having symptoms or needs to be seen he can keep appointment.  OK for [facility] to discuss or adjust schedule.  CRM Created.

## 2018-07-06 NOTE — Telephone Encounter (Signed)
Copied from [place] [id number]. Topic: General - Other >> Jul 06, 2018  9:06 AM Alfredia Ferguson R wrote: Patients son is calling for lab results

## 2018-07-31 ENCOUNTER — Other Ambulatory Visit: Payer: Self-pay | Admitting: Family Medicine

## 2018-08-26 DIAGNOSIS — H2513 Age-related nuclear cataract, bilateral: Secondary | ICD-10-CM | POA: Diagnosis not present

## 2018-08-29 DIAGNOSIS — W19XXXA Unspecified fall, initial encounter: Secondary | ICD-10-CM | POA: Diagnosis not present

## 2018-08-29 DIAGNOSIS — G20 Parkinson's disease: Secondary | ICD-10-CM | POA: Diagnosis not present

## 2018-08-29 DIAGNOSIS — Y92009 Unspecified place in unspecified non-institutional (private) residence as the place of occurrence of the external cause: Secondary | ICD-10-CM | POA: Diagnosis not present

## 2018-08-29 DIAGNOSIS — M25511 Pain in right shoulder: Secondary | ICD-10-CM | POA: Diagnosis not present

## 2018-09-04 DIAGNOSIS — R293 Abnormal posture: Secondary | ICD-10-CM | POA: Diagnosis not present

## 2018-09-04 DIAGNOSIS — R531 Weakness: Secondary | ICD-10-CM | POA: Diagnosis not present

## 2018-09-04 DIAGNOSIS — M6281 Muscle weakness (generalized): Secondary | ICD-10-CM | POA: Diagnosis not present

## 2018-09-04 DIAGNOSIS — R2681 Unsteadiness on feet: Secondary | ICD-10-CM | POA: Diagnosis not present

## 2018-09-04 DIAGNOSIS — G20 Parkinson's disease: Secondary | ICD-10-CM | POA: Diagnosis not present

## 2018-09-15 ENCOUNTER — Encounter: Payer: Self-pay | Admitting: Neurology

## 2018-09-15 ENCOUNTER — Telehealth: Payer: Self-pay | Admitting: Neurology

## 2018-09-15 ENCOUNTER — Ambulatory Visit: Payer: PPO | Admitting: Neurology

## 2018-09-15 VITALS — BP 174/74 | HR 46 | Ht 68.0 in | Wt 162.0 lb

## 2018-09-15 DIAGNOSIS — G20 Parkinson's disease: Secondary | ICD-10-CM

## 2018-09-15 DIAGNOSIS — G8929 Other chronic pain: Secondary | ICD-10-CM

## 2018-09-15 DIAGNOSIS — R413 Other amnesia: Secondary | ICD-10-CM

## 2018-09-15 DIAGNOSIS — G4489 Other headache syndrome: Secondary | ICD-10-CM

## 2018-09-15 DIAGNOSIS — M545 Low back pain: Secondary | ICD-10-CM

## 2018-09-15 MED ORDER — ENTACAPONE 200 MG PO TABS: 200.0000 mg | ORAL_TABLET | Freq: Three times a day (TID) | ORAL | 3 refills | Status: DC

## 2018-09-15 NOTE — Progress Notes (Signed)
Reason for visit: Parkinson's disease, gait disorder, memory disorder  [name]. is an 73 y.o. male  History of present illness:  [name] is a 73 year old right-handed white male with a history of Parkinson's disease.  The patient has not been able to get to higher doses of Sinemet secondary to cognitive side effects.  The patient is not sleeping well at night because he has to get up 6 or 7 times at night to use the bathroom.  He sleeps off and on throughout the day.  He does have hallucinations at times, elevating the Sinemet dose worsens this.  He has had 4 falls since last seen, the last fall was about 2 weeks ago.  The patient has had some right shoulder pain since that time, and some increased low back pain.  The patient did bump his head with 1 of the falls, he is having frequent headaches at this point.  The patient denies any problems with swallowing or choking.  He does have underlying memory problems, he is having increasing problems with word finding.  He is on Namenda for the memory.  The patient currently is residing in [place].  Most of the falls that he had was when he was not using his walker, with a walker he has good gait stability.  He is having increasing problems with standing from a seated position.  Past Medical History:  Diagnosis Date  . Abdominal pain, right lower quadrant   . Abnormality of gait 05/16/2016  . Acute prostatitis   . Anxiety   . Constipation   . Degenerative disc disease   . Depression   . Diverticulosis of colon (without mention of hemorrhage)   . Essential and other specified forms of tremor   . Family history of malignant neoplasm of gastrointestinal tract   . Fibromyalgia   . Ganglion and cyst of synovium, tendon, and bursa   . GERD (gastroesophageal reflux disease)   . Hemarthrosis, upper arm   . Hyperlipidemia   . Hypersomnia with sleep apnea, unspecified   . Hypertension   . Hypopotassemia   . Localized osteoarthrosis not  specified whether primary or secondary, lower leg   . Lumbago   . Memory difficulties 04/22/2013  . Nocturia   . Osteoporosis, unspecified   . Other testicular hypofunction   . Parkinson disease ([place]) 05/16/2016  . Right inguinal hernia   . Sleep apnea    last sleep study 11/11 on chart- Bipap with settings of 4 per  patient  . Spinal stenosis, lumbar region, without neurogenic claudication   . Syncope and collapse   . Unspecified adverse effect of unspecified drug, medicinal and biological substance   . Unspecified gastritis and gastroduodenitis without mention of hemorrhage   . Urinary frequency     Past Surgical History:  Procedure Laterality Date  . BACK SURGERY     x 3  . [place]  . INGUINAL HERNIA REPAIR  07/19/2011   Procedure: LAPAROSCOPIC INGUINAL HERNIA;  Surgeon: Odis Hollingshead, MD;  Location: [facility];  Service: General;  Laterality: Right;  Laparoscopic Repair of Recurrent Right  Ingunial Hernia with Mesh  . microdisectomy  03/02/2003, 06/29/2003, 12/24/2005  . TONSILLECTOMY      Family History  Problem Relation Age of Onset  . Colon cancer Mother   . Dementia Mother   . Parkinsonism Father 75  . Lung cancer Paternal Grandfather        smoker  .  Liver disease Neg Hx   . Kidney disease Neg Hx   . Esophageal cancer Neg Hx     Social history:  reports that he has never smoked. He has never used smokeless tobacco. He reports that he does not drink alcohol or use drugs.    Allergies  Allergen Reactions  . Nucynta [Tapentadol] Other (See Comments)    dizziness  . Pregabalin     REACTION: dizziness and mental status changes  . Exelon [Rivastigmine]     abd pain    Medications:  Prior to Admission medications   Medication Sig Start Date End Date Taking? Authorizing Provider  amLODipine (NORVASC) 5 MG tablet TAKE (1) TABLET BY MOUTH ONCE DAILY. 08/03/18   Burchette, Alinda Sierras, MD  Calcium Carbonate-Vitamin D (CALCIUM 600+D) 600-400 MG-UNIT  tablet Take 1 tablet by mouth daily. 04/10/18   Burchette, Alinda Sierras, MD  carbidopa-levodopa (SINEMET IR) 25-100 MG tablet 1.5 tablets in the morning, 1 tablet at midday and in the evening 06/25/18   Kathrynn Ducking, MD  cholecalciferol (VITAMIN D) 1000 UNITS tablet Take 5,000 Units by mouth daily.     [provider]  citalopram (CELEXA) 20 MG tablet Take 1 tablet (20 mg total) by mouth daily. 02/03/18   Burchette, Alinda Sierras, MD  KLOR-CON M20 20 MEQ tablet TAKE 3 TABLETS (60 MEQ TOTAL) BY MOUTH DAILY. 01/02/18   Burchette, Alinda Sierras, MD  Krill Oil 1000 MG CAPS Take 1 capsule by mouth 2 (two) times daily.    [provider]  losartan (COZAAR) 100 MG tablet TAKE 1 TABLET (100 MG TOTAL) BY MOUTH DAILY. 09/16/17   Burchette, Alinda Sierras, MD  MAGNESIUM CITRATE PO Take 250 mg by mouth 2 (two) times daily.     [provider]  memantine (NAMENDA) 10 MG tablet TAKE 1 TABLET (10 MG TOTAL) BY MOUTH 2 (TWO) TIMES DAILY. 12/08/17   Kathrynn Ducking, MD  metoprolol tartrate (LOPRESSOR) 25 MG tablet TAKE ONE TABLET ONCE PER DAY. 01/07/18   Burchette, Alinda Sierras, MD  Probiotic Product (ALIGN) 4 MG CAPS Take by mouth daily.     [provider]  ranitidine (ZANTAC) 150 MG tablet Take 1 tablet (150 mg total) by mouth 2 (two) times daily. 04/16/18   Burchette, Alinda Sierras, MD  vitamin B-12 (CYANOCOBALAMIN) 50 MCG tablet Take 50 mcg by mouth daily.     [provider]    ROS:  Out of a complete 14 system review of symptoms, the patient complains only of the following symptoms, and all other reviewed systems are negative.  Incontinence of the bladder, frequency of urination, urinary urgency Back pain, walking difficulty Daytime sleepiness Headache Confusion, decreased concentration, hallucination  Blood pressure (!) 174/74, pulse (!) 46, height 5\' 8"  (1.727 m), weight 162 lb (73.5 kg).  Physical Exam  General: The patient is alert and cooperative at the time of the  examination.  Skin: No significant peripheral edema is noted.   Neurologic Exam  Mental status: The patient is alert and oriented x 3 at the time of the examination. The Mini-Mental status examination done today shows a total score 24/30.   Cranial nerves: Facial symmetry is present. Speech is slightly hypophonic, not a phasic. Extraocular movements are full. Visual fields are full.  Masking the face is seen.  Motor: The patient has good strength in all 4 extremities.  Sensory examination: Soft touch sensation is symmetric on the face, arms, and legs.  Coordination: The patient has good  finger-nose-finger and heel-to-shin bilaterally.  Gait and station: The patient requires assistance with standing.  Once up, he can walk fairly well with a walker.  He has good stride, fairly good turns with a walker.  Tandem gait was not attempted.  Romberg is negative.  Reflexes: Deep tendon reflexes are symmetric.   Assessment/Plan:  1.  Parkinson's disease  2.  Urinary urgency and frequency  3.  Gait disorder, multiple falls  4.  Memory disorder  5.  Hallucinations  The patient has not been able to tolerate higher doses of Sinemet.  He will remain on his current dose of Sinemet taking the 25/100 mg tablet, 1.5 tablets in the morning and 1 tablet at midday and evening.  The patient will have Comtan added to this taking 200 mg 3 times daily.  The patient was given a prescription for a lift chair.  In the future, we may need to add Seroquel for the hallucinations.  He will follow-up in 4 months.  He is to use the walker at all times when ambulating.  We will get a CT scan of the brain given the headaches following the fall, and x-ray of the low back.  He hurt his right shoulder, this has already been x-rayed.  Jill Alexanders MD 09/15/2018 12:34 PM  [facility] [address] [place], [place] [address]  Phone [phone] Fax [phone]

## 2018-09-15 NOTE — Telephone Encounter (Signed)
Re faxed recommendations from visit today fax # [phone] He will remain on his current dose of Sinemet taking the 25/100 mg tablet, 1.5 tablets in the morning and 1 tablet at midday and evening.  The patient will have Comtan added to this taking 200 mg 3 times daily.  The patient was given a prescription for a lift chair.  Confirmation received.

## 2018-09-15 NOTE — Telephone Encounter (Signed)
Butch Penny from [facility] called and states that the orders for the pt are to light to read and understand. Please advise.

## 2018-09-15 NOTE — Patient Instructions (Signed)
We will add Comtan to take with the carbidopa tablets.

## 2018-09-16 ENCOUNTER — Telehealth: Payer: Self-pay | Admitting: Neurology

## 2018-09-16 NOTE — Telephone Encounter (Signed)
Health team order sent to GI. No auth they will reach out to the pt to schedule.  °

## 2018-09-16 NOTE — Telephone Encounter (Signed)
lvm for pt to be aware I left GI phone number of [phone] and to give them a call in the next 2-3 business days.

## 2018-09-21 DIAGNOSIS — R2681 Unsteadiness on feet: Secondary | ICD-10-CM | POA: Diagnosis not present

## 2018-09-21 DIAGNOSIS — G20 Parkinson's disease: Secondary | ICD-10-CM | POA: Diagnosis not present

## 2018-09-21 DIAGNOSIS — M6281 Muscle weakness (generalized): Secondary | ICD-10-CM | POA: Diagnosis not present

## 2018-09-21 DIAGNOSIS — R531 Weakness: Secondary | ICD-10-CM | POA: Diagnosis not present

## 2018-09-21 DIAGNOSIS — R293 Abnormal posture: Secondary | ICD-10-CM | POA: Diagnosis not present

## 2018-09-24 ENCOUNTER — Ambulatory Visit
Admission: RE | Admit: 2018-09-24 | Discharge: 2018-09-24 | Disposition: A | Discharge status: Home / Self Care | Payer: PPO | Source: Ambulatory Visit | Attending: Neurology | Admitting: Neurology

## 2018-09-24 ENCOUNTER — Telehealth: Payer: Self-pay | Admitting: Neurology

## 2018-09-24 DIAGNOSIS — R51 Headache: Secondary | ICD-10-CM | POA: Diagnosis not present

## 2018-09-24 DIAGNOSIS — G8929 Other chronic pain: Secondary | ICD-10-CM

## 2018-09-24 DIAGNOSIS — G4489 Other headache syndrome: Secondary | ICD-10-CM

## 2018-09-24 DIAGNOSIS — M48061 Spinal stenosis, lumbar region without neurogenic claudication: Secondary | ICD-10-CM | POA: Diagnosis not present

## 2018-09-24 DIAGNOSIS — M545 Low back pain: Principal | ICD-10-CM

## 2018-09-24 MED ORDER — IOPAMIDOL (ISOVUE-300) INJECTION 61%: 100.0000 mL | Freq: Once | INTRAVENOUS | Status: DC | PRN

## 2018-09-24 NOTE — Telephone Encounter (Signed)
I called the patient.  The x-ray of the low back shows multilevel degenerative changes, severe disc collapse at multiple levels.  CT of the brain has not been formally read, I have reviewed the study and it appears to be unremarkable, no evidence of subdural or other etiology for headache following the fall.  The patient has some transient confusion when first getting on Comtan, the son will call me if this continues, we may have to cut back on the number of tablets that he is getting.   XR lumbar 09/24/18:  IMPRESSION: No acute bony pathology.  Chronic changes are noted.

## 2018-09-25 ENCOUNTER — Other Ambulatory Visit: Payer: Self-pay | Admitting: Neurology

## 2018-10-14 ENCOUNTER — Other Ambulatory Visit: Payer: Self-pay | Admitting: Neurology

## 2018-10-20 DIAGNOSIS — M6281 Muscle weakness (generalized): Secondary | ICD-10-CM | POA: Diagnosis not present

## 2018-10-20 DIAGNOSIS — R531 Weakness: Secondary | ICD-10-CM | POA: Diagnosis not present

## 2018-10-20 DIAGNOSIS — G20 Parkinson's disease: Secondary | ICD-10-CM | POA: Diagnosis not present

## 2018-10-20 DIAGNOSIS — R2681 Unsteadiness on feet: Secondary | ICD-10-CM | POA: Diagnosis not present

## 2018-10-20 DIAGNOSIS — R293 Abnormal posture: Secondary | ICD-10-CM | POA: Diagnosis not present

## 2018-11-13 ENCOUNTER — Other Ambulatory Visit: Payer: Self-pay | Admitting: Neurology

## 2018-11-16 ENCOUNTER — Telehealth: Payer: Self-pay

## 2018-11-16 NOTE — Telephone Encounter (Signed)
I recall signing orders for speech therapy.  We were not sure where orders came from the back brace.  We definitely need to try to keep him out of office.  Doubt he would have capability for webex follow up.  We can sign back brace order if they can send over.

## 2018-11-16 NOTE — Telephone Encounter (Signed)
[employer] and she stated that they did receive the speech therapy forms and the will send over again the [facility] back brace request due to patient having parkinsons and declining in posture and quality that needs a brace to help him.

## 2018-11-16 NOTE — Telephone Encounter (Signed)
Paperwork for back brace has been placed in red folder. Please return when complete.

## 2018-11-16 NOTE — Telephone Encounter (Signed)
Do you remember orders for speech therapy?  Do I need to set up a visit for the reason for a back brace?  Please advise.  Copied from [place] [phone]. Topic: General - Inquiry >> Nov 13, 2018  3:23 PM Berneta Levins wrote: Reason for CRM:   Marcelino Duster from [facility] called and left message on [place] box.   States that she is looking for returned orders on speech therapy and a back brace for pt. Francesca Jewett states she can be reached at [phone]

## 2018-11-17 NOTE — Telephone Encounter (Signed)
done

## 2018-11-18 DIAGNOSIS — G20 Parkinson's disease: Secondary | ICD-10-CM | POA: Diagnosis not present

## 2018-11-18 DIAGNOSIS — M6281 Muscle weakness (generalized): Secondary | ICD-10-CM | POA: Diagnosis not present

## 2018-11-18 DIAGNOSIS — R2681 Unsteadiness on feet: Secondary | ICD-10-CM | POA: Diagnosis not present

## 2018-11-18 DIAGNOSIS — R293 Abnormal posture: Secondary | ICD-10-CM | POA: Diagnosis not present

## 2018-11-18 DIAGNOSIS — R531 Weakness: Secondary | ICD-10-CM | POA: Diagnosis not present

## 2018-12-07 ENCOUNTER — Encounter: Payer: Self-pay | Admitting: Family Medicine

## 2018-12-14 ENCOUNTER — Telehealth: Payer: Self-pay | Admitting: *Deleted

## 2018-12-14 ENCOUNTER — Other Ambulatory Visit: Payer: Self-pay

## 2018-12-14 MED ORDER — FAMOTIDINE 20 MG PO TABS: 20.0000 mg | ORAL_TABLET | Freq: Two times a day (BID) | ORAL | 3 refills | Status: DC

## 2018-12-14 NOTE — Telephone Encounter (Signed)
Called [facility] and spoke to [facility] and let her know that I have sent a new prescription for Pepcid 20 mg through escribe. Misty verbalized an understanding.

## 2018-12-14 NOTE — Telephone Encounter (Signed)
Copied from [place] [phone]. Topic: General - Other >> Dec 14, 2018  1:00 PM Yvette Rack wrote: Reason for CRM: Anderson Malta with [facility] stated the ranitidine (ZANTAC) 150 MG tablet has been discontinued and taken off the market. Anderson Malta asked if there is another medication that Dr. Elease Hashimoto would like to prescribe in the place of it. Cb# [phone]

## 2018-12-21 ENCOUNTER — Telehealth: Payer: Self-pay | Admitting: Neurology

## 2018-12-21 NOTE — Telephone Encounter (Signed)
Noted will wait for nursing home to call and schedule for [name].

## 2018-12-21 NOTE — Telephone Encounter (Signed)
Pts son [name] called stating the pt has worsened since last seen- stating the pt has starting seeing children very often and "feels stuck". [name] states the nursing staff will call to schedule a vv for the pt and would like him seen sooner if at all possible FYI

## 2018-12-28 ENCOUNTER — Telehealth: Payer: Self-pay | Admitting: Neurology

## 2018-12-28 NOTE — Telephone Encounter (Signed)
[facility] called to confirm pts appt. They have consented to a Virtual Visit. They would like the link to be sent to [email or web address].

## 2018-12-28 NOTE — Telephone Encounter (Signed)
Pt has been scheduled for 01/07/19. Link has been provided. If son returns call about a earlier appt please direct him to [facility] to discuss moving it up.

## 2018-12-30 ENCOUNTER — Telehealth: Payer: Self-pay | Admitting: Cardiology

## 2018-12-30 NOTE — Telephone Encounter (Signed)
LVM for pt to call and schedule followup with [facility].

## 2018-12-31 ENCOUNTER — Encounter: Payer: Self-pay | Admitting: Family Medicine

## 2018-12-31 DIAGNOSIS — N390 Urinary tract infection, site not specified: Secondary | ICD-10-CM | POA: Diagnosis not present

## 2019-01-01 ENCOUNTER — Other Ambulatory Visit: Payer: Self-pay

## 2019-01-14 ENCOUNTER — Ambulatory Visit (INDEPENDENT_AMBULATORY_CARE_PROVIDER_SITE_OTHER): Payer: PPO | Admitting: Neurology

## 2019-01-14 ENCOUNTER — Encounter: Payer: Self-pay | Admitting: Neurology

## 2019-01-14 ENCOUNTER — Other Ambulatory Visit: Payer: Self-pay

## 2019-01-14 DIAGNOSIS — R443 Hallucinations, unspecified: Secondary | ICD-10-CM

## 2019-01-14 DIAGNOSIS — R269 Unspecified abnormalities of gait and mobility: Secondary | ICD-10-CM

## 2019-01-14 DIAGNOSIS — F22 Delusional disorders: Secondary | ICD-10-CM | POA: Diagnosis not present

## 2019-01-14 DIAGNOSIS — R413 Other amnesia: Secondary | ICD-10-CM | POA: Diagnosis not present

## 2019-01-14 DIAGNOSIS — G20 Parkinson's disease: Secondary | ICD-10-CM | POA: Diagnosis not present

## 2019-01-14 MED ORDER — PIMAVANSERIN TARTRATE 34 MG PO CAPS: 34.0000 mg | ORAL_CAPSULE | Freq: Every day | ORAL | 3 refills | Status: DC

## 2019-01-14 NOTE — Telephone Encounter (Signed)
I called the son.  Patient has been having some issues with both hallucinations and delusional thinking.  This seems to result in some anxiety on the part of the patient.  We will discussed this issue on the visit today.  I have given the son a link to the meeting through his email, he may be able to join the meeting.

## 2019-01-14 NOTE — Telephone Encounter (Addendum)
[name] ( pt's son, ok per dpr) called regarding pt's f/u scheduled for 01/14/19 at 4 pm. I was able to confirm the pt has been scheduled and this will be a virtual video visit. [name] was under the impression this would be an in office visit. I expressed apologies for him not being notified.    [name] is asking if Dr. Jannifer Franklin would reach out to him at [phone] to discuss recent changes since he will not be present for the visit.  He states over the past 6-8 weeks pt has been experiencing more agitation and delusions. Son reports this is getting progressively worse and is questioning if this could be related to Lewy Body Dementia?  I advised I would fwd message to MD and ask him to review.

## 2019-01-14 NOTE — Progress Notes (Signed)
     Virtual Visit via Video Note  I connected with [name]. on 01/14/19 at  4:00 PM EDT by a video enabled telemedicine application and verified that I am speaking with the correct person using two identifiers.  Location: Patient: The patient is at [facility] Provider: Physician in office.   I discussed the limitations of evaluation and management by telemedicine and the availability of in person appointments. The patient expressed understanding and agreed to proceed.  History of Present Illness: [name] is a 73 year old right-handed white male with a history of Parkinson's disease associated with dementia.  The dementia issue has worsened gradually over time, the patient is on Namenda currently.  The patient has had some worsening of his cognitive issues over the last 2 months, he has had increased problems with delusional thinking and hallucinations that are resulting in some underlying anxiety.  The patient is having difficulty sleeping at night due to urinary incontinence, he has seen a urologist and has been on several medications without benefit, they have tried posterior tibial nerve stimulation without benefit.  The patient uses an adult diaper but will wake up drenched at night.  The patient is having increasing problems with word finding and short-term memory.  He is having troubles with hypophonia but no troubles with swallowing or choking.  He walks with a walker, he has not had any recent falls.  He currently is not in a regular exercise program.  He remains on Sinemet and Comtan.   Observations/Objective: Video evaluation reveals that the patient is alert, he is cooperative.  He will suddenly drift off talking about his job and having problems keeping up with issues at work.  He has to be reminded that he is retired.  The patient has hypophonia at times, speech otherwise is well enunciated.  The patient has full extraocular movements.  He has good facial symmetry, but  otherwise has masking of the face.  He is able to protrude the tongue in the midline with good lateral movement the tongue.  He has good finger-nose-finger and heel shin bilaterally.  He is able to arise from a seated position with some assistance.  He is able to walk with a walker with good stride and good turns.  Assessment and Plan: 1.  Parkinson's disease  2.  Dementia  3.  Delusional thinking, hallucinations  4.  Gait disorder  5.  Urinary incontinence  The patient will be placed on Nuplazid for his delusional thinking and hallucinations.  He will continue on his current dose of Sinemet and Comtan.  He may potentially benefit from a condom catheter at night for his incontinence.  He will follow-up here in about 3 months.  Follow Up Instructions: 19-month follow-up with me.   I discussed the assessment and treatment plan with the patient. The patient was provided an opportunity to ask questions and all were answered. The patient agreed with the plan and demonstrated an understanding of the instructions.   The patient was advised to call back or seek an in-person evaluation if the symptoms worsen or if the condition fails to improve as anticipated.  I provided 30 minutes of non-face-to-face time during this encounter.   Kathrynn Ducking, MD

## 2019-01-19 ENCOUNTER — Telehealth: Payer: Self-pay

## 2019-01-19 NOTE — Telephone Encounter (Signed)
PA for Nuplazid 34 mg has been approved through [employer].  PA approval dated 01/19/19-01/19/20.

## 2019-01-19 NOTE — Telephone Encounter (Signed)
PA for nuplazid has been completed through cover my meds. (Key: [id number])  [facility] has received your information, and the request will be reviewed. You may close this dialog, return to your dashboard, and perform other tasks.  You will receive an electronic determination in CoverMyMeds. You can see the latest determination by locating this request on your dashboard or by reopening this request. You will also receive a faxed copy of the determination. If you have any questions please contact [facility] at [phone].  If you need assistance, please chat with CoverMyMeds or call us at [phone].

## 2019-01-20 ENCOUNTER — Telehealth: Payer: Self-pay | Admitting: Neurology

## 2019-01-20 NOTE — Telephone Encounter (Signed)
Called patient and LVM to schedule a 3 month follow-up with Dr. Jannifer Franklin.

## 2019-01-26 MED ORDER — QUETIAPINE FUMARATE 25 MG PO TABS: 25.0000 mg | ORAL_TABLET | Freq: Every day | ORAL | 2 refills | Status: DC

## 2019-01-26 NOTE — Telephone Encounter (Signed)
I called the patient.  The new placid is cost prohibitive, out-of-pocket would be over thousand dollars for the patient.  We will discontinue the medication, try low-dose Seroquel, the patient will let me know if this is not tolerated or is not helpful.

## 2019-01-26 NOTE — Telephone Encounter (Addendum)
Pharmacist from [facility] called in regards to nuplazid. PA has been approved but pt is currently in the coverage gap and is out of pocket expense would be over 1000 $. Will fwd to MD to review.

## 2019-01-26 NOTE — Addendum Note (Signed)
Addended by: Kathrynn Ducking on: 01/26/2019 04:04 PM   Modules accepted: Orders

## 2019-02-23 ENCOUNTER — Other Ambulatory Visit: Payer: Self-pay | Admitting: Neurology

## 2019-04-05 ENCOUNTER — Ambulatory Visit: Payer: PPO

## 2019-04-30 ENCOUNTER — Telehealth: Payer: Self-pay | Admitting: Family Medicine

## 2019-04-30 NOTE — Telephone Encounter (Signed)
RoseMarie would like a call from PCP nurse. She can go ahead with verbal orders but would like a copy of sign papers. Call back [phone]

## 2019-04-30 NOTE — Telephone Encounter (Signed)
Home Health Verbal Orders - Caller/Agency: Rosemarie/ [facility]  Callback Number: [phone] vm can be left Requesting OT and PT Frequency: eval and treat Family is aware and agrees  Orders have been faxed to the office with no response   Pt has an overall decline in all areas

## 2019-04-30 NOTE — Telephone Encounter (Signed)
Verbal orders given to RoseMarie.  She states she has faxed another copy to be signed.

## 2019-04-30 NOTE — Telephone Encounter (Signed)
I did sign some recent orders for him- so not why they have not gotten back.   Go ahead with verbal OK for services as requested.

## 2019-05-03 ENCOUNTER — Telehealth: Payer: Self-pay

## 2019-05-03 NOTE — Telephone Encounter (Signed)
Left message for Ms. Hall to call back. Voice mail was not verified.  CRM created.

## 2019-05-03 NOTE — Telephone Encounter (Signed)
Ok for orders? 

## 2019-05-03 NOTE — Telephone Encounter (Signed)
Copied from [place] [id number]. Topic: General - Other >> May 03, 2019 11:09 AM Jodie Echevaria wrote: Reason for CRM: Bearl Mulberry called to have orders faxed over for PT and OT to fax# [phone]. Asking for this to be sent today please can be reached at Ph# [phone]

## 2019-05-03 NOTE — Telephone Encounter (Signed)
Fax was received and placed in Dr. Erick Blinks red folder.

## 2019-05-03 NOTE — Telephone Encounter (Signed)
Ms. [name] called back and stated that she has faxed over order forms for [place] to fax back.

## 2019-05-03 NOTE — Telephone Encounter (Signed)
OK 

## 2019-05-04 NOTE — Telephone Encounter (Signed)
done

## 2019-05-05 NOTE — Telephone Encounter (Signed)
This paperwork was faxed today.  Called and left a detailed voice message on Rosemary's voice message to let her know that I have faxed his paperwork today and I did receive a confirmation.

## 2019-05-13 DIAGNOSIS — M6281 Muscle weakness (generalized): Secondary | ICD-10-CM | POA: Diagnosis not present

## 2019-05-13 DIAGNOSIS — R2681 Unsteadiness on feet: Secondary | ICD-10-CM | POA: Diagnosis not present

## 2019-05-13 DIAGNOSIS — R2689 Other abnormalities of gait and mobility: Secondary | ICD-10-CM | POA: Diagnosis not present

## 2019-05-17 DIAGNOSIS — M6281 Muscle weakness (generalized): Secondary | ICD-10-CM | POA: Diagnosis not present

## 2019-05-17 DIAGNOSIS — R2681 Unsteadiness on feet: Secondary | ICD-10-CM | POA: Diagnosis not present

## 2019-05-17 DIAGNOSIS — R2689 Other abnormalities of gait and mobility: Secondary | ICD-10-CM | POA: Diagnosis not present

## 2019-05-18 DIAGNOSIS — M6281 Muscle weakness (generalized): Secondary | ICD-10-CM | POA: Diagnosis not present

## 2019-05-18 DIAGNOSIS — R2689 Other abnormalities of gait and mobility: Secondary | ICD-10-CM | POA: Diagnosis not present

## 2019-05-19 DIAGNOSIS — R2681 Unsteadiness on feet: Secondary | ICD-10-CM | POA: Diagnosis not present

## 2019-05-19 DIAGNOSIS — M6281 Muscle weakness (generalized): Secondary | ICD-10-CM | POA: Diagnosis not present

## 2019-05-20 DIAGNOSIS — R2689 Other abnormalities of gait and mobility: Secondary | ICD-10-CM | POA: Diagnosis not present

## 2019-05-20 DIAGNOSIS — M6281 Muscle weakness (generalized): Secondary | ICD-10-CM | POA: Diagnosis not present

## 2019-05-21 DIAGNOSIS — M6281 Muscle weakness (generalized): Secondary | ICD-10-CM | POA: Diagnosis not present

## 2019-05-21 DIAGNOSIS — R2681 Unsteadiness on feet: Secondary | ICD-10-CM | POA: Diagnosis not present

## 2019-05-24 DIAGNOSIS — M6281 Muscle weakness (generalized): Secondary | ICD-10-CM | POA: Diagnosis not present

## 2019-05-24 DIAGNOSIS — R2681 Unsteadiness on feet: Secondary | ICD-10-CM | POA: Diagnosis not present

## 2019-05-25 DIAGNOSIS — M6281 Muscle weakness (generalized): Secondary | ICD-10-CM | POA: Diagnosis not present

## 2019-05-25 DIAGNOSIS — R2681 Unsteadiness on feet: Secondary | ICD-10-CM | POA: Diagnosis not present

## 2019-05-26 DIAGNOSIS — R2689 Other abnormalities of gait and mobility: Secondary | ICD-10-CM | POA: Diagnosis not present

## 2019-05-26 DIAGNOSIS — M6281 Muscle weakness (generalized): Secondary | ICD-10-CM | POA: Diagnosis not present

## 2019-05-27 DIAGNOSIS — R2689 Other abnormalities of gait and mobility: Secondary | ICD-10-CM | POA: Diagnosis not present

## 2019-05-27 DIAGNOSIS — R2681 Unsteadiness on feet: Secondary | ICD-10-CM | POA: Diagnosis not present

## 2019-05-27 DIAGNOSIS — M6281 Muscle weakness (generalized): Secondary | ICD-10-CM | POA: Diagnosis not present

## 2019-05-28 DIAGNOSIS — M6281 Muscle weakness (generalized): Secondary | ICD-10-CM | POA: Diagnosis not present

## 2019-05-28 DIAGNOSIS — R2689 Other abnormalities of gait and mobility: Secondary | ICD-10-CM | POA: Diagnosis not present

## 2019-05-31 DIAGNOSIS — M6281 Muscle weakness (generalized): Secondary | ICD-10-CM | POA: Diagnosis not present

## 2019-05-31 DIAGNOSIS — R2689 Other abnormalities of gait and mobility: Secondary | ICD-10-CM | POA: Diagnosis not present

## 2019-06-01 DIAGNOSIS — R2681 Unsteadiness on feet: Secondary | ICD-10-CM | POA: Diagnosis not present

## 2019-06-01 DIAGNOSIS — M6281 Muscle weakness (generalized): Secondary | ICD-10-CM | POA: Diagnosis not present

## 2019-06-02 DIAGNOSIS — M6281 Muscle weakness (generalized): Secondary | ICD-10-CM | POA: Diagnosis not present

## 2019-06-02 DIAGNOSIS — R2681 Unsteadiness on feet: Secondary | ICD-10-CM | POA: Diagnosis not present

## 2019-06-03 DIAGNOSIS — R2689 Other abnormalities of gait and mobility: Secondary | ICD-10-CM | POA: Diagnosis not present

## 2019-06-03 DIAGNOSIS — M6281 Muscle weakness (generalized): Secondary | ICD-10-CM | POA: Diagnosis not present

## 2019-06-04 DIAGNOSIS — R2689 Other abnormalities of gait and mobility: Secondary | ICD-10-CM | POA: Diagnosis not present

## 2019-06-04 DIAGNOSIS — R2681 Unsteadiness on feet: Secondary | ICD-10-CM | POA: Diagnosis not present

## 2019-06-04 DIAGNOSIS — M6281 Muscle weakness (generalized): Secondary | ICD-10-CM | POA: Diagnosis not present

## 2019-06-07 DIAGNOSIS — R2681 Unsteadiness on feet: Secondary | ICD-10-CM | POA: Diagnosis not present

## 2019-06-07 DIAGNOSIS — R2689 Other abnormalities of gait and mobility: Secondary | ICD-10-CM | POA: Diagnosis not present

## 2019-06-07 DIAGNOSIS — M6281 Muscle weakness (generalized): Secondary | ICD-10-CM | POA: Diagnosis not present

## 2019-06-08 DIAGNOSIS — R2681 Unsteadiness on feet: Secondary | ICD-10-CM | POA: Diagnosis not present

## 2019-06-08 DIAGNOSIS — M6281 Muscle weakness (generalized): Secondary | ICD-10-CM | POA: Diagnosis not present

## 2019-06-09 ENCOUNTER — Telehealth: Payer: Self-pay | Admitting: *Deleted

## 2019-06-09 NOTE — Telephone Encounter (Signed)
A message was left, re: his follow up visit. 

## 2019-06-10 ENCOUNTER — Telehealth: Payer: Self-pay

## 2019-06-10 DIAGNOSIS — R2689 Other abnormalities of gait and mobility: Secondary | ICD-10-CM | POA: Diagnosis not present

## 2019-06-10 DIAGNOSIS — M6281 Muscle weakness (generalized): Secondary | ICD-10-CM | POA: Diagnosis not present

## 2019-06-10 DIAGNOSIS — R2681 Unsteadiness on feet: Secondary | ICD-10-CM | POA: Diagnosis not present

## 2019-06-10 NOTE — Telephone Encounter (Signed)
Copied from [place] [id number]. Topic: General - Other >> Jun 10, 2019 10:44 AM Yvette Rack wrote: Reason for CRM: Bari Mantis with [facility] stated they have not received the 2 documents for OT eval and re-cert and if it is not received today they will be out of compliance. Cb# [phone]

## 2019-06-11 DIAGNOSIS — R2681 Unsteadiness on feet: Secondary | ICD-10-CM | POA: Diagnosis not present

## 2019-06-11 DIAGNOSIS — M6281 Muscle weakness (generalized): Secondary | ICD-10-CM | POA: Diagnosis not present

## 2019-06-11 NOTE — Telephone Encounter (Signed)
Spoke with Rose and informed her that the only thing we received so far was for OT and this was faxed on 05/31/19 with a successful confirmation. She states there are two additional forms that she has recently faxed.    Dr. Elease Hashimoto, please be on the lookout in your folder for these form

## 2019-06-11 NOTE — Telephone Encounter (Signed)
Done

## 2019-06-11 NOTE — Telephone Encounter (Signed)
Faxed everything that we received today

## 2019-06-14 NOTE — Telephone Encounter (Signed)
Bari Mantis with [facility] is calling back to let [place] know that she did not receive the 2 document for OT eval and recert. Bari Mantis does not want to be out of compliance. [phone] Cb- [phone]

## 2019-06-15 DIAGNOSIS — M6281 Muscle weakness (generalized): Secondary | ICD-10-CM | POA: Diagnosis not present

## 2019-06-15 DIAGNOSIS — R2689 Other abnormalities of gait and mobility: Secondary | ICD-10-CM | POA: Diagnosis not present

## 2019-06-15 NOTE — Telephone Encounter (Signed)
I have re-faxed again all #34 pages that she has sent. Included are the OT eval and recert. They are amongst the first pages

## 2019-06-16 DIAGNOSIS — M6281 Muscle weakness (generalized): Secondary | ICD-10-CM | POA: Diagnosis not present

## 2019-06-16 DIAGNOSIS — R2681 Unsteadiness on feet: Secondary | ICD-10-CM | POA: Diagnosis not present

## 2019-06-17 DIAGNOSIS — R2681 Unsteadiness on feet: Secondary | ICD-10-CM | POA: Diagnosis not present

## 2019-06-17 DIAGNOSIS — R2689 Other abnormalities of gait and mobility: Secondary | ICD-10-CM | POA: Diagnosis not present

## 2019-06-17 DIAGNOSIS — M6281 Muscle weakness (generalized): Secondary | ICD-10-CM | POA: Diagnosis not present

## 2019-06-17 NOTE — Telephone Encounter (Signed)
Called Rose and verified which fax #. She requested this to be faxed to [phone]. I sent fax twice cause one confirmation stated fax was busy and the other stated no answer. Called Rose and she stated the power was out where she is. She states she will call back on Monday to have forms re-faxed

## 2019-06-17 NOTE — Telephone Encounter (Signed)
[name] calling back about this request. She requests it be sent today to number below.    E-fax: [phone]

## 2019-06-21 DIAGNOSIS — R2681 Unsteadiness on feet: Secondary | ICD-10-CM | POA: Diagnosis not present

## 2019-06-21 DIAGNOSIS — M6281 Muscle weakness (generalized): Secondary | ICD-10-CM | POA: Diagnosis not present

## 2019-06-21 NOTE — Telephone Encounter (Signed)
[name] called back and states that she still hasn't received the paperwork.  Wants to know if she can come by the office and pick these up.

## 2019-06-21 NOTE — Telephone Encounter (Signed)
Called Rose and she states the faxes finally came through today so she doesn't need to come to office to pick. Informed her that if she needed anything else as far as something signed that Dr. Elease Hashimoto is on vacation. She stated she did not need anything further at this time.

## 2019-06-22 DIAGNOSIS — M6281 Muscle weakness (generalized): Secondary | ICD-10-CM | POA: Diagnosis not present

## 2019-06-22 DIAGNOSIS — R2689 Other abnormalities of gait and mobility: Secondary | ICD-10-CM | POA: Diagnosis not present

## 2019-06-23 DIAGNOSIS — R2681 Unsteadiness on feet: Secondary | ICD-10-CM | POA: Diagnosis not present

## 2019-06-23 DIAGNOSIS — M6281 Muscle weakness (generalized): Secondary | ICD-10-CM | POA: Diagnosis not present

## 2019-06-25 DIAGNOSIS — R2681 Unsteadiness on feet: Secondary | ICD-10-CM | POA: Diagnosis not present

## 2019-06-25 DIAGNOSIS — M6281 Muscle weakness (generalized): Secondary | ICD-10-CM | POA: Diagnosis not present

## 2019-06-29 DIAGNOSIS — R2689 Other abnormalities of gait and mobility: Secondary | ICD-10-CM | POA: Diagnosis not present

## 2019-06-29 DIAGNOSIS — M6281 Muscle weakness (generalized): Secondary | ICD-10-CM | POA: Diagnosis not present

## 2019-06-29 DIAGNOSIS — R2681 Unsteadiness on feet: Secondary | ICD-10-CM | POA: Diagnosis not present

## 2019-06-30 DIAGNOSIS — R2689 Other abnormalities of gait and mobility: Secondary | ICD-10-CM | POA: Diagnosis not present

## 2019-06-30 DIAGNOSIS — M6281 Muscle weakness (generalized): Secondary | ICD-10-CM | POA: Diagnosis not present

## 2019-06-30 DIAGNOSIS — R2681 Unsteadiness on feet: Secondary | ICD-10-CM | POA: Diagnosis not present

## 2019-07-01 DIAGNOSIS — M6281 Muscle weakness (generalized): Secondary | ICD-10-CM | POA: Diagnosis not present

## 2019-07-01 DIAGNOSIS — R2689 Other abnormalities of gait and mobility: Secondary | ICD-10-CM | POA: Diagnosis not present

## 2019-07-02 DIAGNOSIS — R2681 Unsteadiness on feet: Secondary | ICD-10-CM | POA: Diagnosis not present

## 2019-07-02 DIAGNOSIS — M6281 Muscle weakness (generalized): Secondary | ICD-10-CM | POA: Diagnosis not present

## 2019-07-05 DIAGNOSIS — M6281 Muscle weakness (generalized): Secondary | ICD-10-CM | POA: Diagnosis not present

## 2019-07-05 DIAGNOSIS — R2681 Unsteadiness on feet: Secondary | ICD-10-CM | POA: Diagnosis not present

## 2019-07-05 DIAGNOSIS — R2689 Other abnormalities of gait and mobility: Secondary | ICD-10-CM | POA: Diagnosis not present

## 2019-07-06 ENCOUNTER — Telehealth: Payer: Self-pay

## 2019-07-06 DIAGNOSIS — M6281 Muscle weakness (generalized): Secondary | ICD-10-CM | POA: Diagnosis not present

## 2019-07-06 DIAGNOSIS — R2681 Unsteadiness on feet: Secondary | ICD-10-CM | POA: Diagnosis not present

## 2019-07-06 NOTE — Telephone Encounter (Signed)
Called patients son [name] to offer virtual appt for patient on 07/13/2019. He stated he thinks the appt was scheduled incorrectly. He stated he recieved a call about an appt for himself, but he did not see anything on his mychart for his appt. He stated his dad is doing ok and does not need a follow up appt right now. Follow up appt canceled.

## 2019-07-08 DIAGNOSIS — R2689 Other abnormalities of gait and mobility: Secondary | ICD-10-CM | POA: Diagnosis not present

## 2019-07-08 DIAGNOSIS — R2681 Unsteadiness on feet: Secondary | ICD-10-CM | POA: Diagnosis not present

## 2019-07-08 DIAGNOSIS — M6281 Muscle weakness (generalized): Secondary | ICD-10-CM | POA: Diagnosis not present

## 2019-07-12 ENCOUNTER — Ambulatory Visit: Payer: PPO | Admitting: Cardiology

## 2019-07-12 DIAGNOSIS — R2689 Other abnormalities of gait and mobility: Secondary | ICD-10-CM | POA: Diagnosis not present

## 2019-07-12 DIAGNOSIS — M6281 Muscle weakness (generalized): Secondary | ICD-10-CM | POA: Diagnosis not present

## 2019-07-12 DIAGNOSIS — R2681 Unsteadiness on feet: Secondary | ICD-10-CM | POA: Diagnosis not present

## 2019-07-13 DIAGNOSIS — R2681 Unsteadiness on feet: Secondary | ICD-10-CM | POA: Diagnosis not present

## 2019-07-13 DIAGNOSIS — M6281 Muscle weakness (generalized): Secondary | ICD-10-CM | POA: Diagnosis not present

## 2019-07-19 DIAGNOSIS — M6281 Muscle weakness (generalized): Secondary | ICD-10-CM | POA: Diagnosis not present

## 2019-07-19 DIAGNOSIS — R2681 Unsteadiness on feet: Secondary | ICD-10-CM | POA: Diagnosis not present

## 2019-07-19 DIAGNOSIS — R2689 Other abnormalities of gait and mobility: Secondary | ICD-10-CM | POA: Diagnosis not present

## 2019-07-20 DIAGNOSIS — R2681 Unsteadiness on feet: Secondary | ICD-10-CM | POA: Diagnosis not present

## 2019-07-20 DIAGNOSIS — M6281 Muscle weakness (generalized): Secondary | ICD-10-CM | POA: Diagnosis not present

## 2019-07-21 DIAGNOSIS — R2681 Unsteadiness on feet: Secondary | ICD-10-CM | POA: Diagnosis not present

## 2019-07-21 DIAGNOSIS — M6281 Muscle weakness (generalized): Secondary | ICD-10-CM | POA: Diagnosis not present

## 2019-07-21 DIAGNOSIS — R2689 Other abnormalities of gait and mobility: Secondary | ICD-10-CM | POA: Diagnosis not present

## 2019-07-22 DIAGNOSIS — Z20828 Contact with and (suspected) exposure to other viral communicable diseases: Secondary | ICD-10-CM | POA: Diagnosis not present

## 2019-07-22 DIAGNOSIS — R2689 Other abnormalities of gait and mobility: Secondary | ICD-10-CM | POA: Diagnosis not present

## 2019-07-22 DIAGNOSIS — M6281 Muscle weakness (generalized): Secondary | ICD-10-CM | POA: Diagnosis not present

## 2019-07-26 DIAGNOSIS — R2689 Other abnormalities of gait and mobility: Secondary | ICD-10-CM | POA: Diagnosis not present

## 2019-07-26 DIAGNOSIS — M6281 Muscle weakness (generalized): Secondary | ICD-10-CM | POA: Diagnosis not present

## 2019-07-26 DIAGNOSIS — R2681 Unsteadiness on feet: Secondary | ICD-10-CM | POA: Diagnosis not present

## 2019-07-27 DIAGNOSIS — M6281 Muscle weakness (generalized): Secondary | ICD-10-CM | POA: Diagnosis not present

## 2019-07-27 DIAGNOSIS — R2681 Unsteadiness on feet: Secondary | ICD-10-CM | POA: Diagnosis not present

## 2019-07-27 DIAGNOSIS — R2689 Other abnormalities of gait and mobility: Secondary | ICD-10-CM | POA: Diagnosis not present

## 2019-07-27 DIAGNOSIS — Z20828 Contact with and (suspected) exposure to other viral communicable diseases: Secondary | ICD-10-CM | POA: Diagnosis not present

## 2019-07-28 DIAGNOSIS — M6281 Muscle weakness (generalized): Secondary | ICD-10-CM | POA: Diagnosis not present

## 2019-07-28 DIAGNOSIS — R2681 Unsteadiness on feet: Secondary | ICD-10-CM | POA: Diagnosis not present

## 2019-07-29 DIAGNOSIS — M6281 Muscle weakness (generalized): Secondary | ICD-10-CM | POA: Diagnosis not present

## 2019-07-29 DIAGNOSIS — R2681 Unsteadiness on feet: Secondary | ICD-10-CM | POA: Diagnosis not present

## 2019-07-29 DIAGNOSIS — R2689 Other abnormalities of gait and mobility: Secondary | ICD-10-CM | POA: Diagnosis not present

## 2019-07-30 DIAGNOSIS — R2689 Other abnormalities of gait and mobility: Secondary | ICD-10-CM | POA: Diagnosis not present

## 2019-07-30 DIAGNOSIS — M6281 Muscle weakness (generalized): Secondary | ICD-10-CM | POA: Diagnosis not present

## 2019-07-30 DIAGNOSIS — R2681 Unsteadiness on feet: Secondary | ICD-10-CM | POA: Diagnosis not present

## 2019-08-02 ENCOUNTER — Encounter: Payer: Self-pay | Admitting: Family Medicine

## 2019-08-02 DIAGNOSIS — N390 Urinary tract infection, site not specified: Secondary | ICD-10-CM | POA: Diagnosis not present

## 2019-08-03 DIAGNOSIS — R2681 Unsteadiness on feet: Secondary | ICD-10-CM | POA: Diagnosis not present

## 2019-08-03 DIAGNOSIS — Z20828 Contact with and (suspected) exposure to other viral communicable diseases: Secondary | ICD-10-CM | POA: Diagnosis not present

## 2019-08-03 DIAGNOSIS — R2689 Other abnormalities of gait and mobility: Secondary | ICD-10-CM | POA: Diagnosis not present

## 2019-08-03 DIAGNOSIS — M6281 Muscle weakness (generalized): Secondary | ICD-10-CM | POA: Diagnosis not present

## 2019-08-05 DIAGNOSIS — M6281 Muscle weakness (generalized): Secondary | ICD-10-CM | POA: Diagnosis not present

## 2019-08-05 DIAGNOSIS — R2689 Other abnormalities of gait and mobility: Secondary | ICD-10-CM | POA: Diagnosis not present

## 2019-08-06 DIAGNOSIS — M6281 Muscle weakness (generalized): Secondary | ICD-10-CM | POA: Diagnosis not present

## 2019-08-06 DIAGNOSIS — R2681 Unsteadiness on feet: Secondary | ICD-10-CM | POA: Diagnosis not present

## 2019-08-09 DIAGNOSIS — M6281 Muscle weakness (generalized): Secondary | ICD-10-CM | POA: Diagnosis not present

## 2019-08-09 DIAGNOSIS — R2681 Unsteadiness on feet: Secondary | ICD-10-CM | POA: Diagnosis not present

## 2019-08-09 DIAGNOSIS — Z20828 Contact with and (suspected) exposure to other viral communicable diseases: Secondary | ICD-10-CM | POA: Diagnosis not present

## 2019-08-10 ENCOUNTER — Other Ambulatory Visit: Payer: Self-pay

## 2019-08-10 ENCOUNTER — Ambulatory Visit (INDEPENDENT_AMBULATORY_CARE_PROVIDER_SITE_OTHER): Payer: PPO | Admitting: Family Medicine

## 2019-08-10 ENCOUNTER — Encounter: Payer: Self-pay | Admitting: Family Medicine

## 2019-08-10 VITALS — BP 182/86 | HR 48 | Temp 97.7°F | Wt 148.4 lb

## 2019-08-10 DIAGNOSIS — G20 Parkinson's disease: Secondary | ICD-10-CM | POA: Diagnosis not present

## 2019-08-10 DIAGNOSIS — I10 Essential (primary) hypertension: Secondary | ICD-10-CM | POA: Diagnosis not present

## 2019-08-10 DIAGNOSIS — M6281 Muscle weakness (generalized): Secondary | ICD-10-CM | POA: Diagnosis not present

## 2019-08-10 DIAGNOSIS — R2689 Other abnormalities of gait and mobility: Secondary | ICD-10-CM | POA: Diagnosis not present

## 2019-08-10 LAB — COMPREHENSIVE METABOLIC PANEL
ALT: 4 U/L (ref 0–53)
AST: 10 U/L (ref 0–37)
Albumin: 4.1 g/dL (ref 3.5–5.2)
Alkaline Phosphatase: 104 U/L (ref 39–117)
BUN: 18 mg/dL (ref 6–23)
CO2: 33 mEq/L — ABNORMAL HIGH (ref 19–32)
Calcium: 9.6 mg/dL (ref 8.4–10.5)
Chloride: 103 mEq/L (ref 96–112)
Creatinine, Ser: 1.04 mg/dL (ref 0.40–1.50)
GFR: 69.71 mL/min (ref >60.00)
Glucose, Bld: 90 mg/dL (ref 70–99)
Potassium: 4.1 mEq/L (ref 3.5–5.1)
Sodium: 142 mEq/L (ref 135–145)
Total Bilirubin: 0.8 mg/dL (ref 0.2–1.2)
Total Protein: 6.7 g/dL (ref 6.0–8.3)

## 2019-08-10 MED ORDER — AMLODIPINE BESYLATE 10 MG PO TABS: 10.0000 mg | ORAL_TABLET | Freq: Every day | ORAL | 3 refills | Status: DC

## 2019-08-10 NOTE — Progress Notes (Signed)
Subjective:     Patient ID: [name] Brooking., male   DOB: Jul 21, 1946, 73 y.o.   MRN: [id number]  HPI [name] is seen accompanied by his son.  This is the first time they have seen each other in person since March.  [name] has chronic problems including hypertension, Parkinson's disease, GERD, osteoporosis, spinal stenosis He resides at an assisted living complex.  We had gotten some recent call about elevated blood pressure.  He apparently had 1 reading over [id number] systolic.  He is currently on amlodipine 5 mg daily, losartan 100 mg daily, and Lopressor 25 mg daily.  Several years ago he had problems with orthostatic hypotension.  Denies any recent headaches.  No chest pains.  Frequent falls.  Ambulates with a walker.  Has had fairly extensive physical therapy in the past.  He states his appetite is not very good.  He has had progressive disability from his Parkinson's over the past several years.  Followed by neurology.  His biggest complaint is urine incontinence and has seen urologist but they have had very little to offer.  Past Medical History:  Diagnosis Date  . Abdominal pain, right lower quadrant   . Abnormality of gait 05/16/2016  . Acute prostatitis   . Anxiety   . Constipation   . Degenerative disc disease   . Depression   . Diverticulosis of colon (without mention of hemorrhage)   . Essential and other specified forms of tremor   . Family history of malignant neoplasm of gastrointestinal tract   . Fibromyalgia   . Ganglion and cyst of synovium, tendon, and bursa   . GERD (gastroesophageal reflux disease)   . Hemarthrosis, upper arm   . Hyperlipidemia   . Hypersomnia with sleep apnea, unspecified   . Hypertension   . Hypopotassemia   . Localized osteoarthrosis not specified whether primary or secondary, lower leg   . Lumbago   . Memory difficulties 04/22/2013  . Nocturia   . Osteoporosis, unspecified   . Other testicular hypofunction   . Parkinson disease ([place]) 05/16/2016   . Right inguinal hernia   . Sleep apnea    last sleep study 11/11 on chart- Bipap with settings of 4 per  patient  . Spinal stenosis, lumbar region, without neurogenic claudication   . Syncope and collapse   . Unspecified adverse effect of unspecified drug, medicinal and biological substance   . Unspecified gastritis and gastroduodenitis without mention of hemorrhage   . Urinary frequency    Past Surgical History:  Procedure Laterality Date  . BACK SURGERY     x 3  . [place]  . INGUINAL HERNIA REPAIR  07/19/2011   Procedure: LAPAROSCOPIC INGUINAL HERNIA;  Surgeon: Odis Hollingshead, MD;  Location: [facility];  Service: General;  Laterality: Right;  Laparoscopic Repair of Recurrent Right  Ingunial Hernia with Mesh  . microdisectomy  03/02/2003, 06/29/2003, 12/24/2005  . TONSILLECTOMY      reports that he has never smoked. He has never used smokeless tobacco. He reports that he does not drink alcohol or use drugs. family history includes Colon cancer in his mother; Dementia in his mother; Lung cancer in his paternal grandfather; Parkinsonism (age of onset: 31) in his father. Allergies  Allergen Reactions  . Nucynta [Tapentadol] Other (See Comments)    dizziness  . Pregabalin     REACTION: dizziness and mental status changes  . Exelon [Rivastigmine]     abd pain  Review of Systems  Constitutional: Positive for fatigue. Negative for chills, fever and unexpected weight change.  Eyes: Negative for visual disturbance.  Respiratory: Negative for cough, chest tightness and shortness of breath.   Cardiovascular: Negative for chest pain, palpitations and leg swelling.  Neurological: Negative for dizziness, syncope, light-headedness and headaches.       Objective:   Physical Exam Vitals reviewed.  Constitutional:      General: He is not in acute distress. Cardiovascular:     Rate and Rhythm: Regular rhythm.     Comments: Rate in the low 50s but regular Pulmonary:      Effort: Pulmonary effort is normal.     Breath sounds: Normal breath sounds.  Musculoskeletal:     Left lower leg: No edema.  Neurological:     Mental Status: He is alert.        Assessment:     #1 hypertension poorly controlled.  Repeat reading left arm seated 182/86 and obtained very similar reading standing  #2 Parkinson's disease with progressive disability    Plan:     -We discussed options for blood pressure.  We discussed possibility of HCTZ but he already has major urine incontinence issues and has had previous hypokalemia.  We elected to increase his amlodipine to 10 mg daily but very well may not get to goal with this.  -We recommend they monitor his blood pressure daily for the next couple weeks and be in touch if systolic consistently greater than 150.  If so at that point consider low-dose hydrochlorothiazide -  -Check comprehensive metabolic panel  Eulas Post MD [facility]

## 2019-08-10 NOTE — Patient Instructions (Signed)
Increase the Amlodipine to 10 mg po every day  Check blood pressure once daily for 2 weeks and then per current protocol.  Monitor blood pressure and be in touch in 2 weeks if still consistently > 150/90

## 2019-08-16 DIAGNOSIS — R2689 Other abnormalities of gait and mobility: Secondary | ICD-10-CM | POA: Diagnosis not present

## 2019-08-16 DIAGNOSIS — R2681 Unsteadiness on feet: Secondary | ICD-10-CM | POA: Diagnosis not present

## 2019-08-16 DIAGNOSIS — M6281 Muscle weakness (generalized): Secondary | ICD-10-CM | POA: Diagnosis not present

## 2019-08-16 DIAGNOSIS — Z20828 Contact with and (suspected) exposure to other viral communicable diseases: Secondary | ICD-10-CM | POA: Diagnosis not present

## 2019-08-17 DIAGNOSIS — R2681 Unsteadiness on feet: Secondary | ICD-10-CM | POA: Diagnosis not present

## 2019-08-17 DIAGNOSIS — R2689 Other abnormalities of gait and mobility: Secondary | ICD-10-CM | POA: Diagnosis not present

## 2019-08-17 DIAGNOSIS — M6281 Muscle weakness (generalized): Secondary | ICD-10-CM | POA: Diagnosis not present

## 2019-08-18 DIAGNOSIS — M6281 Muscle weakness (generalized): Secondary | ICD-10-CM | POA: Diagnosis not present

## 2019-08-18 DIAGNOSIS — R2681 Unsteadiness on feet: Secondary | ICD-10-CM | POA: Diagnosis not present

## 2019-08-23 DIAGNOSIS — R2681 Unsteadiness on feet: Secondary | ICD-10-CM | POA: Diagnosis not present

## 2019-08-23 DIAGNOSIS — M6281 Muscle weakness (generalized): Secondary | ICD-10-CM | POA: Diagnosis not present

## 2019-08-24 DIAGNOSIS — R2689 Other abnormalities of gait and mobility: Secondary | ICD-10-CM | POA: Diagnosis not present

## 2019-08-24 DIAGNOSIS — R2681 Unsteadiness on feet: Secondary | ICD-10-CM | POA: Diagnosis not present

## 2019-08-24 DIAGNOSIS — Z20828 Contact with and (suspected) exposure to other viral communicable diseases: Secondary | ICD-10-CM | POA: Diagnosis not present

## 2019-08-24 DIAGNOSIS — M6281 Muscle weakness (generalized): Secondary | ICD-10-CM | POA: Diagnosis not present

## 2019-08-25 DIAGNOSIS — M6281 Muscle weakness (generalized): Secondary | ICD-10-CM | POA: Diagnosis not present

## 2019-08-25 DIAGNOSIS — R2681 Unsteadiness on feet: Secondary | ICD-10-CM | POA: Diagnosis not present

## 2019-08-25 DIAGNOSIS — R2689 Other abnormalities of gait and mobility: Secondary | ICD-10-CM | POA: Diagnosis not present

## 2019-08-26 DIAGNOSIS — M6281 Muscle weakness (generalized): Secondary | ICD-10-CM | POA: Diagnosis not present

## 2019-08-26 DIAGNOSIS — R2689 Other abnormalities of gait and mobility: Secondary | ICD-10-CM | POA: Diagnosis not present

## 2019-08-27 DIAGNOSIS — R2681 Unsteadiness on feet: Secondary | ICD-10-CM | POA: Diagnosis not present

## 2019-08-27 DIAGNOSIS — M6281 Muscle weakness (generalized): Secondary | ICD-10-CM | POA: Diagnosis not present

## 2019-08-30 DIAGNOSIS — R2689 Other abnormalities of gait and mobility: Secondary | ICD-10-CM | POA: Diagnosis not present

## 2019-08-30 DIAGNOSIS — M6281 Muscle weakness (generalized): Secondary | ICD-10-CM | POA: Diagnosis not present

## 2019-08-31 DIAGNOSIS — M6281 Muscle weakness (generalized): Secondary | ICD-10-CM | POA: Diagnosis not present

## 2019-08-31 DIAGNOSIS — Z20828 Contact with and (suspected) exposure to other viral communicable diseases: Secondary | ICD-10-CM | POA: Diagnosis not present

## 2019-08-31 DIAGNOSIS — R2681 Unsteadiness on feet: Secondary | ICD-10-CM | POA: Diagnosis not present

## 2019-09-01 DIAGNOSIS — R2681 Unsteadiness on feet: Secondary | ICD-10-CM | POA: Diagnosis not present

## 2019-09-01 DIAGNOSIS — M6281 Muscle weakness (generalized): Secondary | ICD-10-CM | POA: Diagnosis not present

## 2019-09-02 DIAGNOSIS — R2689 Other abnormalities of gait and mobility: Secondary | ICD-10-CM | POA: Diagnosis not present

## 2019-09-02 DIAGNOSIS — M6281 Muscle weakness (generalized): Secondary | ICD-10-CM | POA: Diagnosis not present

## 2019-09-03 DIAGNOSIS — R2681 Unsteadiness on feet: Secondary | ICD-10-CM | POA: Diagnosis not present

## 2019-09-03 DIAGNOSIS — M6281 Muscle weakness (generalized): Secondary | ICD-10-CM | POA: Diagnosis not present

## 2019-09-06 DIAGNOSIS — M6281 Muscle weakness (generalized): Secondary | ICD-10-CM | POA: Diagnosis not present

## 2019-09-06 DIAGNOSIS — R2681 Unsteadiness on feet: Secondary | ICD-10-CM | POA: Diagnosis not present

## 2019-09-07 DIAGNOSIS — Z20828 Contact with and (suspected) exposure to other viral communicable diseases: Secondary | ICD-10-CM | POA: Diagnosis not present

## 2019-09-08 DIAGNOSIS — R2681 Unsteadiness on feet: Secondary | ICD-10-CM | POA: Diagnosis not present

## 2019-09-08 DIAGNOSIS — M6281 Muscle weakness (generalized): Secondary | ICD-10-CM | POA: Diagnosis not present

## 2019-09-10 ENCOUNTER — Encounter: Payer: Self-pay | Admitting: Cardiology

## 2019-09-10 ENCOUNTER — Telehealth (INDEPENDENT_AMBULATORY_CARE_PROVIDER_SITE_OTHER): Payer: PPO | Admitting: Cardiology

## 2019-09-10 VITALS — BP 130/69 | HR 53 | Ht 68.0 in | Wt 142.2 lb

## 2019-09-10 DIAGNOSIS — G20 Parkinson's disease: Secondary | ICD-10-CM

## 2019-09-10 DIAGNOSIS — I10 Essential (primary) hypertension: Secondary | ICD-10-CM

## 2019-09-10 DIAGNOSIS — R001 Bradycardia, unspecified: Secondary | ICD-10-CM | POA: Insufficient documentation

## 2019-09-10 DIAGNOSIS — R2681 Unsteadiness on feet: Secondary | ICD-10-CM | POA: Diagnosis not present

## 2019-09-10 DIAGNOSIS — M6281 Muscle weakness (generalized): Secondary | ICD-10-CM | POA: Diagnosis not present

## 2019-09-10 NOTE — Patient Instructions (Signed)
Medication Instructions:   STOP Metoprolol  *If you need a refill on your cardiac medications before your next appointment, please call your pharmacy*   Follow-Up: At [facility], you and your health needs are our priority.  As part of our continuing mission to provide you with exceptional heart care, we have created designated Provider Care Teams.  These Care Teams include your primary Cardiologist (physician) and Advanced Practice Providers (APPs -  Physician Assistants and Nurse Practitioners) who all work together to provide you with the care you need, when you need it.  Your next appointment:   Wednesday 09/22/19 at 10:15 AM  The format for your next appointment:   Virtual Visit   Provider:   Kerin Ransom, [place]

## 2019-09-10 NOTE — Progress Notes (Signed)
Virtual Visit via Telephone Note   This visit type was conducted due to national recommendations for restrictions regarding the COVID-19 Pandemic (e.g. social distancing) in an effort to limit this patient's exposure and mitigate transmission in our community.  Due to his co-morbid illnesses, this patient is at least at moderate risk for complications without adequate follow up.  This format is felt to be most appropriate for this patient at this time.  The patient did not have access to video technology/had technical difficulties with video requiring transitioning to audio format only (telephone).  All issues noted in this document were discussed and addressed.  No physical exam could be performed with this format.  Please refer to the patient's chart for his  consent to telehealth for [facility].   Date:  09/10/2019   ID:  [name] Brooking., DOB 1945-12-17, MRN [id number]  Patient Location: [facility] Provider Location: Home  PCP:  Eulas Post, MD  Cardiologist:  Dr Gwenlyn Found Electrophysiologist:  None   Evaluation Performed:  Follow-Up Visit  Chief Complaint:  weakness  History of Present Illness:    [name] Rappa. is a 74 y.o. male with a history of hypertension and Parkinson's disease.  He was contacted at his nursing facility.  I spoke with [name].  The patient was there on speaker phone as well.  [name] reports the patient's had low heart rates, less than 50, pretty consistently.  She feels like he is becoming increasingly weak.  He is not had syncope.  His heart rate has been in the high 40s and low 50s in the past.  Dr. Elease Hashimoto recently adjusted his amlodipine for hypertension, his blood pressure appeared to be well controlled.  The patient does not have symptoms concerning for COVID-19 infection (fever, chills, cough, or new shortness of breath).    Past Medical History:  Diagnosis Date  . Abdominal pain, right lower quadrant   . Abnormality of gait  05/16/2016  . Acute prostatitis   . Anxiety   . Constipation   . Degenerative disc disease   . Depression   . Diverticulosis of colon (without mention of hemorrhage)   . Essential and other specified forms of tremor   . Family history of malignant neoplasm of gastrointestinal tract   . Fibromyalgia   . Ganglion and cyst of synovium, tendon, and bursa   . GERD (gastroesophageal reflux disease)   . Hemarthrosis, upper arm   . Hyperlipidemia   . Hypersomnia with sleep apnea, unspecified   . Hypertension   . Hypopotassemia   . Localized osteoarthrosis not specified whether primary or secondary, lower leg   . Lumbago   . Memory difficulties 04/22/2013  . Nocturia   . Osteoporosis, unspecified   . Other testicular hypofunction   . Parkinson disease ([place]) 05/16/2016  . Right inguinal hernia   . Sleep apnea    last sleep study 11/11 on chart- Bipap with settings of 4 per  patient  . Spinal stenosis, lumbar region, without neurogenic claudication   . Syncope and collapse   . Unspecified adverse effect of unspecified drug, medicinal and biological substance   . Unspecified gastritis and gastroduodenitis without mention of hemorrhage   . Urinary frequency    Past Surgical History:  Procedure Laterality Date  . BACK SURGERY     x 3  . [place]  . INGUINAL HERNIA REPAIR  07/19/2011   Procedure: LAPAROSCOPIC INGUINAL HERNIA;  Surgeon: Odis Hollingshead,  MD;  Location: [facility];  Service: General;  Laterality: Right;  Laparoscopic Repair of Recurrent Right  Ingunial Hernia with Mesh  . microdisectomy  03/02/2003, 06/29/2003, 12/24/2005  . TONSILLECTOMY       Current Meds  Medication Sig  . amLODipine (NORVASC) 10 MG tablet Take 1 tablet (10 mg total) by mouth daily.  . Calcium Carbonate-Vitamin D (CALCIUM 600+D) 600-400 MG-UNIT tablet Take 1 tablet by mouth daily.  . carbidopa-levodopa (SINEMET IR) 25-100 MG tablet TAKE 1 & 1/2 TABLETS BY MOUTH EACH MORNING. TAKE (1) TABLET  BY MOUTH MIDDAY AND IN THE EVENING.  Marland Kitchen Cholecalciferol (VITAMIN D3) 125 MCG (5000 UT) CAPS Take 1 capsule by mouth daily.  . citalopram (CELEXA) 20 MG tablet Take 1 tablet (20 mg total) by mouth daily.  . entacapone (COMTAN) 200 MG tablet TAKE (1) TABLET BY MOUTH (3) TIMES DAILY.  . famotidine (PEPCID) 20 MG tablet Take 1 tablet (20 mg total) by mouth 2 (two) times daily.  Marland Kitchen KLOR-CON M20 20 MEQ tablet TAKE 3 TABLETS (60 MEQ TOTAL) BY MOUTH DAILY.  Marland Kitchen Krill Oil 1000 MG CAPS Take 1 capsule by mouth 2 (two) times daily.  Marland Kitchen losartan (COZAAR) 100 MG tablet TAKE 1 TABLET (100 MG TOTAL) BY MOUTH DAILY.  Marland Kitchen MAGNESIUM CITRATE PO Take 250 mg by mouth 2 (two) times daily.   . memantine (NAMENDA) 10 MG tablet TAKE 1 TABLET (10 MG TOTAL) BY MOUTH 2 (TWO) TIMES DAILY.  . Probiotic Product (ALIGN) 4 MG CAPS Take by mouth daily.   . QUEtiapine (SEROQUEL) 25 MG tablet Take 1 tablet (25 mg total) by mouth at bedtime.  . vitamin B-12 (CYANOCOBALAMIN) 50 MCG tablet Take 50 mcg by mouth daily.   . [DISCONTINUED] cholecalciferol (VITAMIN D) 1000 UNITS tablet Take 5,000 Units by mouth daily.   . [DISCONTINUED] metoprolol tartrate (LOPRESSOR) 25 MG tablet TAKE ONE TABLET ONCE PER DAY.     Allergies:   Nucynta [tapentadol], Pregabalin, and Exelon [rivastigmine]   Social History   Tobacco Use  . Smoking status: Never Smoker  . Smokeless tobacco: Never Used  Substance Use Topics  . Alcohol use: No    Alcohol/week: 0.0 standard drinks    Comment: less than 5 per week  . Drug use: No     Family Hx: The patient's family history includes Colon cancer in his mother; Dementia in his mother; Lung cancer in his paternal grandfather; Parkinsonism (age of onset: 73) in his father. There is no history of Liver disease, Kidney disease, or Esophageal cancer.  ROS:   Please see the history of present illness.    All other systems reviewed and are negative.   Prior CV studies:   The following studies were reviewed  today:   Labs/Other Tests and Data Reviewed:    EKG:  No ECG reviewed.  Recent Labs: 08/10/2019: ALT 4; BUN 18; Creatinine, Ser 1.04; Potassium 4.1; Sodium 142   Recent Lipid Panel Lab Results  Component Value Date/Time   CHOL 203 (H) 03/02/2014 09:47 AM   TRIG 41.0 03/02/2014 09:47 AM   HDL 76.90 03/02/2014 09:47 AM   CHOLHDL 3 03/02/2014 09:47 AM   LDLCALC 118 (H) 03/02/2014 09:47 AM   LDLDIRECT 132.9 11/26/2007 01:15 PM    Wt Readings from Last 3 Encounters:  09/10/19 142 lb 3.2 oz (64.5 kg)  08/10/19 148 lb 6.4 oz (67.3 kg)  09/15/18 162 lb (73.5 kg)     Objective:    Vital Signs:  BP 130/69  Pulse (!) 53   Ht 5\' 8"  (1.727 m)   Wt 142 lb 3.2 oz (64.5 kg)   BMI 21.62 kg/m    VITAL SIGNS:  reviewed  ASSESSMENT & PLAN:    Bradycardia- This has been noted in the past.  H he is reportedly increasingly weak.  I am not sure if this is secondary to bradycardia or his Parkinson's.  I think we could stop his metoprolol, I doubt that 25 mg once a day is adding much to his blood pressure control which seems to be improved with the increase in his Norvasc.  Parkinson's Per PCP- progressive disability.  COVID-19 Education: The signs and symptoms of COVID-19 were discussed with the patient and how to seek care for testing (follow up with PCP or arrange E-visit).  The importance of social distancing was discussed today.  Time:   Today, I have spent 10 minutes with the patient with telehealth technology discussing the above problems.     Medication Adjustments/Labs and Tests Ordered: Current medicines are reviewed at length with the patient today.  Concerns regarding medicines are outlined above.   Tests Ordered: No orders of the defined types were placed in this encounter.   Medication Changes: No orders of the defined types were placed in this encounter.   Follow Up:  Virtual Visit  Two weeks with me.  Stop metoprolol.  Angelena Form, PA-C  09/10/2019  10:44 AM    [facility]

## 2019-09-13 DIAGNOSIS — M6281 Muscle weakness (generalized): Secondary | ICD-10-CM | POA: Diagnosis not present

## 2019-09-13 DIAGNOSIS — R2681 Unsteadiness on feet: Secondary | ICD-10-CM | POA: Diagnosis not present

## 2019-09-14 DIAGNOSIS — Z20828 Contact with and (suspected) exposure to other viral communicable diseases: Secondary | ICD-10-CM | POA: Diagnosis not present

## 2019-09-14 DIAGNOSIS — R2681 Unsteadiness on feet: Secondary | ICD-10-CM | POA: Diagnosis not present

## 2019-09-14 DIAGNOSIS — M6281 Muscle weakness (generalized): Secondary | ICD-10-CM | POA: Diagnosis not present

## 2019-09-20 ENCOUNTER — Telehealth: Payer: Self-pay

## 2019-09-20 ENCOUNTER — Encounter: Payer: Self-pay | Admitting: Cardiology

## 2019-09-20 NOTE — Telephone Encounter (Signed)
Called the nursing home to see if they could send over the bp readings. I was on hold for a while. I will try to call again tomorrow.

## 2019-09-20 NOTE — Telephone Encounter (Signed)
This encounter was created in error - please disregard.

## 2019-09-20 NOTE — Telephone Encounter (Signed)
New Message:      Son said [name] stopped pt's Metoprolol on 07-10-20. He wanted [name] to know on 08-10-19- pt had to see his primary doctor, because his blood pressure was running high.

## 2019-09-20 NOTE — Telephone Encounter (Signed)
Called son, he states that he wanted Lurena Joiner to be aware that recently he was taken off of the Metoprolol, before that visit with Lurena Joiner his dad had high blood pressure a month before and then was taken off the medication. He states that the facility did not let him know all the correct information, and he wanted Lurena Joiner to be aware before the visit on Wednesday. Advised we should get blood pressure log from facility but would have Luke's assistant see if they could make sure they had correct blood pressures.   He also states at times him and his dad get their appointments switched around on our end, because they are both patients here and states that he does not want any medical confusion. I advised I would notify them of this and apologized for any confusion or inconvenience   Son had no other questions or concerns.

## 2019-09-21 DIAGNOSIS — Z20828 Contact with and (suspected) exposure to other viral communicable diseases: Secondary | ICD-10-CM | POA: Diagnosis not present

## 2019-09-21 NOTE — Telephone Encounter (Signed)
Spoke with Camera operator at [facility] where patient resides; and requested she fax over patients blood pressure readings. I will check the fax at the end of clinic to see if we received them. Patient has an appt virtually scheduled for tomorrow.

## 2019-09-22 ENCOUNTER — Encounter: Payer: Self-pay | Admitting: Cardiology

## 2019-09-22 ENCOUNTER — Telehealth: Payer: Self-pay

## 2019-09-22 ENCOUNTER — Telehealth (INDEPENDENT_AMBULATORY_CARE_PROVIDER_SITE_OTHER): Payer: PPO | Admitting: Cardiology

## 2019-09-22 VITALS — BP 151/84 | HR 63 | Ht 68.0 in | Wt 142.0 lb

## 2019-09-22 DIAGNOSIS — R001 Bradycardia, unspecified: Secondary | ICD-10-CM

## 2019-09-22 DIAGNOSIS — I10 Essential (primary) hypertension: Secondary | ICD-10-CM

## 2019-09-22 DIAGNOSIS — G20 Parkinson's disease: Secondary | ICD-10-CM

## 2019-09-22 NOTE — Progress Notes (Signed)
Virtual Visit via Telephone Note   This visit type was conducted due to national recommendations for restrictions regarding the COVID-19 Pandemic (e.g. social distancing) in an effort to limit this patient's exposure and mitigate transmission in our community.  Due to his co-morbid illnesses, this patient is at least at moderate risk for complications without adequate follow up.  This format is felt to be most appropriate for this patient at this time.  The patient did not have access to video technology/had technical difficulties with video requiring transitioning to audio format only (telephone).  All issues noted in this document were discussed and addressed.  No physical exam could be performed with this format.  Please refer to the patient's chart for his  consent to telehealth for [facility].   Date:  09/22/2019   ID:  [name]., DOB 02-01-1946, MRN [id number]  Patient Location: [facility] Provider Location: Home  PCP:  Eulas Post, MD  Cardiologist:  Dr Gwenlyn Found Electrophysiologist:  None   Evaluation Performed:  Follow-Up Visit  Chief Complaint:  Weakness, back pain  History of Present Illness:    [name]. is a 74 y.o. male with a history of HTN and Parkinson's.  He is a resident of a SNF.  We contacted him for a routine follow up 09/09/2018.  He complained of weakness and [name] his nurse confirmed she had noted gradually increasing fatigue. I noted then that his HR was in the 40-50 range.  I suggested he stop his metoprolol 25 mg BID and he was contacted today for follow up.  I again spoke to the patient on speaker phone with teresa present.  She felt like she noted some improvement since stopping the metoprolol.  For the patient it's seem a little more difficult to tell- he still has back pain and weakness.  His B/P is acceptable- January reading showed a systolic B/P running [id number] systolic.  His HR is now in the 60's.   The patient does not  have symptoms concerning for COVID-19 infection (fever, chills, cough, or new shortness of breath).    Past Medical History:  Diagnosis Date  . Abdominal pain, right lower quadrant   . Abnormality of gait 05/16/2016  . Acute prostatitis   . Anxiety   . Constipation   . Degenerative disc disease   . Depression   . Diverticulosis of colon (without mention of hemorrhage)   . Essential and other specified forms of tremor   . Family history of malignant neoplasm of gastrointestinal tract   . Fibromyalgia   . Ganglion and cyst of synovium, tendon, and bursa   . GERD (gastroesophageal reflux disease)   . Hemarthrosis, upper arm   . Hyperlipidemia   . Hypersomnia with sleep apnea, unspecified   . Hypertension   . Hypopotassemia   . Localized osteoarthrosis not specified whether primary or secondary, lower leg   . Lumbago   . Memory difficulties 04/22/2013  . Nocturia   . Osteoporosis, unspecified   . Other testicular hypofunction   . Parkinson disease ([place]) 05/16/2016  . Right inguinal hernia   . Sleep apnea    last sleep study 11/11 on chart- Bipap with settings of 4 per  patient  . Spinal stenosis, lumbar region, without neurogenic claudication   . Syncope and collapse   . Unspecified adverse effect of unspecified drug, medicinal and biological substance   . Unspecified gastritis and gastroduodenitis without mention of hemorrhage   . Urinary  frequency    Past Surgical History:  Procedure Laterality Date  . BACK SURGERY     x 3  . [place]  . INGUINAL HERNIA REPAIR  07/19/2011   Procedure: LAPAROSCOPIC INGUINAL HERNIA;  Surgeon: Odis Hollingshead, MD;  Location: [facility];  Service: General;  Laterality: Right;  Laparoscopic Repair of Recurrent Right  Ingunial Hernia with Mesh  . microdisectomy  03/02/2003, 06/29/2003, 12/24/2005  . TONSILLECTOMY       Current Meds  Medication Sig  . amLODipine (NORVASC) 10 MG tablet Take 1 tablet (10 mg total) by mouth daily.  .  Calcium Carbonate-Vitamin D (CALCIUM 600+D) 600-400 MG-UNIT tablet Take 1 tablet by mouth daily.  . carbidopa-levodopa (SINEMET IR) 25-100 MG tablet TAKE 1 & 1/2 TABLETS BY MOUTH EACH MORNING. TAKE (1) TABLET BY MOUTH MIDDAY AND IN THE EVENING.  Marland Kitchen Cholecalciferol (VITAMIN D3) 125 MCG (5000 UT) CAPS Take 1 capsule by mouth daily.  . citalopram (CELEXA) 20 MG tablet Take 1 tablet (20 mg total) by mouth daily.  . diclofenac Sodium (VOLTAREN) 1 % GEL Apply 2 g topically as needed (lateral hip pain; for bursitis).  . entacapone (COMTAN) 200 MG tablet TAKE (1) TABLET BY MOUTH (3) TIMES DAILY.  . famotidine (PEPCID) 20 MG tablet Take 1 tablet (20 mg total) by mouth 2 (two) times daily.  Marland Kitchen KLOR-CON M20 20 MEQ tablet TAKE 3 TABLETS (60 MEQ TOTAL) BY MOUTH DAILY.  Marland Kitchen Krill Oil 1000 MG CAPS Take 1 capsule by mouth 2 (two) times daily.  Marland Kitchen losartan (COZAAR) 100 MG tablet TAKE 1 TABLET (100 MG TOTAL) BY MOUTH DAILY.  Marland Kitchen MAGNESIUM CITRATE PO Take 250 mg by mouth 2 (two) times daily.   . memantine (NAMENDA) 10 MG tablet TAKE 1 TABLET (10 MG TOTAL) BY MOUTH 2 (TWO) TIMES DAILY.  Marland Kitchen polyethylene glycol (MIRALAX / GLYCOLAX) 17 g packet Take 17 g by mouth daily as needed.  . Probiotic Product (ALIGN) 4 MG CAPS Take by mouth daily.   . QUEtiapine (SEROQUEL) 25 MG tablet Take 1 tablet (25 mg total) by mouth at bedtime.  . vitamin B-12 (CYANOCOBALAMIN) 50 MCG tablet Take 50 mcg by mouth daily.      Allergies:   Nucynta [tapentadol], Pregabalin, and Exelon [rivastigmine]   Social History   Tobacco Use  . Smoking status: Never Smoker  . Smokeless tobacco: Never Used  Substance Use Topics  . Alcohol use: No    Alcohol/week: 0.0 standard drinks    Comment: less than 5 per week  . Drug use: No     Family Hx: The patient's family history includes Colon cancer in his mother; Dementia in his mother; Lung cancer in his paternal grandfather; Parkinsonism (age of onset: 75) in his father. There is no history of Liver  disease, Kidney disease, or Esophageal cancer.  ROS:   Please see the history of present illness.    All other systems reviewed and are negative.   Prior CV studies:   The following studies were reviewed today: None  Labs/Other Tests and Data Reviewed:    EKG:  An ECG dated 02/24/2018 was personally reviewed today and demonstrated:  Sinus bradycardia- HR 48, RV conduction delay, LVH  Recent Labs: 08/10/2019: ALT 4; BUN 18; Creatinine, Ser 1.04; Potassium 4.1; Sodium 142   Recent Lipid Panel Lab Results  Component Value Date/Time   CHOL 203 (H) 03/02/2014 09:47 AM   TRIG 41.0 03/02/2014 09:47 AM   HDL 76.90 03/02/2014  09:47 AM   CHOLHDL 3 03/02/2014 09:47 AM   LDLCALC 118 (H) 03/02/2014 09:47 AM   LDLDIRECT 132.9 11/26/2007 01:15 PM    Wt Readings from Last 3 Encounters:  09/22/19 142 lb (64.4 kg)  09/10/19 142 lb 3.2 oz (64.5 kg)  08/10/19 148 lb 6.4 oz (67.3 kg)     Objective:    Vital Signs:  BP (!) 151/84   Pulse 63   Ht 5\' 8"  (1.727 m)   Wt 142 lb (64.4 kg)   BMI 21.59 kg/m    VITAL SIGNS:  reviewed  ASSESSMENT & PLAN:    Bradycardia- This has been noted in the past.  He was reportedly increasingly weak.  I am not sure if this is secondary to bradycardia or his Parkinson's but there does seem to be some improvement off beta blocker.   HTN- controlled  Parkinson's Per PCP- progressive disability.  COVID-19 Education: The signs and symptoms of COVID-19 were discussed with the patient and how to seek care for testing (follow up with PCP or arrange E-visit).  The importance of social distancing was discussed today.  Time:   Today, I have spent 10 minutes with the patient with telehealth technology discussing the above problems.     Medication Adjustments/Labs and Tests Ordered: Current medicines are reviewed at length with the patient today.  Concerns regarding medicines are outlined above.   Tests Ordered: No orders of the defined types were placed  in this encounter.   Medication Changes: No orders of the defined types were placed in this encounter.   Follow Up:  Virtual Visit  Dr Gwenlyn Found 6 months  Signed, Kerin Ransom, PA-C  09/22/2019 11:41 AM    [place]

## 2019-09-22 NOTE — Telephone Encounter (Signed)

## 2019-09-22 NOTE — Patient Instructions (Signed)
Medication Instructions:  Your physician recommends that you continue on your current medications as directed. Please refer to the Current Medication list given to you today. *If you need a refill on your cardiac medications before your next appointment, please call your pharmacy*  Lab Work: None  If you have labs (blood work) drawn today and your tests are completely normal, you will receive your results only by: Marland Kitchen MyChart Message (if you have MyChart) OR . A paper copy in the mail If you have any lab test that is abnormal or we need to change your treatment, we will call you to review the results.  Testing/Procedures: None   Follow-Up: At [facility], you and your health needs are our priority.  As part of our continuing mission to provide you with exceptional heart care, we have created designated Provider Care Teams.  These Care Teams include your primary Cardiologist (physician) and Advanced Practice Providers (APPs -  Physician Assistants and Nurse Practitioners) who all work together to provide you with the care you need, when you need it.  Your next appointment:   6 month(s)  The format for your next appointment:   Virtual Visit   Provider:   Quay Burow, MD  Other Instructions

## 2019-09-29 DIAGNOSIS — H04123 Dry eye syndrome of bilateral lacrimal glands: Secondary | ICD-10-CM | POA: Diagnosis not present

## 2019-09-29 DIAGNOSIS — H0101A Ulcerative blepharitis right eye, upper and lower eyelids: Secondary | ICD-10-CM | POA: Diagnosis not present

## 2019-10-13 ENCOUNTER — Telehealth: Payer: Self-pay | Admitting: Neurology

## 2019-10-13 NOTE — Telephone Encounter (Signed)
Patient son brian called stating he Is needing a letter in regards to the patient state of mind  for their estate

## 2019-10-13 NOTE — Telephone Encounter (Signed)
I called pts son Aaron Edelman at [phone] needing a letter for his fathers state of mind and for his estate. I stated message will be sent to Dr. Jannifer Franklin and he can review on Monday. Aaron Edelman verbalized understanding.

## 2019-10-15 NOTE — Telephone Encounter (Signed)
I called the son.  The patient was last seen in May 2020, he never called back to set up a revisit appointment.  At the time he was last evaluated, he was having problems with delusional thinking and hallucinations, he was started on Nuplazid, but could not afford the medication.  The son wishes me to write a letter indicating that he is of sound mind and can make financial, legal, and medical decisions on his own.  I am not sure that this is the case.  I will need to see the patient back in the office, we will reassess things in regards to his physical and cognitive capacities at that time.

## 2019-10-18 NOTE — Telephone Encounter (Signed)
I reached out to the pt's son and scheduled for 12/14/2019 at 12 pm.

## 2019-10-20 ENCOUNTER — Encounter: Payer: Self-pay | Admitting: Family Medicine

## 2019-11-19 ENCOUNTER — Telehealth: Payer: Self-pay | Admitting: Neurology

## 2019-11-19 DIAGNOSIS — G20 Parkinson's disease: Secondary | ICD-10-CM

## 2019-11-19 NOTE — Telephone Encounter (Signed)
Pt's son [name] called wanting to know if a updated prescription for a lift chair can be faxed in for the pt to [facility]. Fax number is [phone] Please advise.

## 2019-11-22 NOTE — Telephone Encounter (Signed)
Left vm for pts son [name] that pt has an appt 12/13/2019 with Dr.Willis. Pt was last seen 12/2018. I stated Dr. Jannifer Franklin will be back in office on Monday to address the lift chair prescription.  VM was left for son.

## 2019-11-29 NOTE — Telephone Encounter (Signed)
A prescription for a lift chair will be written.

## 2019-11-29 NOTE — Addendum Note (Signed)
Addended by: Kathrynn Ducking on: 11/29/2019 07:22 AM   Modules accepted: Orders

## 2019-11-30 NOTE — Telephone Encounter (Signed)
Lift chair prescription fax by Dr .Jannifer Franklin on 11/29/2019 to [phone] and confirmed.

## 2019-12-14 ENCOUNTER — Ambulatory Visit (INDEPENDENT_AMBULATORY_CARE_PROVIDER_SITE_OTHER): Payer: PPO | Admitting: Neurology

## 2019-12-14 ENCOUNTER — Encounter: Payer: Self-pay | Admitting: Neurology

## 2019-12-14 ENCOUNTER — Other Ambulatory Visit: Payer: Self-pay

## 2019-12-14 VITALS — BP 142/70 | HR 64 | Temp 97.8°F | Wt 143.0 lb

## 2019-12-14 DIAGNOSIS — G20 Parkinson's disease: Secondary | ICD-10-CM

## 2019-12-14 DIAGNOSIS — R269 Unspecified abnormalities of gait and mobility: Secondary | ICD-10-CM

## 2019-12-14 DIAGNOSIS — R413 Other amnesia: Secondary | ICD-10-CM | POA: Diagnosis not present

## 2019-12-14 MED ORDER — QUETIAPINE FUMARATE 25 MG PO TABS: ORAL_TABLET | ORAL | 2 refills | Status: DC

## 2019-12-14 NOTE — Progress Notes (Signed)
Reason for visit: Parkinson's disease, dementia, gait disorder  [name]. is an 74 y.o. male  History of present illness:  [name] is a 74 year old right-handed white male with a history of Parkinson's disease associated with a memory disorder and a gait disorder.  The patient actually has progressed very little with his mobility issues.  He remains on Sinemet taking 1.5 tablets of the 25/100 mg tablets in the morning, 1 at midday and 1 in the evening.  The patient is having some hallucinations that seem to worsen around 5 PM.  He takes Seroquel at bedtime.  Nuplazid was not used because of the cost.  The patient is reporting some bilateral hip pain that is worse with walking.  Several years ago he did receive injections for possible bursitis, x-rays of the hip have been unremarkable.  He denies any significant issues with swallowing.  He does have some memory issues.  He continues to have trouble with urinary incontinence, he has been on a multitude of medications through urology and has had posterior tibial stimulation without benefit.  He returns for an evaluation.  Past Medical History:  Diagnosis Date  . Abdominal pain, right lower quadrant   . Abnormality of gait 05/16/2016  . Acute prostatitis   . Anxiety   . Constipation   . Degenerative disc disease   . Depression   . Diverticulosis of colon (without mention of hemorrhage)   . Essential and other specified forms of tremor   . Family history of malignant neoplasm of gastrointestinal tract   . Fibromyalgia   . Ganglion and cyst of synovium, tendon, and bursa   . GERD (gastroesophageal reflux disease)   . Hemarthrosis, upper arm   . Hyperlipidemia   . Hypersomnia with sleep apnea, unspecified   . Hypertension   . Hypopotassemia   . Localized osteoarthrosis not specified whether primary or secondary, lower leg   . Lumbago   . Memory difficulties 04/22/2013  . Nocturia   . Osteoporosis, unspecified   . Other  testicular hypofunction   . Parkinson disease ([place]) 05/16/2016  . Right inguinal hernia   . Sleep apnea    last sleep study 11/11 on chart- Bipap with settings of 4 per  patient  . Spinal stenosis, lumbar region, without neurogenic claudication   . Syncope and collapse   . Unspecified adverse effect of unspecified drug, medicinal and biological substance   . Unspecified gastritis and gastroduodenitis without mention of hemorrhage   . Urinary frequency     Past Surgical History:  Procedure Laterality Date  . BACK SURGERY     x 3  . [place]  . INGUINAL HERNIA REPAIR  07/19/2011   Procedure: LAPAROSCOPIC INGUINAL HERNIA;  Surgeon: Odis Hollingshead, MD;  Location: [facility];  Service: General;  Laterality: Right;  Laparoscopic Repair of Recurrent Right  Ingunial Hernia with Mesh  . microdisectomy  03/02/2003, 06/29/2003, 12/24/2005  . TONSILLECTOMY      Family History  Problem Relation Age of Onset  . Colon cancer Mother   . Dementia Mother   . Parkinsonism Father 12  . Lung cancer Paternal Grandfather        smoker  . Liver disease Neg Hx   . Kidney disease Neg Hx   . Esophageal cancer Neg Hx     Social history:  reports that he has never smoked. He has never used smokeless tobacco. He reports that he does not drink alcohol  or use drugs.    Allergies  Allergen Reactions  . Nucynta [Tapentadol] Other (See Comments)    dizziness  . Pregabalin     REACTION: dizziness and mental status changes  . Exelon [Rivastigmine]     abd pain    Medications:  Prior to Admission medications   Medication Sig Start Date End Date Taking? Authorizing Provider  amLODipine (NORVASC) 10 MG tablet Take 1 tablet (10 mg total) by mouth daily. 08/10/19  Yes Burchette, Alinda Sierras, MD  Calcium Carbonate-Vitamin D (CALCIUM 600+D) 600-400 MG-UNIT tablet Take 1 tablet by mouth daily. 04/10/18  Yes Burchette, Alinda Sierras, MD  carbidopa-levodopa (SINEMET IR) 25-100 MG tablet TAKE 1 & 1/2 TABLETS  BY MOUTH EACH MORNING. TAKE (1) TABLET BY MOUTH MIDDAY AND IN THE EVENING. 10/14/18  Yes Kathrynn Ducking, MD  Cholecalciferol (VITAMIN D3) 125 MCG (5000 UT) CAPS Take 1 capsule by mouth daily. 08/23/19  Yes [provider]  citalopram (CELEXA) 20 MG tablet Take 1 tablet (20 mg total) by mouth daily. 02/03/18  Yes Burchette, Alinda Sierras, MD  diclofenac Sodium (VOLTAREN) 1 % GEL Apply 2 g topically as needed (lateral hip pain; for bursitis).   Yes [provider]  entacapone (COMTAN) 200 MG tablet TAKE (1) TABLET BY MOUTH (3) TIMES DAILY. 11/13/18  Yes Kathrynn Ducking, MD  famotidine (PEPCID) 20 MG tablet Take 1 tablet (20 mg total) by mouth 2 (two) times daily. 12/14/18  Yes Burchette, Alinda Sierras, MD  KLOR-CON M20 20 MEQ tablet TAKE 3 TABLETS (60 MEQ TOTAL) BY MOUTH DAILY. 01/02/18  Yes Burchette, Alinda Sierras, MD  Krill Oil 1000 MG CAPS Take 1 capsule by mouth 2 (two) times daily.   Yes [provider]  losartan (COZAAR) 100 MG tablet TAKE 1 TABLET (100 MG TOTAL) BY MOUTH DAILY. 09/16/17  Yes Burchette, Alinda Sierras, MD  MAGNESIUM CITRATE PO Take 250 mg by mouth 2 (two) times daily.    Yes [provider]  memantine (NAMENDA) 10 MG tablet TAKE 1 TABLET (10 MG TOTAL) BY MOUTH 2 (TWO) TIMES DAILY. 12/08/17  Yes Kathrynn Ducking, MD  polyethylene glycol (MIRALAX / GLYCOLAX) 17 g packet Take 17 g by mouth daily as needed.   Yes [provider]  Probiotic Product (ALIGN) 4 MG CAPS Take by mouth daily.    Yes [provider]  QUEtiapine (SEROQUEL) 25 MG tablet Take 1 tablet (25 mg total) by mouth at bedtime. 01/26/19  Yes Kathrynn Ducking, MD  vitamin B-12 (CYANOCOBALAMIN) 50 MCG tablet Take 50 mcg by mouth daily.    Yes [provider]    ROS:  Out of a complete 14 system review of symptoms, the patient complains only of the following symptoms, and all other reviewed systems are negative.  Urinary incontinence Memory problems Hallucinations Hip  pain  Blood pressure (!) 142/70, pulse 64, temperature 97.8 F (36.6 C), weight 143 lb (64.9 kg).  Physical Exam  General: The patient is alert and cooperative at the time of the examination.  Skin: No significant peripheral edema is noted.   Neurologic Exam  Mental status: The patient is alert and oriented x 3 at the time of the examination. The Mini-Mental status examination done today shows a total score 23/30.  The patient is able to name 7 four-legged animals in 60 seconds.   Cranial nerves: Facial symmetry is present. Speech is normal, no aphasia or dysarthria is noted. Extraocular movements are full. Visual fields are full.  Masking of the face is seen.  Motor: The patient has good strength in all 4 extremities.  Sensory examination: Soft touch sensation is symmetric on the face, arms, and legs.  Coordination: The patient has good finger-nose-finger and heel-to-shin bilaterally.  Gait and station: The patient is able to walk with a walker, he does well with a walker with fairly good stride, posture is stooped.  The patient has a negative Romberg, tandem gait was not attempted.  Reflexes: Deep tendon reflexes are symmetric.   Assessment/Plan:  1.  Parkinson's disease  2.  Dementia  3.  Gait disorder  The patient continues to have hallucinations.  We will increase the Seroquel to 1/2 tablet at 5 PM and 1 tablet at bedtime.  We will keep the Sinemet dosing as is with the Comtan.  The patient is urged to try to walk on a regular basis.  If the hip issue is significant, he should contact his primary care physician to receive another injection in the bursa.  The patient will follow-up here in 5 months.  Jill Alexanders MD 12/14/2019 12:46 PM  [facility] [address] [place], [place] [address]  Phone [phone] Fax [phone]

## 2019-12-22 ENCOUNTER — Other Ambulatory Visit: Payer: Self-pay | Admitting: Family Medicine

## 2019-12-24 DIAGNOSIS — I739 Peripheral vascular disease, unspecified: Secondary | ICD-10-CM | POA: Diagnosis not present

## 2019-12-24 DIAGNOSIS — Q845 Enlarged and hypertrophic nails: Secondary | ICD-10-CM | POA: Diagnosis not present

## 2019-12-24 DIAGNOSIS — B351 Tinea unguium: Secondary | ICD-10-CM | POA: Diagnosis not present

## 2019-12-29 ENCOUNTER — Other Ambulatory Visit: Payer: Self-pay | Admitting: Family Medicine

## 2020-01-11 ENCOUNTER — Encounter: Payer: Self-pay | Admitting: Family Medicine

## 2020-01-11 DIAGNOSIS — Z7689 Persons encountering health services in other specified circumstances: Secondary | ICD-10-CM | POA: Diagnosis not present

## 2020-01-19 ENCOUNTER — Other Ambulatory Visit: Payer: Self-pay | Admitting: Family Medicine

## 2020-01-27 DIAGNOSIS — R2689 Other abnormalities of gait and mobility: Secondary | ICD-10-CM | POA: Diagnosis not present

## 2020-01-27 DIAGNOSIS — R2681 Unsteadiness on feet: Secondary | ICD-10-CM | POA: Diagnosis not present

## 2020-01-31 DIAGNOSIS — R2681 Unsteadiness on feet: Secondary | ICD-10-CM | POA: Diagnosis not present

## 2020-01-31 DIAGNOSIS — R2689 Other abnormalities of gait and mobility: Secondary | ICD-10-CM | POA: Diagnosis not present

## 2020-02-01 DIAGNOSIS — R2681 Unsteadiness on feet: Secondary | ICD-10-CM | POA: Diagnosis not present

## 2020-02-01 DIAGNOSIS — R2689 Other abnormalities of gait and mobility: Secondary | ICD-10-CM | POA: Diagnosis not present

## 2020-02-03 DIAGNOSIS — R2681 Unsteadiness on feet: Secondary | ICD-10-CM | POA: Diagnosis not present

## 2020-02-03 DIAGNOSIS — R2689 Other abnormalities of gait and mobility: Secondary | ICD-10-CM | POA: Diagnosis not present

## 2020-02-08 DIAGNOSIS — R2689 Other abnormalities of gait and mobility: Secondary | ICD-10-CM | POA: Diagnosis not present

## 2020-02-08 DIAGNOSIS — R2681 Unsteadiness on feet: Secondary | ICD-10-CM | POA: Diagnosis not present

## 2020-02-09 DIAGNOSIS — R2681 Unsteadiness on feet: Secondary | ICD-10-CM | POA: Diagnosis not present

## 2020-02-09 DIAGNOSIS — R2689 Other abnormalities of gait and mobility: Secondary | ICD-10-CM | POA: Diagnosis not present

## 2020-02-10 DIAGNOSIS — R2681 Unsteadiness on feet: Secondary | ICD-10-CM | POA: Diagnosis not present

## 2020-02-10 DIAGNOSIS — R2689 Other abnormalities of gait and mobility: Secondary | ICD-10-CM | POA: Diagnosis not present

## 2020-02-11 DIAGNOSIS — R2689 Other abnormalities of gait and mobility: Secondary | ICD-10-CM | POA: Diagnosis not present

## 2020-02-11 DIAGNOSIS — R2681 Unsteadiness on feet: Secondary | ICD-10-CM | POA: Diagnosis not present

## 2020-02-15 DIAGNOSIS — R2681 Unsteadiness on feet: Secondary | ICD-10-CM | POA: Diagnosis not present

## 2020-02-15 DIAGNOSIS — R2689 Other abnormalities of gait and mobility: Secondary | ICD-10-CM | POA: Diagnosis not present

## 2020-02-16 ENCOUNTER — Telehealth: Payer: Self-pay | Admitting: Neurology

## 2020-02-16 NOTE — Telephone Encounter (Signed)
I call pts son Aaron Edelman about needing a letter for his father. I stated message will be sent to Dr. Jannifer Franklin to review. I stated we are closed on Monday and Dr. Jannifer Franklin will be sent a message, he verbalized understanding.

## 2020-02-16 NOTE — Telephone Encounter (Signed)
Umstead,[name](son on DPR)son asking for a letter to be prepared re: pt needing someone over his estate and other financial matters due to mental status.  Please call re: this request.

## 2020-02-17 DIAGNOSIS — R2689 Other abnormalities of gait and mobility: Secondary | ICD-10-CM | POA: Diagnosis not present

## 2020-02-17 DIAGNOSIS — R2681 Unsteadiness on feet: Secondary | ICD-10-CM | POA: Diagnosis not present

## 2020-02-17 NOTE — Telephone Encounter (Signed)
I called the son, left a message, I will call back later.

## 2020-02-18 NOTE — Telephone Encounter (Signed)
I called and left a message.  I will call back later to see if I can reach the patient's son.

## 2020-02-21 ENCOUNTER — Encounter: Payer: Self-pay | Admitting: Neurology

## 2020-02-21 DIAGNOSIS — R2681 Unsteadiness on feet: Secondary | ICD-10-CM | POA: Diagnosis not present

## 2020-02-21 DIAGNOSIS — R2689 Other abnormalities of gait and mobility: Secondary | ICD-10-CM | POA: Diagnosis not present

## 2020-02-21 NOTE — Telephone Encounter (Signed)
I called and talk with the son.  The patient does have a mild dementia, he does have hallucinations.  His son [name] is a cotrustee for his estate, the attorney is Winona Legato.  I will go ahead and dictate a letter regarding his cognitive status.  They wish to have a copy of the letter sent to the office of Winona Legato and 1 to his son Ruxin Ransome.  [facility] [address] [place], [place] [address]

## 2020-02-22 DIAGNOSIS — R2689 Other abnormalities of gait and mobility: Secondary | ICD-10-CM | POA: Diagnosis not present

## 2020-02-22 DIAGNOSIS — R2681 Unsteadiness on feet: Secondary | ICD-10-CM | POA: Diagnosis not present

## 2020-02-22 NOTE — Telephone Encounter (Signed)
Copy of letter put in mail to pts son Zygmunt Mcglinn for lawyer. The other copy of letter given to Hilda Blades settle to send to lawyer per son request.

## 2020-02-23 ENCOUNTER — Telehealth: Payer: Self-pay | Admitting: Family Medicine

## 2020-02-23 NOTE — Telephone Encounter (Signed)
These were all faxed and signed see media nothing further needed

## 2020-02-23 NOTE — Telephone Encounter (Signed)
Pt is calling in stating that the pw dated 01/06/2020,01/20/2020 and 12/21/2019 was not signed and they need it by today faxed to [phone].

## 2020-02-24 DIAGNOSIS — R2689 Other abnormalities of gait and mobility: Secondary | ICD-10-CM | POA: Diagnosis not present

## 2020-02-24 DIAGNOSIS — R2681 Unsteadiness on feet: Secondary | ICD-10-CM | POA: Diagnosis not present

## 2020-02-29 DIAGNOSIS — R2681 Unsteadiness on feet: Secondary | ICD-10-CM | POA: Diagnosis not present

## 2020-02-29 DIAGNOSIS — R2689 Other abnormalities of gait and mobility: Secondary | ICD-10-CM | POA: Diagnosis not present

## 2020-03-01 DIAGNOSIS — R2689 Other abnormalities of gait and mobility: Secondary | ICD-10-CM | POA: Diagnosis not present

## 2020-03-01 DIAGNOSIS — R2681 Unsteadiness on feet: Secondary | ICD-10-CM | POA: Diagnosis not present

## 2020-03-06 DIAGNOSIS — R2689 Other abnormalities of gait and mobility: Secondary | ICD-10-CM | POA: Diagnosis not present

## 2020-03-06 DIAGNOSIS — R2681 Unsteadiness on feet: Secondary | ICD-10-CM | POA: Diagnosis not present

## 2020-03-07 DIAGNOSIS — R2689 Other abnormalities of gait and mobility: Secondary | ICD-10-CM | POA: Diagnosis not present

## 2020-03-07 DIAGNOSIS — R2681 Unsteadiness on feet: Secondary | ICD-10-CM | POA: Diagnosis not present

## 2020-03-08 DIAGNOSIS — R2681 Unsteadiness on feet: Secondary | ICD-10-CM | POA: Diagnosis not present

## 2020-03-08 DIAGNOSIS — R2689 Other abnormalities of gait and mobility: Secondary | ICD-10-CM | POA: Diagnosis not present

## 2020-03-09 DIAGNOSIS — R2689 Other abnormalities of gait and mobility: Secondary | ICD-10-CM | POA: Diagnosis not present

## 2020-03-09 DIAGNOSIS — R2681 Unsteadiness on feet: Secondary | ICD-10-CM | POA: Diagnosis not present

## 2020-03-13 DIAGNOSIS — R2681 Unsteadiness on feet: Secondary | ICD-10-CM | POA: Diagnosis not present

## 2020-03-13 DIAGNOSIS — R2689 Other abnormalities of gait and mobility: Secondary | ICD-10-CM | POA: Diagnosis not present

## 2020-03-14 ENCOUNTER — Telehealth: Payer: Self-pay | Admitting: Family Medicine

## 2020-03-14 NOTE — Telephone Encounter (Signed)
Pt son called to say they need a podiatrist form sent over [facility]. [phone]  Please advise

## 2020-03-15 DIAGNOSIS — R2689 Other abnormalities of gait and mobility: Secondary | ICD-10-CM | POA: Diagnosis not present

## 2020-03-15 DIAGNOSIS — R2681 Unsteadiness on feet: Secondary | ICD-10-CM | POA: Diagnosis not present

## 2020-03-15 NOTE — Telephone Encounter (Signed)
Form sent to [facility] that there is no diagnosis in his chart that supports this

## 2020-03-16 DIAGNOSIS — R2689 Other abnormalities of gait and mobility: Secondary | ICD-10-CM | POA: Diagnosis not present

## 2020-03-16 DIAGNOSIS — R2681 Unsteadiness on feet: Secondary | ICD-10-CM | POA: Diagnosis not present

## 2020-03-20 DIAGNOSIS — R2689 Other abnormalities of gait and mobility: Secondary | ICD-10-CM | POA: Diagnosis not present

## 2020-03-20 DIAGNOSIS — R2681 Unsteadiness on feet: Secondary | ICD-10-CM | POA: Diagnosis not present

## 2020-03-22 DIAGNOSIS — R2681 Unsteadiness on feet: Secondary | ICD-10-CM | POA: Diagnosis not present

## 2020-03-22 DIAGNOSIS — R2689 Other abnormalities of gait and mobility: Secondary | ICD-10-CM | POA: Diagnosis not present

## 2020-03-24 DIAGNOSIS — R2681 Unsteadiness on feet: Secondary | ICD-10-CM | POA: Diagnosis not present

## 2020-03-24 DIAGNOSIS — R2689 Other abnormalities of gait and mobility: Secondary | ICD-10-CM | POA: Diagnosis not present

## 2020-03-27 DIAGNOSIS — R2681 Unsteadiness on feet: Secondary | ICD-10-CM | POA: Diagnosis not present

## 2020-03-27 DIAGNOSIS — R2689 Other abnormalities of gait and mobility: Secondary | ICD-10-CM | POA: Diagnosis not present

## 2020-03-30 DIAGNOSIS — R2681 Unsteadiness on feet: Secondary | ICD-10-CM | POA: Diagnosis not present

## 2020-03-30 DIAGNOSIS — M6281 Muscle weakness (generalized): Secondary | ICD-10-CM | POA: Diagnosis not present

## 2020-04-03 DIAGNOSIS — R2681 Unsteadiness on feet: Secondary | ICD-10-CM | POA: Diagnosis not present

## 2020-04-03 DIAGNOSIS — M6281 Muscle weakness (generalized): Secondary | ICD-10-CM | POA: Diagnosis not present

## 2020-04-04 DIAGNOSIS — R2681 Unsteadiness on feet: Secondary | ICD-10-CM | POA: Diagnosis not present

## 2020-04-04 DIAGNOSIS — M6281 Muscle weakness (generalized): Secondary | ICD-10-CM | POA: Diagnosis not present

## 2020-04-07 DIAGNOSIS — R2681 Unsteadiness on feet: Secondary | ICD-10-CM | POA: Diagnosis not present

## 2020-04-07 DIAGNOSIS — M6281 Muscle weakness (generalized): Secondary | ICD-10-CM | POA: Diagnosis not present

## 2020-04-11 DIAGNOSIS — M6281 Muscle weakness (generalized): Secondary | ICD-10-CM | POA: Diagnosis not present

## 2020-04-11 DIAGNOSIS — R2681 Unsteadiness on feet: Secondary | ICD-10-CM | POA: Diagnosis not present

## 2020-04-12 DIAGNOSIS — M6281 Muscle weakness (generalized): Secondary | ICD-10-CM | POA: Diagnosis not present

## 2020-04-12 DIAGNOSIS — R2681 Unsteadiness on feet: Secondary | ICD-10-CM | POA: Diagnosis not present

## 2020-04-14 DIAGNOSIS — M6281 Muscle weakness (generalized): Secondary | ICD-10-CM | POA: Diagnosis not present

## 2020-04-14 DIAGNOSIS — R2681 Unsteadiness on feet: Secondary | ICD-10-CM | POA: Diagnosis not present

## 2020-04-18 DIAGNOSIS — R2681 Unsteadiness on feet: Secondary | ICD-10-CM | POA: Diagnosis not present

## 2020-04-18 DIAGNOSIS — M6281 Muscle weakness (generalized): Secondary | ICD-10-CM | POA: Diagnosis not present

## 2020-04-19 DIAGNOSIS — M6281 Muscle weakness (generalized): Secondary | ICD-10-CM | POA: Diagnosis not present

## 2020-04-19 DIAGNOSIS — R2681 Unsteadiness on feet: Secondary | ICD-10-CM | POA: Diagnosis not present

## 2020-04-20 DIAGNOSIS — M6281 Muscle weakness (generalized): Secondary | ICD-10-CM | POA: Diagnosis not present

## 2020-04-20 DIAGNOSIS — R2681 Unsteadiness on feet: Secondary | ICD-10-CM | POA: Diagnosis not present

## 2020-04-25 DIAGNOSIS — M6281 Muscle weakness (generalized): Secondary | ICD-10-CM | POA: Diagnosis not present

## 2020-04-25 DIAGNOSIS — R2681 Unsteadiness on feet: Secondary | ICD-10-CM | POA: Diagnosis not present

## 2020-04-26 DIAGNOSIS — R2681 Unsteadiness on feet: Secondary | ICD-10-CM | POA: Diagnosis not present

## 2020-04-26 DIAGNOSIS — M6281 Muscle weakness (generalized): Secondary | ICD-10-CM | POA: Diagnosis not present

## 2020-04-28 DIAGNOSIS — M6281 Muscle weakness (generalized): Secondary | ICD-10-CM | POA: Diagnosis not present

## 2020-04-28 DIAGNOSIS — R2681 Unsteadiness on feet: Secondary | ICD-10-CM | POA: Diagnosis not present

## 2020-05-02 DIAGNOSIS — M6281 Muscle weakness (generalized): Secondary | ICD-10-CM | POA: Diagnosis not present

## 2020-05-02 DIAGNOSIS — R2681 Unsteadiness on feet: Secondary | ICD-10-CM | POA: Diagnosis not present

## 2020-05-03 DIAGNOSIS — R2681 Unsteadiness on feet: Secondary | ICD-10-CM | POA: Diagnosis not present

## 2020-05-03 DIAGNOSIS — M6281 Muscle weakness (generalized): Secondary | ICD-10-CM | POA: Diagnosis not present

## 2020-05-05 DIAGNOSIS — M6281 Muscle weakness (generalized): Secondary | ICD-10-CM | POA: Diagnosis not present

## 2020-05-05 DIAGNOSIS — R2681 Unsteadiness on feet: Secondary | ICD-10-CM | POA: Diagnosis not present

## 2020-05-09 DIAGNOSIS — M6281 Muscle weakness (generalized): Secondary | ICD-10-CM | POA: Diagnosis not present

## 2020-05-09 DIAGNOSIS — R2681 Unsteadiness on feet: Secondary | ICD-10-CM | POA: Diagnosis not present

## 2020-05-10 DIAGNOSIS — R2681 Unsteadiness on feet: Secondary | ICD-10-CM | POA: Diagnosis not present

## 2020-05-10 DIAGNOSIS — M6281 Muscle weakness (generalized): Secondary | ICD-10-CM | POA: Diagnosis not present

## 2020-05-12 DIAGNOSIS — M6281 Muscle weakness (generalized): Secondary | ICD-10-CM | POA: Diagnosis not present

## 2020-05-12 DIAGNOSIS — R2681 Unsteadiness on feet: Secondary | ICD-10-CM | POA: Diagnosis not present

## 2020-05-16 ENCOUNTER — Other Ambulatory Visit: Payer: Self-pay

## 2020-05-16 ENCOUNTER — Ambulatory Visit: Payer: PPO | Admitting: Neurology

## 2020-05-16 ENCOUNTER — Encounter: Payer: Self-pay | Admitting: Neurology

## 2020-05-16 VITALS — BP 148/71 | HR 62 | Ht 68.0 in | Wt 149.0 lb

## 2020-05-16 DIAGNOSIS — R413 Other amnesia: Secondary | ICD-10-CM | POA: Diagnosis not present

## 2020-05-16 DIAGNOSIS — R269 Unspecified abnormalities of gait and mobility: Secondary | ICD-10-CM | POA: Diagnosis not present

## 2020-05-16 DIAGNOSIS — G20 Parkinson's disease: Secondary | ICD-10-CM

## 2020-05-16 DIAGNOSIS — M6281 Muscle weakness (generalized): Secondary | ICD-10-CM | POA: Diagnosis not present

## 2020-05-16 DIAGNOSIS — R2681 Unsteadiness on feet: Secondary | ICD-10-CM | POA: Diagnosis not present

## 2020-05-16 DIAGNOSIS — G20A1 Parkinson's disease without dyskinesia, without mention of fluctuations: Secondary | ICD-10-CM

## 2020-05-16 MED ORDER — NUPLAZID 34 MG PO CAPS: 34.0000 mg | ORAL_CAPSULE | Freq: Every day | ORAL | 3 refills | Status: DC

## 2020-05-16 MED ORDER — QUETIAPINE FUMARATE 25 MG PO TABS: ORAL_TABLET | ORAL | 2 refills | Status: DC

## 2020-05-16 MED ORDER — POLYETHYLENE GLYCOL 3350 17 G PO PACK: 17.0000 g | PACK | Freq: Every day | ORAL | 3 refills | Status: DC

## 2020-05-16 NOTE — Progress Notes (Signed)
Reason for visit: Parkinson's disease, dementia, hallucinations  [name]. is an 74 y.o. male  History of present illness:  [name] is a 74 year old right-handed white male with a history of Parkinson's disease and dementia.  The patient has a gait disturbance, uses a walker and seems to do well when he remembers to do this.  He has not had any falls since last seen.  He resides in an assisted living situation.  He is on Seroquel taking one half of a 25 mg tablet around 5 PM and 25 mg in the evening for hallucinations, this remains a problem.  The patient is not sleeping well at night because of hallucinations but he also has developed some problems with delusional thinking but seem to have worsened on the Seroquel.  He also reports some urinary urgency and problems with constipation.  He has Mirapex on an as-needed basis but does not get this regularly.  The patient has had some gradual decrease in mobility as well.  He does get some physical and occupational therapy ongoing.  He returns for further evaluation.  Past Medical History:  Diagnosis Date  . Abdominal pain, right lower quadrant   . Abnormality of gait 05/16/2016  . Acute prostatitis   . Anxiety   . Constipation   . Degenerative disc disease   . Depression   . Diverticulosis of colon (without mention of hemorrhage)   . Essential and other specified forms of tremor   . Family history of malignant neoplasm of gastrointestinal tract   . Fibromyalgia   . Ganglion and cyst of synovium, tendon, and bursa   . GERD (gastroesophageal reflux disease)   . Hemarthrosis, upper arm   . Hyperlipidemia   . Hypersomnia with sleep apnea, unspecified   . Hypertension   . Hypopotassemia   . Localized osteoarthrosis not specified whether primary or secondary, lower leg   . Lumbago   . Memory difficulties 04/22/2013  . Nocturia   . Osteoporosis, unspecified   . Other testicular hypofunction   . Parkinson disease ([place]) 05/16/2016  .  Right inguinal hernia   . Sleep apnea    last sleep study 11/11 on chart- Bipap with settings of 4 per  patient  . Spinal stenosis, lumbar region, without neurogenic claudication   . Syncope and collapse   . Unspecified adverse effect of unspecified drug, medicinal and biological substance   . Unspecified gastritis and gastroduodenitis without mention of hemorrhage   . Urinary frequency     Past Surgical History:  Procedure Laterality Date  . BACK SURGERY     x 3  . [place]  . INGUINAL HERNIA REPAIR  07/19/2011   Procedure: LAPAROSCOPIC INGUINAL HERNIA;  Surgeon: Odis Hollingshead, MD;  Location: [facility];  Service: General;  Laterality: Right;  Laparoscopic Repair of Recurrent Right  Ingunial Hernia with Mesh  . microdisectomy  03/02/2003, 06/29/2003, 12/24/2005  . TONSILLECTOMY      Family History  Problem Relation Age of Onset  . Colon cancer Mother   . Dementia Mother   . Parkinsonism Father 44  . Lung cancer Paternal Grandfather        smoker  . Liver disease Neg Hx   . Kidney disease Neg Hx   . Esophageal cancer Neg Hx     Social history:  reports that he has never smoked. He has never used smokeless tobacco. He reports that he does not drink alcohol and does not  use drugs.    Allergies  Allergen Reactions  . Nucynta [Tapentadol] Other (See Comments)    dizziness  . Pregabalin     REACTION: dizziness and mental status changes  . Exelon [Rivastigmine]     abd pain    Medications:  Prior to Admission medications   Medication Sig Start Date End Date Taking? Authorizing Provider  amLODipine (NORVASC) 10 MG tablet Take 1 tablet (10 mg total) by mouth daily. 08/10/19   Burchette, Alinda Sierras, MD  Calcium Carbonate-Vitamin D (CALCIUM 600+D) 600-400 MG-UNIT tablet Take 1 tablet by mouth daily. 04/10/18   Burchette, Alinda Sierras, MD  carbidopa-levodopa (SINEMET IR) 25-100 MG tablet TAKE 1 & 1/2 TABLETS BY MOUTH EACH MORNING. TAKE (1) TABLET BY MOUTH MIDDAY AND IN  THE EVENING. 10/14/18   Kathrynn Ducking, MD  Cholecalciferol (VITAMIN D3) 125 MCG (5000 UT) CAPS Take 1 capsule by mouth daily. 08/23/19   [provider]  citalopram (CELEXA) 20 MG tablet Take 1 tablet (20 mg total) by mouth daily. 02/03/18   Burchette, Alinda Sierras, MD  diclofenac Sodium (VOLTAREN) 1 % GEL Apply 2 g topically as needed (lateral hip pain; for bursitis).    [provider]  entacapone (COMTAN) 200 MG tablet TAKE (1) TABLET BY MOUTH (3) TIMES DAILY. 11/13/18   Kathrynn Ducking, MD  famotidine (PEPCID) 20 MG tablet Take 1 tablet (20 mg total) by mouth 2 (two) times daily. 12/14/18   Burchette, Alinda Sierras, MD  KLOR-CON M20 20 MEQ tablet TAKE 3 TABLETS (60 MEQ TOTAL) BY MOUTH DAILY. 01/02/18   Burchette, Alinda Sierras, MD  Krill Oil 1000 MG CAPS Take 1 capsule by mouth 2 (two) times daily.    [provider]  losartan (COZAAR) 100 MG tablet TAKE 1 TABLET (100 MG TOTAL) BY MOUTH DAILY. 09/16/17   Burchette, Alinda Sierras, MD  MAGNESIUM CITRATE PO Take 250 mg by mouth 2 (two) times daily.     [provider]  memantine (NAMENDA) 10 MG tablet TAKE 1 TABLET (10 MG TOTAL) BY MOUTH 2 (TWO) TIMES DAILY. 12/08/17   Kathrynn Ducking, MD  polyethylene glycol (MIRALAX / GLYCOLAX) 17 g packet Take 17 g by mouth daily as needed.    [provider]  Probiotic Product (ALIGN) 4 MG CAPS Take by mouth daily.     [provider]  QUEtiapine (SEROQUEL) 25 MG tablet 1/2 tablet at 5 pm and 1 tablet at bedtime 12/14/19   Kathrynn Ducking, MD  vitamin B-12 (CYANOCOBALAMIN) 50 MCG tablet Take 50 mcg by mouth daily.     [provider]    ROS:  Out of a complete 14 system review of symptoms, the patient complains only of the following symptoms, and all other reviewed systems are negative.  Memory problems Hallucination, delusional thinking Walking difficulty  Blood pressure (!) 148/71, pulse 62, height 5\' 8"  (1.727 m), weight 149 lb (67.6 kg).  Physical  Exam  General: The patient is alert and cooperative at the time of the examination.  Skin: No significant peripheral edema is noted.   Neurologic Exam  Mental status: The patient is alert and oriented x 3 at the time of the examination. The patient is able to follow commands fairly well.   Cranial nerves: Facial symmetry is present. Speech is normal, no aphasia or dysarthria is noted. Extraocular movements are full. Visual fields are full.  Masking the face is seen.  Motor: The patient has good strength in all 4 extremities.  Sensory examination: Soft touch sensation is symmetric on the face, arms, and legs.  Coordination: The patient has good finger-nose-finger and heel-to-shin bilaterally.  Gait and station: The patient is able to stand by pushing off on the chair.  The use a set of rolling walker with good stride and fairly good turns.  Tandem gait was not attempted.  Romberg is negative, no drift is seen.  Reflexes: Deep tendon reflexes are symmetric.   Assessment/Plan:  1.  Parkinson's disease  2.  Dementia  3.  Gait disturbance  The patient will continue the Sinemet and Comtan at the current dose.  He is having increasing problems with delusional thinking, the Seroquel will be reduced to 25 mg at night, we will add Nuplazid 34 mg daily.  After 1 month, if he is doing well with the Nuplazid, we will eliminate the Seroquel completely.  The patient will take MiraLAX on a daily basis for the constipation.  We will follow up here in 4 months.  Jill Alexanders MD 05/16/2020 9:15 AM  [facility] [address] [place],  [address]  Phone [phone] Fax [phone]

## 2020-05-16 NOTE — Patient Instructions (Signed)
We will reduce the Seroquel to 25 mg at night. Take the miralax 17 gm daily.  We will start Nuplazid daily, after one month, if you are doing well, we will stop the seroquel.

## 2020-05-17 ENCOUNTER — Telehealth: Payer: Self-pay | Admitting: Neurology

## 2020-05-17 ENCOUNTER — Telehealth: Payer: Self-pay | Admitting: *Deleted

## 2020-05-17 DIAGNOSIS — M6281 Muscle weakness (generalized): Secondary | ICD-10-CM | POA: Diagnosis not present

## 2020-05-17 DIAGNOSIS — R2681 Unsteadiness on feet: Secondary | ICD-10-CM | POA: Diagnosis not present

## 2020-05-17 MED ORDER — QUETIAPINE FUMARATE 25 MG PO TABS: ORAL_TABLET | ORAL | 2 refills | Status: DC

## 2020-05-17 NOTE — Telephone Encounter (Signed)
Called patient's POA, [name] (Son) on [place]. Advised copay for Nuplazid through regular pharmacy 1000.00. We need a signed treatment form to send to nuplazid connect. They can then enroll his father in the hub and help with financial assistance if needed. Son will bring copy of insurance cards tomorrow morning when he comes to sign form. I placed up front for him. He is aware.

## 2020-05-17 NOTE — Telephone Encounter (Signed)
Dr Willis- please advise 

## 2020-05-17 NOTE — Telephone Encounter (Signed)
[facility] [place]) called, son said Dr. Jannifer Franklin wanted to stop 5 pm dose for QUEtiapine (SEROQUEL) 25 MG tablet. Would like a call back from the nurse to clarify. Please call back as soon as possible.

## 2020-05-17 NOTE — Telephone Encounter (Signed)
I called and spoke with [name], whenever the Nuplazid is started, then they are to cut back on the Seroquel dose to giving 25 mg at night only, and drop the 5 PM dose.  I will write a order for this and fax it to them at [facility] number is [phone].   It is not clear whether or not the patient will actually be able to get on Nuplazid as it is very expensive, unless he can get financial assistance it would not be possible to use the drug.

## 2020-05-18 ENCOUNTER — Telehealth: Payer: Self-pay | Admitting: Neurology

## 2020-05-18 NOTE — Telephone Encounter (Signed)
Physician form faxed back to [facility] 05/18/2020.

## 2020-05-19 DIAGNOSIS — M6281 Muscle weakness (generalized): Secondary | ICD-10-CM | POA: Diagnosis not present

## 2020-05-19 DIAGNOSIS — R2681 Unsteadiness on feet: Secondary | ICD-10-CM | POA: Diagnosis not present

## 2020-05-23 DIAGNOSIS — R2681 Unsteadiness on feet: Secondary | ICD-10-CM | POA: Diagnosis not present

## 2020-05-23 DIAGNOSIS — M6281 Muscle weakness (generalized): Secondary | ICD-10-CM | POA: Diagnosis not present

## 2020-05-24 DIAGNOSIS — M6281 Muscle weakness (generalized): Secondary | ICD-10-CM | POA: Diagnosis not present

## 2020-05-24 DIAGNOSIS — R2681 Unsteadiness on feet: Secondary | ICD-10-CM | POA: Diagnosis not present

## 2020-05-26 DIAGNOSIS — M6281 Muscle weakness (generalized): Secondary | ICD-10-CM | POA: Diagnosis not present

## 2020-05-26 DIAGNOSIS — R2681 Unsteadiness on feet: Secondary | ICD-10-CM | POA: Diagnosis not present

## 2020-05-30 DIAGNOSIS — R2681 Unsteadiness on feet: Secondary | ICD-10-CM | POA: Diagnosis not present

## 2020-05-30 DIAGNOSIS — M6281 Muscle weakness (generalized): Secondary | ICD-10-CM | POA: Diagnosis not present

## 2020-05-31 DIAGNOSIS — R2681 Unsteadiness on feet: Secondary | ICD-10-CM | POA: Diagnosis not present

## 2020-05-31 DIAGNOSIS — M6281 Muscle weakness (generalized): Secondary | ICD-10-CM | POA: Diagnosis not present

## 2020-06-01 DIAGNOSIS — M6281 Muscle weakness (generalized): Secondary | ICD-10-CM | POA: Diagnosis not present

## 2020-06-01 DIAGNOSIS — R2681 Unsteadiness on feet: Secondary | ICD-10-CM | POA: Diagnosis not present

## 2020-06-02 DIAGNOSIS — R2681 Unsteadiness on feet: Secondary | ICD-10-CM | POA: Diagnosis not present

## 2020-06-02 DIAGNOSIS — M6281 Muscle weakness (generalized): Secondary | ICD-10-CM | POA: Diagnosis not present

## 2020-06-07 ENCOUNTER — Telehealth: Payer: Self-pay | Admitting: Family Medicine

## 2020-06-07 NOTE — Telephone Encounter (Signed)
I would suggest medicare wellness.   Do not think he needs both.  He is fairly debilitated with his Parkinson's and may need some assistance even with virtual [place].

## 2020-06-07 NOTE — Telephone Encounter (Signed)
Doesn't he need both? CPE with you and AWV with wellness nurse?

## 2020-06-07 NOTE — Telephone Encounter (Signed)
Do I need to schedule a CPE or an AWV for this patient since he hasn't had one since 2019.

## 2020-06-07 NOTE — Telephone Encounter (Signed)
Pt's son, [name] (on [place]) called, got a call from assistance living yesterday; Nuplazid rep called, but did not leave any information. Nuplazid was prescribed September 28 and still do not have the medication. Pharmacy said they can not fill the prescription without hearing from [place]. Need Nuplazid rep information. Would like a call from the nurse.

## 2020-06-08 NOTE — Telephone Encounter (Signed)
Called son [name] back and informed him that we have faxed the information back to [employer].  Asked for [place] to follow up with him if we hear anything back from the assistance program.  I also asked him to let us know if he hears from the assistance program as well.

## 2020-06-08 NOTE — Telephone Encounter (Signed)
[name], can you follow up on this? This is a Dr. Jannifer Franklin pt. Looks like there is a phone note in from [place] that she faxed a form to facility, may want to follow up with her

## 2020-06-09 NOTE — Telephone Encounter (Signed)
AWV only

## 2020-06-09 NOTE — Telephone Encounter (Signed)
[facility]  Adrian Prince) called, treatment form is missing some information, please call me. Can contact me at [phone]

## 2020-06-12 NOTE — Telephone Encounter (Signed)
Called [name] and assured him that Dr. Jannifer Franklin has the form and will complete in the next day or two.  Will fax at the number at top of form when finished.

## 2020-06-14 NOTE — Telephone Encounter (Signed)
Called and and spoke to son [name] (on [place]) that form had been refaxed to Nuplaz[employer] as well as message left for Adrian Prince with [employer].  Son expressed appreciation.

## 2020-06-14 NOTE — Telephone Encounter (Signed)
Called and left message for [facility], rep for Nuplazid that form has been refaxed.  Asked for return call to verify receipt of form.

## 2020-06-15 NOTE — Telephone Encounter (Signed)
Left message at [facility] the resident home that patient lives in requesting that they give me a call back in regards to getting patient scheduled

## 2020-06-15 NOTE — Telephone Encounter (Signed)
Spoke with [facility] and was able to get patient scheduled for next Thursday 06/22/2020 @ 2PM

## 2020-06-19 ENCOUNTER — Telehealth: Payer: Self-pay | Admitting: Neurology

## 2020-06-19 NOTE — Telephone Encounter (Signed)
Patient Access Manager, [facility], resend the treatment for Nuplazid. The form came over very blurry. When you resend the form verify in the section "please confirm diagnosis box to be checked.

## 2020-06-19 NOTE — Telephone Encounter (Signed)
Nuplazid form refaxed.

## 2020-06-21 NOTE — Telephone Encounter (Signed)
Form re-faxed

## 2020-06-21 NOTE — Telephone Encounter (Signed)
[name] from [facility] has called to request Pimavanserin Tartrate (NUPLAZID) 34 MG CAPS form be sent again. He states section 3 of form check box is incomplete & needs to be checked & resent.  Best contact number: [phone]

## 2020-06-22 ENCOUNTER — Other Ambulatory Visit: Payer: Self-pay

## 2020-06-22 ENCOUNTER — Ambulatory Visit (INDEPENDENT_AMBULATORY_CARE_PROVIDER_SITE_OTHER): Payer: PPO

## 2020-06-22 VITALS — BP 124/78 | HR 56 | Temp 98.1°F | Ht 68.0 in | Wt 143.2 lb

## 2020-06-22 DIAGNOSIS — G20 Parkinson's disease: Secondary | ICD-10-CM

## 2020-06-22 DIAGNOSIS — Z0000 Encounter for general adult medical examination without abnormal findings: Secondary | ICD-10-CM | POA: Diagnosis not present

## 2020-06-22 DIAGNOSIS — I10 Essential (primary) hypertension: Secondary | ICD-10-CM

## 2020-06-22 DIAGNOSIS — Z1211 Encounter for screening for malignant neoplasm of colon: Secondary | ICD-10-CM | POA: Diagnosis not present

## 2020-06-22 NOTE — Patient Instructions (Signed)
[name] , Thank you for taking time to come for your Medicare Wellness Visit. I appreciate your ongoing commitment to your health goals. Please review the following plan we discussed and let me know if I can assist you in the future.   Screening recommendations/referrals: Colonoscopy: Currently due, orders placed this visit  Recommended yearly ophthalmology/optometry visit for glaucoma screening and checkup Recommended yearly dental visit for hygiene and checkup  Vaccinations: Influenza vaccine: Up to date, next due fall 2022  Pneumococcal vaccine: Completed series Tdap vaccine: Up to date, next due 03/07/2024 Shingles vaccine: Currently due for Shingrix, if you wish to receive we recommend getting it at the pharmacy as it will be cheaper.    Advanced directives: Please bring in copies of your advanced medical directives so that we may scan them into your chart.   Conditions/risks identified: None   Next appointment: None   Preventive Care 65 Years and Older, Male Preventive care refers to lifestyle choices and visits with your health care provider that can promote health and wellness. What does preventive care include?  A yearly physical exam. This is also called an annual well check.  Dental exams once or twice a year.  Routine eye exams. Ask your health care provider how often you should have your eyes checked.  Personal lifestyle choices, including:  Daily care of your teeth and gums.  Regular physical activity.  Eating a healthy diet.  Avoiding tobacco and drug use.  Limiting alcohol use.  Practicing safe sex.  Taking low doses of aspirin every day.  Taking vitamin and mineral supplements as recommended by your health care provider. What happens during an annual well check? The services and screenings done by your health care provider during your annual well check will depend on your age, overall health, lifestyle risk factors, and family history of  disease. Counseling  Your health care provider may ask you questions about your:  Alcohol use.  Tobacco use.  Drug use.  Emotional well-being.  Home and relationship well-being.  Sexual activity.  Eating habits.  History of falls.  Memory and ability to understand (cognition).  Work and work Statistician. Screening  You may have the following tests or measurements:  Height, weight, and BMI.  Blood pressure.  Lipid and cholesterol levels. These may be checked every 5 years, or more frequently if you are over 42 years old.  Skin check.  Lung cancer screening. You may have this screening every year starting at age 42 if you have a 30-pack-year history of smoking and currently smoke or have quit within the past 15 years.  Fecal occult blood test (FOBT) of the stool. You may have this test every year starting at age 105.  Flexible sigmoidoscopy or colonoscopy. You may have a sigmoidoscopy every 5 years or a colonoscopy every 10 years starting at age 65.  Prostate cancer screening. Recommendations will vary depending on your family history and other risks.  Hepatitis C blood test.  Hepatitis B blood test.  Sexually transmitted disease (STD) testing.  Diabetes screening. This is done by checking your blood sugar (glucose) after you have not eaten for a while (fasting). You may have this done every 1-3 years.  Abdominal aortic aneurysm (AAA) screening. You may need this if you are a current or former smoker.  Osteoporosis. You may be screened starting at age 108 if you are at high risk. Talk with your health care provider about your test results, treatment options, and if necessary, the need  for more tests. Vaccines  Your health care provider may recommend certain vaccines, such as:  Influenza vaccine. This is recommended every year.  Tetanus, diphtheria, and acellular pertussis (Tdap, Td) vaccine. You may need a Td booster every 10 years.  Zoster vaccine. You may  need this after age 56.  Pneumococcal 13-valent conjugate (PCV13) vaccine. One dose is recommended after age 61.  Pneumococcal polysaccharide (PPSV23) vaccine. One dose is recommended after age 67. Talk to your health care provider about which screenings and vaccines you need and how often you need them. This information is not intended to replace advice given to you by your health care provider. Make sure you discuss any questions you have with your health care provider. Document Released: 09/01/2015 Document Revised: 04/24/2016 Document Reviewed: 06/06/2015 Elsevier Interactive Patient Education  2017 Media Prevention in the Home Falls can cause injuries. They can happen to people of all ages. There are many things you can do to make your home safe and to help prevent falls. What can I do on the outside of my home?  Regularly fix the edges of walkways and driveways and fix any cracks.  Remove anything that might make you trip as you walk through a door, such as a raised step or threshold.  Trim any bushes or trees on the path to your home.  Use bright outdoor lighting.  Clear any walking paths of anything that might make someone trip, such as rocks or tools.  Regularly check to see if handrails are loose or broken. Make sure that both sides of any steps have handrails.  Any raised decks and porches should have guardrails on the edges.  Have any leaves, snow, or ice cleared regularly.  Use sand or salt on walking paths during winter.  Clean up any spills in your garage right away. This includes oil or grease spills. What can I do in the bathroom?  Use night lights.  Install grab bars by the toilet and in the tub and shower. Do not use towel bars as grab bars.  Use non-skid mats or decals in the tub or shower.  If you need to sit down in the shower, use a plastic, non-slip stool.  Keep the floor dry. Clean up any water that spills on the floor as soon as it  happens.  Remove soap buildup in the tub or shower regularly.  Attach bath mats securely with double-sided non-slip rug tape.  Do not have throw rugs and other things on the floor that can make you trip. What can I do in the bedroom?  Use night lights.  Make sure that you have a light by your bed that is easy to reach.  Do not use any sheets or blankets that are too big for your bed. They should not hang down onto the floor.  Have a firm chair that has side arms. You can use this for support while you get dressed.  Do not have throw rugs and other things on the floor that can make you trip. What can I do in the kitchen?  Clean up any spills right away.  Avoid walking on wet floors.  Keep items that you use a lot in easy-to-reach places.  If you need to reach something above you, use a strong step stool that has a grab bar.  Keep electrical cords out of the way.  Do not use floor polish or wax that makes floors slippery. If you must use wax, use  non-skid floor wax.  Do not have throw rugs and other things on the floor that can make you trip. What can I do with my stairs?  Do not leave any items on the stairs.  Make sure that there are handrails on both sides of the stairs and use them. Fix handrails that are broken or loose. Make sure that handrails are as long as the stairways.  Check any carpeting to make sure that it is firmly attached to the stairs. Fix any carpet that is loose or worn.  Avoid having throw rugs at the top or bottom of the stairs. If you do have throw rugs, attach them to the floor with carpet tape.  Make sure that you have a light switch at the top of the stairs and the bottom of the stairs. If you do not have them, ask someone to add them for you. What else can I do to help prevent falls?  Wear shoes that:  Do not have high heels.  Have rubber bottoms.  Are comfortable and fit you well.  Are closed at the toe. Do not wear sandals.  If you  use a stepladder:  Make sure that it is fully opened. Do not climb a closed stepladder.  Make sure that both sides of the stepladder are locked into place.  Ask someone to hold it for you, if possible.  Clearly mark and make sure that you can see:  Any grab bars or handrails.  First and last steps.  Where the edge of each step is.  Use tools that help you move around (mobility aids) if they are needed. These include:  Canes.  Walkers.  Scooters.  Crutches.  Turn on the lights when you go into a dark area. Replace any light bulbs as soon as they burn out.  Set up your furniture so you have a clear path. Avoid moving your furniture around.  If any of your floors are uneven, fix them.  If there are any pets around you, be aware of where they are.  Review your medicines with your doctor. Some medicines can make you feel dizzy. This can increase your chance of falling. Ask your doctor what other things that you can do to help prevent falls. This information is not intended to replace advice given to you by your health care provider. Make sure you discuss any questions you have with your health care provider. Document Released: 06/01/2009 Document Revised: 01/11/2016 Document Reviewed: 09/09/2014 Elsevier Interactive Patient Education  2017 [employer].

## 2020-06-22 NOTE — Progress Notes (Signed)
Subjective:   [name] Haskin. is a 74 y.o. male who presents for an Initial Medicare Annual Wellness Visit.  Review of Systems    N/A  Cardiac Risk Factors include: advanced age (>65men, >62 women);hypertension;dyslipidemia     Objective:    Today's Vitals   06/22/20 1423 06/22/20 1425  BP: 124/78   Pulse: (!) 56   Temp: 98.1 F (36.7 C)   TempSrc: Oral   SpO2: 91%   Weight: 143 lb 3 oz (64.9 kg)   Height: 5\' 8"  (1.727 m)   PainSc:  6    Body mass index is 21.77 kg/m.  Advanced Directives 06/22/2020 03/31/2018 03/28/2017 08/27/2016 05/11/2015 11/03/2014 07/16/2011  Does Patient Have a Medical Advance Directive? Yes Yes Yes Yes Yes Yes Patient has advance directive, copy not in chart  Type of Advance Directive [place];Living will - - [place];Living will [place];Living will [place];Living will -  Does patient want to make changes to medical advance directive? No - Patient declined - - - - - -  Copy of [place] in Chart? No - copy requested - - Yes No - copy requested - -    Current Medications (verified) Outpatient Encounter Medications as of 06/22/2020  Medication Sig  . amLODipine (NORVASC) 10 MG tablet Take 1 tablet (10 mg total) by mouth daily.  . Calcium Carbonate-Vitamin D (CALCIUM 600+D) 600-400 MG-UNIT tablet Take 1 tablet by mouth daily.  . carbidopa-levodopa (SINEMET IR) 25-100 MG tablet TAKE 1 & 1/2 TABLETS BY MOUTH EACH MORNING. TAKE (1) TABLET BY MOUTH MIDDAY AND IN THE EVENING.  Marland Kitchen Cholecalciferol (VITAMIN D3) 125 MCG (5000 UT) CAPS Take 1 capsule by mouth daily.  . citalopram (CELEXA) 20 MG tablet Take 1 tablet (20 mg total) by mouth daily.  . diclofenac Sodium (VOLTAREN) 1 % GEL Apply 2 g topically as needed (lateral hip pain; for bursitis).  . entacapone (COMTAN) 200 MG tablet TAKE (1) TABLET BY MOUTH (3) TIMES DAILY.  . famotidine (PEPCID) 20 MG tablet Take 1  tablet (20 mg total) by mouth 2 (two) times daily.  Marland Kitchen KLOR-CON M20 20 MEQ tablet TAKE 3 TABLETS (60 MEQ TOTAL) BY MOUTH DAILY.  Marland Kitchen Krill Oil 1000 MG CAPS Take 1 capsule by mouth 2 (two) times daily.  Marland Kitchen losartan (COZAAR) 100 MG tablet TAKE 1 TABLET (100 MG TOTAL) BY MOUTH DAILY.  Marland Kitchen MAGNESIUM CITRATE PO Take 250 mg by mouth 2 (two) times daily.   . memantine (NAMENDA) 10 MG tablet TAKE 1 TABLET (10 MG TOTAL) BY MOUTH 2 (TWO) TIMES DAILY.  Marland Kitchen polyethylene glycol (MIRALAX / GLYCOLAX) 17 g packet Take 17 g by mouth daily.  . Probiotic Product (ALIGN) 4 MG CAPS Take by mouth daily.   . QUEtiapine (SEROQUEL) 25 MG tablet 1 tablet at bedtime  . vitamin B-12 (CYANOCOBALAMIN) 50 MCG tablet Take 50 mcg by mouth daily.   Debby Freiberg Tartrate (NUPLAZID) 34 MG CAPS Take 1 capsule (34 mg total) by mouth daily. (Patient not taking: Reported on 06/22/2020)   No facility-administered encounter medications on file as of 06/22/2020.    Allergies (verified) Nucynta [tapentadol], Pregabalin, and Exelon [rivastigmine]   History: Past Medical History:  Diagnosis Date  . Abdominal pain, right lower quadrant   . Abnormality of gait 05/16/2016  . Acute prostatitis   . Anxiety   . Constipation   . Degenerative disc disease   . Depression   .  Diverticulosis of colon (without mention of hemorrhage)   . Essential and other specified forms of tremor   . Family history of malignant neoplasm of gastrointestinal tract   . Fibromyalgia   . Ganglion and cyst of synovium, tendon, and bursa   . GERD (gastroesophageal reflux disease)   . Hemarthrosis, upper arm   . Hyperlipidemia   . Hypersomnia with sleep apnea, unspecified   . Hypertension   . Hypopotassemia   . Localized osteoarthrosis not specified whether primary or secondary, lower leg   . Lumbago   . Memory difficulties 04/22/2013  . Nocturia   . Osteoporosis, unspecified   . Other testicular hypofunction   . Parkinson disease ([place]) 05/16/2016  . Right  inguinal hernia   . Sleep apnea    last sleep study 11/11 on chart- Bipap with settings of 4 per  patient  . Spinal stenosis, lumbar region, without neurogenic claudication   . Syncope and collapse   . Unspecified adverse effect of unspecified drug, medicinal and biological substance   . Unspecified gastritis and gastroduodenitis without mention of hemorrhage   . Urinary frequency    Past Surgical History:  Procedure Laterality Date  . BACK SURGERY     x 3  . [place]  . INGUINAL HERNIA REPAIR  07/19/2011   Procedure: LAPAROSCOPIC INGUINAL HERNIA;  Surgeon: Odis Hollingshead, MD;  Location: [facility];  Service: General;  Laterality: Right;  Laparoscopic Repair of Recurrent Right  Ingunial Hernia with Mesh  . microdisectomy  03/02/2003, 06/29/2003, 12/24/2005  . TONSILLECTOMY     Family History  Problem Relation Age of Onset  . Colon cancer Mother   . Dementia Mother   . Parkinsonism Father 25  . Lung cancer Paternal Grandfather        smoker  . Liver disease Neg Hx   . Kidney disease Neg Hx   . Esophageal cancer Neg Hx    Social History   Socioeconomic History  . Marital status: Widowed    Spouse name: Not on file  . Number of children: 2  . Years of education: college  . Highest education level: Not on file  Occupational History  . Occupation: retired    Fish farm manager: RETIRED  Tobacco Use  . Smoking status: Never Smoker  . Smokeless tobacco: Never Used  Vaping Use  . Vaping Use: Never used  Substance and Sexual Activity  . Alcohol use: No    Alcohol/week: 0.0 standard drinks    Comment: less than 5 per week  . Drug use: No  . Sexual activity: Not on file  Other Topics Concern  . Not on file  Social History Narrative   Lives at home   Patient is right handed.   Patient drinks 1 cup of caffeine per day.   Social Determinants of Health   Financial Resource Strain: Low Risk   . Difficulty of Paying Living Expenses: Not hard at all  Food Insecurity:  No Food Insecurity  . Worried About Charity fundraiser in the Last Year: Never true  . Ran Out of Food in the Last Year: Never true  Transportation Needs: No Transportation Needs  . Lack of Transportation (Medical): No  . Lack of Transportation (Non-Medical): No  Physical Activity: Inactive  . Days of Exercise per Week: 0 days  . Minutes of Exercise per Session: 0 min  Stress: No Stress Concern Present  . Feeling of Stress : Only a little  Social Connections: Moderately  Isolated  . Frequency of Communication with Friends and Family: More than three times a week  . Frequency of Social Gatherings with Friends and Family: Once a week  . Attends Religious Services: More than 4 times per year  . Active Member of Clubs or Organizations: No  . Attends Archivist Meetings: Never  . Marital Status: Widowed    Tobacco Counseling Counseling given: Not Answered   Clinical Intake:  Pre-visit preparation completed: Yes  Pain : 0-10 Pain Score: 6  Pain Type: Chronic pain Pain Location: Back Pain Orientation: Lower, Mid, Upper Pain Descriptors / Indicators: Aching, Sharp (Steady) Pain Onset: More than a month ago Pain Frequency: Constant     Nutritional Risks: Nausea/ vomitting/ diarrhea (diarrhea) Diabetes: No  How often do you need to have someone help you when you read instructions, pamphlets, or other written materials from your doctor or pharmacy?: 2 - Rarely What is the last grade level you completed in school?: 14 years of school  Diabetic?No  Interpreter Needed?: No  Information entered by :: [place] of Daily Living In your present state of health, do you have any difficulty performing the following activities: 06/22/2020  Hearing? N  Vision? N  Difficulty concentrating or making decisions? Y  Walking or climbing stairs? Y  Comment leg weakness  Dressing or bathing? N  Doing errands, shopping? Y  Preparing Food and eating ? Y  Using the  Toilet? N  In the past six months, have you accidently leaked urine? Y  Do you have problems with loss of bowel control? N  Managing your Medications? N  Managing your Finances? N  Housekeeping or managing your Housekeeping? Y  Some recent data might be hidden    Patient Care Team: Eulas Post, MD as PCP - General (Family Medicine) Lorretta Harp, MD as PCP - Cardiology (Cardiology)  Indicate any recent Medical Services you may have received from other than [facility] providers in the past year (date may be approximate).     Assessment:   This is a routine wellness examination for [name].  Hearing/Vision screen  Hearing Screening   125Hz  250Hz  500Hz  1000Hz  2000Hz  3000Hz  4000Hz  6000Hz  8000Hz   Right ear:           Left ear:           Vision Screening Comments: Patient states gets eyes checked annually   Dietary issues and exercise activities discussed: Current Exercise Habits: Structured exercise class, Type of exercise: strength training/weights;walking, Time (Minutes): 30, Frequency (Times/Week): 3, Weekly Exercise (Minutes/Week): 90, Intensity: Mild  Goals    . patient     To stay independent / continue at home    . Patient Stated     To maintain your health with exercise     . Patient Stated     I would like to get my incontinence under control and work on my balance.       Depression Screen PHQ 2/9 Scores 06/22/2020 03/31/2018 03/28/2017 05/14/2016 03/28/2015 03/07/2014 04/14/2012  PHQ - 2 Score 0 0 0 0 0 1 2  PHQ- 9 Score 0 - - - - - -    Fall Risk Fall Risk  06/22/2020 05/14/2018 03/31/2018 03/31/2018 03/28/2017  Falls in the past year? 1 Yes Yes Yes No  Comment - - - fell in march and broke a couple ribs  -  Number falls in past yr: 1 2 or more - 2 or more -  Comment - - -  mistep due to frozen; start suffling backwards. steps and hands were full; rollator -  Injury with Fall? 0 No - Yes -  Risk for fall due to : Impaired balance/gait;Medication side effect - -  History of fall(s);Impaired balance/gait -  Follow up Falls evaluation completed;Falls prevention discussed - - - -    Any stairs in or around the home? No  If so, are there any without handrails? No  Home Speigner of loose throw rugs in walkways, pet beds, electrical cords, etc? Yes  Adequate lighting in your home to reduce risk of falls? Yes   ASSISTIVE DEVICES UTILIZED TO PREVENT FALLS:  Life alert? Yes  Use of a cane, walker or w/c? No  Grab bars in the bathroom? Yes  Shower chair or bench in shower? Yes  Elevated toilet seat or a handicapped toilet? Yes   TIMED UP AND GO:  Was the test performed? Yes .  Length of time to ambulate 10 feet: 11 sec.   Gait slow and steady without use of assistive device  Cognitive Function: MMSE - Mini Mental State Exam 12/14/2019 09/15/2018 05/14/2018 03/31/2018 01/08/2018  Not completed: - - - (No Data) -  Orientation to time 3 4 3  - 4  Orientation to Place 5 5 5  - 5  Registration 3 3 3  - 3  Attention/ Calculation 1 2 3  - 2  Recall 3 2 2  - 2  Language- name 2 objects 2 2 2  - 2  Language- repeat 1 1 1  - 1  Language- follow 3 step command 3 3 3  - 3  Language- read & follow direction 1 1 1  - 1  Write a sentence 1 1 1  - 1  Copy design 0 0 0 - 1  Total score 23 24 24  - 25     6CIT Screen 06/22/2020  What Year? 0 points  What month? 3 points  What time? 0 points  Count back from 20 4 points  Months in reverse 4 points  Repeat phrase 10 points  Total Score 21    Immunizations Immunization History  Administered Date(s) Administered  . Influenza Whole 08/19/2005, 05/09/2008, 05/16/2009, 05/21/2010  . Influenza, High Dose Seasonal PF 05/14/2016, 04/25/2017  . Influenza,inj,Quad PF,6+ Mos 05/07/2013, 06/14/2014  . Influenza-Unspecified 06/02/2015, 05/26/2020  . PPD Test 12/25/2017  . Pneumococcal Conjugate-13 03/28/2017  . Pneumococcal Polysaccharide-23 03/07/2014  . Td 08/19/2001  . Tdap 03/07/2014  . Zoster 09/24/2013    TDAP  status: Up to date Flu Vaccine status: Up to date Pneumococcal vaccine status: Up to date Covid-19 vaccine status: Completed vaccines  Qualifies for Shingles Vaccine? Yes   Zostavax completed Yes   Shingrix Completed?: No.    Education has been provided regarding the importance of this vaccine. Patient has been advised to call insurance company to determine out of pocket expense if they have not yet received this vaccine. Advised may also receive vaccine at local pharmacy or Health Dept. Verbalized acceptance and understanding.  Screening Tests Health Maintenance  Topic Date Due  . COVID-19 Vaccine (1) Never done  . COLONOSCOPY  02/03/2016  . TETANUS/TDAP  03/07/2024  . INFLUENZA VACCINE  Completed  . Hepatitis C Screening  Completed  . PNA vac Low Risk Adult  Completed    Health Maintenance  Health Maintenance Due  Topic Date Due  . COVID-19 Vaccine (1) Never done  . COLONOSCOPY  02/03/2016    Colorectal cancer screening: Referral to GI placed 06/22/2020. Pt aware the office will call  re: appt.  Lung Cancer Screening: (Low Dose CT Chest recommended if Age 72-80 years, 30 pack-year currently smoking OR have quit w/in 15years.) does not qualify.   Lung Cancer Screening Referral: N/A  Additional Screening:  Hepatitis C Screening: does qualify; Completed 09/11/2017  Vision Screening: Recommended annual ophthalmology exams for early detection of glaucoma and other disorders of the eye. Is the patient up to date with their annual eye exam?  Yes  Who is the provider or what is the name of the office in which the patient attends annual eye exams? [facility], Dr. Laban Emperor If pt is not established with a provider, would they like to be referred to a provider to establish care? No .   Dental Screening: Recommended annual dental exams for proper oral hygiene  Community Resource Referral / Chronic Care Management: CRR required this visit?  No   CCM required this visit?  Yes       Plan:     I have personally reviewed and noted the following in the patient's chart:   . Medical and social history . Use of alcohol, tobacco or illicit drugs  . Current medications and supplements . Functional ability and status . Nutritional status . Physical activity . Advanced directives . List of other physicians . Hospitalizations, surgeries, and ER visits in previous 12 months . Vitals . Screenings to include cognitive, depression, and falls . Referrals and appointments  In addition, I have reviewed and discussed with patient certain preventive protocols, quality metrics, and best practice recommendations. A written personalized care plan for preventive services as well as general preventive health recommendations were provided to patient.     Ofilia Neas, LPN   09/21/3433   Nurse Notes: None

## 2020-07-04 ENCOUNTER — Telehealth: Payer: Self-pay | Admitting: Family Medicine

## 2020-07-04 NOTE — Telephone Encounter (Signed)
Spoke with [name] and asked her to resend form.  She was agreeable.  Will process as soon as possible.

## 2020-07-04 NOTE — Telephone Encounter (Signed)
[name] is calling looking for a form from [facility] that was faxed on 10th and 12th for the physician order sheet that was not signed they are needing it to move forward.

## 2020-07-06 NOTE — Telephone Encounter (Signed)
[name] from [facility] called to request Pimavanserin Tartrate (NUPLAZID) 34 MG CAPS doc. be signed, dated & faxed back to office regarding samples for pt. He stated that section was not signed.  His contact # [phone]

## 2020-07-06 NOTE — Telephone Encounter (Signed)
Called and spoke to [place], asked him to please stand by fax machine as I was faxing form.  Form is clearly signed/dated 06/12/20 with Dr. Tobey Grim signature on both blocks at bottom of form, for ongoing prescription and [place] 14 day supply (sample) area both signed and dated on 06/12/2020.  There is nothing else that needs to be signed for prescription.    Form has been faxed to Jim on 10/27, 11/1, 11/3 & 11/18 with receipt of good transmission each time.

## 2020-07-06 NOTE — Telephone Encounter (Signed)
Received call from [employer] asking for the form to be faxed to them separately.  Form was faxed to them directly today (also have the OK transmission form from this fax).  She also said the form she had didn't have Dr. Tobey Grim signature.  I asked where she got that copy from, Dr. Jannifer Franklin signed it on the 25th and form was faxed to them (with both signatures on It for ongoing prescription and [place] 14day sample) the 10/27, 11/1, 11/3 and the morning of 11/18 as well as the afternoon of 11/18.  The morning of 07/06/20 was sent to Adrian Prince of [employer], the afternoon of 11/18 was faxed back to the 877# on the form to [employer].  I asked the 2nd caller if they were holding up his medicine over this and she said she didn't know but couldn't begin his medicine without both signatures (the [place] sample one and the ongoing prescription box, I asked if they had the ongoing prescription signed why they couldn't start his medicine), she said he had a copay and I asked her that he has been waiting on this medicine for 6 weeks now.  I asked her to call me if there was anything else he needed.

## 2020-08-15 ENCOUNTER — Telehealth (INDEPENDENT_AMBULATORY_CARE_PROVIDER_SITE_OTHER): Payer: PPO | Admitting: Family Medicine

## 2020-08-15 DIAGNOSIS — R410 Disorientation, unspecified: Secondary | ICD-10-CM

## 2020-08-15 DIAGNOSIS — H5789 Other specified disorders of eye and adnexa: Secondary | ICD-10-CM | POA: Diagnosis not present

## 2020-08-15 DIAGNOSIS — G20A1 Parkinson's disease without dyskinesia, without mention of fluctuations: Secondary | ICD-10-CM

## 2020-08-15 DIAGNOSIS — G20 Parkinson's disease: Secondary | ICD-10-CM | POA: Diagnosis not present

## 2020-08-15 MED ORDER — POLYMYXIN B-TRIMETHOPRIM 10000-0.1 UNIT/ML-% OP SOLN: 2.0000 [drp] | OPHTHALMIC | 0 refills | Status: DC

## 2020-08-15 NOTE — Progress Notes (Signed)
Patient ID: [name] Catching., male   DOB: 1946/05/24, 74 y.o.   MRN: [id number]  This visit type was conducted due to national recommendations for restrictions regarding the COVID-19 pandemic in an effort to limit this patient's exposure and mitigate transmission in our community.   Virtual Visit via Video Note  I connected with [name] on 08/15/20 at  1:45 PM EST by a video enabled telemedicine application and verified that I am speaking with the correct person using two identifiers.  Location patient: home Location provider:work or home office Persons participating in the virtual visit: patient, provider, and patient's son Arlys John who helped with this call  I discussed the limitations of evaluation and management by telemedicine and the availability of in person appointments. The patient expressed understanding and agreed to proceed.   HPI:  Mr. Stege has had some intermittent matting and drainage from both eyes but predominately the right eye for several months.  Especially bothersome recently.  Occasionally has eyes completely matted shut.  Son has noted frequent crusted drainage.  No significant erythema.  Does not wear contacts.  No fever.  Has not tried any drops thus far.  He has chronic problems including Parkinson's disease.  Is followed by neurology.  Recently started on Nuplazid.  Son relates that he has had some increased delusional thoughts.  He thinks he is still working full-time and has projects that need to get done.  He had increased anxiety symptoms.  He takes Seroquel 25 mg at night and Celexa 20 mg daily.  They plan to consult with his neurologist soon.  ROS: See pertinent positives and negatives per HPI.  Past Medical History:  Diagnosis Date  . Abdominal pain, right lower quadrant   . Abnormality of gait 05/16/2016  . Acute prostatitis   . Anxiety   . Constipation   . Degenerative disc disease   . Depression   . Diverticulosis of colon (without mention of  hemorrhage)   . Essential and other specified forms of tremor   . Family history of malignant neoplasm of gastrointestinal tract   . Fibromyalgia   . Ganglion and cyst of synovium, tendon, and bursa   . GERD (gastroesophageal reflux disease)   . Hemarthrosis, upper arm   . Hyperlipidemia   . Hypersomnia with sleep apnea, unspecified   . Hypertension   . Hypopotassemia   . Localized osteoarthrosis not specified whether primary or secondary, lower leg   . Lumbago   . Memory difficulties 04/22/2013  . Nocturia   . Osteoporosis, unspecified   . Other testicular hypofunction   . Parkinson disease ([facility]) 05/16/2016  . Right inguinal hernia   . Sleep apnea    last sleep study 11/11 on chart- Bipap with settings of 4 per  patient  . Spinal stenosis, lumbar region, without neurogenic claudication   . Syncope and collapse   . Unspecified adverse effect of unspecified drug, medicinal and biological substance   . Unspecified gastritis and gastroduodenitis without mention of hemorrhage   . Urinary frequency     Past Surgical History:  Procedure Laterality Date  . BACK SURGERY     x 3  . HERNIA REPAIR  1965, 1968  . INGUINAL HERNIA REPAIR  07/19/2011   Procedure: LAPAROSCOPIC INGUINAL HERNIA;  Surgeon: Adolph Pollack, MD;  Location: [facility];  Service: General;  Laterality: Right;  Laparoscopic Repair of Recurrent Right  Ingunial Hernia with Mesh  . microdisectomy  03/02/2003, 06/29/2003, 12/24/2005  . TONSILLECTOMY  Family History  Problem Relation Age of Onset  . Colon cancer Mother   . Dementia Mother   . Parkinsonism Father 55  . Lung cancer Paternal Grandfather        smoker  . Liver disease Neg Hx   . Kidney disease Neg Hx   . Esophageal cancer Neg Hx     SOCIAL HX: Lives in assisted living.   Current Outpatient Medications:  .  trimethoprim-polymyxin b (POLYTRIM) ophthalmic solution, Place 2 drops into both eyes every 4 (four) hours. Placed 2 drops into both eyes every 4  hours while awake for 7 days, Disp: 10 mL, Rfl: 0 .  amLODipine (NORVASC) 10 MG tablet, Take 1 tablet (10 mg total) by mouth daily., Disp: 90 tablet, Rfl: 3 .  Calcium Carbonate-Vitamin D (CALCIUM 600+D) 600-400 MG-UNIT tablet, Take 1 tablet by mouth daily., Disp: 90 tablet, Rfl: 3 .  carbidopa-levodopa (SINEMET IR) 25-100 MG tablet, TAKE 1 & 1/2 TABLETS BY MOUTH EACH MORNING. TAKE (1) TABLET BY MOUTH MIDDAY AND IN THE EVENING., Disp: 105 tablet, Rfl: 5 .  Cholecalciferol (VITAMIN D3) 125 MCG (5000 UT) CAPS, Take 1 capsule by mouth daily., Disp: , Rfl:  .  citalopram (CELEXA) 20 MG tablet, Take 1 tablet (20 mg total) by mouth daily., Disp: 90 tablet, Rfl: 1 .  diclofenac Sodium (VOLTAREN) 1 % GEL, Apply 2 g topically as needed (lateral hip pain; for bursitis)., Disp: , Rfl:  .  entacapone (COMTAN) 200 MG tablet, TAKE (1) TABLET BY MOUTH (3) TIMES DAILY., Disp: 90 tablet, Rfl: 0 .  famotidine (PEPCID) 20 MG tablet, Take 1 tablet (20 mg total) by mouth 2 (two) times daily., Disp: 60 tablet, Rfl: 3 .  KLOR-CON M20 20 MEQ tablet, TAKE 3 TABLETS (60 MEQ TOTAL) BY MOUTH DAILY., Disp: 90 tablet, Rfl: 1 .  Krill Oil 1000 MG CAPS, Take 1 capsule by mouth 2 (two) times daily., Disp: , Rfl:  .  losartan (COZAAR) 100 MG tablet, TAKE 1 TABLET (100 MG TOTAL) BY MOUTH DAILY., Disp: 90 tablet, Rfl: 2 .  MAGNESIUM CITRATE PO, Take 250 mg by mouth 2 (two) times daily. , Disp: , Rfl:  .  memantine (NAMENDA) 10 MG tablet, TAKE 1 TABLET (10 MG TOTAL) BY MOUTH 2 (TWO) TIMES DAILY., Disp: 180 tablet, Rfl: 3 .  Pimavanserin Tartrate (NUPLAZID) 34 MG CAPS, Take 1 capsule (34 mg total) by mouth daily. (Patient not taking: Reported on 06/22/2020), Disp: 30 capsule, Rfl: 3 .  polyethylene glycol (MIRALAX / GLYCOLAX) 17 g packet, Take 17 g by mouth daily., Disp: 14 each, Rfl: 3 .  Probiotic Product (ALIGN) 4 MG CAPS, Take by mouth daily. , Disp: , Rfl:  .  QUEtiapine (SEROQUEL) 25 MG tablet, 1 tablet at bedtime, Disp: 45 tablet,  Rfl: 2 .  vitamin B-12 (CYANOCOBALAMIN) 50 MCG tablet, Take 50 mcg by mouth daily. , Disp: , Rfl:   EXAM:  VITALS per patient if applicable:  GENERAL: alert, oriented, appears well and in no acute distress  HEENT: atraumatic, conjunttiva clear, no obvious abnormalities on inspection of external nose and ears  NECK: normal movements of the head and neck  LUNGS: on inspection no signs of respiratory distress, breathing rate appears normal, no obvious gross SOB, gasping or wheezing  CV: no obvious cyanosis  MS: moves all visible extremities without noticeable abnormality  PSYCH/NEURO: pleasant and cooperative, no obvious depression or anxiety, speech and thought processing grossly intact  ASSESSMENT AND PLAN:  Discussed the  following assessment and plan:  #1 bilateral eye symptoms of frequent crusted drainage.  Question bacterial conjunctivitis.  Will explain other entities such as blepharitis can be chronic and persistent  -We recommend trial of Polytrim ophthalmic drops 2 drops each eye every 4 hours while awake for the next week.  If not clearing with that recommend office follow-up to further assess  #2 Parkinson's disease with some mild behavioral disturbance  -We recommend he discuss with his neurologist their input regarding medications.  Currently on low-dose Seroquel at night and Lexapro during the day.     I discussed the assessment and treatment plan with the patient. The patient was provided an opportunity to ask questions and all were answered. The patient agreed with the plan and demonstrated an understanding of the instructions.   The patient was advised to call back or seek an in-person evaluation if the symptoms worsen or if the condition fails to improve as anticipated.     Evelena Peat, MD

## 2020-08-18 DIAGNOSIS — D519 Vitamin B12 deficiency anemia, unspecified: Secondary | ICD-10-CM | POA: Diagnosis not present

## 2020-08-18 DIAGNOSIS — E039 Hypothyroidism, unspecified: Secondary | ICD-10-CM | POA: Diagnosis not present

## 2020-08-18 DIAGNOSIS — I10 Essential (primary) hypertension: Secondary | ICD-10-CM | POA: Diagnosis not present

## 2020-08-18 DIAGNOSIS — E559 Vitamin D deficiency, unspecified: Secondary | ICD-10-CM | POA: Diagnosis not present

## 2020-08-31 DIAGNOSIS — H2513 Age-related nuclear cataract, bilateral: Secondary | ICD-10-CM | POA: Diagnosis not present

## 2020-09-01 ENCOUNTER — Encounter: Payer: Self-pay | Admitting: Family Medicine

## 2020-09-01 ENCOUNTER — Telehealth: Payer: Self-pay

## 2020-09-01 NOTE — Telephone Encounter (Signed)
Hold losartan for 1 day for systolic blood pressure less than 100 and continue to hold if systolic remains less than 100

## 2020-09-01 NOTE — Telephone Encounter (Signed)
Facility needs written order with instructions signed and faxed. Attn  sharon vroom [phone]

## 2020-09-01 NOTE — Telephone Encounter (Signed)
[facility] calling regarding patients blood pressure. 114/57 this afternoon. No sxs. Patient feels ok. Patient did take bp medication this morning he is scheduled to take again tonight but tech would like advice on bp being low. Call back [phone]   He takes Sinemet IR 25-100 mg bid, losartan 100 mg daily

## 2020-09-20 ENCOUNTER — Other Ambulatory Visit: Payer: Self-pay

## 2020-09-20 ENCOUNTER — Ambulatory Visit: Payer: PPO | Admitting: Neurology

## 2020-09-20 ENCOUNTER — Encounter: Payer: Self-pay | Admitting: Neurology

## 2020-09-20 ENCOUNTER — Telehealth: Payer: Self-pay | Admitting: Neurology

## 2020-09-20 VITALS — BP 145/75 | HR 56 | Ht 69.0 in | Wt 149.6 lb

## 2020-09-20 DIAGNOSIS — G20 Parkinson's disease: Secondary | ICD-10-CM

## 2020-09-20 DIAGNOSIS — G20A1 Parkinson's disease without dyskinesia, without mention of fluctuations: Secondary | ICD-10-CM

## 2020-09-20 DIAGNOSIS — R413 Other amnesia: Secondary | ICD-10-CM | POA: Diagnosis not present

## 2020-09-20 MED ORDER — CARBIDOPA-LEVODOPA 25-100 MG PO TABS: 1.0000 | ORAL_TABLET | Freq: Three times a day (TID) | ORAL | 5 refills | Status: DC

## 2020-09-20 MED ORDER — CITALOPRAM HYDROBROMIDE 40 MG PO TABS: 40.0000 mg | ORAL_TABLET | Freq: Every day | ORAL | 1 refills | Status: DC

## 2020-09-20 MED ORDER — QUETIAPINE FUMARATE 25 MG PO TABS: ORAL_TABLET | ORAL | 2 refills | Status: DC

## 2020-09-20 NOTE — Telephone Encounter (Signed)
Ed from [employer]. called & asked for clarification for carbidopa-levodopa (SINEMET IR) 25-100 MG tablet.  Best contact: [phone]

## 2020-09-20 NOTE — Progress Notes (Signed)
Reason for visit: Parkinson's disease, gait disorder, dementia, hallucinations  [name]. is an 75 y.o. male  History of present illness:  Mr. Loera is a 75 year old right-handed white male with a history of Parkinson's disease.  The patient is on Sinemet taking the 25/100 mg tablets taking 1.5 tablets in the morning and 1 tablet at midday and in the evening.  The patient reports that he has a lot of problems getting up out of bed in the morning, he usually gets up around 6 AM.  He comes in today with 2 of his sons.  They indicate that by mid to late afternoon he begins to have increased problems with hallucinations and delusional thinking.  This is resulting in increased anxiety issues.  The patient has not had any falls, uses a walker for ambulation and seems to do well with this.  He is reporting no difficulty with chewing or swallowing.  The patient has some apraxia with the use of his telephone.  He is on Nuplazid currently and gets 25 mg of Seroquel, but continues to have problems with hallucinations, and the patient reports some concern about cognitive decline.  He remains on Namenda.  He returns for an evaluation.  Past Medical History:  Diagnosis Date  . Abdominal pain, right lower quadrant   . Abnormality of gait 05/16/2016  . Acute prostatitis   . Anxiety   . Constipation   . Degenerative disc disease   . Depression   . Diverticulosis of colon (without mention of hemorrhage)   . Essential and other specified forms of tremor   . Family history of malignant neoplasm of gastrointestinal tract   . Fibromyalgia   . Ganglion and cyst of synovium, tendon, and bursa   . GERD (gastroesophageal reflux disease)   . Hemarthrosis, upper arm   . Hyperlipidemia   . Hypersomnia with sleep apnea, unspecified   . Hypertension   . Hypopotassemia   . Localized osteoarthrosis not specified whether primary or secondary, lower leg   . Lumbago   . Memory difficulties 04/22/2013  .  Nocturia   . Osteoporosis, unspecified   . Other testicular hypofunction   . Parkinson disease ([place]) 05/16/2016  . Right inguinal hernia   . Sleep apnea    last sleep study 11/11 on chart- Bipap with settings of 4 per  patient  . Spinal stenosis, lumbar region, without neurogenic claudication   . Syncope and collapse   . Unspecified adverse effect of unspecified drug, medicinal and biological substance   . Unspecified gastritis and gastroduodenitis without mention of hemorrhage   . Urinary frequency     Past Surgical History:  Procedure Laterality Date  . BACK SURGERY     x 3  . [place]  . INGUINAL HERNIA REPAIR  07/19/2011   Procedure: LAPAROSCOPIC INGUINAL HERNIA;  Surgeon: Odis Hollingshead, MD;  Location: [facility];  Service: General;  Laterality: Right;  Laparoscopic Repair of Recurrent Right  Ingunial Hernia with Mesh  . microdisectomy  03/02/2003, 06/29/2003, 12/24/2005  . TONSILLECTOMY      Family History  Problem Relation Age of Onset  . Colon cancer Mother   . Dementia Mother   . Parkinsonism Father 47  . Lung cancer Paternal Grandfather        smoker  . Liver disease Neg Hx   . Kidney disease Neg Hx   . Esophageal cancer Neg Hx     Social history:  reports that  he has never smoked. He has never used smokeless tobacco. He reports that he does not drink alcohol and does not use drugs.    Allergies  Allergen Reactions  . Nucynta [Tapentadol] Other (See Comments)    dizziness  . Pregabalin     REACTION: dizziness and mental status changes  . Exelon [Rivastigmine]     abd pain    Medications:  Prior to Admission medications   Medication Sig Start Date End Date Taking? Authorizing Provider  amLODipine (NORVASC) 10 MG tablet Take 1 tablet (10 mg total) by mouth daily. 08/10/19  Yes Burchette, Alinda Sierras, MD  Calcium Carbonate-Vitamin D (CALCIUM 600+D) 600-400 MG-UNIT tablet Take 1 tablet by mouth daily. 04/10/18  Yes Burchette, Alinda Sierras, MD   carbidopa-levodopa (SINEMET IR) 25-100 MG tablet TAKE 1 & 1/2 TABLETS BY MOUTH EACH MORNING. TAKE (1) TABLET BY MOUTH MIDDAY AND IN THE EVENING. 10/14/18  Yes Kathrynn Ducking, MD  Cholecalciferol (VITAMIN D3) 125 MCG (5000 UT) CAPS Take 1 capsule by mouth daily. 08/23/19  Yes [provider]  citalopram (CELEXA) 20 MG tablet Take 1 tablet (20 mg total) by mouth daily. 02/03/18  Yes Burchette, Alinda Sierras, MD  entacapone (COMTAN) 200 MG tablet TAKE (1) TABLET BY MOUTH (3) TIMES DAILY. 11/13/18  Yes Kathrynn Ducking, MD  famotidine (PEPCID) 20 MG tablet Take 1 tablet (20 mg total) by mouth 2 (two) times daily. 12/14/18  Yes Burchette, Alinda Sierras, MD  KLOR-CON M20 20 MEQ tablet TAKE 3 TABLETS (60 MEQ TOTAL) BY MOUTH DAILY. 01/02/18  Yes Burchette, Alinda Sierras, MD  Krill Oil 1000 MG CAPS Take 1 capsule by mouth 2 (two) times daily.   Yes [provider]  losartan (COZAAR) 100 MG tablet TAKE 1 TABLET (100 MG TOTAL) BY MOUTH DAILY. 09/16/17  Yes Burchette, Alinda Sierras, MD  MAGNESIUM CITRATE PO Take 250 mg by mouth 2 (two) times daily.   Yes [provider]  memantine (NAMENDA) 10 MG tablet TAKE 1 TABLET (10 MG TOTAL) BY MOUTH 2 (TWO) TIMES DAILY. 12/08/17  Yes Kathrynn Ducking, MD  Pimavanserin Tartrate (NUPLAZID) 34 MG CAPS Take 1 capsule (34 mg total) by mouth daily. 05/16/20  Yes Kathrynn Ducking, MD  polyethylene glycol (MIRALAX / GLYCOLAX) 17 g packet Take 17 g by mouth daily. 05/16/20  Yes Kathrynn Ducking, MD  Probiotic Product (ALIGN) 4 MG CAPS Take by mouth daily.   Yes [provider]  QUEtiapine (SEROQUEL) 25 MG tablet 1 tablet at bedtime 05/17/20  Yes Kathrynn Ducking, MD  trimethoprim-polymyxin b (POLYTRIM) ophthalmic solution Place 2 drops into both eyes every 4 (four) hours. Placed 2 drops into both eyes every 4 hours while awake for 7 days 08/15/20  Yes Burchette, Alinda Sierras, MD  vitamin B-12 (CYANOCOBALAMIN) 50 MCG tablet Take 50 mcg by mouth daily.   Yes [provider]  diclofenac Sodium (VOLTAREN) 1 % GEL Apply 2 g topically as needed (lateral hip pain; for bursitis).    [provider]    ROS:  Out of a complete 14 system review of symptoms, the patient complains only of the following symptoms, and all other reviewed systems are negative.  Memory problems Hallucinations Walking difficulty  Blood pressure (!) 145/75, pulse (!) 56, height 5\' 9"  (1.753 m), weight 149 lb 9.6 oz (67.9 kg).  Physical Exam  General: The patient is alert and cooperative at the time of the examination.  Skin: No significant peripheral edema is noted.  Neurologic Exam  Mental status: The patient is alert and oriented x 3 at the time of the examination. The is able to follow verbal commands well.   Cranial nerves: Facial symmetry is present. Speech is normal, no aphasia or dysarthria is noted. Extraocular movements are full. Visual fields are full.  Masking the face is seen.  Motor: The patient has good strength in all 4 extremities.  Sensory examination: Soft touch sensation is symmetric on the face, arms, and legs.  Coordination: The patient has good finger-nose-finger and heel-to-shin bilaterally.  The patient has a tendency to lean to the right.  Gait and station: The patient requires some assistance with standing.  Once up, he can walk with a walker, he has fairly good stride and good turns, stooped posture is seen.  Romberg is negative.  Tandem gait was not attempted.  Reflexes: Deep tendon reflexes are symmetric.   Assessment/Plan:  1.  Parkinson's disease  2.  Gait disorder  3.  Hallucinations, delusional thinking  4.  Dementia  The patient will be increased with the Celexa for the anxiety taking 40 mg daily.  The Sinemet dose will be reduced to taking one 25/100 mg tablet 3 times a [name] with Comtan.  The Seroquel dose will be moved to 2 PM, he will continue the Nuplazid.  He will follow up here in 4 months.  Jill Alexanders  MD 09/20/2020 9:27 AM  [facility] [address] [place], [place] [address]  Phone [phone] Fax [phone]

## 2020-09-20 NOTE — Telephone Encounter (Signed)
Called Ed back at pharmacy, went over Dr. Tobey Grim latest documentation on 09/20/20 office visit, stating sinemet is being reduced to 1 of a 25/100 sinemet.  Ed verbalized understanding and expressed appreciation.

## 2020-09-20 NOTE — Patient Instructions (Signed)
We will reduce the sinemet 25/100 to one tablet three times a day. Increase the celexa to 40 mg a day. Change the seroquel dose to 25 mg at 2 pm.

## 2020-10-10 NOTE — Telephone Encounter (Signed)
error 

## 2020-11-03 ENCOUNTER — Telehealth: Payer: Self-pay | Admitting: Family Medicine

## 2020-11-03 NOTE — Telephone Encounter (Signed)
I called and spoke with [name]. She is aware of the message below & that the pt has an appointment with PCP on Monday.

## 2020-11-03 NOTE — Telephone Encounter (Signed)
Needs to be seen- over weekend if symptoms persist or worsen.

## 2020-11-03 NOTE — Telephone Encounter (Signed)
FYI:  Director Barnett Applebaum) is calling in stating that the pt at lunch time was feeling clammy, weak, dizzy, hot and cold pt was unable to stand and walk his Bp was 182/82  99% on room air.

## 2020-11-06 ENCOUNTER — Telehealth: Payer: Self-pay | Admitting: Neurology

## 2020-11-06 ENCOUNTER — Encounter: Payer: Self-pay | Admitting: Family Medicine

## 2020-11-06 ENCOUNTER — Other Ambulatory Visit: Payer: Self-pay

## 2020-11-06 ENCOUNTER — Ambulatory Visit (INDEPENDENT_AMBULATORY_CARE_PROVIDER_SITE_OTHER): Payer: PPO | Admitting: Family Medicine

## 2020-11-06 VITALS — BP 140/80 | HR 53 | Ht 69.0 in | Wt 144.0 lb

## 2020-11-06 DIAGNOSIS — R42 Dizziness and giddiness: Secondary | ICD-10-CM | POA: Diagnosis not present

## 2020-11-06 DIAGNOSIS — R195 Other fecal abnormalities: Secondary | ICD-10-CM | POA: Diagnosis not present

## 2020-11-06 DIAGNOSIS — I10 Essential (primary) hypertension: Secondary | ICD-10-CM | POA: Diagnosis not present

## 2020-11-06 DIAGNOSIS — R531 Weakness: Secondary | ICD-10-CM | POA: Diagnosis not present

## 2020-11-06 DIAGNOSIS — G20 Parkinson's disease: Secondary | ICD-10-CM

## 2020-11-06 NOTE — Progress Notes (Signed)
Established Patient Office Visit  Subjective:  Patient ID: [name] Brooking., male    DOB: 05/24/1946  Age: 75 y.o. MRN: [id number]  CC:  Chief Complaint  Patient presents with  . Hypertension    HPI [name] Mckim. presents for office follow-up after call last week stating [name] had a blood pressure at his residence of 182/82 and reports that [name] was "weak, dizzy, clammy, and having difficulties ambulating."  Apparently his blood pressure calmed down over the weekend.  [name] has known hypertension and takes amlodipine and losartan.  [name] is still ambulating with a walker and his son (who accompanies him today) states that his gait seems to be more unsteady than in the past.  [name] has not had any fever.  No dysuria.  No cough.  No chills.  Son has noticed that [name] we checks on him [name] seems to be sometimes noted to have some diaphoresis.  [name] has not had any documented fevers.  Has had some recent mild bilateral leg edema.  His appetite is reportedly stable.  No recent chest pains.  Chronic medications include Seroquel 25 mg once daily, Nuplazid 34 mg daily, Namenda 10 mg twice daily, losartan 100 mg daily, K-Lor con 20 mEq 3 tablets daily, Comtan 200 mg 1 3 times daily, citalopram 40 mg daily, Sinemet 25/101 tablet 3 times daily, and amlodipine 10 mg daily.  [name] does have history of constipation.  Son states recently has had loose stools.  [name] apparently had been getting MiraLAX daily and this was changed to as needed.  [name] does take magnesium citrate 2 capsules twice daily.  Son had impression that [name] still has some loose stools.  Past Medical History:  Diagnosis Date  . Abdominal pain, right lower quadrant   . Abnormality of gait 05/16/2016  . Acute prostatitis   . Anxiety   . Constipation   . Degenerative disc disease   . Depression   . Diverticulosis of colon (without mention of hemorrhage)   . Essential and other specified forms of tremor   . Family history of malignant neoplasm of  gastrointestinal tract   . Fibromyalgia   . Ganglion and cyst of synovium, tendon, and bursa   . GERD (gastroesophageal reflux disease)   . Hemarthrosis, upper arm   . Hyperlipidemia   . Hypersomnia with sleep apnea, unspecified   . Hypertension   . Hypopotassemia   . Localized osteoarthrosis not specified whether primary or secondary, lower leg   . Lumbago   . Memory difficulties 04/22/2013  . Nocturia   . Osteoporosis, unspecified   . Other testicular hypofunction   . Parkinson disease ([place]) 05/16/2016  . Right inguinal hernia   . Sleep apnea    last sleep study 11/11 on chart- Bipap with settings of 4 per  patient  . Spinal stenosis, lumbar region, without neurogenic claudication   . Syncope and collapse   . Unspecified adverse effect of unspecified drug, medicinal and biological substance   . Unspecified gastritis and gastroduodenitis without mention of hemorrhage   . Urinary frequency     Past Surgical History:  Procedure Laterality Date  . BACK SURGERY     x 3  . [place]  . INGUINAL HERNIA REPAIR  07/19/2011   Procedure: LAPAROSCOPIC INGUINAL HERNIA;  Surgeon: Odis Hollingshead, MD;  Location: [facility];  Service: General;  Laterality: Right;  Laparoscopic Repair of Recurrent Right  Ingunial Hernia with Mesh  . microdisectomy  03/02/2003, 06/29/2003, 12/24/2005  . TONSILLECTOMY      Family History  Problem Relation Age of Onset  . Colon cancer Mother   . Dementia Mother   . Parkinsonism Father 69  . Lung cancer Paternal Grandfather        smoker  . Liver disease Neg Hx   . Kidney disease Neg Hx   . Esophageal cancer Neg Hx     Social History   Socioeconomic History  . Marital status: Widowed    Spouse name: Not on file  . Number of children: 2  . Years of education: college  . Highest education level: Not on file  Occupational History  . Occupation: retired    Fish farm manager: RETIRED  Tobacco Use  . Smoking status: Never Smoker  . Smokeless  tobacco: Never Used  Vaping Use  . Vaping Use: Never used  Substance and Sexual Activity  . Alcohol use: No    Alcohol/week: 0.0 standard drinks    Comment: less than 5 per week  . Drug use: No  . Sexual activity: Not on file  Other Topics Concern  . Not on file  Social History Narrative   Lives at [place] [place]   Patient is right handed.   Patient drinks 1 cup of caffeine per day.   Social Determinants of Health   Financial Resource Strain: Low Risk   . Difficulty of Paying Living Expenses: Not hard at all  Food Insecurity: No Food Insecurity  . Worried About Charity fundraiser in the Last Year: Never true  . Ran Out of Food in the Last Year: Never true  Transportation Needs: No Transportation Needs  . Lack of Transportation (Medical): No  . Lack of Transportation (Non-Medical): No  Physical Activity: Inactive  . Days of Exercise per Week: 0 days  . Minutes of Exercise per Session: 0 min  Stress: No Stress Concern Present  . Feeling of Stress : Only a little  Social Connections: Moderately Isolated  . Frequency of Communication with Friends and Family: More than three times a week  . Frequency of Social Gatherings with Friends and Family: Once a week  . Attends Religious Services: More than 4 times per year  . Active Member of Clubs or Organizations: No  . Attends Archivist Meetings: Never  . Marital Status: Widowed  Intimate Partner Violence: Not At Risk  . Fear of Current or Ex-Partner: No  . Emotionally Abused: No  . Physically Abused: No  . Sexually Abused: No    Outpatient Medications Prior to Visit  Medication Sig Dispense Refill  . amLODipine (NORVASC) 10 MG tablet Take 1 tablet (10 mg total) by mouth daily. 90 tablet 3  . Calcium Carbonate-Vitamin D (CALCIUM 600+D) 600-400 MG-UNIT tablet Take 1 tablet by mouth daily. 90 tablet 3  . carbidopa-levodopa (SINEMET IR) 25-100 MG tablet Take 1 tablet by mouth 3 (three) times daily. 90 tablet 5   . Cholecalciferol (VITAMIN D3) 125 MCG (5000 UT) CAPS Take 1 capsule by mouth daily.    . citalopram (CELEXA) 40 MG tablet Take 1 tablet (40 mg total) by mouth daily. 90 tablet 1  . diclofenac Sodium (VOLTAREN) 1 % GEL Apply 2 g topically as needed (lateral hip pain; for bursitis).    . entacapone (COMTAN) 200 MG tablet TAKE (1) TABLET BY MOUTH (3) TIMES DAILY. 90 tablet 0  . famotidine (PEPCID) 20 MG tablet Take 1 tablet (20 mg total) by mouth 2 (two) times daily.  60 tablet 3  . KLOR-CON M20 20 MEQ tablet TAKE 3 TABLETS (60 MEQ TOTAL) BY MOUTH DAILY. 90 tablet 1  . Krill Oil 1000 MG CAPS Take 1 capsule by mouth 2 (two) times daily.    Marland Kitchen losartan (COZAAR) 100 MG tablet TAKE 1 TABLET (100 MG TOTAL) BY MOUTH DAILY. 90 tablet 2  . MAGNESIUM CITRATE PO Take 250 mg by mouth 2 (two) times daily.    . memantine (NAMENDA) 10 MG tablet TAKE 1 TABLET (10 MG TOTAL) BY MOUTH 2 (TWO) TIMES DAILY. 180 tablet 3  . Pimavanserin Tartrate (NUPLAZID) 34 MG CAPS Take 1 capsule (34 mg total) by mouth daily. 30 capsule 3  . polyethylene glycol (MIRALAX / GLYCOLAX) 17 g packet Take 17 g by mouth daily. 14 each 3  . Probiotic Product (ALIGN) 4 MG CAPS Take by mouth daily.    . QUEtiapine (SEROQUEL) 25 MG tablet 1 tablet at 2 pm 45 tablet 2  . trimethoprim-polymyxin b (POLYTRIM) ophthalmic solution Place 2 drops into both eyes every 4 (four) hours. Placed 2 drops into both eyes every 4 hours while awake for 7 days 10 mL 0  . vitamin B-12 (CYANOCOBALAMIN) 50 MCG tablet Take 50 mcg by mouth daily.     No facility-administered medications prior to visit.    Allergies  Allergen Reactions  . Nucynta [Tapentadol] Other (See Comments)    dizziness  . Pregabalin     REACTION: dizziness and mental status changes  . Exelon [Rivastigmine]     abd pain    ROS Review of Systems  Constitutional: Positive for fatigue. Negative for chills and fever.  Respiratory: Negative for cough.   Cardiovascular: Positive for leg  swelling. Negative for chest pain.  Gastrointestinal: Negative for abdominal pain, blood in stool, nausea and vomiting.  Genitourinary: Negative for dysuria.  Hematological: Negative for adenopathy.      Objective:    Physical Exam Vitals reviewed.  Constitutional:      Appearance: Normal appearance.  Cardiovascular:     Rate and Rhythm: Normal rate.  Pulmonary:     Effort: Pulmonary effort is normal.     Breath sounds: Normal breath sounds.  Musculoskeletal:     Comments: Trace pitting edema lower legs bilaterally.  Neurological:     Mental Status: [name] is alert.     BP 140/80   Pulse (!) 53   Ht 5\' 9"  (1.753 m)   Wt 144 lb (65.3 kg)   SpO2 98%   BMI 21.27 kg/m  Wt Readings from Last 3 Encounters:  11/06/20 144 lb (65.3 kg)  09/20/20 149 lb 9.6 oz (67.9 kg)  06/22/20 143 lb 3 oz (64.9 kg)     Health Maintenance Due  Topic Date Due  . COLONOSCOPY (Pts 45-74yrs Insurance coverage will need to be confirmed)  02/03/2016  . COVID-19 Vaccine (3 - Moderna risk 4-dose series) 10/30/2019    There are no preventive care reminders to display for this patient.  Lab Results  Component Value Date   TSH 1.61 01/01/2016   Lab Results  Component Value Date   WBC 6.0 07/01/2018   HGB 13.6 07/01/2018   HCT 39.3 07/01/2018   MCV 93.7 07/01/2018   PLT 178.0 07/01/2018   Lab Results  Component Value Date   NA 142 08/10/2019   K 4.1 08/10/2019   CO2 33 (H) 08/10/2019   GLUCOSE 90 08/10/2019   BUN 18 08/10/2019   CREATININE 1.04 08/10/2019   BILITOT 0.8 08/10/2019  ALKPHOS 104 08/10/2019   AST 10 08/10/2019   ALT 4 08/10/2019   PROT 6.7 08/10/2019   ALBUMIN 4.1 08/10/2019   CALCIUM 9.6 08/10/2019   GFR 69.71 08/10/2019   Lab Results  Component Value Date   CHOL 203 (H) 03/02/2014   Lab Results  Component Value Date   HDL 76.90 03/02/2014   Lab Results  Component Value Date   LDLCALC 118 (H) 03/02/2014   Lab Results  Component Value Date   TRIG 41.0  03/02/2014   Lab Results  Component Value Date   CHOLHDL 3 03/02/2014   Lab Results  Component Value Date   HGBA1C 5.9 01/23/2010      Assessment & Plan:   #1 hypertension.  [name] had recent elevated readings at his place of residence but improved today.  We obtain reading after rest left arm seated 118/62 and standing 120/60 -Continue current medications with losartan and amlodipine. -Need to try to keep sodium intake less than 2400 mg daily but they really have no way to monitor this through his diet where [name] stays  #2 recent nonspecific symptoms of weakness and dizziness.  Difficult to sort out how much of this is his Parkinson's and possibly some autonomic dysfunction versus other.  [name] has not had any recent labs. -Check TSH, CBC, comprehensive metabolic panel. -Definitely needs electrolyte monitoring with potassium therapy.  #3 history of anxiety.  [name] apparently had some delusional thinking at times.  On low-dose Seroquel as above per neurology. -Consider possible reduction in Celexa from 40 to 20 mg given his age  #4 frequent loose stools. -Have him reduce his magnesium citrate to 2 capsules once daily from twice daily dosing and be in touch of loose stools persist  No orders of the defined types were placed in this encounter.   Follow-up: No follow-ups on file.    Carolann Littler, MD

## 2020-11-06 NOTE — Telephone Encounter (Signed)
Pt is requesting a refill for QUEtiapine (SEROQUEL) 25 MG tablet and citalopram (CELEXA) 40 MG tablet.  [facility]: [facility]

## 2020-11-07 ENCOUNTER — Other Ambulatory Visit: Payer: Self-pay | Admitting: *Deleted

## 2020-11-07 LAB — CBC WITH DIFFERENTIAL/PLATELET
Basophils Absolute: 0.1 10*3/uL (ref 0.0–0.1)
Basophils Relative: 1.1 % (ref 0.0–3.0)
Eosinophils Absolute: 0.1 10*3/uL (ref 0.0–0.7)
Eosinophils Relative: 1.8 % (ref 0.0–5.0)
HCT: 38.5 % — ABNORMAL LOW (ref 39.0–52.0)
Hemoglobin: 13.3 g/dL (ref 13.0–17.0)
Lymphocytes Relative: 20.1 % (ref 12.0–46.0)
Lymphs Abs: 1.2 10*3/uL (ref 0.7–4.0)
MCHC: 34.5 g/dL (ref 30.0–36.0)
MCV: 94.1 fl (ref 78.0–100.0)
Monocytes Absolute: 0.7 10*3/uL (ref 0.1–1.0)
Monocytes Relative: 11.3 % (ref 3.0–12.0)
Neutro Abs: 3.9 10*3/uL (ref 1.4–7.7)
Neutrophils Relative %: 65.7 % (ref 43.0–77.0)
Platelets: 175 10*3/uL (ref 150.0–400.0)
RBC: 4.08 Mil/uL — ABNORMAL LOW (ref 4.22–5.81)
RDW: 13.5 % (ref 11.5–15.5)
WBC: 6 10*3/uL (ref 4.0–10.5)

## 2020-11-07 LAB — COMPREHENSIVE METABOLIC PANEL
ALT: 7 U/L (ref 0–53)
AST: 13 U/L (ref 0–37)
Albumin: 4.1 g/dL (ref 3.5–5.2)
Alkaline Phosphatase: 111 U/L (ref 39–117)
BUN: 28 mg/dL — ABNORMAL HIGH (ref 6–23)
CO2: 29 mEq/L (ref 19–32)
Calcium: 9.3 mg/dL (ref 8.4–10.5)
Chloride: 104 mEq/L (ref 96–112)
Creatinine, Ser: 1.22 mg/dL (ref 0.40–1.50)
GFR: 57.88 mL/min — ABNORMAL LOW (ref >60.00)
Glucose, Bld: 70 mg/dL (ref 70–99)
Potassium: 4.1 mEq/L (ref 3.5–5.1)
Sodium: 142 mEq/L (ref 135–145)
Total Bilirubin: 0.5 mg/dL (ref 0.2–1.2)
Total Protein: 6.7 g/dL (ref 6.0–8.3)

## 2020-11-07 LAB — TSH: TSH: 2.81 u[IU]/mL (ref 0.35–4.50)

## 2020-11-07 MED ORDER — QUETIAPINE FUMARATE 25 MG PO TABS: ORAL_TABLET | ORAL | 2 refills | Status: DC

## 2020-11-07 MED ORDER — CITALOPRAM HYDROBROMIDE 40 MG PO TABS: 40.0000 mg | ORAL_TABLET | Freq: Every day | ORAL | 1 refills | Status: DC

## 2020-11-07 NOTE — Telephone Encounter (Signed)
E-scribed refills as requested.  

## 2020-11-18 ENCOUNTER — Other Ambulatory Visit: Payer: Self-pay | Admitting: Neurology

## 2020-11-23 ENCOUNTER — Telehealth: Payer: Self-pay | Admitting: Family Medicine

## 2020-11-23 NOTE — Telephone Encounter (Signed)
Barnett Applebaum from [facility] is calling to advise the patient has a lot of fluid build up and it's starting to seep out.  She would like to know if he can get [facility].  Vitals: BP is 140/80; Heart Rate is 58; Respirations are 14; Oxygen level is 98%  Please advise.

## 2020-11-23 NOTE — Telephone Encounter (Signed)
Yes    OK to order home health for nursing consult.

## 2020-11-23 NOTE — Telephone Encounter (Signed)
Spoke with Barnett Applebaum. Verbal orders have been given.

## 2020-12-10 ENCOUNTER — Other Ambulatory Visit: Payer: Self-pay

## 2020-12-10 ENCOUNTER — Emergency Department (HOSPITAL_COMMUNITY): Payer: PPO

## 2020-12-10 ENCOUNTER — Emergency Department (HOSPITAL_COMMUNITY)
Admission: EM | Admit: 2020-12-10 | Discharge: 2020-12-11 | Disposition: A | Discharge status: Home / Self Care | Payer: PPO | Attending: Emergency Medicine | Admitting: Emergency Medicine

## 2020-12-10 ENCOUNTER — Encounter (HOSPITAL_COMMUNITY): Payer: Self-pay | Admitting: Emergency Medicine

## 2020-12-10 DIAGNOSIS — Z20822 Contact with and (suspected) exposure to covid-19: Secondary | ICD-10-CM | POA: Diagnosis not present

## 2020-12-10 DIAGNOSIS — I10 Essential (primary) hypertension: Secondary | ICD-10-CM | POA: Diagnosis not present

## 2020-12-10 DIAGNOSIS — I959 Hypotension, unspecified: Secondary | ICD-10-CM | POA: Diagnosis not present

## 2020-12-10 DIAGNOSIS — R1084 Generalized abdominal pain: Secondary | ICD-10-CM | POA: Diagnosis not present

## 2020-12-10 DIAGNOSIS — I517 Cardiomegaly: Secondary | ICD-10-CM | POA: Diagnosis not present

## 2020-12-10 DIAGNOSIS — K5900 Constipation, unspecified: Secondary | ICD-10-CM | POA: Diagnosis not present

## 2020-12-10 DIAGNOSIS — R140 Abdominal distension (gaseous): Secondary | ICD-10-CM | POA: Diagnosis not present

## 2020-12-10 DIAGNOSIS — G20 Parkinson's disease: Secondary | ICD-10-CM | POA: Diagnosis not present

## 2020-12-10 DIAGNOSIS — S22070A Wedge compression fracture of T9-T10 vertebra, initial encounter for closed fracture: Secondary | ICD-10-CM | POA: Diagnosis not present

## 2020-12-10 DIAGNOSIS — Z9889 Other specified postprocedural states: Secondary | ICD-10-CM | POA: Diagnosis not present

## 2020-12-10 DIAGNOSIS — K5641 Fecal impaction: Secondary | ICD-10-CM

## 2020-12-10 DIAGNOSIS — Z79899 Other long term (current) drug therapy: Secondary | ICD-10-CM | POA: Diagnosis not present

## 2020-12-10 DIAGNOSIS — R509 Fever, unspecified: Secondary | ICD-10-CM | POA: Diagnosis not present

## 2020-12-10 LAB — COMPREHENSIVE METABOLIC PANEL
ALT: 10 U/L (ref 0–44)
AST: 23 U/L (ref 15–41)
Albumin: 4 g/dL (ref 3.5–5.0)
Alkaline Phosphatase: 104 U/L (ref 38–126)
Anion gap: 10 (ref 5–15)
BUN: 25 mg/dL — ABNORMAL HIGH (ref 8–23)
CO2: 28 mmol/L (ref 22–32)
Calcium: 8.9 mg/dL (ref 8.9–10.3)
Chloride: 101 mmol/L (ref 98–111)
Creatinine, Ser: 1.18 mg/dL (ref 0.61–1.24)
GFR, Estimated: >60 mL/min (ref >60)
Glucose, Bld: 127 mg/dL — ABNORMAL HIGH (ref 70–99)
Potassium: 3.3 mmol/L — ABNORMAL LOW (ref 3.5–5.1)
Sodium: 139 mmol/L (ref 135–145)
Total Bilirubin: 0.8 mg/dL (ref 0.3–1.2)
Total Protein: 7.2 g/dL (ref 6.5–8.1)

## 2020-12-10 LAB — URINALYSIS, ROUTINE W REFLEX MICROSCOPIC
Bilirubin Urine: NEGATIVE
Glucose, UA: NEGATIVE mg/dL
Hgb urine dipstick: NEGATIVE
Ketones, ur: 5 mg/dL — AB
Leukocytes,Ua: NEGATIVE
Nitrite: NEGATIVE
Protein, ur: NEGATIVE mg/dL
Specific Gravity, Urine: >1.046 — ABNORMAL HIGH (ref 1.005–1.030)
pH: 5 (ref 5.0–8.0)

## 2020-12-10 LAB — LACTIC ACID, PLASMA
Lactic Acid, Venous: 2.7 mmol/L (ref 0.5–1.9)
Lactic Acid, Venous: 2.1 mmol/L (ref 0.5–1.9)

## 2020-12-10 LAB — CBC WITH DIFFERENTIAL/PLATELET
Abs Immature Granulocytes: 0.02 10*3/uL (ref 0.00–0.07)
Basophils Absolute: 0 10*3/uL (ref 0.0–0.1)
Basophils Relative: 0 %
Eosinophils Absolute: 0 10*3/uL (ref 0.0–0.5)
Eosinophils Relative: 1 %
HCT: 38.5 % — ABNORMAL LOW (ref 39.0–52.0)
Hemoglobin: 13.1 g/dL (ref 13.0–17.0)
Immature Granulocytes: 0 %
Lymphocytes Relative: 3 %
Lymphs Abs: 0.2 10*3/uL — ABNORMAL LOW (ref 0.7–4.0)
MCH: 32.6 pg (ref 26.0–34.0)
MCHC: 34 g/dL (ref 30.0–36.0)
MCV: 95.8 fL (ref 80.0–100.0)
Monocytes Absolute: 0.4 10*3/uL (ref 0.1–1.0)
Monocytes Relative: 5 %
Neutro Abs: 7.1 10*3/uL (ref 1.7–7.7)
Neutrophils Relative %: 91 %
Platelets: 158 10*3/uL (ref 150–400)
RBC: 4.02 MIL/uL — ABNORMAL LOW (ref 4.22–5.81)
RDW: 13.5 % (ref 11.5–15.5)
WBC: 7.8 10*3/uL (ref 4.0–10.5)
nRBC: 0 % (ref 0.0–0.2)

## 2020-12-10 LAB — RESP PANEL BY RT-PCR (FLU A&B, COVID) ARPGX2
Influenza A by PCR: NEGATIVE
Influenza B by PCR: NEGATIVE
SARS Coronavirus 2 by RT PCR: NEGATIVE

## 2020-12-10 LAB — LIPASE, BLOOD: Lipase: 147 U/L — ABNORMAL HIGH (ref 11–51)

## 2020-12-10 MED ORDER — SODIUM CHLORIDE 0.9 % IV BOLUS
500.0000 mL | Freq: Once | INTRAVENOUS | Status: AC
  Administered 2020-12-10: 500 mL via INTRAVENOUS

## 2020-12-10 MED ORDER — IOHEXOL 300 MG/ML  SOLN
100.0000 mL | Freq: Once | INTRAMUSCULAR | Status: AC | PRN
  Administered 2020-12-10: 100 mL via INTRAVENOUS

## 2020-12-10 MED ORDER — FLEET ENEMA 7-19 GM/118ML RE ENEM
1.0000 | ENEMA | Freq: Once | RECTAL | Status: DC
  Filled 2020-12-10: qty 1

## 2020-12-10 NOTE — Discharge Instructions (Signed)
Your history, exam, and work-up today were consistent with constipation and fecal impaction.  We were able to manually disimpact you and then after a period of time of monitoring and rehydration, you were able to have a large bowel movement.  As her symptoms have improved and you appear well and there was no significant concerning findings on work-up, we feel you are now safe for discharge home.  Please follow-up with your primary doctor and GI team.  Please start taking your MiraLAX daily for the next week or so to keep things moving and stay hydrated.  If any symptoms change or worsen, please return to the nearest emergency department.

## 2020-12-10 NOTE — ED Notes (Signed)
Walked into room, patient was sitting on bedside commode with son next to him. Patient has a small soft bowel movement. Patient was cleaned and placed back in the bed. Warm blankets were provided. Blood work was sent to lab.

## 2020-12-10 NOTE — ED Provider Notes (Signed)
[facility] DEPT Provider Note   CSN: [id number] Arrival date & time: 12/10/20  1447     History No chief complaint on file.   [name]. is a 75 y.o. male.  The history is provided by the patient and medical records. No language interpreter was used.  Abdominal Pain Pain location:  Generalized Pain quality: aching, cramping and pressure   Pain radiates to:  Does not radiate Pain severity:  Severe Onset quality:  Gradual Duration:  3 days Timing:  Constant Progression:  Unchanged Chronicity:  New Context: not trauma   Relieved by:  Nothing Worsened by:  Nothing Ineffective treatments:  None tried Associated symptoms: constipation and nausea   Associated symptoms: no chest pain, no chills, no cough, no diarrhea, no dysuria, no fatigue, no fever, no flatus (no flatus), no shortness of breath and no vomiting        Past Medical History:  Diagnosis Date  . Abdominal pain, right lower quadrant   . Abnormality of gait 05/16/2016  . Acute prostatitis   . Anxiety   . Constipation   . Degenerative disc disease   . Depression   . Diverticulosis of colon (without mention of hemorrhage)   . Essential and other specified forms of tremor   . Family history of malignant neoplasm of gastrointestinal tract   . Fibromyalgia   . Ganglion and cyst of synovium, tendon, and bursa   . GERD (gastroesophageal reflux disease)   . Hemarthrosis, upper arm   . Hyperlipidemia   . Hypersomnia with sleep apnea, unspecified   . Hypertension   . Hypopotassemia   . Localized osteoarthrosis not specified whether primary or secondary, lower leg   . Lumbago   . Memory difficulties 04/22/2013  . Nocturia   . Osteoporosis, unspecified   . Other testicular hypofunction   . Parkinson disease ([place]) 05/16/2016  . Right inguinal hernia   . Sleep apnea    last sleep study 11/11 on chart- Bipap with settings of 4 per  patient  . Spinal stenosis, lumbar region,  without neurogenic claudication   . Syncope and collapse   . Unspecified adverse effect of unspecified drug, medicinal and biological substance   . Unspecified gastritis and gastroduodenitis without mention of hemorrhage   . Urinary frequency     Patient Active Problem List   Diagnosis Date Noted  . Bradycardia 09/10/2019  . Delusional disorder ([place]) 01/14/2019  . Hallucinations 01/14/2019  . Actinic keratoses 07/27/2017  . Trochanteric bursitis, left hip 12/04/2016  . Parkinson disease ([place]) 05/16/2016  . Abnormality of gait 05/16/2016  . Knee pain 11/01/2013  . Traumatic bursitis 11/01/2013  . Hx of fall 11/01/2013  . Memory difficulties 04/22/2013  . Hemorrhoids 02/24/2013  . PRIMARY CENTRAL SLEEP APNEA 05/21/2010  . NOCTURIA 05/21/2010  . HYPERSOMNIA, ASSOCIATED WITH SLEEP APNEA 03/26/2010  . ABDOMINAL PAIN RIGHT LOWER QUADRANT 03/20/2010  . TREMOR, ESSENTIAL 11/22/2009  . SPINAL STENOSIS, LUMBAR 06/07/2009  . OSTEOARTHRITIS, LOWER LEG, LEFT 05/16/2009  . MYOFASCIAL PAIN SYNDROME 05/16/2009  . URINARY FREQUENCY, CHRONIC 02/09/2009  . DISUSE OSTEOPOROSIS 09/07/2008  . HYPOGONADISM, MALE 07/01/2008  . HYPOKALEMIA 04/19/2008  . SYNCOPE 04/19/2008  . UNS ADVRS EFF UNS RX MEDICINAL&BIOLOGICAL SBSTNC 02/26/2008  . CONSTIPATION, CHRONIC 11/23/2007  . HYPERLIPIDEMIA 05/15/2007  . Right-sided low back pain without sciatica 05/15/2007  . Osteoporosis 05/15/2007  . DIVERTICULOSIS, COLON 02/23/2007  . Essential hypertension 02/12/2007  . GERD 02/12/2007  . GASTRITIS 02/12/2007    Past  Surgical History:  Procedure Laterality Date  . BACK SURGERY     x 3  . [place]  . INGUINAL HERNIA REPAIR  07/19/2011   Procedure: LAPAROSCOPIC INGUINAL HERNIA;  Surgeon: Odis Hollingshead, MD;  Location: [facility];  Service: General;  Laterality: Right;  Laparoscopic Repair of Recurrent Right  Ingunial Hernia with Mesh  . microdisectomy  03/02/2003, 06/29/2003, 12/24/2005  .  TONSILLECTOMY         Family History  Problem Relation Age of Onset  . Colon cancer Mother   . Dementia Mother   . Parkinsonism Father 63  . Lung cancer Paternal Grandfather        smoker  . Liver disease Neg Hx   . Kidney disease Neg Hx   . Esophageal cancer Neg Hx     Social History   Tobacco Use  . Smoking status: Never Smoker  . Smokeless tobacco: Never Used  Vaping Use  . Vaping Use: Never used  Substance Use Topics  . Alcohol use: No    Alcohol/week: 0.0 standard drinks    Comment: less than 5 per week  . Drug use: No    Home Medications Prior to Admission medications   Medication Sig Start Date End Date Taking? Authorizing Provider  amLODipine (NORVASC) 10 MG tablet Take 1 tablet (10 mg total) by mouth daily. 08/10/19   Burchette, Alinda Sierras, MD  Calcium Carbonate-Vitamin D (CALCIUM 600+D) 600-400 MG-UNIT tablet Take 1 tablet by mouth daily. 04/10/18   Burchette, Alinda Sierras, MD  carbidopa-levodopa (SINEMET IR) 25-100 MG tablet Take 1 tablet by mouth 3 (three) times daily. 09/20/20   Kathrynn Ducking, MD  Cholecalciferol (VITAMIN D3) 125 MCG (5000 UT) CAPS Take 1 capsule by mouth daily. 08/23/19   [provider]  citalopram (CELEXA) 40 MG tablet Take 1 tablet (40 mg total) by mouth daily. 11/07/20   Kathrynn Ducking, MD  diclofenac Sodium (VOLTAREN) 1 % GEL Apply 2 g topically as needed (lateral hip pain; for bursitis).    [provider]  entacapone (COMTAN) 200 MG tablet TAKE (1) TABLET BY MOUTH (3) TIMES DAILY. 11/13/18   Kathrynn Ducking, MD  famotidine (PEPCID) 20 MG tablet Take 1 tablet (20 mg total) by mouth 2 (two) times daily. 12/14/18   Burchette, Alinda Sierras, MD  KLOR-CON M20 20 MEQ tablet TAKE 3 TABLETS (60 MEQ TOTAL) BY MOUTH DAILY. 01/02/18   Burchette, Alinda Sierras, MD  Krill Oil 1000 MG CAPS Take 1 capsule by mouth 2 (two) times daily.    [provider]  losartan (COZAAR) 100 MG tablet TAKE 1 TABLET (100 MG TOTAL) BY MOUTH DAILY. 09/16/17    Burchette, Alinda Sierras, MD  MAGNESIUM CITRATE PO Take 250 mg by mouth 2 (two) times daily.    [provider]  memantine (NAMENDA) 10 MG tablet TAKE 1 TABLET (10 MG TOTAL) BY MOUTH 2 (TWO) TIMES DAILY. 12/08/17   Kathrynn Ducking, MD  NUPLAZID 34 MG CAPS TAKE (1) CAPSULE BY MOUTH ONCE DAILY. 11/21/20   Kathrynn Ducking, MD  polyethylene glycol (MIRALAX / GLYCOLAX) 17 g packet Take 17 g by mouth daily. 05/16/20   Kathrynn Ducking, MD  Probiotic Product (ALIGN) 4 MG CAPS Take by mouth daily.    [provider]  QUEtiapine (SEROQUEL) 25 MG tablet 1 tablet at 2 pm 11/07/20   Kathrynn Ducking, MD  trimethoprim-polymyxin b (POLYTRIM) ophthalmic solution Place 2 drops into both eyes every  4 (four) hours. Placed 2 drops into both eyes every 4 hours while awake for 7 days 08/15/20   Eulas Post, MD  vitamin B-12 (CYANOCOBALAMIN) 50 MCG tablet Take 50 mcg by mouth daily.    [provider]    Allergies    Nucynta [tapentadol], Pregabalin, and Exelon [rivastigmine]  Review of Systems   Review of Systems  Constitutional: Negative for chills, fatigue and fever.  HENT: Negative for congestion.   Respiratory: Negative for cough, chest tightness, shortness of breath and wheezing.   Cardiovascular: Negative for chest pain.  Gastrointestinal: Positive for abdominal distention, abdominal pain, constipation and nausea. Negative for diarrhea, flatus (no flatus) and vomiting.  Genitourinary: Negative for dysuria, flank pain and frequency.  Musculoskeletal: Positive for back pain (chronic back pain). Negative for neck pain and neck stiffness.  Skin: Negative for wound.  Neurological: Negative for headaches.  Psychiatric/Behavioral: Negative for agitation and confusion.  All other systems reviewed and are negative.   Physical Exam Updated Vital Signs BP 136/67 (BP Location: Left Arm)   Pulse 65   Temp 100.2 F (37.9 C) (Rectal)   Resp 14   Ht 5\' 9"  (1.753 m)   Wt 65.3 kg    BMI 21.26 kg/m   Physical Exam Vitals and nursing note reviewed.  Constitutional:      General: He is not in acute distress.    Appearance: He is well-developed. He is not ill-appearing, toxic-appearing or diaphoretic.  HENT:     Head: Normocephalic and atraumatic.     Nose: No congestion or rhinorrhea.  Eyes:     Conjunctiva/sclera: Conjunctivae normal.  Cardiovascular:     Rate and Rhythm: Normal rate and regular rhythm.     Heart sounds: No murmur heard.   Pulmonary:     Effort: Pulmonary effort is normal. No respiratory distress.     Breath sounds: Normal breath sounds. No wheezing, rhonchi or rales.  Chest:     Chest wall: No tenderness.  Abdominal:     General: Abdomen is flat. There is distension.     Palpations: Abdomen is soft.     Tenderness: There is abdominal tenderness. There is no right CVA tenderness, left CVA tenderness, guarding or rebound.  Musculoskeletal:        General: No tenderness.     Cervical back: Neck supple. No tenderness.     Right lower leg: No edema.     Left lower leg: No edema.  Skin:    General: Skin is warm and dry.     Capillary Refill: Capillary refill takes less than 2 seconds.     Findings: No erythema.  Neurological:     General: No focal deficit present.     Mental Status: He is alert.  Psychiatric:        Mood and Affect: Mood normal.     ED Results / Procedures / Treatments   Labs (all labs ordered are listed, but only abnormal results are displayed) Labs Reviewed  CBC WITH DIFFERENTIAL/PLATELET - Abnormal; Notable for the following components:      Result Value   RBC 4.02 (*)    HCT 38.5 (*)    Lymphs Abs 0.2 (*)    All other components within normal limits  COMPREHENSIVE METABOLIC PANEL - Abnormal; Notable for the following components:   Potassium 3.3 (*)    Glucose, Bld 127 (*)    BUN 25 (*)    All other components within normal limits  LACTIC ACID,  PLASMA - Abnormal; Notable for the following components:    Lactic Acid, Venous 2.7 (*)    All other components within normal limits  LACTIC ACID, PLASMA - Abnormal; Notable for the following components:   Lactic Acid, Venous 2.1 (*)    All other components within normal limits  LIPASE, BLOOD - Abnormal; Notable for the following components:   Lipase 147 (*)    All other components within normal limits  URINALYSIS, ROUTINE W REFLEX MICROSCOPIC - Abnormal; Notable for the following components:   Specific Gravity, Urine >1.046 (*)    Ketones, ur 5 (*)    All other components within normal limits  RESP PANEL BY RT-PCR (FLU A&B, COVID) [id number]  URINE CULTURE    EKG EKG Interpretation  Date/Time:  Sunday December 10 2020 15:05:11 EDT Ventricular Rate:  64 PR Interval:  202 QRS Duration: 102 QT Interval:  491 QTC Calculation: 507 R Axis:   2 Text Interpretation: Sinus rhythm RSR' in V1 or V2, right VCD or RVH Borderline T abnormalities, inferior leads Prolonged QT interval When compared to prior,longer QTc than prior. No STEMI Confirmed by Antony Blackbird [id number]) on 12/10/2020 3:40:22 PM   Radiology CT ABDOMEN PELVIS W CONTRAST  Result Date: 12/10/2020 CLINICAL DATA:  Bowel obstruction suspected fever, abd pain, distension, no flatus, constiaption. hx of hernia surgery. rule out obstruction EXAM: CT ABDOMEN AND PELVIS WITH CONTRAST TECHNIQUE: Multidetector CT imaging of the abdomen and pelvis was performed using the standard protocol following bolus administration of intravenous contrast. CONTRAST:  111mL OMNIPAQUE IOHEXOL 300 MG/ML  SOLN COMPARISON:  CT 07/18/2014 FINDINGS: Lower chest: Upper normal heart size with coronary artery calcifications. Heterogeneous basilar pulmonary parenchyma primarily dependently, favoring hypoventilatory atelectasis. Moderate-sized hiatal hernia. Hepatobiliary: Enhancing focus in the right lobe of the liver appears contiguous with the right hepatic vein and right portal vein suggesting AV malformation. Unchanged in size  and appearance from prior exam. Hyperenhancing lesion in the left lobe has Peri communication to the left hepatic and left portal veins, also likely vascular malformation. This is also unchanged. Scattered simple hepatic cysts. Gallbladder physiologically distended, no calcified stone. No biliary dilatation. Pancreas: Parenchymal atrophy. Mild dilatation of the proximal pancreatic duct at 5 mm. There is no obvious pancreatic mass or obstructing lesion. No peripancreatic fat stranding. Spleen: Normal in size without focal abnormality. Adrenals/Urinary Tract: Normal adrenal glands. No hydronephrosis or perinephric edema. Homogeneous renal enhancement with symmetric excretion on delayed phase imaging. No evidence of renal stone or focal renal lesion. Urinary bladder is partially distended, displaced anteriorly by stool ball distending the rectum. Stomach/Bowel: Moderate hiatal hernia. No abnormal gastric distension. Small bowel diffusely fluid-filled and prominent, but not overtly dilated. No small bowel transition point. Appendix is tentatively visualized and normal. Liquid stool/fluid-filled cecum and ascending colon. There is moderate volume of formed stool in the transverse and distal colon. Sigmoid colonic redundancy. There is a large stool ball distending the rectum, rectal distention of 9.8 cm. Minimal rectal wall thickening, series 2, image 57 with faint perirectal edema. No pneumatosis. Vascular/Lymphatic: Advanced aortic atherosclerosis. Aortic tortuosity. No aortic aneurysm. Retroaortic left renal vein. Patent portal vein. No abdominopelvic adenopathy. Reproductive: Prostate gland not well seen, may be atrophic or absent. Other: Prior right inguinal hernia repair without evidence of recurrent hernia. No ascites or Theard air. Musculoskeletal: Scoliosis and multilevel degenerative change in the spine. Moderate L1 compression fracture. Severe T10 compression fracture. Diffuse thoracolumbar degenerative change.  IMPRESSION: 1. Large stool ball distending the rectum, consistent with  fecal impaction. Minimal rectal wall thickening with faint perirectal edema. Moderate stool in the transverse and descending colon. 2. Fluid-filled and prominent small bowel without transition point, favoring ileus related to fecal impaction. 3. Prior right inguinal hernia repair without recurrent hernia. 4. Mild pancreatic parenchymal atrophy. Mild dilatation of the proximal pancreatic duct at 5 mm. No evidence of obstructing mass. 5. Moderate hiatal hernia. 6. Unchanged liver lesions, likely vascular malformations. These are stable from 2015. 7. Moderate L1 and severe T10 compression fractures, new from 2015, but age indeterminate. Aortic Atherosclerosis (ICD10-I70.0). Electronically Signed   By: Keith Rake M.D.   On: 12/10/2020 17:42   DG Chest Portable 1 View  Result Date: 12/10/2020 CLINICAL DATA:  Fever for 3 days. EXAM: PORTABLE CHEST 1 VIEW COMPARISON:  10/30/2017 FINDINGS: Mild cardiomegaly remains stable. Both lungs are clear. No evidence of pneumothorax or pleural effusion. IMPRESSION: Stable mild cardiomegaly. No active lung disease. Electronically Signed   By: Marlaine Hind M.D.   On: 12/10/2020 16:13    Procedures Procedures   Medications Ordered in ED Medications  sodium phosphate (FLEET) 7-19 GM/118ML enema 1 enema (1 enema Rectal Not Given 12/10/20 2151)  iohexol (OMNIPAQUE) 300 MG/ML solution 100 mL (100 mLs Intravenous Contrast Given 12/10/20 1655)  sodium chloride 0.9 % bolus 500 mL (0 mLs Intravenous Stopped 12/10/20 1903)    ED Course  I have reviewed the triage vital signs and the nursing notes.  Pertinent labs & imaging results that were available during my care of the patient were reviewed by me and considered in my medical decision making (see chart for details).    MDM Rules/Calculators/A&P                          Treylan Mcclintock. is a 75 y.o. male with a past medical history significant  for GERD, hypertension, prior right inguinal hernia status post repair years ago, osteoporosis, diverticulosis, Parkinson disease, and hyperlipidemia who presents with abdominal pain, nausea, decreased flatus, abdominal tension, and fever.  Patient reports that for the last 3 days, he has had worsening abdominal discomfort that he describes up to an 8 out of 10.  He reports it is a stretching and " tightness" type pain diffusely in his abdomen.  He reports that his abdomen has been swelling intermittently.  He reports he has not passed gas in several days and is not abdomen.  He is normally slightly more regular than this.  He reports no urinary changes.  He reports nausea no vomiting.  He reports he had fever for the last few days and was febrile on arrival.  He denies any congestion, chest pain, shortness breath, or cough.  He does report he had some back pain for the last 20 years and that does not feel any different.  He reports that he has not felt any bulge in his right inguinal area similar to prior hernia. he denies any other complaints today.  On exam, lungs clear and chest is nontender.  Abdomen is diffusely tender.  There were high-pitched tinkling bowel sounds concerning for possible obstruction.  No significant CVA or flank tenderness.  No back tenderness.  No focal neurologic deficits initially.  Intact pulses in lower extremities bilaterally.  He does have an abrasion/skin tear to his right shin which she reports was from a fall earlier.  He denies any change in his discomfort from the fall today.  Denies any head injury.  No  headache, neck pain, neck stiffness.  He reports loss of months he has had some mild diffuse rashes but it is unchanged now.  Clinically I am concerned need to rule out a bowel obstruction primarily.  As he had intact lower extremity pulses, denies any history of aortic disease, blood pressures not significant elevated, and his back pain he reports has been present for 20  years, low suspicion for aortic etiology at this time.  We will get a contrasted CT scan as well as other infectious work-up with urine and COVID test.  We will get a chest x-ray as well.  Anticipate reassessment for work-up to determine disposition.  Work-up is begun to return.  Flu and COVID test negative.  Urinalysis shows no infection but does show some ketones likely related to dehydration.  CBC reassuring and metabolic panel shows mild hypokalemia.  Lactic acid is elevated, suspect dehydration over sepsis at this time.  Patient given some fluids.  Lipase slightly elevated likely related to his slowed bowel transit due to fecal impaction seen on CT.  There is no evidence of obstruction but likely some mild ileus related to the fecal impaction.  Patient will have a manual disimpaction attempt at the bedside.  Patient had a successful fecal disimpaction at the bedside.  After disimpaction attempt, patient was able to have a large bowel movement after rehydration.  Work-up otherwise continued to improve with lactic acid improving after hydration.  He was able to have a good bowel movement.  Labs overall reassuring.  After good bowel movement and resolution of symptoms, feel he is safe for discharge home.  Patient will follow-up with PCP and start his MiraLAX daily for the next few days.  He will see his GI team and PCP and understood return precautions.  He had no other questions or concerns and was discharged in good condition.    Final Clinical Impression(s) / ED Diagnoses Final diagnoses:  Generalized abdominal pain  Fecal impaction ([facility])  Constipation, unspecified constipation type    Rx / DC Orders ED Discharge Orders    None     Clinical Impression: 1. Generalized abdominal pain   2. Fecal impaction ([place])   3. Constipation, unspecified constipation type     Disposition: Discharge  Condition: Good  I have discussed the results, Dx and Tx plan with the pt(& family if  present). He/she/they expressed understanding and agree(s) with the plan. Discharge instructions discussed at great length. Strict return precautions discussed and pt &/or family have verbalized understanding of the instructions. No further questions at time of discharge.    New Prescriptions   No medications on file    Follow Up: Eulas Post, MD [place] [address] [place] DEPT [address] [id number] [place]       Tylisa Alcivar, Gwenyth Allegra, MD 12/10/20 [phone]

## 2020-12-10 NOTE — ED Notes (Signed)
X RAY at bedside 

## 2020-12-10 NOTE — ED Notes (Signed)
Patient had a bowel movement. Linens were changed.

## 2020-12-10 NOTE — ED Notes (Signed)
Spoke to patients [name], I advised the patient the was discharged from the hospital. Also called [facility] to advise he was discharged from the hospital. The do not have weekend or night time transportation. Will call [facility]

## 2020-12-10 NOTE — ED Notes (Signed)
Patient transported to CT 

## 2020-12-10 NOTE — ED Notes (Signed)
1 set of blood cultures sent with ordered blood work.

## 2020-12-10 NOTE — ED Triage Notes (Signed)
Pt BIB [facility]  From [facility] [place] with c/o constipation x3 days and  fever ranging from 100-100.1 degrees. EMS reports pt has been taking stool softeners and laxatives past 3 days with no improvement. Ems gave 960 mg of tylenol.   Vitals 99.9 F 138/58 P-60 R-16 02-  96% room air.

## 2020-12-10 NOTE — ED Notes (Signed)
Transport from [facility] requested.

## 2020-12-11 DIAGNOSIS — K5900 Constipation, unspecified: Secondary | ICD-10-CM | POA: Diagnosis not present

## 2020-12-11 DIAGNOSIS — Z743 Need for continuous supervision: Secondary | ICD-10-CM | POA: Diagnosis not present

## 2020-12-11 DIAGNOSIS — R279 Unspecified lack of coordination: Secondary | ICD-10-CM | POA: Diagnosis not present

## 2020-12-11 NOTE — ED Notes (Signed)
Patient had another bowel movement .

## 2020-12-12 LAB — URINE CULTURE: Culture: <10000 — AB

## 2020-12-15 DIAGNOSIS — Z20822 Contact with and (suspected) exposure to covid-19: Secondary | ICD-10-CM | POA: Diagnosis not present

## 2020-12-19 DIAGNOSIS — H2513 Age-related nuclear cataract, bilateral: Secondary | ICD-10-CM | POA: Diagnosis not present

## 2020-12-19 DIAGNOSIS — H25013 Cortical age-related cataract, bilateral: Secondary | ICD-10-CM | POA: Diagnosis not present

## 2020-12-19 DIAGNOSIS — H18413 Arcus senilis, bilateral: Secondary | ICD-10-CM | POA: Diagnosis not present

## 2020-12-19 DIAGNOSIS — H25043 Posterior subcapsular polar age-related cataract, bilateral: Secondary | ICD-10-CM | POA: Diagnosis not present

## 2020-12-19 DIAGNOSIS — H2511 Age-related nuclear cataract, right eye: Secondary | ICD-10-CM | POA: Diagnosis not present

## 2020-12-21 DIAGNOSIS — Z20822 Contact with and (suspected) exposure to covid-19: Secondary | ICD-10-CM | POA: Diagnosis not present

## 2021-01-13 ENCOUNTER — Telehealth: Payer: Self-pay | Admitting: Family Medicine

## 2021-01-13 ENCOUNTER — Encounter: Payer: Self-pay | Admitting: Family Medicine

## 2021-01-13 NOTE — Telephone Encounter (Signed)
 on call  Patient with dementia but has complained of burning with peeing at his facility. They state he is not sexually active. No systemic symptoms at present. They request UA and culture orders- I gave verbal ok. I have not received results- these are going to [facility] apparently and may not come back until early next week. If he has new or worsening symptoms advised they send him to urgent care or ED  Garret Reddish, MD

## 2021-01-17 ENCOUNTER — Other Ambulatory Visit: Payer: Self-pay

## 2021-01-19 ENCOUNTER — Ambulatory Visit (INDEPENDENT_AMBULATORY_CARE_PROVIDER_SITE_OTHER): Payer: PPO | Admitting: Family Medicine

## 2021-01-19 ENCOUNTER — Other Ambulatory Visit: Payer: Self-pay

## 2021-01-19 ENCOUNTER — Encounter: Payer: Self-pay | Admitting: Family Medicine

## 2021-01-19 VITALS — BP 140/62 | HR 53 | Temp 98.3°F | Wt 145.2 lb

## 2021-01-19 DIAGNOSIS — R198 Other specified symptoms and signs involving the digestive system and abdomen: Secondary | ICD-10-CM

## 2021-01-19 DIAGNOSIS — I10 Essential (primary) hypertension: Secondary | ICD-10-CM | POA: Diagnosis not present

## 2021-01-19 DIAGNOSIS — K5900 Constipation, unspecified: Secondary | ICD-10-CM

## 2021-01-19 NOTE — Progress Notes (Signed)
Established Patient Office Visit  Subjective:  Patient ID: [name] Brooking., male    DOB: Apr 23, 1946  Age: 75 y.o. MRN: [id number]  CC:  Chief Complaint  Patient presents with  . Constipation    HPI [name] Shankland. presents for GI issues.  He has had some constipation issues and went to the hospital back in late April with severe constipation.  He has had at the other extreme occasional loose stools.  He had been taking at 1 point MiraLAX daily and this was decreased every 3 days which he currently takes.  His son states that he is not getting enough fiber he thinks and probably not getting enough fluids either.  His urine tends to be very dark.  He seems to have some abdominal prominence on the right side but not complaining of any abdominal pain.  Sometimes has small caliber stools and sometimes has to strain but not consistently or daily. We mentioned he may have some leakage around hard stool at times.  He has Parkinson's disease and is on Sinemet which is likely contributing to his constipation along with decreased fluid intake and decreased physical activity.  Hypertension treated with Amlodipine and Losartan.  No recent orthostatic symptoms.   Has has some orthostasis in past.   Past Medical History:  Diagnosis Date  . Abdominal pain, right lower quadrant   . Abnormality of gait 05/16/2016  . Acute prostatitis   . Anxiety   . Constipation   . Degenerative disc disease   . Depression   . Diverticulosis of colon (without mention of hemorrhage)   . Essential and other specified forms of tremor   . Family history of malignant neoplasm of gastrointestinal tract   . Fibromyalgia   . Ganglion and cyst of synovium, tendon, and bursa   . GERD (gastroesophageal reflux disease)   . Hemarthrosis, upper arm   . Hyperlipidemia   . Hypersomnia with sleep apnea, unspecified   . Hypertension   . Hypopotassemia   . Localized osteoarthrosis not specified whether primary or secondary,  lower leg   . Lumbago   . Memory difficulties 04/22/2013  . Nocturia   . Osteoporosis, unspecified   . Other testicular hypofunction   . Parkinson disease ([place]) 05/16/2016  . Right inguinal hernia   . Sleep apnea    last sleep study 11/11 on chart- Bipap with settings of 4 per  patient  . Spinal stenosis, lumbar region, without neurogenic claudication   . Syncope and collapse   . Unspecified adverse effect of unspecified drug, medicinal and biological substance   . Unspecified gastritis and gastroduodenitis without mention of hemorrhage   . Urinary frequency     Past Surgical History:  Procedure Laterality Date  . BACK SURGERY     x 3  . [place]  . INGUINAL HERNIA REPAIR  07/19/2011   Procedure: LAPAROSCOPIC INGUINAL HERNIA;  Surgeon: Odis Hollingshead, MD;  Location: [facility];  Service: General;  Laterality: Right;  Laparoscopic Repair of Recurrent Right  Ingunial Hernia with Mesh  . microdisectomy  03/02/2003, 06/29/2003, 12/24/2005  . TONSILLECTOMY      Family History  Problem Relation Age of Onset  . Colon cancer Mother   . Dementia Mother   . Parkinsonism Father 54  . Lung cancer Paternal Grandfather        smoker  . Liver disease Neg Hx   . Kidney disease Neg Hx   . Esophageal cancer Neg  Hx     Social History   Socioeconomic History  . Marital status: Widowed    Spouse name: Not on file  . Number of children: 2  . Years of education: college  . Highest education level: Not on file  Occupational History  . Occupation: retired    Fish farm manager: RETIRED  Tobacco Use  . Smoking status: Never Smoker  . Smokeless tobacco: Never Used  Vaping Use  . Vaping Use: Never used  Substance and Sexual Activity  . Alcohol use: No    Alcohol/week: 0.0 standard drinks    Comment: less than 5 per week  . Drug use: No  . Sexual activity: Not on file  Other Topics Concern  . Not on file  Social History Narrative   Lives at [place], [place]   Patient is  right handed.   Patient drinks 1 cup of caffeine per day.   Social Determinants of Health   Financial Resource Strain: Low Risk   . Difficulty of Paying Living Expenses: Not hard at all  Food Insecurity: No Food Insecurity  . Worried About Charity fundraiser in the Last Year: Never true  . Ran Out of Food in the Last Year: Never true  Transportation Needs: No Transportation Needs  . Lack of Transportation (Medical): No  . Lack of Transportation (Non-Medical): No  Physical Activity: Inactive  . Days of Exercise per Week: 0 days  . Minutes of Exercise per Session: 0 min  Stress: No Stress Concern Present  . Feeling of Stress : Only a little  Social Connections: Moderately Isolated  . Frequency of Communication with Friends and Family: More than three times a week  . Frequency of Social Gatherings with Friends and Family: Once a week  . Attends Religious Services: More than 4 times per year  . Active Member of Clubs or Organizations: No  . Attends Archivist Meetings: Never  . Marital Status: Widowed  Intimate Partner Violence: Not At Risk  . Fear of Current or Ex-Partner: No  . Emotionally Abused: No  . Physically Abused: No  . Sexually Abused: No    Outpatient Medications Prior to Visit  Medication Sig Dispense Refill  . amLODipine (NORVASC) 10 MG tablet Take 1 tablet (10 mg total) by mouth daily. 90 tablet 3  . Calcium Carbonate-Vitamin D (CALCIUM 600+D) 600-400 MG-UNIT tablet Take 1 tablet by mouth daily. 90 tablet 3  . carbidopa-levodopa (SINEMET IR) 25-100 MG tablet Take 1 tablet by mouth 3 (three) times daily. 90 tablet 5  . Cholecalciferol (VITAMIN D3) 125 MCG (5000 UT) CAPS Take 1 capsule by mouth daily.    . citalopram (CELEXA) 40 MG tablet Take 1 tablet (40 mg total) by mouth daily. 90 tablet 1  . entacapone (COMTAN) 200 MG tablet TAKE (1) TABLET BY MOUTH (3) TIMES DAILY. 90 tablet 0  . famotidine (PEPCID) 20 MG tablet Take 1 tablet (20 mg total) by mouth 2  (two) times daily. 60 tablet 3  . KLOR-CON M20 20 MEQ tablet TAKE 3 TABLETS (60 MEQ TOTAL) BY MOUTH DAILY. 90 tablet 1  . Krill Oil 1000 MG CAPS Take 1 capsule by mouth 2 (two) times daily.    Marland Kitchen losartan (COZAAR) 100 MG tablet TAKE 1 TABLET (100 MG TOTAL) BY MOUTH DAILY. 90 tablet 2  . MAGNESIUM CITRATE PO Take 250 mg by mouth 2 (two) times daily.    . memantine (NAMENDA) 10 MG tablet TAKE 1 TABLET (10 MG TOTAL) BY  MOUTH 2 (TWO) TIMES DAILY. 180 tablet 3  . NUPLAZID 34 MG CAPS TAKE (1) CAPSULE BY MOUTH ONCE DAILY. 90 capsule 1  . polyethylene glycol (MIRALAX / GLYCOLAX) 17 g packet Take 17 g by mouth daily. 14 each 3  . Probiotic Product (ALIGN) 4 MG CAPS Take by mouth daily.    . QUEtiapine (SEROQUEL) 25 MG tablet 1 tablet at 2 pm 30 tablet 2  . trimethoprim-polymyxin b (POLYTRIM) ophthalmic solution Place 2 drops into both eyes every 4 (four) hours. Placed 2 drops into both eyes every 4 hours while awake for 7 days 10 mL 0  . vitamin B-12 (CYANOCOBALAMIN) 50 MCG tablet Take 50 mcg by mouth daily.     No facility-administered medications prior to visit.    Allergies  Allergen Reactions  . Nucynta [Tapentadol] Other (See Comments)    dizziness  . Pregabalin     REACTION: dizziness and mental status changes  . Exelon [Rivastigmine]     abd pain    ROS Review of Systems  Constitutional: Negative for appetite change, chills, fever and unexpected weight change.  Respiratory: Negative for shortness of breath.   Cardiovascular: Negative for chest pain.  Gastrointestinal: Positive for constipation. Negative for abdominal pain, blood in stool, nausea and vomiting.      Objective:    Physical Exam Vitals reviewed.  Constitutional:      Appearance: Normal appearance.  Cardiovascular:     Rate and Rhythm: Normal rate and regular rhythm.  Pulmonary:     Effort: Pulmonary effort is normal.     Breath sounds: Normal breath sounds.  Abdominal:     Comments: Abdominal exam does reveal  some prominence on the right side but he has scoliosis and difficult to determine if a lot of this is asymmetry related to his scoliosis.  Cannot rule out possible hernia.  His abdomen is nontender.  No firm masses palpated.  No guarding or rebound.  No hepatomegaly.  Neurological:     Mental Status: He is alert.     BP 140/62 (BP Location: Left Arm, Patient Position: Sitting, Cuff Size: Normal)   Pulse (!) 53   Temp 98.3 F (36.8 C) (Oral)   Wt 145 lb 3.2 oz (65.9 kg)   SpO2 98%   BMI 21.44 kg/m  Wt Readings from Last 3 Encounters:  01/19/21 145 lb 3.2 oz (65.9 kg)  12/10/20 143 lb 15.4 oz (65.3 kg)  11/06/20 144 lb (65.3 kg)     Health Maintenance Due  Topic Date Due  . Pneumococcal Vaccine 21-74 Years old (1 of 4 - PCV13) Never done  . Zoster Vaccines- Shingrix (1 of 2) Never done  . COLONOSCOPY (Pts 45-73yrs Insurance coverage will need to be confirmed)  02/03/2016  . COVID-19 Vaccine (3 - Moderna risk 4-dose series) 10/30/2019    There are no preventive care reminders to display for this patient.  Lab Results  Component Value Date   TSH 2.81 11/06/2020   Lab Results  Component Value Date   WBC 7.8 12/10/2020   HGB 13.1 12/10/2020   HCT 38.5 (L) 12/10/2020   MCV 95.8 12/10/2020   PLT 158 12/10/2020   Lab Results  Component Value Date   NA 139 12/10/2020   K 3.3 (L) 12/10/2020   CO2 28 12/10/2020   GLUCOSE 127 (H) 12/10/2020   BUN 25 (H) 12/10/2020   CREATININE 1.18 12/10/2020   BILITOT 0.8 12/10/2020   ALKPHOS 104 12/10/2020   AST 23 12/10/2020  ALT 10 12/10/2020   PROT 7.2 12/10/2020   ALBUMIN 4.0 12/10/2020   CALCIUM 8.9 12/10/2020   ANIONGAP 10 12/10/2020   GFR 57.88 (L) 11/06/2020   Lab Results  Component Value Date   CHOL 203 (H) 03/02/2014   Lab Results  Component Value Date   HDL 76.90 03/02/2014   Lab Results  Component Value Date   LDLCALC 118 (H) 03/02/2014   Lab Results  Component Value Date   TRIG 41.0 03/02/2014   Lab  Results  Component Value Date   CHOLHDL 3 03/02/2014   Lab Results  Component Value Date   HGBA1C 5.9 01/23/2010      Assessment & Plan:   #1 intermittent constipation.  Exacerbated by several factors including decreased activity levels, anticholinergic medications, decreased fiber intake, decreased fluid intake  -Discussed conservative factors with increasing fluid intake and increasing dietary fibers with goal of 25 to 30 g/day.  We discussed the fact that we could add fiber supplement such as Citrucel, FiberCon, or Metamucil but if not taking adequate fluids this could cause even worse constipation. -Continue MiraLAX every 3 days currently -We have written orders to try to work on him getting 8 ounces of water 4 times daily  #2 abdominal asymmetry right greater than left.  Suspect largely this is due to his scoliosis.  Cannot rule out hernia but he has no worrisome symptoms such as pain and we recommend observation for now.  No mass palpated.  #3 hypertension- stable Continue current dose of Amlodipine and Losartan.  No orders of the defined types were placed in this encounter.   Follow-up: No follow-ups on file.    Carolann Littler, MD

## 2021-01-25 ENCOUNTER — Encounter: Payer: Self-pay | Admitting: Neurology

## 2021-01-25 ENCOUNTER — Ambulatory Visit: Payer: PPO | Admitting: Neurology

## 2021-01-25 VITALS — BP 178/76 | HR 66 | Ht 71.0 in | Wt 166.0 lb

## 2021-01-25 DIAGNOSIS — G20 Parkinson's disease: Secondary | ICD-10-CM | POA: Diagnosis not present

## 2021-01-25 DIAGNOSIS — R413 Other amnesia: Secondary | ICD-10-CM | POA: Diagnosis not present

## 2021-01-25 NOTE — Progress Notes (Signed)
Reason for visit: Parkinson's disease, dementia, gait disorder  [name]. is an 75 y.o. male  History of present illness:  [name] is a 75 year old right-handed white male with a history of Parkinson's disease with associated dementia.  He has had a lot of hallucinations and delusional thinking that create anxiety for him.  He is on a relatively high dose of Celexa, he remains on Nuplazid and Seroquel without full control of his hallucinations and delusions.  He walks with a walker, he may fall on occasion.  He currently is residing at [facility].  He is having some frequent urinary and fecal incontinence, he may go several days without having a bowel movement have significant constipation or even impaction issues.  The patient comes in today with his son.  The patient oftentimes sleeps off and on throughout the day, but they are waking him up every 2 hours at night to check for incontinence issues.  Past Medical History:  Diagnosis Date   Abdominal pain, right lower quadrant    Abnormality of gait 05/16/2016   Acute prostatitis    Anxiety    Constipation    Degenerative disc disease    Depression    Diverticulosis of colon (without mention of hemorrhage)    Essential and other specified forms of tremor    Family history of malignant neoplasm of gastrointestinal tract    Fibromyalgia    Ganglion and cyst of synovium, tendon, and bursa    GERD (gastroesophageal reflux disease)    Hemarthrosis, upper arm    Hyperlipidemia    Hypersomnia with sleep apnea, unspecified    Hypertension    Hypopotassemia    Localized osteoarthrosis not specified whether primary or secondary, lower leg    Lumbago    Memory difficulties 04/22/2013   Nocturia    Osteoporosis, unspecified    Other testicular hypofunction    Parkinson disease ([place]) 05/16/2016   Right inguinal hernia    Sleep apnea    last sleep study 11/11 on chart- Bipap with settings of 4 per  patient   Spinal  stenosis, lumbar region, without neurogenic claudication    Syncope and collapse    Unspecified adverse effect of unspecified drug, medicinal and biological substance    Unspecified gastritis and gastroduodenitis without mention of hemorrhage    Urinary frequency     Past Surgical History:  Procedure Laterality Date   BACK SURGERY     x 3   HERNIA REPAIR  1965, 1968   INGUINAL HERNIA REPAIR  07/19/2011   Procedure: LAPAROSCOPIC INGUINAL HERNIA;  Surgeon: Odis Hollingshead, MD;  Location: [facility];  Service: General;  Laterality: Right;  Laparoscopic Repair of Recurrent Right  Ingunial Hernia with Mesh   microdisectomy  03/02/2003, 06/29/2003, 12/24/2005   TONSILLECTOMY      Family History  Problem Relation Age of Onset   Colon cancer Mother    Dementia Mother    Parkinsonism Father 4   Lung cancer Paternal Grandfather        smoker   Liver disease Neg Hx    Kidney disease Neg Hx    Esophageal cancer Neg Hx     Social history:  reports that he has never smoked. He has never used smokeless tobacco. He reports that he does not drink alcohol and does not use drugs.    Allergies  Allergen Reactions   Nucynta [Tapentadol] Other (See Comments)    dizziness   Pregabalin  REACTION: dizziness and mental status changes   Exelon [Rivastigmine]     abd pain    Medications:  Prior to Admission medications   Medication Sig Start Date End Date Taking? Authorizing Provider  amLODipine (NORVASC) 10 MG tablet Take 1 tablet (10 mg total) by mouth daily. 08/10/19  Yes Burchette, Alinda Sierras, MD  Calcium Carbonate-Vitamin D (CALCIUM 600+D) 600-400 MG-UNIT tablet Take 1 tablet by mouth daily. 04/10/18  Yes Burchette, Alinda Sierras, MD  carbidopa-levodopa (SINEMET IR) 25-100 MG tablet Take 1 tablet by mouth 3 (three) times daily. 09/20/20  Yes Kathrynn Ducking, MD  Cholecalciferol (VITAMIN D3) 125 MCG (5000 UT) CAPS Take 1 capsule by mouth daily. 08/23/19  Yes [provider]  citalopram (CELEXA)  40 MG tablet Take 1 tablet (40 mg total) by mouth daily. 11/07/20  Yes Kathrynn Ducking, MD  entacapone (COMTAN) 200 MG tablet TAKE (1) TABLET BY MOUTH (3) TIMES DAILY. 11/13/18  Yes Kathrynn Ducking, MD  famotidine (PEPCID) 20 MG tablet Take 1 tablet (20 mg total) by mouth 2 (two) times daily. 12/14/18  Yes Burchette, Alinda Sierras, MD  KLOR-CON M20 20 MEQ tablet TAKE 3 TABLETS (60 MEQ TOTAL) BY MOUTH DAILY. 01/02/18  Yes Burchette, Alinda Sierras, MD  Krill Oil 1000 MG CAPS Take 1 capsule by mouth 2 (two) times daily.   Yes [provider]  losartan (COZAAR) 100 MG tablet TAKE 1 TABLET (100 MG TOTAL) BY MOUTH DAILY. 09/16/17  Yes Burchette, Alinda Sierras, MD  memantine (NAMENDA) 10 MG tablet TAKE 1 TABLET (10 MG TOTAL) BY MOUTH 2 (TWO) TIMES DAILY. 12/08/17  Yes Kathrynn Ducking, MD  NUPLAZID 34 MG CAPS TAKE (1) CAPSULE BY MOUTH ONCE DAILY. 11/21/20  Yes Kathrynn Ducking, MD  polyethylene glycol (MIRALAX / GLYCOLAX) 17 g packet Take 17 g by mouth daily. 05/16/20  Yes Kathrynn Ducking, MD  Probiotic Product (ALIGN) 4 MG CAPS Take by mouth daily.   Yes [provider]  QUEtiapine (SEROQUEL) 25 MG tablet 1 tablet at 2 pm 11/07/20  Yes Kathrynn Ducking, MD  trimethoprim-polymyxin b (POLYTRIM) ophthalmic solution Place 2 drops into both eyes every 4 (four) hours. Placed 2 drops into both eyes every 4 hours while awake for 7 days 08/15/20  Yes Burchette, Alinda Sierras, MD  vitamin B-12 (CYANOCOBALAMIN) 50 MCG tablet Take 50 mcg by mouth daily.   Yes [provider]  MAGNESIUM CITRATE PO Take 250 mg by mouth 2 (two) times daily.    [provider]    ROS:  Out of a complete 14 system review of symptoms, the patient complains only of the following symptoms, and all other reviewed systems are negative.  Hallucinations, confusion Walking difficulty Memory problems   Blood pressure (!) 178/76, pulse 66, height 5\' 11"  (1.803 m), weight 166 lb (75.3 kg).  Physical Exam  General: The patient  is alert and cooperative at the time of the examination.  Skin: No significant peripheral edema is noted.   Neurologic Exam  Mental status: The patient is alert and oriented x 3 at the time of the examination. The Mini-Mental status examination done today shows a total score 16/30.   Cranial nerves: Facial symmetry is present. Speech is normal, no aphasia or dysarthria is noted. Extraocular movements are full. Visual fields are full.  Masking of the face is seen.  Motor: The patient has good strength in all 4 extremities, with exception of some weakness with hip flexion bilaterally.  Sensory  examination: Soft touch sensation is symmetric on the face, arms, and legs.  Coordination: The patient has good finger-nose-finger, but has difficulty with heel-to-shin bilaterally.  The patient has some apraxia with using the arms and legs.  Gait and station: The patient requires assistance with standing, once up, he has a stooped posture, leans to the right slightly, he is able to ambulate with fairly good stride and good turns with a walker.  He cannot walk independently.  Reflexes: Deep tendon reflexes are symmetric.   Assessment/Plan:  1.  Parkinson's disease  2.  Gait disorder  3.  Memory disorder  4.  Hallucinations, delusional thinking  The patient is on a very low-dose of Sinemet along with Comtan.  I would not alter the medication regimen today.  The patient is having some anxiety associated with his delusional thinking, the Nuplazid and Seroquel have not completely controlled this.  The patient may be given Senokot if needed if he gets constipated.  I do not have a lot to add currently to his current care.  He will follow-up here in 5 months, in the future he may be seen through Dr. Rexene Alberts.  Jill Alexanders MD 01/25/2021 4:05 PM  [facility] [address] [place], [place] [address]  Phone [phone] Fax [phone]

## 2021-02-12 DIAGNOSIS — H2511 Age-related nuclear cataract, right eye: Secondary | ICD-10-CM | POA: Diagnosis not present

## 2021-02-13 DIAGNOSIS — H2512 Age-related nuclear cataract, left eye: Secondary | ICD-10-CM | POA: Diagnosis not present

## 2021-03-02 ENCOUNTER — Other Ambulatory Visit: Payer: Self-pay | Admitting: Neurology

## 2021-03-05 DIAGNOSIS — H2512 Age-related nuclear cataract, left eye: Secondary | ICD-10-CM | POA: Diagnosis not present

## 2021-05-02 ENCOUNTER — Other Ambulatory Visit: Payer: Self-pay | Admitting: Family Medicine

## 2021-05-02 NOTE — Telephone Encounter (Signed)
Please advise. I do not see this on the current med list.

## 2021-05-17 ENCOUNTER — Ambulatory Visit (INDEPENDENT_AMBULATORY_CARE_PROVIDER_SITE_OTHER): Payer: PPO

## 2021-05-17 DIAGNOSIS — Z0000 Encounter for general adult medical examination without abnormal findings: Secondary | ICD-10-CM | POA: Diagnosis not present

## 2021-05-17 NOTE — Patient Instructions (Signed)
[name] , Thank you for taking time to come for your Medicare Wellness Visit. I appreciate your ongoing commitment to your health goals. Please review the following plan we discussed and let me know if I can assist you in the future.   Screening recommendations/referrals: Colonoscopy: no longer required  Recommended yearly ophthalmology/optometry visit for glaucoma screening and checkup Recommended yearly dental visit for hygiene and checkup  Vaccinations: Influenza vaccine: due in fall 2022  Pneumococcal vaccine: completed series  Tdap vaccine:03/07/2014 Shingles vaccine: will consider     Advanced directives: will provide copies   Conditions/risks identified: none   Next appointment: none   Preventive Care 39 Years and Older, Male Preventive care refers to lifestyle choices and visits with your health care provider that can promote health and wellness. What does preventive care include? A yearly physical exam. This is also called an annual well check. Dental exams once or twice a year. Routine eye exams. Ask your health care provider how often you should have your eyes checked. Personal lifestyle choices, including: Daily care of your teeth and gums. Regular physical activity. Eating a healthy diet. Avoiding tobacco and drug use. Limiting alcohol use. Practicing safe sex. Taking low doses of aspirin every day. Taking vitamin and mineral supplements as recommended by your health care provider. What happens during an annual well check? The services and screenings done by your health care provider during your annual well check will depend on your age, overall health, lifestyle risk factors, and family history of disease. Counseling  Your health care provider may ask you questions about your: Alcohol use. Tobacco use. Drug use. Emotional well-being. Home and relationship well-being. Sexual activity. Eating habits. History of falls. Memory and ability to understand  (cognition). Work and work Statistician. Screening  You may have the following tests or measurements: Height, weight, and BMI. Blood pressure. Lipid and cholesterol levels. These may be checked every 5 years, or more frequently if you are over 53 years old. Skin check. Lung cancer screening. You may have this screening every year starting at age 12 if you have a 30-pack-year history of smoking and currently smoke or have quit within the past 15 years. Fecal occult blood test (FOBT) of the stool. You may have this test every year starting at age 37. Flexible sigmoidoscopy or colonoscopy. You may have a sigmoidoscopy every 5 years or a colonoscopy every 10 years starting at age 47. Prostate cancer screening. Recommendations will vary depending on your family history and other risks. Hepatitis C blood test. Hepatitis B blood test. Sexually transmitted disease (STD) testing. Diabetes screening. This is done by checking your blood sugar (glucose) after you have not eaten for a while (fasting). You may have this done every 1-3 years. Abdominal aortic aneurysm (AAA) screening. You may need this if you are a current or former smoker. Osteoporosis. You may be screened starting at age 46 if you are at high risk. Talk with your health care provider about your test results, treatment options, and if necessary, the need for more tests. Vaccines  Your health care provider may recommend certain vaccines, such as: Influenza vaccine. This is recommended every year. Tetanus, diphtheria, and acellular pertussis (Tdap, Td) vaccine. You may need a Td booster every 10 years. Zoster vaccine. You may need this after age 30. Pneumococcal 13-valent conjugate (PCV13) vaccine. One dose is recommended after age 64. Pneumococcal polysaccharide (PPSV23) vaccine. One dose is recommended after age 34. Talk to your health care provider about which screenings  and vaccines you need and how often you need them. This  information is not intended to replace advice given to you by your health care provider. Make sure you discuss any questions you have with your health care provider. Document Released: 09/01/2015 Document Revised: 04/24/2016 Document Reviewed: 06/06/2015 Elsevier Interactive Patient Education  2017 [place] Prevention in the Home Falls can cause injuries. They can happen to people of all ages. There are many things you can do to make your home safe and to help prevent falls. What can I do on the outside of my home? Regularly fix the edges of walkways and driveways and fix any cracks. Remove anything that might make you trip as you walk through a door, such as a raised step or threshold. Trim any bushes or trees on the path to your home. Use bright outdoor lighting. Clear any walking paths of anything that might make someone trip, such as rocks or tools. Regularly check to see if handrails are loose or broken. Make sure that both sides of any steps have handrails. Any raised decks and porches should have guardrails on the edges. Have any leaves, snow, or ice cleared regularly. Use sand or salt on walking paths during winter. Clean up any spills in your garage right away. This includes oil or grease spills. What can I do in the bathroom? Use night lights. Install grab bars by the toilet and in the tub and shower. Do not use towel bars as grab bars. Use non-skid mats or decals in the tub or shower. If you need to sit down in the shower, use a plastic, non-slip stool. Keep the floor dry. Clean up any water that spills on the floor as soon as it happens. Remove soap buildup in the tub or shower regularly. Attach bath mats securely with double-sided non-slip rug tape. Do not have throw rugs and other things on the floor that can make you trip. What can I do in the bedroom? Use night lights. Make sure that you have a light by your bed that is easy to reach. Do not use any sheets or  blankets that are too big for your bed. They should not hang down onto the floor. Have a firm chair that has side arms. You can use this for support while you get dressed. Do not have throw rugs and other things on the floor that can make you trip. What can I do in the kitchen? Clean up any spills right away. Avoid walking on wet floors. Keep items that you use a lot in easy-to-reach places. If you need to reach something above you, use a strong step stool that has a grab bar. Keep electrical cords out of the way. Do not use floor polish or wax that makes floors slippery. If you must use wax, use non-skid floor wax. Do not have throw rugs and other things on the floor that can make you trip. What can I do with my stairs? Do not leave any items on the stairs. Make sure that there are handrails on both sides of the stairs and use them. Fix handrails that are broken or loose. Make sure that handrails are as long as the stairways. Check any carpeting to make sure that it is firmly attached to the stairs. Fix any carpet that is loose or worn. Avoid having throw rugs at the top or bottom of the stairs. If you do have throw rugs, attach them to the floor with carpet tape.  Make sure that you have a light switch at the top of the stairs and the bottom of the stairs. If you do not have them, ask someone to add them for you. What else can I do to help prevent falls? Wear shoes that: Do not have high heels. Have rubber bottoms. Are comfortable and fit you well. Are closed at the toe. Do not wear sandals. If you use a stepladder: Make sure that it is fully opened. Do not climb a closed stepladder. Make sure that both sides of the stepladder are locked into place. Ask someone to hold it for you, if possible. Clearly mark and make sure that you can see: Any grab bars or handrails. First and last steps. Where the edge of each step is. Use tools that help you move around (mobility aids) if they are  needed. These include: Canes. Walkers. Scooters. Crutches. Turn on the lights when you go into a dark area. Replace any light bulbs as soon as they burn out. Set up your furniture so you have a clear path. Avoid moving your furniture around. If any of your floors are uneven, fix them. If there are any pets around you, be aware of where they are. Review your medicines with your doctor. Some medicines can make you feel dizzy. This can increase your chance of falling. Ask your doctor what other things that you can do to help prevent falls. This information is not intended to replace advice given to you by your health care provider. Make sure you discuss any questions you have with your health care provider. Document Released: 06/01/2009 Document Revised: 01/11/2016 Document Reviewed: 09/09/2014 Elsevier Interactive Patient Education  2017 [employer].

## 2021-05-17 NOTE — Progress Notes (Signed)
Subjective:   [name]. is a 75 y.o. male who presents for an Subsequent Medicare Annual Wellness Visit.  I connected with Hildred Alamin today by telephone and verified that I am speaking with the correct person using two identifiers. Location patient: home Location provider: work Persons participating in the virtual visit: patient, provider.   I discussed the limitations, risks, security and privacy concerns of performing an evaluation and management service by telephone and the availability of in person appointments. I also discussed with the patient that there may be a patient responsible charge related to this service. The patient expressed understanding and verbally consented to this telephonic visit.    Interactive audio and video telecommunications were attempted between this provider and patient, however failed, due to patient having technical difficulties OR patient did not have access to video capability.  We continued and completed visit with audio only.    Review of Systems    N/a Cardiac Risk Factors include: advanced age (>61men, >11 women);dyslipidemia;male gender;hypertension     Objective:    Today's Vitals   There is no height or weight on file to calculate BMI.  Advanced Directives 05/17/2021 12/10/2020 06/22/2020 03/31/2018 03/28/2017 08/27/2016 05/11/2015  Does Patient Have a Medical Advance Directive? Yes Yes Yes Yes Yes Yes Yes  Type of Paramedic of [place];Living will [place];Living will [place];Living will - - [place];Living will [place];Living will  Does patient want to make changes to medical advance directive? - - No - Patient declined - - - -  Copy of [place] in Chart? No - copy requested - No - copy requested - - Yes No - copy requested  Would patient like information on creating a medical advance directive? - No - Patient  declined - - - - -    Current Medications (verified) Outpatient Encounter Medications as of 05/17/2021  Medication Sig   amLODipine (NORVASC) 10 MG tablet Take 1 tablet (10 mg total) by mouth daily.   Calcium Carbonate-Vitamin D (CALCIUM 600+D) 600-400 MG-UNIT tablet Take 1 tablet by mouth daily.   CALMOSEPTINE 0.44-20.6 % OINT APPLY TOPICALLY TO BUTTOCKS AND PERINEUM AFTER EACH INCONTINENCE EPISODE AND AFTER SHOWERS.   carbidopa-levodopa (SINEMET IR) 25-100 MG tablet Take 1 tablet by mouth 3 (three) times daily.   Cholecalciferol (VITAMIN D3) 125 MCG (5000 UT) CAPS Take 1 capsule by mouth daily.   citalopram (CELEXA) 40 MG tablet Take 1 tablet (40 mg total) by mouth daily.   entacapone (COMTAN) 200 MG tablet TAKE (1) TABLET BY MOUTH (3) TIMES DAILY.   famotidine (PEPCID) 20 MG tablet Take 1 tablet (20 mg total) by mouth 2 (two) times daily.   KLOR-CON M20 20 MEQ tablet TAKE 3 TABLETS (60 MEQ TOTAL) BY MOUTH DAILY.   Krill Oil 1000 MG CAPS Take 1 capsule by mouth 2 (two) times daily.   losartan (COZAAR) 100 MG tablet TAKE 1 TABLET (100 MG TOTAL) BY MOUTH DAILY.   memantine (NAMENDA) 10 MG tablet TAKE 1 TABLET (10 MG TOTAL) BY MOUTH 2 (TWO) TIMES DAILY.   NUPLAZID 34 MG CAPS TAKE (1) CAPSULE BY MOUTH ONCE DAILY.   polyethylene glycol (MIRALAX / GLYCOLAX) 17 g packet Take 17 g by mouth daily.   Probiotic Product (ALIGN) 4 MG CAPS Take by mouth daily.   QUEtiapine (SEROQUEL) 25 MG tablet 1 tablet at 2 pm   trimethoprim-polymyxin b (POLYTRIM) ophthalmic solution Place  2 drops into both eyes every 4 (four) hours. Placed 2 drops into both eyes every 4 hours while awake for 7 days   vitamin B-12 (CYANOCOBALAMIN) 50 MCG tablet Take 50 mcg by mouth daily.   No facility-administered encounter medications on file as of 05/17/2021.    Allergies (verified) Nucynta [tapentadol], Pregabalin, and Exelon [rivastigmine]   History: Past Medical History:  Diagnosis Date   Abdominal pain, right lower  quadrant    Abnormality of gait 05/16/2016   Acute prostatitis    Anxiety    Constipation    Degenerative disc disease    Depression    Diverticulosis of colon (without mention of hemorrhage)    Essential and other specified forms of tremor    Family history of malignant neoplasm of gastrointestinal tract    Fibromyalgia    Ganglion and cyst of synovium, tendon, and bursa    GERD (gastroesophageal reflux disease)    Hemarthrosis, upper arm    Hyperlipidemia    Hypersomnia with sleep apnea, unspecified    Hypertension    Hypopotassemia    Localized osteoarthrosis not specified whether primary or secondary, lower leg    Lumbago    Memory difficulties 04/22/2013   Nocturia    Osteoporosis, unspecified    Other testicular hypofunction    Parkinson disease ([place]) 05/16/2016   Right inguinal hernia    Sleep apnea    last sleep study 11/11 on chart- Bipap with settings of 4 per  patient   Spinal stenosis, lumbar region, without neurogenic claudication    Syncope and collapse    Unspecified adverse effect of unspecified drug, medicinal and biological substance    Unspecified gastritis and gastroduodenitis without mention of hemorrhage    Urinary frequency    Past Surgical History:  Procedure Laterality Date   BACK SURGERY     x 3   HERNIA REPAIR  1965, 1968   INGUINAL HERNIA REPAIR  07/19/2011   Procedure: LAPAROSCOPIC INGUINAL HERNIA;  Surgeon: Odis Hollingshead, MD;  Location: [facility];  Service: General;  Laterality: Right;  Laparoscopic Repair of Recurrent Right  Ingunial Hernia with Mesh   microdisectomy  03/02/2003, 06/29/2003, 12/24/2005   TONSILLECTOMY     Family History  Problem Relation Age of Onset   Colon cancer Mother    Dementia Mother    Parkinsonism Father 81   Lung cancer Paternal Grandfather        smoker   Liver disease Neg Hx    Kidney disease Neg Hx    Esophageal cancer Neg Hx    Social History   Socioeconomic History   Marital status: Widowed    Spouse  name: Not on file   Number of children: 2   Years of education: college   Highest education level: Not on file  Occupational History   Occupation: retired    Fish farm manager: RETIRED  Tobacco Use   Smoking status: Never   Smokeless tobacco: Never  Vaping Use   Vaping Use: Never used  Substance and Sexual Activity   Alcohol use: No    Alcohol/week: 0.0 standard drinks    Comment: less than 5 per week   Drug use: No   Sexual activity: Not on file  Other Topics Concern   Not on file  Social History Narrative   Lives at [place], [place]   Patient is right handed.   Patient drinks 1 cup of caffeine per day.   Social Determinants of Health   Financial Resource Strain:  Low Risk    Difficulty of Paying Living Expenses: Not hard at all  Food Insecurity: No Food Insecurity   Worried About Running Out of Food in the Last Year: Never true   Ran Out of Food in the Last Year: Never true  Transportation Needs: No Transportation Needs   Lack of Transportation (Medical): No   Lack of Transportation (Non-Medical): No  Physical Activity: Inactive   Days of Exercise per Week: 0 days   Minutes of Exercise per Session: 0 min  Stress: No Stress Concern Present   Feeling of Stress : Not at all  Social Connections: Moderately Isolated   Frequency of Communication with Friends and Family: Twice a week   Frequency of Social Gatherings with Friends and Family: Twice a week   Attends Religious Services: 1 to 4 times per year   Active Member of [employer] or Organizations: No   Attends Archivist Meetings: Never   Marital Status: Widowed    Tobacco Counseling Counseling given: Not Answered   Clinical Intake:  Pre-visit preparation completed: No  Pain : No/denies pain     Nutritional Risks: None Diabetes: No  How often do you need to have someone help you when you read instructions, pamphlets, or other written materials from your doctor or pharmacy?: 1 - Never What is the last  grade level you completed in school?: college  Diabetic?no  Interpreter Needed?: No  Information entered by :: [place] of Daily Living In your present state of health, do you have any difficulty performing the following activities: 05/17/2021 06/22/2020  Hearing? Y N  Vision? N N  Difficulty concentrating or making decisions? Tempie Donning  Walking or climbing stairs? Y Y  Comment - leg weakness  Dressing or bathing? N N  Comment lives in assited living -  Doing errands, shopping? N Y  Conservation officer, nature and eating ? N Y  Using the Toilet? N N  In the past six months, have you accidently leaked urine? N Y  Do you have problems with loss of bowel control? N N  Managing your Medications? Y N  Comment lives in assited living -  Managing your Finances? N N  Housekeeping or managing your Housekeeping? N Y  Some recent data might be hidden    Patient Care Team: Eulas Post, MD as PCP - General (Family Medicine) Lorretta Harp, MD as PCP - Cardiology (Cardiology)  Indicate any recent Medical Services you may have received from other than [facility] providers in the past year (date may be approximate).     Assessment:   This is a routine wellness examination for [name].  Hearing/Vision screen Vision Screening - Comments:: Annual eye exams wears glasses   Dietary issues and exercise activities discussed: Current Exercise Habits: The patient does not participate in regular exercise at present, Exercise limited by: neurologic condition(s)   Goals Addressed   None    Depression Screen PHQ 2/9 Scores 05/17/2021 05/17/2021 05/17/2021 06/22/2020 03/31/2018 03/28/2017 05/14/2016  PHQ - 2 Score 0 0 0 0 0 0 0  PHQ- 9 Score - - - 0 - - -    Fall Risk Fall Risk  05/17/2021 06/22/2020 05/14/2018 03/31/2018 03/31/2018  Falls in the past year? 0 1 Yes Yes Yes  Comment - - - - fell in march and broke a couple ribs   Number falls in past yr: 0 1 2 or more - 2 or more  Comment - - - -  mistep due to frozen; start suffling backwards. steps and hands were full; rollator  Injury with Fall? 0 0 No - Yes  Risk for fall due to : Impaired balance/gait Impaired balance/gait;Medication side effect - - History of fall(s);Impaired balance/gait  Risk for fall due to: Comment walker - - - -  Follow up Falls evaluation completed;Education provided Falls evaluation completed;Falls prevention discussed - - -    FALL RISK PREVENTION PERTAINING TO THE HOME:  Any stairs in or around the home? No  If so, are there any without handrails? No  Home [place] of loose throw rugs in walkways, pet beds, electrical cords, etc? Yes  Adequate lighting in your home to reduce risk of falls? Yes   ASSISTIVE DEVICES UTILIZED TO PREVENT FALLS:  Life alert? No  Use of a cane, walker or w/c? Yes  Grab bars in the bathroom? Yes  Shower chair or bench in shower? Yes  Elevated toilet seat or a handicapped toilet? Yes    Cognitive Function: Normal cognitive status assessed by direct observation by this Nurse Health Advisor. No abnormalities found.   MMSE - Mini Mental State Exam 01/25/2021 12/14/2019 09/15/2018 05/14/2018 03/31/2018  Not completed: - - - - (No Data)  Orientation to time 1 3 4 3  -  Orientation to Place 5 5 5 5  -  Registration 3 3 3 3  -  Attention/ Calculation 0 1 2 3  -  Recall 3 3 2 2  -  Language- name 2 objects 1 2 2 2  -  Language- repeat 1 1 1 1  -  Language- follow 3 step command 1 3 3 3  -  Language- read & follow direction 1 1 1 1  -  Write a sentence 0 1 1 1  -  Copy design 0 0 0 0 -  Total score 16 23 24 24  -     6CIT Screen 06/22/2020  What Year? 0 points  What month? 3 points  What time? 0 points  Count back from 20 4 points  Months in reverse 4 points  Repeat phrase 10 points  Total Score 21    Immunizations Immunization History  Administered Date(s) Administered   Influenza Whole 08/19/2005, 05/09/2008, 05/16/2009, 05/21/2010   Influenza, High Dose Seasonal PF 05/14/2016,  04/25/2017   Influenza,inj,Quad PF,6+ Mos 05/07/2013, 06/14/2014   Influenza-Unspecified 06/02/2015, 05/26/2020   Moderna Sars-Covid-2 Vaccination 09/04/2019, 10/02/2019   PPD Test 12/25/2017   Pneumococcal Conjugate-13 03/28/2017   Pneumococcal Polysaccharide-23 03/07/2014   Td 08/19/2001   Tdap 03/07/2014   Zoster, Live 09/24/2013    TDAP status: Up to date  Flu Vaccine status: Up to date  Pneumococcal vaccine status: Up to date  Covid-19 vaccine status: Completed vaccines  Qualifies for Shingles Vaccine? Yes   Zostavax completed Yes   Shingrix Completed?: Yes  Screening Tests Health Maintenance  Topic Date Due   Zoster Vaccines- Shingrix (1 of 2) Never done   COLONOSCOPY (Pts 45-85yrs Insurance coverage will need to be confirmed)  02/03/2016   COVID-19 Vaccine (3 - Moderna risk series) 10/30/2019   INFLUENZA VACCINE  03/19/2021   TETANUS/TDAP  03/07/2024   Hepatitis C Screening  Completed   HPV VACCINES  Aged Out    Health Maintenance  Health Maintenance Due  Topic Date Due   Zoster Vaccines- Shingrix (1 of 2) Never done   COLONOSCOPY (Pts 45-71yrs Insurance coverage will need to be confirmed)  02/03/2016   COVID-19 Vaccine (3 - Moderna risk series) 10/30/2019   INFLUENZA VACCINE  03/19/2021  Colorectal cancer screening: No longer required.   Lung Cancer Screening: (Low Dose CT Chest recommended if Age 71-80 years, 30 pack-year currently smoking OR have quit w/in 15years.) does not qualify.   Lung Cancer Screening Referral: n/a  Additional Screening:  Hepatitis C Screening: does not qualify; Completed 09/11/2017  Vision Screening: Recommended annual ophthalmology exams for early detection of glaucoma and other disorders of the eye. Is the patient up to date with their annual eye exam?  Yes  Who is the provider or what is the name of the office in which the patient attends annual eye exams? Dr.Hutton  If pt is not established with a provider, would they  like to be referred to a provider to establish care? No .   Dental Screening: Recommended annual dental exams for proper oral hygiene  Community Resource Referral / Chronic Care Management: CRR required this visit?  No   CCM required this visit?  No      Plan:     I have personally reviewed and noted the following in the patient's chart:   Medical and social history Use of alcohol, tobacco or illicit drugs  Current medications and supplements including opioid prescriptions. Patient is not currently taking opioid prescriptions. Functional ability and status Nutritional status Physical activity Advanced directives List of other physicians Hospitalizations, surgeries, and ER visits in previous 12 months Vitals Screenings to include cognitive, depression, and falls Referrals and appointments  In addition, I have reviewed and discussed with patient certain preventive protocols, quality metrics, and best practice recommendations. A written personalized care plan for preventive services as well as general preventive health recommendations were provided to patient.     Randel Pigg, LPN   7/74/1423   Nurse Notes: none

## 2021-06-27 ENCOUNTER — Ambulatory Visit: Payer: PPO | Admitting: Neurology

## 2021-06-27 ENCOUNTER — Encounter: Payer: Self-pay | Admitting: Neurology

## 2021-06-27 VITALS — BP 113/63 | HR 60 | Ht 69.0 in | Wt 146.0 lb

## 2021-06-27 DIAGNOSIS — F22 Delusional disorders: Secondary | ICD-10-CM

## 2021-06-27 DIAGNOSIS — F0280 Dementia in other diseases classified elsewhere without behavioral disturbance: Secondary | ICD-10-CM

## 2021-06-27 DIAGNOSIS — G20 Parkinson's disease: Secondary | ICD-10-CM | POA: Diagnosis not present

## 2021-06-27 DIAGNOSIS — Z9181 History of falling: Secondary | ICD-10-CM | POA: Diagnosis not present

## 2021-06-27 DIAGNOSIS — K5909 Other constipation: Secondary | ICD-10-CM | POA: Diagnosis not present

## 2021-06-27 DIAGNOSIS — R443 Hallucinations, unspecified: Secondary | ICD-10-CM | POA: Diagnosis not present

## 2021-06-27 NOTE — Patient Instructions (Signed)
It was nice to meet you both today.  As discussed, Parkinson's medications can cause side effects including worsening hallucinations, low blood pressure and involuntary movements, I would like to keep your medication the same.  I would recommend you continue with your memory medication called Namenda 10 mg twice daily.    You are a fall risk because of mobility issues, balance issues, and posture changes.  Please use your walker and assistance at all times.  We will continue with the Nuplazid and low-dose Seroquel at the current doses.  It is somewhat unusual to use 2 different antipsychotic medications but in your case there is justification for both when I look back at Dr. Jannifer Franklin' notes.

## 2021-06-27 NOTE — Progress Notes (Signed)
Subjective:    Patient ID: [name]. is a 75 y.o. male.  HPI    Interim history:   [name] is a 75 year old right-handed gentleman with an underlying medical history of reflux disease, hypertension, hyperlipidemia, back pain, sleep apnea, history of syncope, osteoporosis, fibromyalgia, depression, anxiety, memory loss parkinsonism, who presents for follow-up consultation of his Parkinson's disease affected with dementia and hallucinations as well as chronic constipation.  The patient is accompanied by his son, [name]) today, and presents for his follow-up appointment. He has previously followed with Dr. Jannifer [name], and he was last seen by him on 01/25/2021.  I reviewed the note and copied his note for reference below.  Today, 06/27/2021: He reports doing okay, history is primarily provided by his son.  His son reports that patient had a couple of falls recently, 2 days apart.  1 fall happened when patient was bending down to take something out of his fridge in his room.  The fridge is small and on the floor and he had to stoop lower.  He did not hurt himself thankfully.  A couple of days later he fell as he was trying to sit into his chair.  He uses a rolling walker with seat. He does get up at night to go to the bathroom with assistance.  He has had urinary incontinence at night.  Constipation has been severe at times.  He takes MiraLAX every third day at this time.  He takes Sinemet 1 pill 3 times daily at 6 AM, 12, and 6 PM daily, Comtan 200 mg 3 times daily, Namenda 10 mg twice daily, Seroquel 25 mg at 2 PM, Nuplazid 34 mg once daily at 8 AM.  His Parkinson symptoms date back to 02-Dec-2013.  He had memory loss around the same time.  His delusions and hallucinations date back to Dec 03, 2015.  His wife passed away in Dec 03, 2015 and he has been at [facility] assisted living since May 2018.  He has 2 grown sons.  His father had Parkinson's disease as well.  The patient's allergies, current medications, family  history, past medical history, past social history, past surgical history and problem list were reviewed and updated as appropriate.   Previously (copied from previous notes for reference):   01/25/21 (Dr. Jannifer [name]): << [name] is a 75 year old right-handed white male with a history of Parkinson's disease with associated dementia.  He has had a lot of hallucinations and delusional thinking that create anxiety for him.  He is on a relatively high dose of Celexa, he remains on Nuplazid and Seroquel without full control of his hallucinations and delusions.  He walks with a walker, he may fall on occasion.  He currently is residing at [facility].  He is having some frequent urinary and fecal incontinence, he may go several days without having a bowel movement have significant constipation or even impaction issues.  The patient comes in today with his son.  The patient oftentimes sleeps off and on throughout the day, but they are waking him up every 2 hours at night to check for incontinence issues.>>  His Past Medical History Is Significant For: Past Medical History:  Diagnosis Date   Abdominal pain, right lower quadrant    Abnormality of gait 05/16/2016   Acute prostatitis    Anxiety    Constipation    Degenerative disc disease    Depression    Diverticulosis of colon (without mention of hemorrhage)    Essential  and other specified forms of tremor    Family history of malignant neoplasm of gastrointestinal tract    Fibromyalgia    Ganglion and cyst of synovium, tendon, and bursa    GERD (gastroesophageal reflux disease)    Hemarthrosis, upper arm    Hyperlipidemia    Hypersomnia with sleep apnea, unspecified    Hypertension    Hypopotassemia    Localized osteoarthrosis not specified whether primary or secondary, lower leg    Lumbago    Memory difficulties 04/22/2013   Nocturia    Osteoporosis, unspecified    Other testicular hypofunction    Parkinson disease ([name]) 05/16/2016    Right inguinal hernia    Sleep apnea    last sleep study 11/11 on chart- Bipap with settings of 4 per  patient   Spinal stenosis, lumbar region, without neurogenic claudication    Syncope and collapse    Unspecified adverse effect of unspecified drug, medicinal and biological substance    Unspecified gastritis and gastroduodenitis without mention of hemorrhage    Urinary frequency     His Past Surgical History Is Significant For: Past Surgical History:  Procedure Laterality Date   BACK SURGERY     x 3   cataract surgery Bilateral    [facility]  07/19/2011   Procedure: LAPAROSCOPIC INGUINAL HERNIA;  Surgeon: Odis Hollingshead, MD;  Location: [facility];  Service: General;  Laterality: Right;  Laparoscopic Repair of Recurrent Right  Ingunial Hernia with Mesh   microdisectomy  03/02/2003, 06/29/2003, 12/24/2005   TONSILLECTOMY      His Family History Is Significant For: Family History  Problem Relation Age of Onset   Colon cancer Mother    Dementia Mother    Parkinsonism Father 68   Lung cancer Paternal Grandfather        smoker   Liver disease Neg Hx    Kidney disease Neg Hx    Esophageal cancer Neg Hx     His Social History Is Significant For: Social History   Socioeconomic History   Marital status: Widowed    Spouse name: Not on file   Number of children: 2   Years of education: college   Highest education level: Not on file  Occupational History   Occupation: retired    Fish farm manager: RETIRED  Tobacco Use   Smoking status: Never   Smokeless tobacco: Never  Vaping Use   Vaping Use: Never used  Substance and Sexual Activity   Alcohol use: No    Alcohol/week: 0.0 standard drinks    Comment: less than 5 per week   Drug use: No   Sexual activity: Not on file  Other Topics Concern   Not on file  Social History Narrative   Lives at [place] [place] alone in an assisted living apartment    Patient is right handed.   Patient  drinks 1 cup of caffeine per day.   Social Determinants of Health   Financial Resource Strain: Low Risk    Difficulty of Paying Living Expenses: Not hard at all  Food Insecurity: No Food Insecurity   Worried About Charity fundraiser in the Last Year: Never true   [place] in the Last Year: Never true  Transportation Needs: No Transportation Needs   Lack of Transportation (Medical): No   Lack of Transportation (Non-Medical): No  Physical Activity: Not on file  Stress: No Stress Concern Present   Feeling of Stress :  Not at all  Social Connections: Moderately Isolated   Frequency of Communication with Friends and Family: Twice a week   Frequency of Social Gatherings with Friends and Family: Twice a week   Attends Religious Services: 1 to 4 times per year   Active Member of [employer] or Organizations: No   Attends Archivist Meetings: Never   Marital Status: Widowed    His Allergies Are:  Allergies  Allergen Reactions   Nucynta [Tapentadol] Other (See Comments)    dizziness   Pregabalin     REACTION: dizziness and mental status changes   Exelon [Rivastigmine]     abd pain  :   His Current Medications Are:  Outpatient Encounter Medications as of 06/27/2021  Medication Sig   amLODipine (NORVASC) 10 MG tablet Take 1 tablet (10 mg total) by mouth daily.   Calcium Carbonate-Vitamin D (CALCIUM 600+D) 600-400 MG-UNIT tablet Take 1 tablet by mouth daily.   CALMOSEPTINE 0.44-20.6 % OINT APPLY TOPICALLY TO BUTTOCKS AND PERINEUM AFTER EACH INCONTINENCE EPISODE AND AFTER SHOWERS.   carbidopa-levodopa (SINEMET IR) 25-100 MG tablet Take 1 tablet by mouth 3 (three) times daily.   Cholecalciferol (VITAMIN D3) 125 MCG (5000 UT) CAPS Take 1 capsule by mouth daily.   citalopram (CELEXA) 40 MG tablet Take 1 tablet (40 mg total) by mouth daily.   entacapone (COMTAN) 200 MG tablet TAKE (1) TABLET BY MOUTH (3) TIMES DAILY.   famotidine (PEPCID) 20 MG tablet Take 1 tablet (20 mg total)  by mouth 2 (two) times daily.   KLOR-CON M20 20 MEQ tablet TAKE 3 TABLETS (60 MEQ TOTAL) BY MOUTH DAILY.   Krill Oil 1000 MG CAPS Take 1 capsule by mouth 2 (two) times daily.   losartan (COZAAR) 100 MG tablet TAKE 1 TABLET (100 MG TOTAL) BY MOUTH DAILY.   memantine (NAMENDA) 10 MG tablet TAKE 1 TABLET (10 MG TOTAL) BY MOUTH 2 (TWO) TIMES DAILY.   NUPLAZID 34 MG CAPS TAKE (1) CAPSULE BY MOUTH ONCE DAILY.   polyethylene glycol (MIRALAX / GLYCOLAX) 17 g packet Take 17 g by mouth daily. (Patient taking differently: Take 17 g by mouth every 3 (three) days.)   Probiotic Product (ALIGN) 4 MG CAPS Take by mouth daily.   QUEtiapine (SEROQUEL) 25 MG tablet 1 tablet at 2 pm   vitamin B-12 (CYANOCOBALAMIN) 50 MCG tablet Take 50 mcg by mouth daily.   trimethoprim-polymyxin b (POLYTRIM) ophthalmic solution Place 2 drops into both eyes every 4 (four) hours. Placed 2 drops into both eyes every 4 hours while awake for 7 days (Patient not taking: Reported on 06/27/2021)   No facility-administered encounter medications on file as of 06/27/2021.  :  Review of Systems:  Out of a complete 14 point review of systems, all are reviewed and negative with the exception of these symptoms as listed below:  Review of Systems  Neurological:        Patient is here with his son, [name] [facility]nea [name]). Patient reports he hasn't had much change since June but he has noticed decline with speech. He did well until about about 2 weeks he fell twice. He was leaning down to get in his small fridge and he tumbled. The other fall was when he had sunglasses on (after cataract surgery). Denies any broken bones or stitches, just bruising.    Objective:  Neurological Exam  Physical Exam Physical Examination:   Vitals:   06/27/21 1422 06/27/21 1426  BP: (!) 105/50 113/63  Pulse: (!) 59 60  General Examination: The patient is a very pleasant 75 y.o. male in no acute distress. He appears frail.  Well-groomed.    HEENT: Normocephalic,  atraumatic, pupils are equal, round and reactive to light, extraocular tracking is impaired, mild nuchal rigidity noted.  Forward flexion of the neck noted.  No carotid bruits.  Corrective eyeglasses in place, hearing grossly intact.  Speech with mild hypophonia, no voice tremor.  No limp, or jaw tremor.   Oropharynx exam reveals: mild mouth dryness, tongue protrudes centrally and palate elevates symmetrically.   Chest: Clear to auscultation without wheezing, rhonchi or crackles noted.  Heart: S1+S2+0, regular and normal without murmurs, rubs or gallops noted.   Abdomen: Soft, non-tender and non-distended.  Extremities: There is 1+ pitting edema in the distal lower extremities bilaterally.   Skin: Warm and dry with chronic changes noted, chronic appearing bruises.    Musculoskeletal: exam reveals no obvious joint deformities.  Neurologically:  Mental status: The patient is awake, alert and pays good attention.  His immediate and remote memory, attention, language skills and fund of knowledge are impaired.   MMSE - Mini Mental State Exam 01/25/2021 12/14/2019 09/15/2018 05/14/2018 03/31/2018 01/08/2018 06/03/2017  Not completed: - - - - (No Data) - -  Orientation to time 1 3 4 3  - 4 5  Orientation to Place 5 5 5 5  - 5 5  Registration 3 3 3 3  - 3 3  Attention/ Calculation 0 1 2 3  - 2 4  Recall 3 3 2 2  - 2 3  Language- name 2 objects 1 2 2 2  - 2 2  Language- repeat 1 1 1 1  - 1 1  Language- follow 3 step command 1 3 3 3  - 3 3  Language- read & follow direction 1 1 1 1  - 1 1  Write a sentence 0 1 1 1  - 1 1  Copy design 0 0 0 0 - 1 1  Total score 16 23 24 24  - 25 29   Mood is congruent and affect is normal.  Cranial nerves II - XII are as described above under HEENT exam.  Motor exam: Thin bulk, global strength of 4 out of 5.  No obvious resting tremor.  Perhaps mild increase in tone throughout, fine motor skills are moderately impaired throughout, no obvious lateralization noted.    Cerebellar testing: No dysmetria or intention tremor. There is no truncal or gait ataxia.  Sensory exam: intact to light touch in the upper and lower extremities.  Gait, station and balance: He stands with difficulty and requires assistance, posture is moderate to severely stooped.  He walks with assistance, did not bring his walker for this visit but holds onto his son.  He walks slowly with decreased stride length, decreased arm swing noted, significantly stooped posture.  Assessment and Plan:   In summary, Xavion Muscat. is a very pleasant 75 y.o.-year old male with an underlying medical history of reflux disease, hypertension, hyperlipidemia, back pain, sleep apnea, history of syncope, osteoporosis, fibromyalgia, depression, anxiety, memory loss parkinsonism, who presents for follow-up consultation of his parkinsonism, dementia, complicated by chronic constipation, delusions, hallucinations and falls.  His memory symptoms date back to at least 2014 and he started having tremors around that time as well.  He had gait changes subsequently.  He was initially felt to have essential tremor and memory loss.  His son reports that he did not have much in the way of success with levodopa therapy.  One has  to wonder if he has an atypical form of parkinsonism such as Lewy body dementia but it will be difficult to tease out at this time.  He does have evidence of hallucinations and delusions and is currently on Nuplazid as well as low-dose Seroquel.  He maintains on levodopa 1 pill 3 times daily at this time as well as Comtan 3 times a day.  I would favor not making any major changes at this time, at one point he was on a slightly higher dose of levodopa but he is at risk for hypotension.  He has a low normal blood pressure value today.  He recently had a couple of falls but has not fallen in over a year before then per son.  We talked about the importance of fall prevention and supportive care.  I suggested he  continue with his current medication regimen including Namenda 10 mg twice daily.  Of note, he had an intolerance to rivastigmine.  I suggested he continue with Nuplazid, low-dose Seroquel, Comtan and Sinemet as well.  We will plan a follow-up in this clinic in about 4 months, sooner if needed.  I answered all their questions today and the patient and his son were in agreement.  I spent 40 minutes in total face-to-face time and in reviewing records during pre-charting, more than 50% of which was spent in counseling and coordination of care, reviewing test results, reviewing medications and treatment regimen and/or in discussing or reviewing the diagnosis of parkinsonism, memory loss, the prognosis and treatment options. Pertinent laboratory and imaging test results that were available during this visit with the patient were reviewed by me and considered in my medical decision making (see chart for details).

## 2021-07-03 DIAGNOSIS — I10 Essential (primary) hypertension: Secondary | ICD-10-CM | POA: Diagnosis not present

## 2021-07-03 DIAGNOSIS — M810 Age-related osteoporosis without current pathological fracture: Secondary | ICD-10-CM | POA: Diagnosis not present

## 2021-07-03 DIAGNOSIS — K5909 Other constipation: Secondary | ICD-10-CM | POA: Diagnosis not present

## 2021-07-03 DIAGNOSIS — F0283 Dementia in other diseases classified elsewhere, unspecified severity, with mood disturbance: Secondary | ICD-10-CM | POA: Diagnosis not present

## 2021-07-03 DIAGNOSIS — Z9181 History of falling: Secondary | ICD-10-CM | POA: Diagnosis not present

## 2021-07-03 DIAGNOSIS — G20 Parkinson's disease: Secondary | ICD-10-CM | POA: Diagnosis not present

## 2021-07-03 DIAGNOSIS — G4710 Hypersomnia, unspecified: Secondary | ICD-10-CM | POA: Diagnosis not present

## 2021-07-03 DIAGNOSIS — E785 Hyperlipidemia, unspecified: Secondary | ICD-10-CM | POA: Diagnosis not present

## 2021-07-03 DIAGNOSIS — G4730 Sleep apnea, unspecified: Secondary | ICD-10-CM | POA: Diagnosis not present

## 2021-07-03 DIAGNOSIS — R351 Nocturia: Secondary | ICD-10-CM | POA: Diagnosis not present

## 2021-07-03 DIAGNOSIS — M1909 Primary osteoarthritis, other specified site: Secondary | ICD-10-CM | POA: Diagnosis not present

## 2021-07-03 DIAGNOSIS — K219 Gastro-esophageal reflux disease without esophagitis: Secondary | ICD-10-CM | POA: Diagnosis not present

## 2021-07-03 DIAGNOSIS — M48061 Spinal stenosis, lumbar region without neurogenic claudication: Secondary | ICD-10-CM | POA: Diagnosis not present

## 2021-07-03 DIAGNOSIS — F32A Depression, unspecified: Secondary | ICD-10-CM | POA: Diagnosis not present

## 2021-07-03 DIAGNOSIS — E291 Testicular hypofunction: Secondary | ICD-10-CM | POA: Diagnosis not present

## 2021-07-03 DIAGNOSIS — F0282 Dementia in other diseases classified elsewhere, unspecified severity, with psychotic disturbance: Secondary | ICD-10-CM | POA: Diagnosis not present

## 2021-07-03 DIAGNOSIS — M797 Fibromyalgia: Secondary | ICD-10-CM | POA: Diagnosis not present

## 2021-07-03 DIAGNOSIS — K5730 Diverticulosis of large intestine without perforation or abscess without bleeding: Secondary | ICD-10-CM | POA: Diagnosis not present

## 2021-07-03 DIAGNOSIS — M5136 Other intervertebral disc degeneration, lumbar region: Secondary | ICD-10-CM | POA: Diagnosis not present

## 2021-07-03 DIAGNOSIS — F0284 Dementia in other diseases classified elsewhere, unspecified severity, with anxiety: Secondary | ICD-10-CM | POA: Diagnosis not present

## 2021-07-03 DIAGNOSIS — M2500 Hemarthrosis, unspecified joint: Secondary | ICD-10-CM | POA: Diagnosis not present

## 2021-07-04 ENCOUNTER — Telehealth: Payer: Self-pay

## 2021-07-04 NOTE — Telephone Encounter (Signed)
[facility] physical therapist called requesting verbal orders for physical therapy  1x 8 week  For strengthen, balance, and walking Call back # [phone] can leave voicemail

## 2021-07-05 NOTE — Telephone Encounter (Signed)
Spoke with [name], orders have been given.

## 2021-08-07 DIAGNOSIS — M810 Age-related osteoporosis without current pathological fracture: Secondary | ICD-10-CM | POA: Diagnosis not present

## 2021-08-07 DIAGNOSIS — K5730 Diverticulosis of large intestine without perforation or abscess without bleeding: Secondary | ICD-10-CM | POA: Diagnosis not present

## 2021-08-07 DIAGNOSIS — E785 Hyperlipidemia, unspecified: Secondary | ICD-10-CM | POA: Diagnosis not present

## 2021-08-07 DIAGNOSIS — F0282 Dementia in other diseases classified elsewhere, unspecified severity, with psychotic disturbance: Secondary | ICD-10-CM | POA: Diagnosis not present

## 2021-08-07 DIAGNOSIS — K219 Gastro-esophageal reflux disease without esophagitis: Secondary | ICD-10-CM | POA: Diagnosis not present

## 2021-08-07 DIAGNOSIS — G4730 Sleep apnea, unspecified: Secondary | ICD-10-CM | POA: Diagnosis not present

## 2021-08-07 DIAGNOSIS — R351 Nocturia: Secondary | ICD-10-CM | POA: Diagnosis not present

## 2021-08-07 DIAGNOSIS — M1909 Primary osteoarthritis, other specified site: Secondary | ICD-10-CM | POA: Diagnosis not present

## 2021-08-07 DIAGNOSIS — M797 Fibromyalgia: Secondary | ICD-10-CM | POA: Diagnosis not present

## 2021-08-07 DIAGNOSIS — M48061 Spinal stenosis, lumbar region without neurogenic claudication: Secondary | ICD-10-CM | POA: Diagnosis not present

## 2021-08-07 DIAGNOSIS — I10 Essential (primary) hypertension: Secondary | ICD-10-CM | POA: Diagnosis not present

## 2021-08-07 DIAGNOSIS — G20 Parkinson's disease: Secondary | ICD-10-CM | POA: Diagnosis not present

## 2021-08-07 DIAGNOSIS — K5909 Other constipation: Secondary | ICD-10-CM | POA: Diagnosis not present

## 2021-08-07 DIAGNOSIS — F0283 Dementia in other diseases classified elsewhere, unspecified severity, with mood disturbance: Secondary | ICD-10-CM | POA: Diagnosis not present

## 2021-08-07 DIAGNOSIS — G4710 Hypersomnia, unspecified: Secondary | ICD-10-CM | POA: Diagnosis not present

## 2021-08-07 DIAGNOSIS — F32A Depression, unspecified: Secondary | ICD-10-CM | POA: Diagnosis not present

## 2021-08-07 DIAGNOSIS — M5136 Other intervertebral disc degeneration, lumbar region: Secondary | ICD-10-CM | POA: Diagnosis not present

## 2021-08-07 DIAGNOSIS — Z9181 History of falling: Secondary | ICD-10-CM | POA: Diagnosis not present

## 2021-08-07 DIAGNOSIS — F0284 Dementia in other diseases classified elsewhere, unspecified severity, with anxiety: Secondary | ICD-10-CM | POA: Diagnosis not present

## 2021-08-07 DIAGNOSIS — M2500 Hemarthrosis, unspecified joint: Secondary | ICD-10-CM | POA: Diagnosis not present

## 2021-08-07 DIAGNOSIS — E291 Testicular hypofunction: Secondary | ICD-10-CM | POA: Diagnosis not present

## 2021-08-07 IMAGING — DX DG CHEST 1V PORT
1 series · 1 of 1 positions shown · non-contrast
Comparison: 10/30/2017

CLINICAL DATA: Fever for 3 days.

EXAM:
PORTABLE CHEST 1 VIEW

[chest ap]
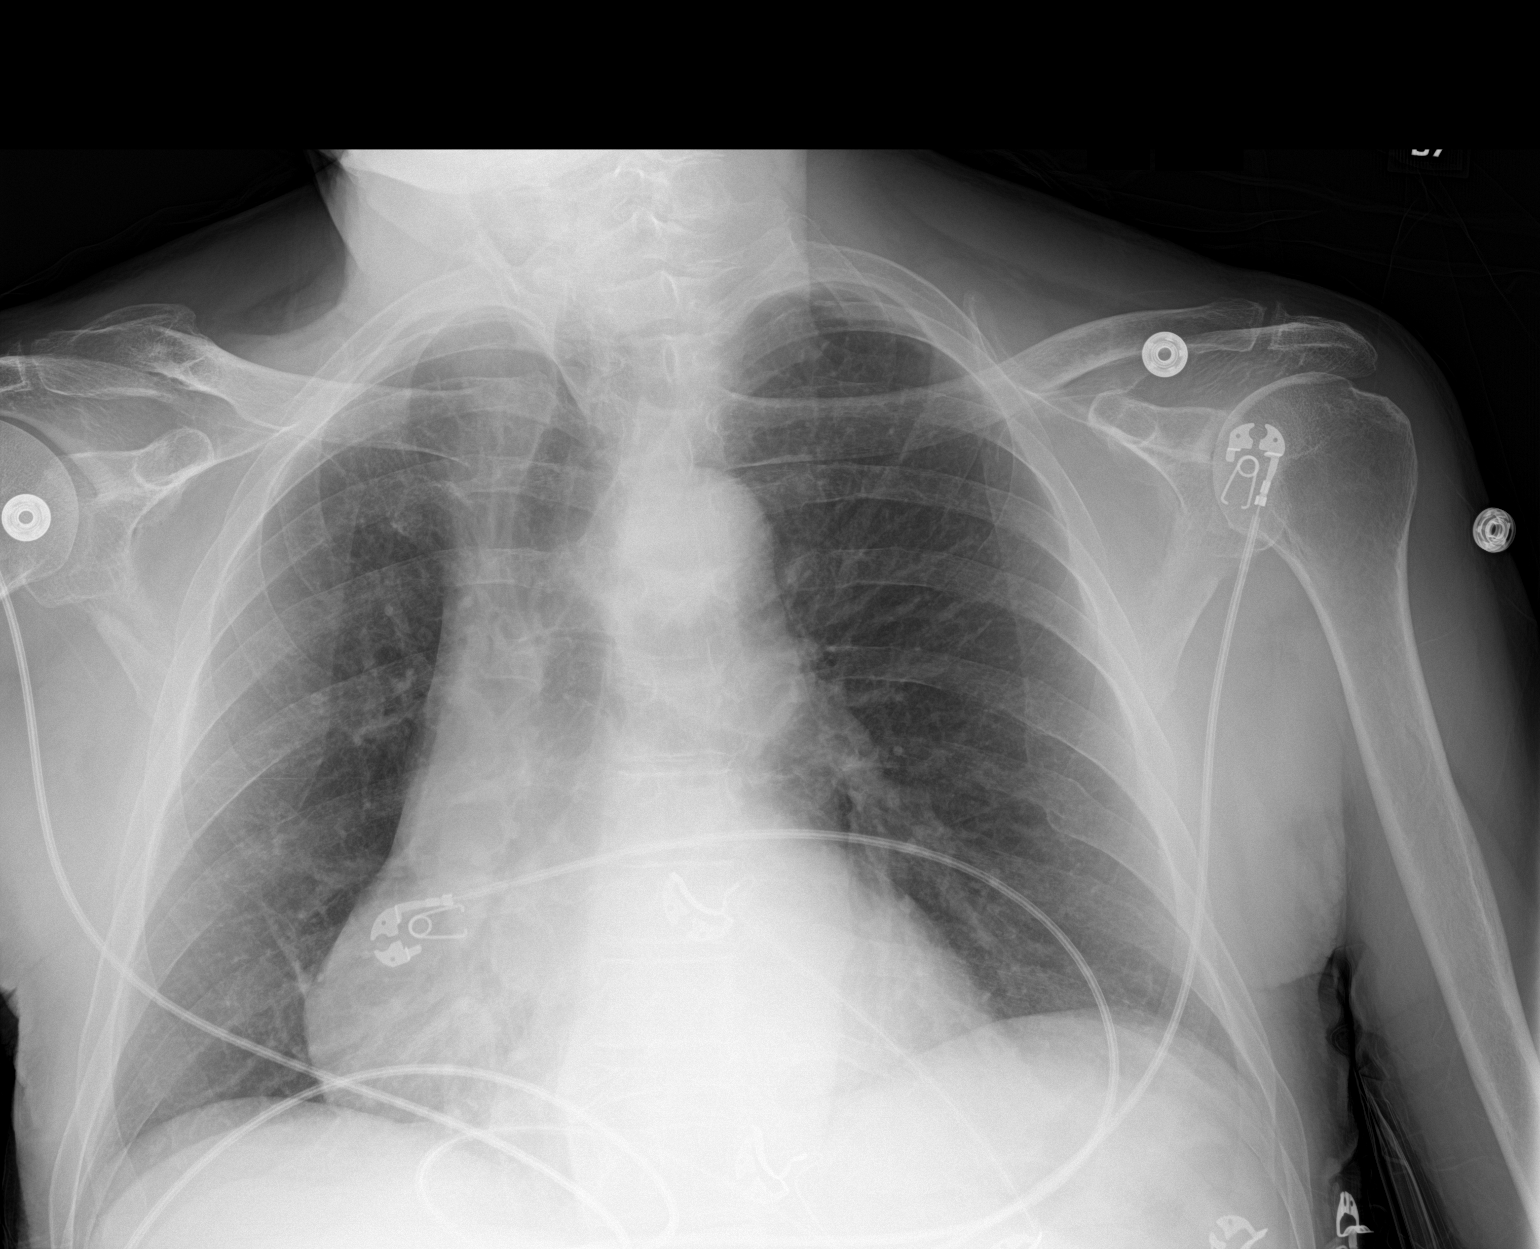

[1 of 1 positions shown; findings below may reference images not displayed]

FINDINGS: Mild cardiomegaly remains stable. Both lungs are clear. No evidence
of pneumothorax or pleural effusion.
IMPRESSION: Stable mild cardiomegaly. No active lung disease.

## 2021-08-07 IMAGING — CT CT ABD-PELV W/ CM
2 of 5 series · 15 of 46 positions shown, 17 images · IV contrast (omnipaque)
Comparison: CT 07/18/2014

CLINICAL DATA: Bowel obstruction suspected fever, abd pain,
distension, no flatus, constiaption. hx of hernia surgery. rule out
obstruction

EXAM:
CT ABDOMEN AND PELVIS WITH CONTRAST
TECHNIQUE: Multidetector CT imaging of the abdomen and pelvis was performed
using the standard protocol following bolus administration of
intravenous contrast.
CONTRAST:  100mL OMNIPAQUE IOHEXOL 300 MG/ML  SOLN

[Series 2: axial st · axial · 0.92mm/px · z∈[+1110,+1504]mm · 12 of 93 slices shown, 14 images]
[im 7/93  soft-tissue]
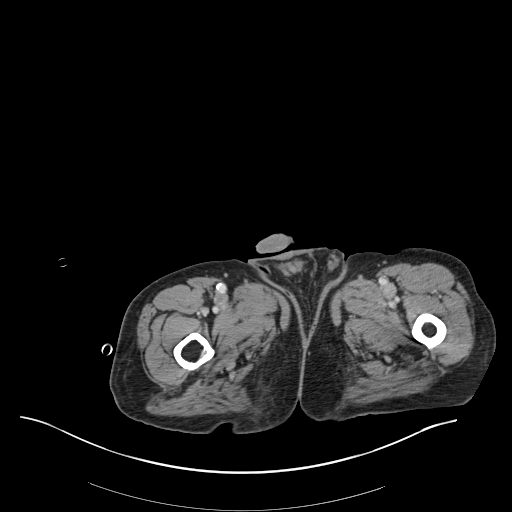
[im 7/93  bone]
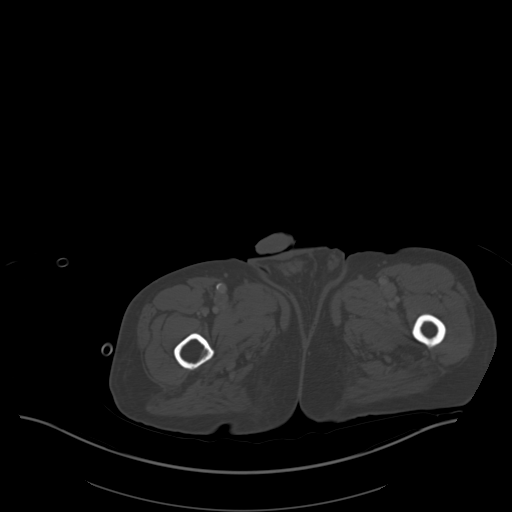
[im 13/93  soft-tissue]
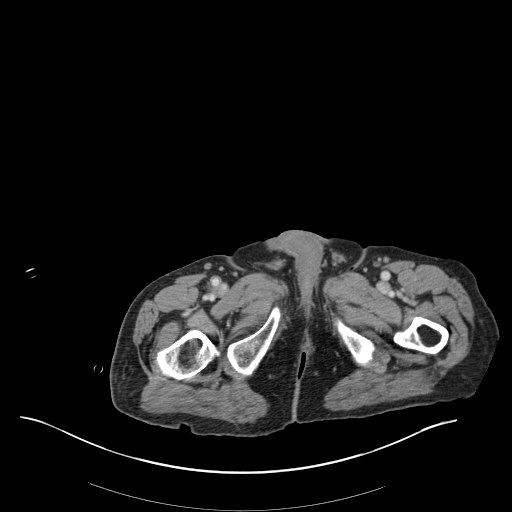
[im 19/93  soft-tissue]
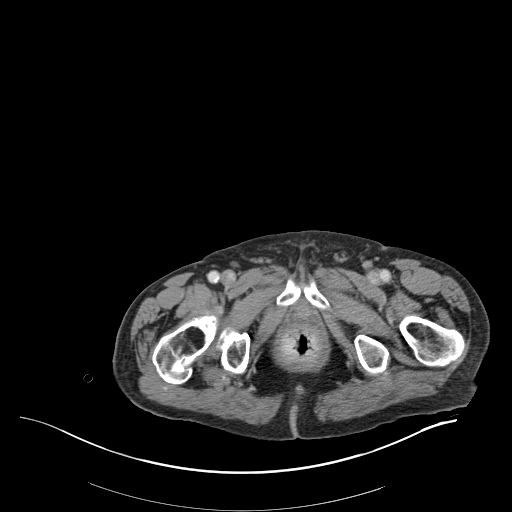
[im 31/93  soft-tissue]
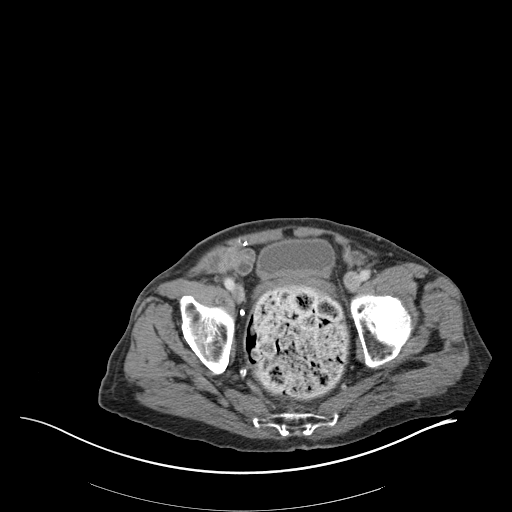
[im 37/93  soft-tissue]
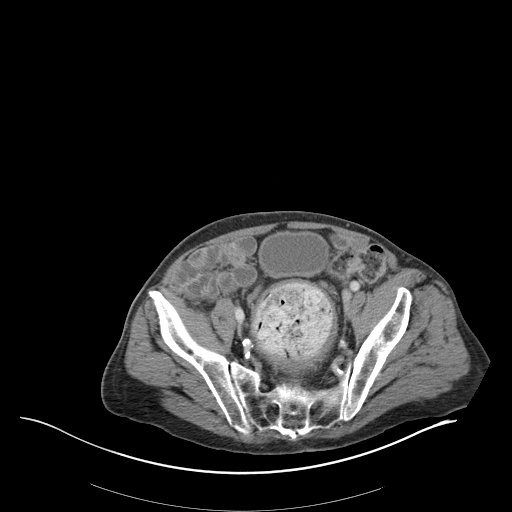
[im 43/93  soft-tissue]
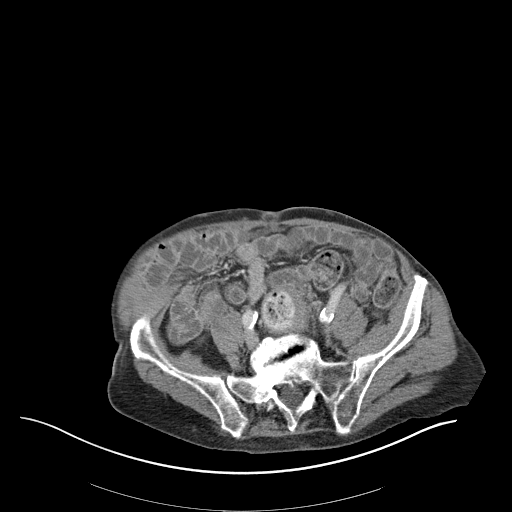
[im 50/93  soft-tissue]
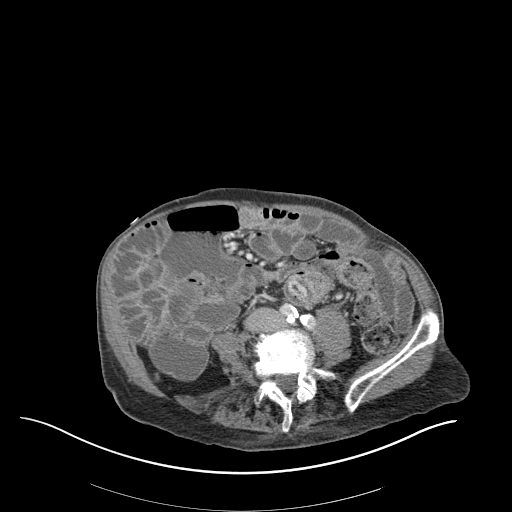
[im 56/93  soft-tissue]
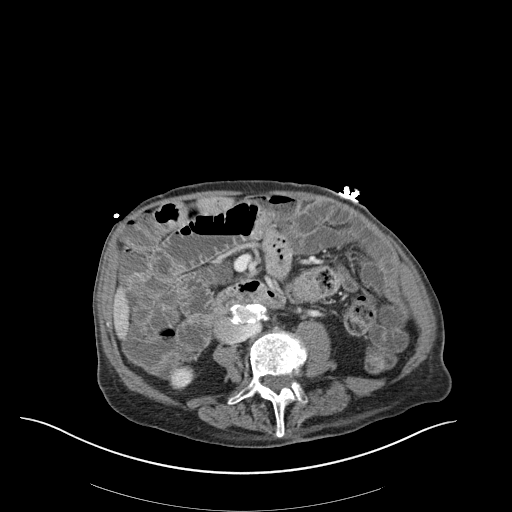
[im 62/93  soft-tissue]
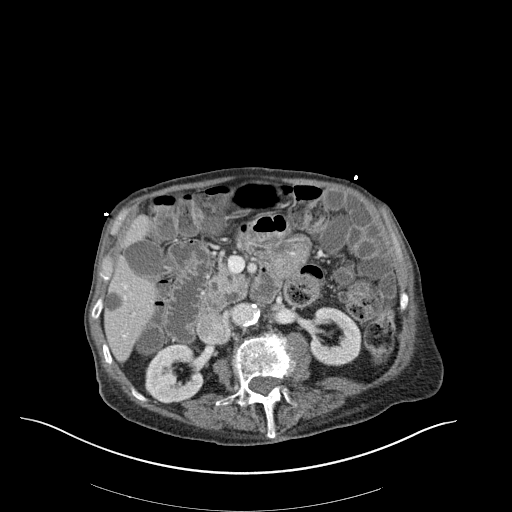
[im 62/93  bone]
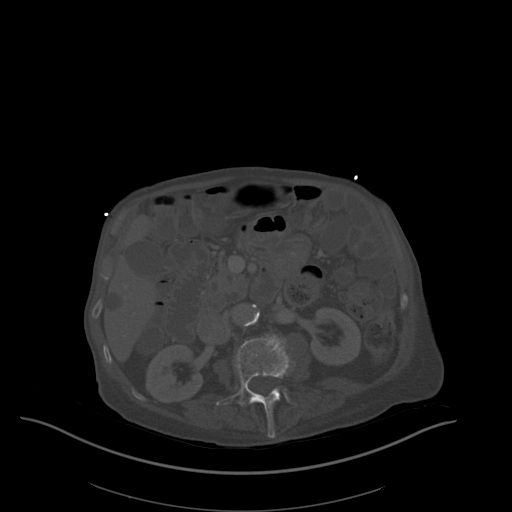
[im 74/93  soft-tissue]
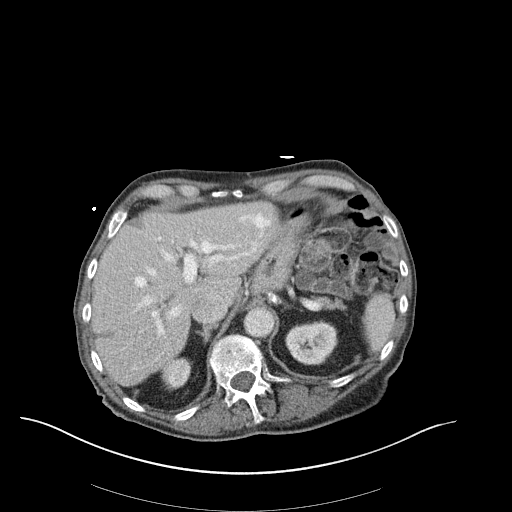
[im 80/93  soft-tissue]
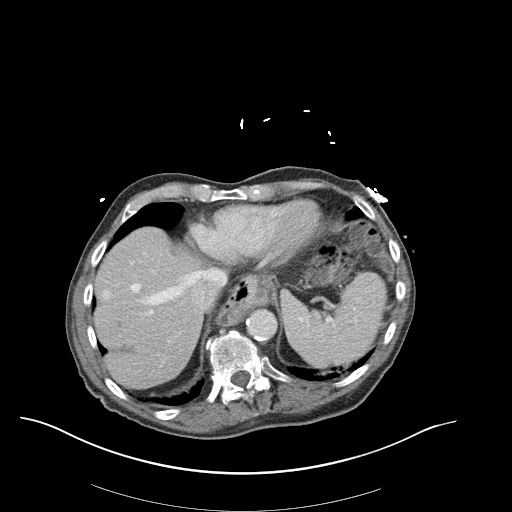
[im 86/93  soft-tissue]
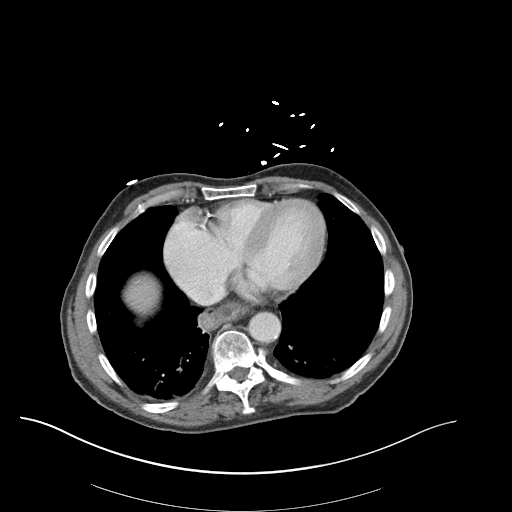

[Series 4: coronal st · coronal · 0.75mm/px · 3 of 155 slices shown]
[im 52/155  soft-tissue]
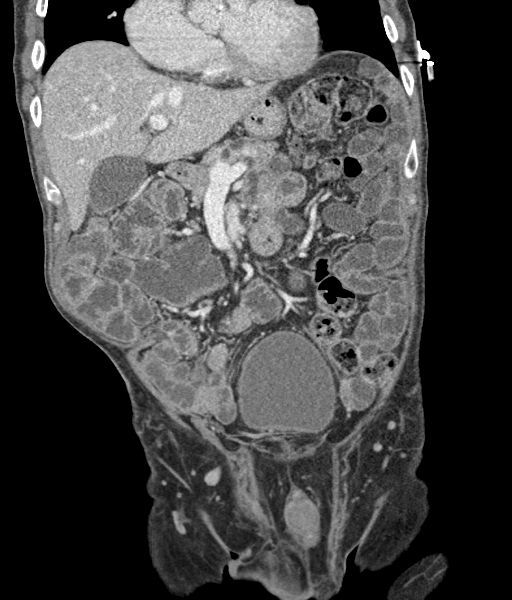
[im 69/155  soft-tissue]
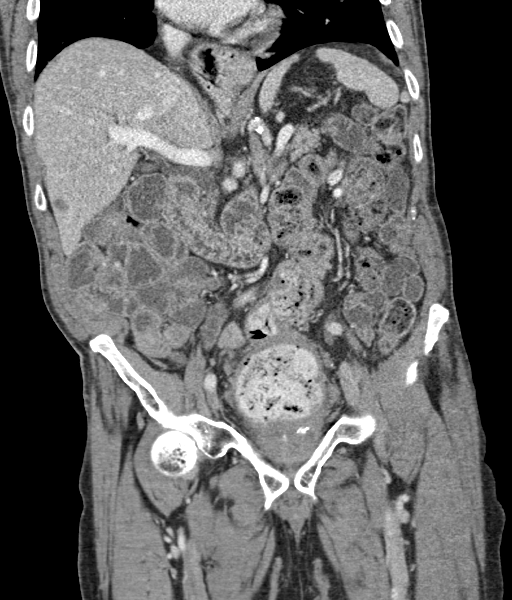
[im 86/155  soft-tissue]
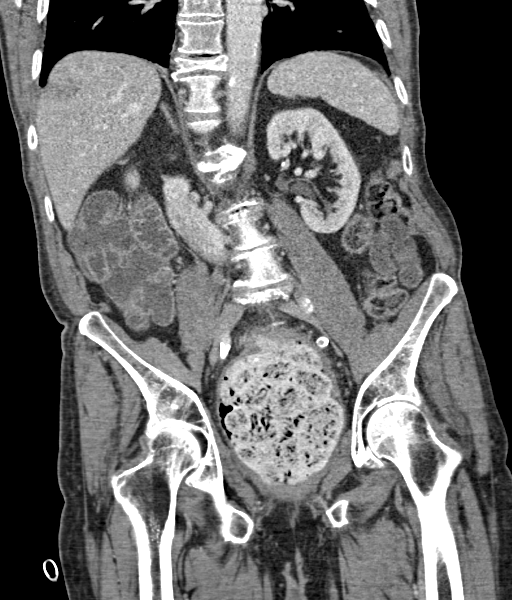

[15 of 46 positions shown; findings below may reference images not displayed]

FINDINGS: Lower chest: Upper normal heart size with coronary artery
calcifications. Heterogeneous basilar pulmonary parenchyma primarily
dependently, favoring hypoventilatory atelectasis. Moderate-sized
hiatal hernia.

Hepatobiliary: Enhancing focus in the right lobe of the liver
appears contiguous with the right hepatic vein and right portal vein
suggesting AV malformation. Unchanged in size and appearance from
prior exam. Hyperenhancing lesion in the left lobe has Peri
communication to the left hepatic and left portal veins, also likely
vascular malformation. This is also unchanged. Scattered simple
hepatic cysts. Gallbladder physiologically distended, no calcified
stone. No biliary dilatation.

Pancreas: Parenchymal atrophy. Mild dilatation of the proximal
pancreatic duct at 5 mm. There is no obvious pancreatic mass or
obstructing lesion. No peripancreatic fat stranding.

Spleen: Normal in size without focal abnormality.

Adrenals/Urinary Tract: Normal adrenal glands. No hydronephrosis or
perinephric edema. Homogeneous renal enhancement with symmetric
excretion on delayed phase imaging. No evidence of renal stone or
focal renal lesion. Urinary bladder is partially distended,
displaced anteriorly by stool ball distending the rectum.

Stomach/Bowel: Moderate hiatal hernia. No abnormal gastric
distension. Small bowel diffusely fluid-filled and prominent, but
not overtly dilated. No small bowel transition point. Appendix is
tentatively visualized and normal. Liquid stool/fluid-filled cecum
and ascending colon. There is moderate volume of formed stool in the
transverse and distal colon. Sigmoid colonic redundancy. There is a
large stool ball distending the rectum, rectal distention of 9.8 cm.
Minimal rectal wall thickening, series 2, image 57 with faint
perirectal edema. No pneumatosis.

Vascular/Lymphatic: Advanced aortic atherosclerosis. Aortic
tortuosity. No aortic aneurysm. Retroaortic left renal vein. Patent
portal vein. No abdominopelvic adenopathy.

Reproductive: Prostate gland not well seen, may be atrophic or
absent.

Other: Prior right inguinal hernia repair without evidence of
recurrent hernia. No ascites or free air.

Musculoskeletal: Scoliosis and multilevel degenerative change in the
spine. Moderate L1 compression fracture. Severe T10 compression
fracture. Diffuse thoracolumbar degenerative change.
IMPRESSION: 1. Large stool ball distending the rectum, consistent with fecal
impaction. Minimal rectal wall thickening with faint perirectal
edema. Moderate stool in the transverse and descending colon.
2. Fluid-filled and prominent small bowel without transition point,
favoring ileus related to fecal impaction.
3. Prior right inguinal hernia repair without recurrent hernia.
4. Mild pancreatic parenchymal atrophy. Mild dilatation of the
proximal pancreatic duct at 5 mm. No evidence of obstructing mass.
5. Moderate hiatal hernia.
6. Unchanged liver lesions, likely vascular malformations. These are
stable from 4464.
7. Moderate L1 and severe T10 compression fractures, new from 4464,
but age indeterminate.

Aortic Atherosclerosis ([id number].C).

## 2021-08-21 DIAGNOSIS — E291 Testicular hypofunction: Secondary | ICD-10-CM | POA: Diagnosis not present

## 2021-08-21 DIAGNOSIS — M1909 Primary osteoarthritis, other specified site: Secondary | ICD-10-CM | POA: Diagnosis not present

## 2021-08-21 DIAGNOSIS — M5136 Other intervertebral disc degeneration, lumbar region: Secondary | ICD-10-CM | POA: Diagnosis not present

## 2021-08-21 DIAGNOSIS — G4710 Hypersomnia, unspecified: Secondary | ICD-10-CM | POA: Diagnosis not present

## 2021-08-21 DIAGNOSIS — M797 Fibromyalgia: Secondary | ICD-10-CM | POA: Diagnosis not present

## 2021-08-21 DIAGNOSIS — F32A Depression, unspecified: Secondary | ICD-10-CM | POA: Diagnosis not present

## 2021-08-21 DIAGNOSIS — F0282 Dementia in other diseases classified elsewhere, unspecified severity, with psychotic disturbance: Secondary | ICD-10-CM | POA: Diagnosis not present

## 2021-08-21 DIAGNOSIS — G4730 Sleep apnea, unspecified: Secondary | ICD-10-CM | POA: Diagnosis not present

## 2021-08-21 DIAGNOSIS — M48061 Spinal stenosis, lumbar region without neurogenic claudication: Secondary | ICD-10-CM | POA: Diagnosis not present

## 2021-08-21 DIAGNOSIS — K219 Gastro-esophageal reflux disease without esophagitis: Secondary | ICD-10-CM | POA: Diagnosis not present

## 2021-08-21 DIAGNOSIS — M810 Age-related osteoporosis without current pathological fracture: Secondary | ICD-10-CM | POA: Diagnosis not present

## 2021-08-21 DIAGNOSIS — K5730 Diverticulosis of large intestine without perforation or abscess without bleeding: Secondary | ICD-10-CM | POA: Diagnosis not present

## 2021-08-21 DIAGNOSIS — I10 Essential (primary) hypertension: Secondary | ICD-10-CM | POA: Diagnosis not present

## 2021-08-21 DIAGNOSIS — F0283 Dementia in other diseases classified elsewhere, unspecified severity, with mood disturbance: Secondary | ICD-10-CM | POA: Diagnosis not present

## 2021-08-21 DIAGNOSIS — F0284 Dementia in other diseases classified elsewhere, unspecified severity, with anxiety: Secondary | ICD-10-CM | POA: Diagnosis not present

## 2021-08-21 DIAGNOSIS — R351 Nocturia: Secondary | ICD-10-CM | POA: Diagnosis not present

## 2021-08-21 DIAGNOSIS — Z9181 History of falling: Secondary | ICD-10-CM | POA: Diagnosis not present

## 2021-08-21 DIAGNOSIS — G20 Parkinson's disease: Secondary | ICD-10-CM | POA: Diagnosis not present

## 2021-08-21 DIAGNOSIS — K5909 Other constipation: Secondary | ICD-10-CM | POA: Diagnosis not present

## 2021-08-21 DIAGNOSIS — E785 Hyperlipidemia, unspecified: Secondary | ICD-10-CM | POA: Diagnosis not present

## 2021-08-21 DIAGNOSIS — M2500 Hemarthrosis, unspecified joint: Secondary | ICD-10-CM | POA: Diagnosis not present

## 2021-08-28 DIAGNOSIS — M5136 Other intervertebral disc degeneration, lumbar region: Secondary | ICD-10-CM | POA: Diagnosis not present

## 2021-08-28 DIAGNOSIS — R351 Nocturia: Secondary | ICD-10-CM | POA: Diagnosis not present

## 2021-08-28 DIAGNOSIS — E785 Hyperlipidemia, unspecified: Secondary | ICD-10-CM | POA: Diagnosis not present

## 2021-08-28 DIAGNOSIS — F0283 Dementia in other diseases classified elsewhere, unspecified severity, with mood disturbance: Secondary | ICD-10-CM | POA: Diagnosis not present

## 2021-08-28 DIAGNOSIS — M48061 Spinal stenosis, lumbar region without neurogenic claudication: Secondary | ICD-10-CM | POA: Diagnosis not present

## 2021-08-28 DIAGNOSIS — G20 Parkinson's disease: Secondary | ICD-10-CM | POA: Diagnosis not present

## 2021-08-28 DIAGNOSIS — K219 Gastro-esophageal reflux disease without esophagitis: Secondary | ICD-10-CM | POA: Diagnosis not present

## 2021-08-28 DIAGNOSIS — I10 Essential (primary) hypertension: Secondary | ICD-10-CM | POA: Diagnosis not present

## 2021-08-28 DIAGNOSIS — K5909 Other constipation: Secondary | ICD-10-CM | POA: Diagnosis not present

## 2021-08-28 DIAGNOSIS — M1909 Primary osteoarthritis, other specified site: Secondary | ICD-10-CM | POA: Diagnosis not present

## 2021-08-28 DIAGNOSIS — M810 Age-related osteoporosis without current pathological fracture: Secondary | ICD-10-CM | POA: Diagnosis not present

## 2021-08-28 DIAGNOSIS — K5730 Diverticulosis of large intestine without perforation or abscess without bleeding: Secondary | ICD-10-CM | POA: Diagnosis not present

## 2021-08-28 DIAGNOSIS — F0284 Dementia in other diseases classified elsewhere, unspecified severity, with anxiety: Secondary | ICD-10-CM | POA: Diagnosis not present

## 2021-08-28 DIAGNOSIS — F32A Depression, unspecified: Secondary | ICD-10-CM | POA: Diagnosis not present

## 2021-08-28 DIAGNOSIS — G4730 Sleep apnea, unspecified: Secondary | ICD-10-CM | POA: Diagnosis not present

## 2021-08-28 DIAGNOSIS — M2500 Hemarthrosis, unspecified joint: Secondary | ICD-10-CM | POA: Diagnosis not present

## 2021-08-28 DIAGNOSIS — E291 Testicular hypofunction: Secondary | ICD-10-CM | POA: Diagnosis not present

## 2021-08-28 DIAGNOSIS — M797 Fibromyalgia: Secondary | ICD-10-CM | POA: Diagnosis not present

## 2021-08-28 DIAGNOSIS — Z9181 History of falling: Secondary | ICD-10-CM | POA: Diagnosis not present

## 2021-08-28 DIAGNOSIS — F0282 Dementia in other diseases classified elsewhere, unspecified severity, with psychotic disturbance: Secondary | ICD-10-CM | POA: Diagnosis not present

## 2021-08-28 DIAGNOSIS — G4710 Hypersomnia, unspecified: Secondary | ICD-10-CM | POA: Diagnosis not present

## 2021-10-16 ENCOUNTER — Encounter: Payer: Self-pay | Admitting: Family Medicine

## 2021-10-16 ENCOUNTER — Ambulatory Visit (INDEPENDENT_AMBULATORY_CARE_PROVIDER_SITE_OTHER): Payer: PPO | Admitting: Family Medicine

## 2021-10-16 ENCOUNTER — Ambulatory Visit (INDEPENDENT_AMBULATORY_CARE_PROVIDER_SITE_OTHER): Payer: PPO

## 2021-10-16 ENCOUNTER — Other Ambulatory Visit: Payer: Self-pay

## 2021-10-16 VITALS — BP 140/90 | HR 64 | Temp 99.1°F | Ht 69.0 in | Wt 144.3 lb

## 2021-10-16 DIAGNOSIS — M25531 Pain in right wrist: Secondary | ICD-10-CM

## 2021-10-16 DIAGNOSIS — G20 Parkinson's disease: Secondary | ICD-10-CM | POA: Diagnosis not present

## 2021-10-16 DIAGNOSIS — M7989 Other specified soft tissue disorders: Secondary | ICD-10-CM | POA: Diagnosis not present

## 2021-10-16 DIAGNOSIS — L603 Nail dystrophy: Secondary | ICD-10-CM | POA: Diagnosis not present

## 2021-10-16 DIAGNOSIS — I10 Essential (primary) hypertension: Secondary | ICD-10-CM

## 2021-10-16 DIAGNOSIS — S52501A Unspecified fracture of the lower end of right radius, initial encounter for closed fracture: Secondary | ICD-10-CM | POA: Diagnosis not present

## 2021-10-16 DIAGNOSIS — S52571A Other intraarticular fracture of lower end of right radius, initial encounter for closed fracture: Secondary | ICD-10-CM | POA: Diagnosis not present

## 2021-10-16 NOTE — Progress Notes (Signed)
Established Patient Office Visit  Subjective:  Patient ID: [name]., male    DOB: 1946/02/10  Age: 76 y.o. MRN: [id number]  CC:  Chief Complaint  Patient presents with   Hand Injury    Patient's son states the patient fell 4 days ago, states he noticed right hand swelling and bruising noted   Back Pain    HPI [name]. presents for the following issues  He has Parkinson's disease with progressive disease.  He has had history of falls.  He ambulates with a walker.  Friday he was at his assisted living residence and apparently fell backwards.  There was no report of any head injury or loss of consciousness.  He apparently used his right upper extremity to try to catch himself and had some bruising and swelling with bruising of the right hand and most of his pain is confined distal wrist.  No elbow pain.  No proximal humerus pain.  He has chronic low back pain and has had some pain around the coccyx area.  He has hypertension treated with losartan and amlodipine.  Does have history of autonomic dysfunction related to Parkinson's disease and orthostasis in the past.  No recent orthostatic symptoms.  He has thickened toenails bilaterally and they are trying to get podiatrist to come trim those at his residence.  He has paperwork to be completed for that today.  Son has pointed out previously that he seems to have some asymmetry in the abdomen and especially right abdominal area seems to be protruding outward.  He does not complain of any abdominal pain.  No recent change in appetite.  Weight is unchanged.  No change in bowel habits.  Past Medical History:  Diagnosis Date   Abdominal pain, right lower quadrant    Abnormality of gait 05/16/2016   Acute prostatitis    Anxiety    Constipation    Degenerative disc disease    Depression    Diverticulosis of colon (without mention of hemorrhage)    Essential and other specified forms of tremor    Family history of malignant  neoplasm of gastrointestinal tract    Fibromyalgia    Ganglion and cyst of synovium, tendon, and bursa    GERD (gastroesophageal reflux disease)    Hemarthrosis, upper arm    Hyperlipidemia    Hypersomnia with sleep apnea, unspecified    Hypertension    Hypopotassemia    Localized osteoarthrosis not specified whether primary or secondary, lower leg    Lumbago    Memory difficulties 04/22/2013   Nocturia    Osteoporosis, unspecified    Other testicular hypofunction    Parkinson disease ([place]) 05/16/2016   Right inguinal hernia    Sleep apnea    last sleep study 11/11 on chart- Bipap with settings of 4 per  patient   Spinal stenosis, lumbar region, without neurogenic claudication    Syncope and collapse    Unspecified adverse effect of unspecified drug, medicinal and biological substance    Unspecified gastritis and gastroduodenitis without mention of hemorrhage    Urinary frequency     Past Surgical History:  Procedure Laterality Date   BACK SURGERY     x 3   cataract surgery Bilateral    [place]  07/19/2011   Procedure: LAPAROSCOPIC INGUINAL HERNIA;  Surgeon: Odis Hollingshead, MD;  Location: [facility];  Service: General;  Laterality: Right;  Laparoscopic Repair of Recurrent  Right  Ingunial Hernia with Mesh   microdisectomy  03/02/2003, 06/29/2003, 12/24/2005   TONSILLECTOMY      Family History  Problem Relation Age of Onset   Colon cancer Mother    Dementia Mother    Parkinsonism Father 40   Lung cancer Paternal Grandfather        smoker   Liver disease Neg Hx    Kidney disease Neg Hx    Esophageal cancer Neg Hx     Social History   Socioeconomic History   Marital status: Widowed    Spouse name: Not on file   Number of children: 2   Years of education: college   Highest education level: Not on file  Occupational History   Occupation: retired    Fish farm manager: RETIRED  Tobacco Use   Smoking status: Never   Smokeless tobacco:  Never  Vaping Use   Vaping Use: Never used  Substance and Sexual Activity   Alcohol use: No    Alcohol/week: 0.0 standard drinks    Comment: less than 5 per week   Drug use: No   Sexual activity: Not on file  Other Topics Concern   Not on file  Social History Narrative   Lives at [place] [place] alone in an assisted living apartment    Patient is right handed.   Patient drinks 1 cup of caffeine per day.   Social Determinants of Health   Financial Resource Strain: Low Risk    Difficulty of Paying Living Expenses: Not hard at all  Food Insecurity: No Food Insecurity   Worried About Charity fundraiser in the Last Year: Never true   [place] in the Last Year: Never true  Transportation Needs: No Transportation Needs   Lack of Transportation (Medical): No   Lack of Transportation (Non-Medical): No  Physical Activity: Not on file  Stress: No Stress Concern Present   Feeling of Stress : Not at all  Social Connections: Moderately Isolated   Frequency of Communication with Friends and Family: Twice a week   Frequency of Social Gatherings with Friends and Family: Twice a week   Attends Religious Services: 1 to 4 times per year   Active Member of [employer] or Organizations: No   Attends Archivist Meetings: Never   Marital Status: Widowed  Human resources officer Violence: Not At Risk   Fear of Current or Ex-Partner: No   Emotionally Abused: No   Physically Abused: No   Sexually Abused: No    Outpatient Medications Prior to Visit  Medication Sig Dispense Refill   amLODipine (NORVASC) 10 MG tablet Take 1 tablet (10 mg total) by mouth daily. 90 tablet 3   Calcium Carbonate-Vitamin D (CALCIUM 600+D) 600-400 MG-UNIT tablet Take 1 tablet by mouth daily. 90 tablet 3   CALMOSEPTINE 0.44-20.6 % OINT APPLY TOPICALLY TO BUTTOCKS AND PERINEUM AFTER EACH INCONTINENCE EPISODE AND AFTER SHOWERS. 71 g 0   carbidopa-levodopa (SINEMET IR) 25-100 MG tablet Take 1 tablet by mouth 3  (three) times daily. 90 tablet 5   Cholecalciferol (VITAMIN D3) 125 MCG (5000 UT) CAPS Take 1 capsule by mouth daily.     citalopram (CELEXA) 40 MG tablet Take 1 tablet (40 mg total) by mouth daily. 90 tablet 1   entacapone (COMTAN) 200 MG tablet TAKE (1) TABLET BY MOUTH (3) TIMES DAILY. 90 tablet 0   famotidine (PEPCID) 20 MG tablet Take 1 tablet (20 mg total) by mouth 2 (two) times daily. 60 tablet 3  KLOR-CON M20 20 MEQ tablet TAKE 3 TABLETS (60 MEQ TOTAL) BY MOUTH DAILY. 90 tablet 1   Krill Oil 1000 MG CAPS Take 1 capsule by mouth 2 (two) times daily.     losartan (COZAAR) 100 MG tablet TAKE 1 TABLET (100 MG TOTAL) BY MOUTH DAILY. 90 tablet 2   memantine (NAMENDA) 10 MG tablet TAKE 1 TABLET (10 MG TOTAL) BY MOUTH 2 (TWO) TIMES DAILY. 180 tablet 3   NUPLAZID 34 MG CAPS TAKE (1) CAPSULE BY MOUTH ONCE DAILY. 90 capsule 1   polyethylene glycol (MIRALAX / GLYCOLAX) 17 g packet Take 17 g by mouth daily. (Patient taking differently: Take 17 g by mouth every 3 (three) days.) 14 each 3   Probiotic Product (ALIGN) 4 MG CAPS Take by mouth daily.     QUEtiapine (SEROQUEL) 25 MG tablet 1 tablet at 2 pm 30 tablet 2   trimethoprim-polymyxin b (POLYTRIM) ophthalmic solution Place 2 drops into both eyes every 4 (four) hours. Placed 2 drops into both eyes every 4 hours while awake for 7 days 10 mL 0   vitamin B-12 (CYANOCOBALAMIN) 50 MCG tablet Take 50 mcg by mouth daily.     No facility-administered medications prior to visit.    Allergies  Allergen Reactions   Nucynta [Tapentadol] Other (See Comments)    dizziness   Pregabalin     REACTION: dizziness and mental status changes   Exelon [Rivastigmine]     abd pain    ROS Review of Systems  Constitutional:  Negative for chills and fever.  Respiratory:  Negative for cough.   Cardiovascular:  Negative for chest pain.  Gastrointestinal:  Negative for abdominal pain, diarrhea, nausea and vomiting.  Musculoskeletal:  Positive for back pain.   Neurological:  Negative for dizziness, syncope and headaches.     Objective:    Physical Exam Vitals reviewed.  Cardiovascular:     Rate and Rhythm: Normal rate.  Pulmonary:     Effort: Pulmonary effort is normal.     Breath sounds: Normal breath sounds.  Musculoskeletal:     Comments: He has some diffuse swelling of the right wrist.  He has extensive bruising dorsum of the right hand and dorsal right wrist.  He has tenderness over the distal radius.  No distal ulnar tenderness.  No elbow tenderness.  No proximal humerus tenderness.  Minimal tenderness over the coccyx region.  No lumbar tenderness.  Skin:    Comments: Multiple thickened dystrophic toenails with changes reflecting likely onychomycosis  Neurological:     Mental Status: He is alert.    BP 140/90 (BP Location: Left Arm, Patient Position: Sitting, Cuff Size: Normal)    Pulse 64    Temp 99.1 F (37.3 C) (Oral)    Ht 5\' 9"  (1.753 m)    Wt 144 lb 4.8 oz (65.5 kg)    SpO2 98%    BMI 21.31 kg/m  Wt Readings from Last 3 Encounters:  10/16/21 144 lb 4.8 oz (65.5 kg)  06/27/21 146 lb (66.2 kg)  01/25/21 166 lb (75.3 kg)     Health Maintenance Due  Topic Date Due   Zoster Vaccines- Shingrix (1 of 2) Never done   COLONOSCOPY (Pts 45-40yrs Insurance coverage will need to be confirmed)  02/03/2016   COVID-19 Vaccine (3 - Moderna risk series) 10/30/2019   INFLUENZA VACCINE  03/19/2021    There are no preventive care reminders to display for this patient.  Lab Results  Component Value Date   TSH  2.81 11/06/2020   Lab Results  Component Value Date   WBC 7.8 12/10/2020   HGB 13.1 12/10/2020   HCT 38.5 (L) 12/10/2020   MCV 95.8 12/10/2020   PLT 158 12/10/2020   Lab Results  Component Value Date   NA 139 12/10/2020   K 3.3 (L) 12/10/2020   CO2 28 12/10/2020   GLUCOSE 127 (H) 12/10/2020   BUN 25 (H) 12/10/2020   CREATININE 1.18 12/10/2020   BILITOT 0.8 12/10/2020   ALKPHOS 104 12/10/2020   AST 23 12/10/2020    ALT 10 12/10/2020   PROT 7.2 12/10/2020   ALBUMIN 4.0 12/10/2020   CALCIUM 8.9 12/10/2020   ANIONGAP 10 12/10/2020   GFR 57.88 (L) 11/06/2020   Lab Results  Component Value Date   CHOL 203 (H) 03/02/2014   Lab Results  Component Value Date   HDL 76.90 03/02/2014   Lab Results  Component Value Date   LDLCALC 118 (H) 03/02/2014   Lab Results  Component Value Date   TRIG 41.0 03/02/2014   Lab Results  Component Value Date   CHOLHDL 3 03/02/2014   Lab Results  Component Value Date   HGBA1C 5.9 01/23/2010      Assessment & Plan:   #1 right wrist pain following fall.  Rule out distal radial fracture.  Obtain x-rays here and these will be over read  -By my reading he has distal radial fracture which is nondisplaced.  No significant angulation. -Orthopedic referral placed -Put in wrist splint for tonight -Continue ice and elevation  #2 hypertension stable.  Continue current medications  #3 dystrophic toenails.  Suspect onychomycosis.  Order written for podiatry assessment for nail trimming at his place of residence  #4 history of chronic low back pain.  No spinal tenderness to palpation.  Observe for now.   No orders of the defined types were placed in this encounter.   Follow-up: No follow-ups on file.    Carolann Littler, MD

## 2021-10-16 NOTE — Patient Instructions (Signed)
Does have distal radial fracture  I will set up orthopedic referral and hope to get in tomorrow.  If possible need to get him short arm wrist splint.

## 2021-10-19 ENCOUNTER — Telehealth: Payer: Self-pay

## 2021-10-19 DIAGNOSIS — S52501A Unspecified fracture of the lower end of right radius, initial encounter for closed fracture: Secondary | ICD-10-CM

## 2021-10-19 NOTE — Telephone Encounter (Signed)
Son of patient called  stating he has not heard anything about referral and wold like a call back with information to Ortho office ?

## 2021-10-22 NOTE — Telephone Encounter (Signed)
I spoke with our referral coordinator and she stated she has contacted the referring office but had to leave message and will reach out to the pt once she is contacted by referring office.  ?

## 2021-10-24 ENCOUNTER — Ambulatory Visit: Payer: PPO | Admitting: Neurology

## 2021-10-24 ENCOUNTER — Telehealth: Payer: Self-pay | Admitting: Family Medicine

## 2021-10-24 VITALS — BP 144/76 | HR 59 | Ht 69.0 in | Wt 142.0 lb

## 2021-10-24 DIAGNOSIS — R443 Hallucinations, unspecified: Secondary | ICD-10-CM

## 2021-10-24 DIAGNOSIS — G20 Parkinson's disease: Secondary | ICD-10-CM | POA: Diagnosis not present

## 2021-10-24 DIAGNOSIS — F0280 Dementia in other diseases classified elsewhere without behavioral disturbance: Secondary | ICD-10-CM | POA: Diagnosis not present

## 2021-10-24 DIAGNOSIS — K5909 Other constipation: Secondary | ICD-10-CM

## 2021-10-24 DIAGNOSIS — F22 Delusional disorders: Secondary | ICD-10-CM

## 2021-10-24 DIAGNOSIS — Z9181 History of falling: Secondary | ICD-10-CM

## 2021-10-24 MED ORDER — CITALOPRAM HYDROBROMIDE 20 MG PO TABS: 30.0000 mg | ORAL_TABLET | Freq: Every day | ORAL | 3 refills | Status: DC

## 2021-10-24 NOTE — Patient Instructions (Addendum)
I am sorry you fell and injured your wrist. Please check with Dr. Erick Blinks office regarding your referral to ortho. We cannot find any more information or phone number for ortho in [place]. ?I will reduce the Celexa to 30 mg daily.  ?Please follow up to see the nurse practitioner in about 4 months. ? ?

## 2021-10-24 NOTE — Telephone Encounter (Signed)
Pt's son called into the office and requested new referral to [facility] due to delay in scheduling previous referral. New referral orders per request  ?

## 2021-10-24 NOTE — Progress Notes (Signed)
Subjective:    Patient ID: [name]. is a 76 y.o. male.  HPI   Interim history:   [name] is a 76 year old right-handed gentleman with an underlying medical history of reflux disease, hypertension, hyperlipidemia, back pain, sleep apnea, history of syncope, osteoporosis, fibromyalgia, depression, anxiety, memory loss parkinsonism, who presents for follow-up consultation of his parkinsonism, complicated by dementia and hallucinations as well as chronic constipation.  The patient is accompanied by his son, [name]) again today. I first met him on 06/27/2021, at which time he was advised to continue with Sinemet 1 pill 3 times daily at 6 AM, 12, and 6 PM daily, Comtan 200 mg 3 times daily, Namenda 10 mg twice daily, Seroquel 25 mg at 2 PM, Nuplazid 34 mg once daily at 8 AM.   Today, 10/24/21: He reports discomfort in his right hand, it is swollen.  He recalls that he had fallen about 2 to 3 weeks ago.  His son reports that he he may have fallen 2 or 3 weeks ago but it was unwitnessed, he was noted to have swelling of his right hand and an x-ray recently confirmed a fracture of his right hand, primary care has referred him to the orthopedics but they have not heard about the appointment yet.  The patient reports a reasonable appetite.  Memory function is declining per son and he needs maximum care at his facility.  Primary care had wondered if his Celexa should be reduced from 40 mg to 20 mg, per son.  The patient's allergies, current medications, family history, past medical history, past social history, past surgical history and problem list were reviewed and updated as appropriate.    Previously (copied from previous notes for reference):    01/25/21 (Dr. Jannifer Franklin): << [name] is a 76 year old right-handed white male with a history of Parkinson's disease with associated dementia.  He has had a lot of hallucinations and delusional thinking that create anxiety for him.  He is on a relatively high  dose of Celexa, he remains on Nuplazid and Seroquel without full control of his hallucinations and delusions.  He walks with a walker, he may fall on occasion.  He currently is residing at [facility].  He is having some frequent urinary and fecal incontinence, he may go several days without having a bowel movement have significant constipation or even impaction issues.  The patient comes in today with his son.  The patient oftentimes sleeps off and on throughout the day, but they are waking him up every 2 hours at night to check for incontinence issues.>>   His Past Medical History Is Significant For: Past Medical History:  Diagnosis Date   Abdominal pain, right lower quadrant    Abnormality of gait 05/16/2016   Acute prostatitis    Anxiety    Constipation    Degenerative disc disease    Depression    Diverticulosis of colon (without mention of hemorrhage)    Essential and other specified forms of tremor    Family history of malignant neoplasm of gastrointestinal tract    Fibromyalgia    Ganglion and cyst of synovium, tendon, and bursa    GERD (gastroesophageal reflux disease)    Hemarthrosis, upper arm    Hyperlipidemia    Hypersomnia with sleep apnea, unspecified    Hypertension    Hypopotassemia    Localized osteoarthrosis not specified whether primary or secondary, lower leg    Lumbago    Memory difficulties  04/22/2013   Nocturia    Osteoporosis, unspecified    Other testicular hypofunction    Parkinson disease (HCC) 05/16/2016   Right inguinal hernia    Sleep apnea    last sleep study 11/11 on chart- Bipap with settings of 4 per  patient   Spinal stenosis, lumbar region, without neurogenic claudication    Syncope and collapse    Unspecified adverse effect of unspecified drug, medicinal and biological substance    Unspecified gastritis and gastroduodenitis without mention of hemorrhage    Urinary frequency     His Past Surgical History Is Significant  For: Past Surgical History:  Procedure Laterality Date   BACK SURGERY     x 3   cataract surgery Bilateral    HERNIA REPAIR  1965, 1968   INGUINAL HERNIA REPAIR  07/19/2011   Procedure: LAPAROSCOPIC INGUINAL HERNIA;  Surgeon: Adolph Pollack, MD;  Location: [facility];  Service: General;  Laterality: Right;  Laparoscopic Repair of Recurrent Right  Ingunial Hernia with Mesh   microdisectomy  03/02/2003, 06/29/2003, 12/24/2005   TONSILLECTOMY      His Family History Is Significant For: Family History  Problem Relation Age of Onset   Colon cancer Mother    Dementia Mother    Parkinsonism Father 5   Lung cancer Paternal Grandfather        smoker   Liver disease Neg Hx    Kidney disease Neg Hx    Esophageal cancer Neg Hx     His Social History Is Significant For: Social History   Socioeconomic History   Marital status: Widowed    Spouse name: Not on file   Number of children: 2   Years of education: college   Highest education level: Not on file  Occupational History   Occupation: retired    Associate Professor: RETIRED  Tobacco Use   Smoking status: Never   Smokeless tobacco: Never  Vaping Use   Vaping Use: Never used  Substance and Sexual Activity   Alcohol use: No    Alcohol/week: 0.0 standard drinks    Comment: less than 5 per week   Drug use: No   Sexual activity: Not on file  Other Topics Concern   Not on file  Social History Narrative   Lives at Facility, [facility] alone in an assisted living apartment    Patient is right handed.   Patient drinks 1 cup of caffeine per day.   Social Determinants of Health   Financial Resource Strain: Low Risk    Difficulty of Paying Living Expenses: Not hard at all  Food Insecurity: No Food Insecurity   Worried About Programme researcher, broadcasting/film/video in the Last Year: Never true   Ran Out of Food in the Last Year: Never true  Transportation Needs: No Transportation Needs   Lack of Transportation (Medical): No   Lack of Transportation  (Non-Medical): No  Physical Activity: Not on file  Stress: No Stress Concern Present   Feeling of Stress : Not at all  Social Connections: Moderately Isolated   Frequency of Communication with Friends and Family: Twice a week   Frequency of Social Gatherings with Friends and Family: Twice a week   Attends Religious Services: 1 to 4 times per year   Active Member of [employer] or Organizations: No   Attends Banker Meetings: Never   Marital Status: Widowed    His Allergies Are:  Allergies  Allergen Reactions   Nucynta [Tapentadol] Other (See Comments)  dizziness   Pregabalin     REACTION: dizziness and mental status changes   Exelon [Rivastigmine]     abd pain  :   His Current Medications Are:  Outpatient Encounter Medications as of 10/24/2021  Medication Sig   amLODipine (NORVASC) 10 MG tablet Take 1 tablet (10 mg total) by mouth daily.   Calcium Carbonate-Vitamin D (CALCIUM 600+D) 600-400 MG-UNIT tablet Take 1 tablet by mouth daily.   CALMOSEPTINE 0.44-20.6 % OINT APPLY TOPICALLY TO BUTTOCKS AND PERINEUM AFTER EACH INCONTINENCE EPISODE AND AFTER SHOWERS.   carbidopa-levodopa (SINEMET IR) 25-100 MG tablet Take 1 tablet by mouth 3 (three) times daily.   Cholecalciferol (VITAMIN D3) 125 MCG (5000 UT) CAPS Take 1 capsule by mouth daily.   citalopram (CELEXA) 40 MG tablet Take 1 tablet (40 mg total) by mouth daily.   entacapone (COMTAN) 200 MG tablet TAKE (1) TABLET BY MOUTH (3) TIMES DAILY.   famotidine (PEPCID) 20 MG tablet Take 1 tablet (20 mg total) by mouth 2 (two) times daily.   KLOR-CON M20 20 MEQ tablet TAKE 3 TABLETS (60 MEQ TOTAL) BY MOUTH DAILY.   Krill Oil 1000 MG CAPS Take 1 capsule by mouth 2 (two) times daily.   losartan (COZAAR) 100 MG tablet TAKE 1 TABLET (100 MG TOTAL) BY MOUTH DAILY.   memantine (NAMENDA) 10 MG tablet TAKE 1 TABLET (10 MG TOTAL) BY MOUTH 2 (TWO) TIMES DAILY.   NUPLAZID 34 MG CAPS TAKE (1) CAPSULE BY MOUTH ONCE DAILY.   polyethylene  glycol (MIRALAX / GLYCOLAX) 17 g packet Take 17 g by mouth daily. (Patient taking differently: Take 17 g by mouth every 3 (three) days.)   Probiotic Product (ALIGN) 4 MG CAPS Take by mouth daily.   QUEtiapine (SEROQUEL) 25 MG tablet 1 tablet at 2 pm   trimethoprim-polymyxin b (POLYTRIM) ophthalmic solution Place 2 drops into both eyes every 4 (four) hours. Placed 2 drops into both eyes every 4 hours while awake for 7 days   vitamin B-12 (CYANOCOBALAMIN) 50 MCG tablet Take 50 mcg by mouth daily.   No facility-administered encounter medications on file as of 10/24/2021.  :  Review of Systems:  Out of a complete 14 point review of systems, all are reviewed and negative with the exception of these symptoms as listed below:   Review of Systems  Neurological:        Here for f/u on Parkinson's pt reports since last visit he back and legs have been hurting more. 1 fall over the last 4 month which resulted a mild fracture in the right wrist. Has not heard from ortho yet on f/u for this.    Objective:  Neurological Exam  Physical Exam Physical Examination:   Vitals:   10/24/21 0900  BP: (!) 144/76  Pulse: (!) 59    General Examination: The patient is a very pleasant 76 y.o. male in no acute distress. He appears frail, well-groomed.    HEENT: Normocephalic, atraumatic, pupils are equal, round and reactive to light, extraocular tracking is impaired, mild to moderate nuchal rigidity noted.  Forward flexion of the neck noted, slight upper body tilt to the right.  No carotid bruits.  Corrective eyeglasses in place, hearing grossly intact.  Speech with mild hypophonia, no voice tremor.  No limp, or jaw tremor.   Oropharynx exam reveals: mild mouth dryness, tongue protrudes centrally and palate elevates symmetrically.    Chest: Clear to auscultation without wheezing, rhonchi or crackles noted.   Heart: S1+S2+0, regular and  normal without murmurs, rubs or gallops noted.    Abdomen: Soft, non-tender  and non-distended.   Extremities: There is 1+ pitting edema in the distal lower extremities bilaterally.    Skin: Warm and dry with chronic changes noted, chronic appearing bruises.     Musculoskeletal: exam reveals no obvious joint deformities, right hand swollen.   Neurologically:  Mental status: The patient is awake, alert and pays good attention.  His immediate and remote memory, attention, language skills and fund of knowledge are impaired.    MMSE - Mini Mental State Exam 01/25/2021 12/14/2019 09/15/2018 05/14/2018 03/31/2018 01/08/2018 06/03/2017  Not completed: - - - - (No Data) - -  Orientation to time $Remov'1 3 4 3 'jadJEb$ - 4 5  Orientation to Place $Remove'5 5 5 5 'rMBDuXX$ - 5 5  Registration $RemoveBefor'3 3 3 3 'gsiqsqJJLJer$ - 3 3  Attention/ Calculation 0 $RemoveBefor'1 2 3 'bHidGAisAHTA$ - 2 4  Recall $Remov'3 3 2 2 'tZMvKk$ - 2 3  Language- name 2 objects $RemoveB'1 2 2 2 'ujDtSHeJ$ - 2 2  Language- repeat $RemoveBeforeDE'1 1 1 1 'XzHMvcZoJalFXqN$ - 1 1  Language- follow 3 step command $RemoveBefo'1 3 3 3 'scTzBqCwPlk$ - 3 3  Language- read & follow direction $RemoveBeforeDE'1 1 1 1 'AkTCxtZetWHVQMp$ - 1 1  Write a sentence 0 $RemoveBe'1 1 1 'DkMdplSxx$ - 1 1  Copy design 0 0 0 0 - 1 1  Total score $RemoveBef'16 23 24 24 'AdtaFDurwd$ - 25 29    Mood is congruent and affect is normal.  Cranial nerves II - XII are as described above under HEENT exam.  Motor exam: Thin bulk, global strength of 4 out of 5, right hand not tested due to swelling and discomfort.  No obvious resting tremor.  Mild increase in tone throughout, fine motor skills are moderately impaired throughout, no obvious lateralization noted.   Cerebellar testing: No dysmetria or intention tremor. There is no truncal or gait ataxia.  Sensory exam: intact to light touch in the upper and lower extremities.  Gait, station and balance: He stands with difficulty and requires assistance, posture is moderate to severely stooped, also upper body leaning towards the right.  He walks with his U step walker but has trouble turning, stutter steps and freezing noted.     Assessment and Plan:    In summary, [name]. is a very pleasant 76 year old male with an underlying  medical history of reflux disease, hypertension, hyperlipidemia, back pain, sleep apnea, history of syncope, osteoporosis, fibromyalgia, depression, anxiety, memory loss parkinsonism, who presents for follow-up consultation of his parkinsonism, dementia, complicated by chronic constipation, delusions, hallucinations and falls.  He had a recent fall and injured his right wrist/hand.  They are advised to call his primary care to get an update on the orthopedics referral.  He has chronic low back pain and degenerative spine disease dating back 40 years ago per patient.  His memory symptoms date back to at least 2014 and he started having tremors around that time as well.  He had gait changes subsequently.  He was initially felt to have essential tremor and memory loss.  His son reports that he did not have much in the way of success with levodopa therapy.  One has to wonder if he has an atypical form of parkinsonism such as Lewy body dementia but it will be difficult to tease out at this time.  He does have evidence of hallucinations and delusions and is currently on Nuplazid as well as low-dose Seroquel.  He maintains on  levodopa 1 pill 3 times daily at this time as well as Comtan 3 times a day.  I did suggest we reduce his Celexa from 40 mg to 30 mg at this time.  We talked about the importance of supportive care, supervision and fall prevention again today.  He is advised to continue with his medication regimen otherwise and follow-up to see one of our nurse practitioners in 4 months in this clinic.  For his memory he is on generic Namenda 10 mg twice daily.  Of note, he had an intolerance to rivastigmine.  I suggested he continue with Nuplazid, low-dose Seroquel, Comtan and Sinemet as well, I adjusted his Celexa prescription to 30 mg daily.  I answered all the questions today and the patient and his son were in agreement. I spent 30 minutes in total face-to-face time and in reviewing records during pre-charting,  more than 50% of which was spent in counseling and coordination of care, reviewing test results, reviewing medications and treatment regimen and/or in discussing or reviewing the diagnosis of parkinsonism, memory loss, history of recent fall, the prognosis and treatment options. Pertinent laboratory and imaging test results that were available during this visit with the patient were reviewed by me and considered in my medical decision making (see chart for details).

## 2021-10-24 NOTE — Addendum Note (Signed)
Addended by: Nilda Riggs on: 10/24/2021 04:57 PM ? ? Modules accepted: Orders ? ?

## 2021-10-24 NOTE — Telephone Encounter (Signed)
Patient son, Aaron Edelman, called because nobody had heard anything from the [facility] office that the referral was supposed to be sent to. Aaron Edelman questioned if referral had been sent and I told him that Dr.Burchette had placed the referral on the 28th of February. I put Aaron Edelman on hold and contacted Margrett Rud, referral coordinator, to ask about the [facility] office details, which she provided. She confirmed that the referral had been sent on the 28th and she contacted the office on the 6th of March to get an update on the status of it, but received no answer and left a message, which was not responded to at this time.I relayed this information to Aaron Edelman and he expressed discomfort with the idea of sending his dad to [facility], since it had taken so long to respond on a urgent priority referral. He wanted it sent to another ortho office in [place], specifically [facility], but was concerned they were not in network with [facility]. I called CMA, Mykal, and relayed the situation so far to him. He stated that he would send in another urgent priority referral this evening to the referral coordinators for [facility] and referral coordinator would check. I let Aaron Edelman know everything and explained that it was the referral coordinators job to check if the offices take the insurance and to find another if the original does not take it. Aaron Edelman verbalized understanding. ?

## 2021-10-25 ENCOUNTER — Telehealth: Payer: Self-pay | Admitting: Family Medicine

## 2021-10-25 NOTE — Telephone Encounter (Signed)
After patient's last appointment, his son Dorsie Burich, stated that Dr.Burchette agreed to take him on as new patient while they were speaking in the exam room. Okay to schedule?  ? ? ? ? ? ?Please advise  ?

## 2021-10-25 NOTE — Telephone Encounter (Signed)
New referral has been placed for the pt per preference to [facility]  ?

## 2021-10-26 DIAGNOSIS — S52571A Other intraarticular fracture of lower end of right radius, initial encounter for closed fracture: Secondary | ICD-10-CM | POA: Diagnosis not present

## 2021-11-20 ENCOUNTER — Telehealth: Payer: Self-pay | Admitting: Family Medicine

## 2021-11-20 DIAGNOSIS — W19XXXA Unspecified fall, initial encounter: Secondary | ICD-10-CM

## 2021-11-20 DIAGNOSIS — R102 Pelvic and perineal pain: Secondary | ICD-10-CM | POA: Diagnosis not present

## 2021-11-20 NOTE — Telephone Encounter (Signed)
X ray has been ordered and faxed  ?

## 2021-11-20 NOTE — Telephone Encounter (Signed)
[facility] is calling and pt had a fall last night and hurt his hip and would like order to have mobile unit  come out and do xray .Please fax to [facility] [phone] ?

## 2021-11-22 ENCOUNTER — Telehealth: Payer: Self-pay | Admitting: Family Medicine

## 2021-11-22 DIAGNOSIS — N390 Urinary tract infection, site not specified: Secondary | ICD-10-CM | POA: Diagnosis not present

## 2021-11-22 DIAGNOSIS — G20 Parkinson's disease: Secondary | ICD-10-CM

## 2021-11-22 NOTE — Telephone Encounter (Signed)
[name] Corning RN called to see if any provider at all could sign to be the attending provider for patient. Verbal orders are okay, as well as placing them in epic as she can see them. Son is in agreement that patient needs to be placed in hospice tomorrow due to decline.  ? ? ?She is also needing the office notes from the last office visit faxed over. ? ?Fax [phone] ? ? ?Cellphone for verbal orders [phone] ? ? ? ?

## 2021-11-22 NOTE — Telephone Encounter (Signed)
Senita called back and verbal is ok ?

## 2021-11-22 NOTE — Telephone Encounter (Signed)
Please advise if okay for verbal order. ?

## 2021-11-22 NOTE — Telephone Encounter (Signed)
Senita with [facility] [place] is calling and would like order for hospice fax to [phone] due to pt is declining and has stopped eating. Deanne Coffer is aware md out of office until 12-03-2021 ?

## 2021-11-23 NOTE — Telephone Encounter (Signed)
I placed order in Epic for Hospice and OK to give verbal order as well.   ?

## 2021-11-26 NOTE — Telephone Encounter (Signed)
I spoke with Levada Dy and she has been given verbal orders. Levada Dy reported the ot recently had a fall and broke his hip so he was admitted for this. Levada Dy also stated that she had Marco Collie MD act as attending due to her not being able to contact the office during our closing. Levada Dy states that she can have attending MD changed after being given verbal orders.  ?

## 2021-12-16 DIAGNOSIS — M25552 Pain in left hip: Secondary | ICD-10-CM | POA: Diagnosis not present

## 2021-12-16 DIAGNOSIS — M79652 Pain in left thigh: Secondary | ICD-10-CM | POA: Diagnosis not present

## 2021-12-27 ENCOUNTER — Other Ambulatory Visit: Payer: Self-pay | Admitting: Family Medicine

## 2021-12-27 NOTE — Telephone Encounter (Signed)
Rx done. 

## 2022-01-06 ENCOUNTER — Other Ambulatory Visit: Payer: Self-pay

## 2022-01-06 ENCOUNTER — Emergency Department (HOSPITAL_COMMUNITY): Payer: PPO

## 2022-01-06 ENCOUNTER — Inpatient Hospital Stay (HOSPITAL_COMMUNITY)
Admission: EM | Admit: 2022-01-06 | Discharge: 2022-01-12 | DRG: 602 | Disposition: A | Discharge status: Skilled Nursing Facility | Payer: PPO | Source: Skilled Nursing Facility | Attending: Family Medicine | Admitting: Family Medicine

## 2022-01-06 ENCOUNTER — Encounter (HOSPITAL_COMMUNITY): Payer: Self-pay

## 2022-01-06 DIAGNOSIS — K219 Gastro-esophageal reflux disease without esophagitis: Secondary | ICD-10-CM | POA: Diagnosis present

## 2022-01-06 DIAGNOSIS — Z6820 Body mass index (BMI) 20.0-20.9, adult: Secondary | ICD-10-CM

## 2022-01-06 DIAGNOSIS — R54 Age-related physical debility: Secondary | ICD-10-CM | POA: Diagnosis present

## 2022-01-06 DIAGNOSIS — I959 Hypotension, unspecified: Secondary | ICD-10-CM | POA: Diagnosis not present

## 2022-01-06 DIAGNOSIS — M48061 Spinal stenosis, lumbar region without neurogenic claudication: Secondary | ICD-10-CM | POA: Diagnosis present

## 2022-01-06 DIAGNOSIS — R600 Localized edema: Secondary | ICD-10-CM | POA: Diagnosis not present

## 2022-01-06 DIAGNOSIS — M7989 Other specified soft tissue disorders: Secondary | ICD-10-CM | POA: Diagnosis not present

## 2022-01-06 DIAGNOSIS — E440 Moderate protein-calorie malnutrition: Secondary | ICD-10-CM | POA: Diagnosis present

## 2022-01-06 DIAGNOSIS — Z801 Family history of malignant neoplasm of trachea, bronchus and lung: Secondary | ICD-10-CM | POA: Diagnosis not present

## 2022-01-06 DIAGNOSIS — F0280 Dementia in other diseases classified elsewhere without behavioral disturbance: Secondary | ICD-10-CM | POA: Diagnosis present

## 2022-01-06 DIAGNOSIS — F32A Depression, unspecified: Secondary | ICD-10-CM | POA: Diagnosis present

## 2022-01-06 DIAGNOSIS — E872 Acidosis, unspecified: Secondary | ICD-10-CM | POA: Diagnosis present

## 2022-01-06 DIAGNOSIS — F03B0 Unspecified dementia, moderate, without behavioral disturbance, psychotic disturbance, mood disturbance, and anxiety: Secondary | ICD-10-CM | POA: Diagnosis not present

## 2022-01-06 DIAGNOSIS — G20 Parkinson's disease: Secondary | ICD-10-CM | POA: Diagnosis present

## 2022-01-06 DIAGNOSIS — L89152 Pressure ulcer of sacral region, stage 2: Secondary | ICD-10-CM | POA: Diagnosis present

## 2022-01-06 DIAGNOSIS — Z7401 Bed confinement status: Secondary | ICD-10-CM | POA: Diagnosis not present

## 2022-01-06 DIAGNOSIS — R4182 Altered mental status, unspecified: Secondary | ICD-10-CM | POA: Diagnosis not present

## 2022-01-06 DIAGNOSIS — L89619 Pressure ulcer of right heel, unspecified stage: Secondary | ICD-10-CM | POA: Diagnosis present

## 2022-01-06 DIAGNOSIS — M25461 Effusion, right knee: Secondary | ICD-10-CM | POA: Diagnosis not present

## 2022-01-06 DIAGNOSIS — E785 Hyperlipidemia, unspecified: Secondary | ICD-10-CM | POA: Diagnosis present

## 2022-01-06 DIAGNOSIS — L89613 Pressure ulcer of right heel, stage 3: Secondary | ICD-10-CM | POA: Diagnosis not present

## 2022-01-06 DIAGNOSIS — Z79899 Other long term (current) drug therapy: Secondary | ICD-10-CM

## 2022-01-06 DIAGNOSIS — L0291 Cutaneous abscess, unspecified: Secondary | ICD-10-CM | POA: Diagnosis not present

## 2022-01-06 DIAGNOSIS — Z8781 Personal history of (healed) traumatic fracture: Secondary | ICD-10-CM

## 2022-01-06 DIAGNOSIS — I10 Essential (primary) hypertension: Secondary | ICD-10-CM | POA: Diagnosis present

## 2022-01-06 DIAGNOSIS — K5909 Other constipation: Secondary | ICD-10-CM | POA: Diagnosis present

## 2022-01-06 DIAGNOSIS — E876 Hypokalemia: Secondary | ICD-10-CM | POA: Diagnosis present

## 2022-01-06 DIAGNOSIS — Z0389 Encounter for observation for other suspected diseases and conditions ruled out: Secondary | ICD-10-CM | POA: Diagnosis not present

## 2022-01-06 DIAGNOSIS — M810 Age-related osteoporosis without current pathological fracture: Secondary | ICD-10-CM | POA: Diagnosis present

## 2022-01-06 DIAGNOSIS — Z820 Family history of epilepsy and other diseases of the nervous system: Secondary | ICD-10-CM

## 2022-01-06 DIAGNOSIS — L8995 Pressure ulcer of unspecified site, unstageable: Secondary | ICD-10-CM | POA: Diagnosis not present

## 2022-01-06 DIAGNOSIS — L89513 Pressure ulcer of right ankle, stage 3: Secondary | ICD-10-CM | POA: Diagnosis present

## 2022-01-06 DIAGNOSIS — L89519 Pressure ulcer of right ankle, unspecified stage: Secondary | ICD-10-CM | POA: Diagnosis present

## 2022-01-06 DIAGNOSIS — L02415 Cutaneous abscess of right lower limb: Secondary | ICD-10-CM | POA: Diagnosis present

## 2022-01-06 DIAGNOSIS — Z800 Family history of malignant neoplasm of digestive organs: Secondary | ICD-10-CM | POA: Diagnosis not present

## 2022-01-06 DIAGNOSIS — W19XXXA Unspecified fall, initial encounter: Secondary | ICD-10-CM | POA: Diagnosis not present

## 2022-01-06 DIAGNOSIS — Z66 Do not resuscitate: Secondary | ICD-10-CM | POA: Diagnosis present

## 2022-01-06 DIAGNOSIS — L8990 Pressure ulcer of unspecified site, unspecified stage: Secondary | ICD-10-CM | POA: Insufficient documentation

## 2022-01-06 DIAGNOSIS — Z888 Allergy status to other drugs, medicaments and biological substances status: Secondary | ICD-10-CM

## 2022-01-06 DIAGNOSIS — R413 Other amnesia: Secondary | ICD-10-CM | POA: Diagnosis present

## 2022-01-06 DIAGNOSIS — R296 Repeated falls: Secondary | ICD-10-CM | POA: Diagnosis present

## 2022-01-06 LAB — CBC WITH DIFFERENTIAL/PLATELET
Abs Immature Granulocytes: 0.05 10*3/uL (ref 0.00–0.07)
Basophils Absolute: 0 10*3/uL (ref 0.0–0.1)
Basophils Relative: 0 %
Eosinophils Absolute: 0 10*3/uL (ref 0.0–0.5)
Eosinophils Relative: 0 %
HCT: 32.3 % — ABNORMAL LOW (ref 39.0–52.0)
Hemoglobin: 11.1 g/dL — ABNORMAL LOW (ref 13.0–17.0)
Immature Granulocytes: 0 %
Lymphocytes Relative: 5 %
Lymphs Abs: 0.6 10*3/uL — ABNORMAL LOW (ref 0.7–4.0)
MCH: 32 pg (ref 26.0–34.0)
MCHC: 34.4 g/dL (ref 30.0–36.0)
MCV: 93.1 fL (ref 80.0–100.0)
Monocytes Absolute: 0.6 10*3/uL (ref 0.1–1.0)
Monocytes Relative: 5 %
Neutro Abs: 10.1 10*3/uL — ABNORMAL HIGH (ref 1.7–7.7)
Neutrophils Relative %: 90 %
Platelets: 362 10*3/uL (ref 150–400)
RBC: 3.47 MIL/uL — ABNORMAL LOW (ref 4.22–5.81)
RDW: 14.1 % (ref 11.5–15.5)
WBC: 11.4 10*3/uL — ABNORMAL HIGH (ref 4.0–10.5)
nRBC: 0 % (ref 0.0–0.2)

## 2022-01-06 LAB — C-REACTIVE PROTEIN: CRP: 13.7 mg/dL — ABNORMAL HIGH (ref <1.0)

## 2022-01-06 LAB — LACTIC ACID, PLASMA
Lactic Acid, Venous: 1.9 mmol/L (ref 0.5–1.9)
Lactic Acid, Venous: 2.4 mmol/L (ref 0.5–1.9)
Lactic Acid, Venous: 1.2 mmol/L (ref 0.5–1.9)

## 2022-01-06 LAB — COMPREHENSIVE METABOLIC PANEL
ALT: 10 U/L (ref 0–44)
AST: 14 U/L — ABNORMAL LOW (ref 15–41)
Albumin: 2.5 g/dL — ABNORMAL LOW (ref 3.5–5.0)
Alkaline Phosphatase: 135 U/L — ABNORMAL HIGH (ref 38–126)
Anion gap: 7 (ref 5–15)
BUN: 15 mg/dL (ref 8–23)
CO2: 34 mmol/L — ABNORMAL HIGH (ref 22–32)
Calcium: 9 mg/dL (ref 8.9–10.3)
Chloride: 102 mmol/L (ref 98–111)
Creatinine, Ser: 0.59 mg/dL — ABNORMAL LOW (ref 0.61–1.24)
GFR, Estimated: >60 mL/min (ref >60)
Glucose, Bld: 144 mg/dL — ABNORMAL HIGH (ref 70–99)
Potassium: 2.8 mmol/L — ABNORMAL LOW (ref 3.5–5.1)
Sodium: 143 mmol/L (ref 135–145)
Total Bilirubin: 0.6 mg/dL (ref 0.3–1.2)
Total Protein: 6.8 g/dL (ref 6.5–8.1)

## 2022-01-06 LAB — SEDIMENTATION RATE: Sed Rate: 98 mm/hr — ABNORMAL HIGH (ref 0–16)

## 2022-01-06 LAB — MAGNESIUM: Magnesium: 2 mg/dL (ref 1.7–2.4)

## 2022-01-06 MED ORDER — OXYCODONE HCL 5 MG PO TABS: 5.0000 mg | ORAL_TABLET | Freq: Four times a day (QID) | ORAL | Status: DC | PRN

## 2022-01-06 MED ORDER — VANCOMYCIN HCL 1500 MG/300ML IV SOLN
1500.0000 mg | INTRAVENOUS | Status: DC
  Administered 2022-01-06 – 2022-01-10 (×5): 1500 mg via INTRAVENOUS
  Filled 2022-01-06 (×5): qty 300

## 2022-01-06 MED ORDER — VANCOMYCIN HCL IN DEXTROSE 1-5 GM/200ML-% IV SOLN
1000.0000 mg | Freq: Once | INTRAVENOUS | Status: AC
  Administered 2022-01-06: 1000 mg via INTRAVENOUS
  Filled 2022-01-06: qty 200

## 2022-01-06 MED ORDER — LACTATED RINGERS IV BOLUS
1000.0000 mL | Freq: Once | INTRAVENOUS | Status: AC
  Administered 2022-01-06: 1000 mL via INTRAVENOUS

## 2022-01-06 MED ORDER — SODIUM CHLORIDE 0.9 % IV SOLN
INTRAVENOUS | Status: DC
Start: 2022-01-06 — End: 2022-01-09

## 2022-01-06 MED ORDER — VANCOMYCIN HCL IN DEXTROSE 1-5 GM/200ML-% IV SOLN: 1000.0000 mg | Freq: Once | INTRAVENOUS | Status: DC

## 2022-01-06 MED ORDER — ACETAMINOPHEN 325 MG PO TABS
650.0000 mg | ORAL_TABLET | Freq: Once | ORAL | Status: AC
  Administered 2022-01-06: 650 mg via ORAL
  Filled 2022-01-06: qty 2

## 2022-01-06 MED ORDER — POTASSIUM CHLORIDE CRYS ER 20 MEQ PO TBCR
40.0000 meq | EXTENDED_RELEASE_TABLET | Freq: Once | ORAL | Status: AC
  Administered 2022-01-06: 40 meq via ORAL
  Filled 2022-01-06: qty 2

## 2022-01-06 MED ORDER — ENOXAPARIN SODIUM 40 MG/0.4ML IJ SOSY
40.0000 mg | PREFILLED_SYRINGE | INTRAMUSCULAR | Status: DC
  Administered 2022-01-06 – 2022-01-11 (×6): 40 mg via SUBCUTANEOUS
  Filled 2022-01-06 (×6): qty 0.4

## 2022-01-06 MED ORDER — ENOXAPARIN SODIUM 40 MG/0.4ML IJ SOSY: 40.0000 mg | PREFILLED_SYRINGE | INTRAMUSCULAR | Status: DC

## 2022-01-06 MED ORDER — POTASSIUM CHLORIDE CRYS ER 20 MEQ PO TBCR
40.0000 meq | EXTENDED_RELEASE_TABLET | ORAL | Status: AC
  Administered 2022-01-06 (×2): 40 meq via ORAL
  Filled 2022-01-06 (×2): qty 2

## 2022-01-06 MED ORDER — MAGNESIUM OXIDE -MG SUPPLEMENT 400 (240 MG) MG PO TABS
800.0000 mg | ORAL_TABLET | Freq: Once | ORAL | Status: AC
  Administered 2022-01-06: 800 mg via ORAL
  Filled 2022-01-06: qty 2

## 2022-01-06 NOTE — Progress Notes (Signed)
Dr. Rodena Piety aware pt's temp 100.4 and lab called Lactic Acid of 2.4 to me recently.

## 2022-01-06 NOTE — Assessment & Plan Note (Signed)
Replete potassium Check mag level Recheck labs in a.m.

## 2022-01-06 NOTE — Assessment & Plan Note (Signed)
Supportive treatment

## 2022-01-06 NOTE — ED Triage Notes (Signed)
[facility]  reports pt coming from [facility] in [place]. Staff states pt has two large abscesses on right posterior calf and heal. Staff drained today and doctor wanted pt checked for sepsis. Pt w/femoral head fx to right side about 6 weeks ago and bed bound since. Son enroute to hospital.

## 2022-01-06 NOTE — Progress Notes (Signed)
Lab called critical lactic acid of 2.4 

## 2022-01-06 NOTE — ED Notes (Signed)
Son [name] called for update and advised they would be coming to see pt.

## 2022-01-06 NOTE — Progress Notes (Signed)
Pharmacy Antibiotic Note  [name]. is a 76 y.o. male admitted on 01/06/2022 with R leg abscess. Previously treated with oral abx at facility for R leg cellulitis, and then again after developing abscess. I&D performed at [facility]. Pharmacy has been consulted for vancomycin dosing.   Plan: Vancomycin 1000 mg IV now, then 1500 mg IV q24 hr (est AUC 513 based on SCr 0.8; Vd 0.72) Measure vancomycin AUC at steady state as indicated SCr q48 while on vanc.   Height: '5\' 9"'$  (175.3 cm) Weight: 64.4 kg (142 lb) IBW/kg (Calculated) : 70.7  Temp (24hrs), Avg:99.3 F (37.4 C), Min:98.1 F (36.7 C), Max:100.4 F (38 C)  Recent Labs  Lab 01/06/22 1326  WBC 11.4*  CREATININE 0.59*  LATICACIDVEN 1.9    Estimated Creatinine Clearance: 71.6 mL/min (A) (by C-G formula based on SCr of 0.59 mg/dL (L)).    Allergies  Allergen Reactions   Nucynta [Tapentadol] Other (See Comments)    dizziness   Pregabalin     REACTION: dizziness and mental status changes   Exelon [Rivastigmine]     abd pain    Thank you for allowing pharmacy to be a part of this patient's care.  [name] A 01/06/2022 5:28 PM

## 2022-01-06 NOTE — Assessment & Plan Note (Signed)
Blood pressure soft hold antihypertensives

## 2022-01-06 NOTE — Consult Note (Signed)
WOC Nurse Consult Note: Reason for Consult: Chronic, nonhealing full thickness wound draining purulent exudate while at the SNF. Drained by Hospice RN x2 and areas continues to refill with purulent drainage.  Wound type: infectious Pressure Injury POA: Yes Drainage (amount, consistency, odor): purulent with odor Periwound:erythematous, edematous Dressing procedure/placement/frequency: I have discussed with Dr. Rodena Piety via [facility] and recommended Podiatric Medicine consult for this wound. While on Hospice benefit, antibiotics are still being administered. I feel that Podiatry would be best able to give specialty insight to next steps for the management of this wound. She indicates that she will consult them in the morning.  I will provide interim dressing orders using a soap and water cleanse, NS rinse and topical dressing application of xeroform gauze for antimicrobial nonadherent care. This will be covered with dry gauze, an ABD and secured with Kerlix roll gauze/paper tape. The foot (feet) are to be placed into Pressure redistribution heel boots (Prevalon) for PI prevention. Turning and repositioning are in place. A sacral prophylactic foam dressing is to be placed to the sacrum.   [facility] nursing team will not follow, but will remain available to this patient, the nursing and medical teams.  Please re-consult if needed. Thanks, Maudie Flakes, MSN, RN, [facility], [facility]  Pager# [phone]

## 2022-01-06 NOTE — ED Notes (Signed)
ED TO INPATIENT HANDOFF REPORT  ED Nurse Name and Phone #: Baxter Flattery, RN  S Name/Age/Gender [name]. 76 y.o. male Room/Bed: WA09/WA09  Code Status   Code Status: DNR  Home/SNF/Other Skilled nursing facility Patient oriented to: self and place Is this baseline? Yes   Triage Complete: Triage complete  Chief Complaint Abscess [L02.91]  Triage Note [facility]  reports pt coming from [facility] in [place]. Staff states pt has two large abscesses on right posterior calf and heal. Staff drained today and doctor wanted pt checked for sepsis. Pt w/femoral head fx to right side about 6 weeks ago and bed bound since. Son enroute to hospital.   Allergies Allergies  Allergen Reactions   Nucynta [Tapentadol] Other (See Comments)    dizziness   Pregabalin     REACTION: dizziness and mental status changes   Exelon [Rivastigmine]     abd pain    Level of Care/Admitting Diagnosis ED Disposition     ED Disposition  Admit   Condition  --   [facility]: [place] [[id number]]  Level of Care: Med-Surg [16]  May place patient in observation at [facility] or [facility] if equivalent level of care is available:: Yes  Covid Evaluation: Asymptomatic - no recent exposure (last 10 days) testing not required  Diagnosis: Abscess [517616]  Admitting Physician: Georgette Shell [[id number]]  Attending Physician: Georgette Shell [[id number]]          B Medical/Surgery History Past Medical History:  Diagnosis Date   Abdominal pain, right lower quadrant    Abnormality of gait 05/16/2016   Acute prostatitis    Anxiety    Constipation    Degenerative disc disease    Depression    Diverticulosis of colon (without mention of hemorrhage)    Essential and other specified forms of tremor    Family history of malignant neoplasm of gastrointestinal tract    Fibromyalgia    Ganglion and cyst of synovium, tendon, and bursa    GERD (gastroesophageal reflux  disease)    Hemarthrosis, upper arm    Hyperlipidemia    Hypersomnia with sleep apnea, unspecified    Hypertension    Hypopotassemia    Localized osteoarthrosis not specified whether primary or secondary, lower leg    Lumbago    Memory difficulties 04/22/2013   Nocturia    Osteoporosis, unspecified    Other testicular hypofunction    Parkinson disease ([place]) 05/16/2016   Right inguinal hernia    Sleep apnea    last sleep study 11/11 on chart- Bipap with settings of 4 per  patient   Spinal stenosis, lumbar region, without neurogenic claudication    Syncope and collapse    Unspecified adverse effect of unspecified drug, medicinal and biological substance    Unspecified gastritis and gastroduodenitis without mention of hemorrhage    Urinary frequency    Past Surgical History:  Procedure Laterality Date   BACK SURGERY     x 3   cataract surgery Bilateral    [place]  07/19/2011   Procedure: LAPAROSCOPIC INGUINAL HERNIA;  Surgeon: Odis Hollingshead, MD;  Location: [facility];  Service: General;  Laterality: Right;  Laparoscopic Repair of Recurrent Right  Ingunial Hernia with Mesh   microdisectomy  03/02/2003, 06/29/2003, 12/24/2005   TONSILLECTOMY       A IV Location/Drains/Wounds Patient Lines/Drains/Airways Status     Active Line/Drains/Airways  Name Placement date Placement time Site Days   Peripheral IV 01/06/22 20 G 1" Anterior;Distal;Left;Upper Arm 01/06/22  1600  Arm  less than 1   Incision 07/19/11 Abdomen Other (Comment) 07/19/11  1337  -- 3824   Incision - 3 Ports Abdomen 1: Umbilicus 2: Anterior;Medial;Lower 3: Anterior;Medial;Lower 07/19/11  1255  -- 3824            Intake/Output Last 24 hours No intake or output data in the 24 hours ending 01/06/22 1618  Labs/Imaging Results for orders placed or performed during the hospital encounter of 01/06/22 (from the past 48 hour(s))  Comprehensive metabolic panel     Status:  Abnormal   Collection Time: 01/06/22  1:26 PM  Result Value Ref Range   Sodium 143 135 - 145 mmol/L   Potassium 2.8 (L) 3.5 - 5.1 mmol/L   Chloride 102 98 - 111 mmol/L   CO2 34 (H) 22 - 32 mmol/L   Glucose, Bld 144 (H) 70 - 99 mg/dL    Comment: Glucose reference range applies only to samples taken after fasting for at least 8 hours.   BUN 15 8 - 23 mg/dL   Creatinine, Ser 0.59 (L) 0.61 - 1.24 mg/dL   Calcium 9.0 8.9 - 10.3 mg/dL   Total Protein 6.8 6.5 - 8.1 g/dL   Albumin 2.5 (L) 3.5 - 5.0 g/dL   AST 14 (L) 15 - 41 U/L   ALT 10 0 - 44 U/L   Alkaline Phosphatase 135 (H) 38 - 126 U/L   Total Bilirubin 0.6 0.3 - 1.2 mg/dL   GFR, Estimated >60 >60 mL/min    Comment: (NOTE) Calculated using the CKD-EPI Creatinine Equation (2021)    Anion gap 7 5 - 15    Comment: Performed at [facility], [place] [address] [place], [place] [address]  CBC with Differential     Status: Abnormal   Collection Time: 01/06/22  1:26 PM  Result Value Ref Range   WBC 11.4 (H) 4.0 - 10.5 K/uL   RBC 3.47 (L) 4.22 - 5.81 MIL/uL   Hemoglobin 11.1 (L) 13.0 - 17.0 g/dL   HCT 32.3 (L) 39.0 - 52.0 %   MCV 93.1 80.0 - 100.0 fL   MCH 32.0 26.0 - 34.0 pg   MCHC 34.4 30.0 - 36.0 g/dL   RDW 14.1 11.5 - 15.5 %   Platelets 362 150 - 400 K/uL   nRBC 0.0 0.0 - 0.2 %   Neutrophils Relative % 90 %   Neutro Abs 10.1 (H) 1.7 - 7.7 K/uL   Lymphocytes Relative 5 %   Lymphs Abs 0.6 (L) 0.7 - 4.0 K/uL   Monocytes Relative 5 %   Monocytes Absolute 0.6 0.1 - 1.0 K/uL   Eosinophils Relative 0 %   Eosinophils Absolute 0.0 0.0 - 0.5 K/uL   Basophils Relative 0 %   Basophils Absolute 0.0 0.0 - 0.1 K/uL   Immature Granulocytes 0 %   Abs Immature Granulocytes 0.05 0.00 - 0.07 K/uL    Comment: Performed at [facility], [place] [address], [place], [place] [address]  Lactic acid, plasma     Status: None   Collection Time: 01/06/22  1:26 PM  Result Value Ref Range   Lactic Acid, Venous 1.9 0.5 -  1.9 mmol/L    Comment: Performed at [facility], [place] [address], [place], [place] [address]   DG Tibia/Fibula Right  Result Date: 01/06/2022 CLINICAL DATA:  Calf abscesses drain  today. EXAM: RIGHT TIBIA AND FIBULA - 2 VIEW COMPARISON:  None Available. FINDINGS: There is gas in the MEDIAL soft tissues likely representing the known abscess which was drained today. No fracture, subluxation or dislocation identified. No knee effusion is present. No focal bony lesions or evidence of acute osteomyelitis noted. IMPRESSION: MEDIAL soft tissue gas likely representing the known abscess which was drained today. No other significant abnormalities. Electronically Signed   By: Margarette Canada M.D.   On: 01/06/2022 14:26    Pending Labs Unresulted Labs (From admission, onward)     Start     Ordered   01/13/22 0500  Creatinine, serum  (enoxaparin (LOVENOX)    CrCl >/= 30 ml/min)  Weekly,   R     Comments: while on enoxaparin therapy    01/06/22 1611   01/07/22 0500  Comprehensive metabolic panel  Tomorrow morning,   R        01/06/22 1603   01/07/22 0500  CBC  Tomorrow morning,   R        01/06/22 1603   01/06/22 1618  Aerobic/Anaerobic Culture w Gram Stain (surgical/deep wound)  Once,   R        01/06/22 1617   01/06/22 1610  CBC  (enoxaparin (LOVENOX)    CrCl >/= 30 ml/min)  Once,   R       Comments: Baseline for enoxaparin therapy IF NOT ALREADY DRAWN.  Notify MD if PLT < 100 K.    01/06/22 1611   01/06/22 1610  Creatinine, serum  (enoxaparin (LOVENOX)    CrCl >/= 30 ml/min)  Once,   R       Comments: Baseline for enoxaparin therapy IF NOT ALREADY DRAWN.    01/06/22 1611   01/06/22 1421  Sedimentation rate  Once,   URGENT        01/06/22 1420   01/06/22 1421  C-reactive protein  Once,   URGENT        01/06/22 1420   01/06/22 1317  Blood culture (routine x 2)  BLOOD CULTURE X 2,   R (with STAT occurrences)      01/06/22 1317   01/06/22 1317  Lactic acid, plasma  Now then every 2  hours,   R (with STAT occurrences)      01/06/22 1317            Vitals/Pain Today's Vitals   01/06/22 1300 01/06/22 1330 01/06/22 1423 01/06/22 1533  BP: (!) 115/57 (!) 113/59 123/61 98/85  Pulse: (!) 56 (!) 59 (!) 59 61  Resp: '11 10 12 '$ (!) 22  Temp:      TempSrc:      SpO2: 100% 100% 100% 98%  Weight:      Height:      PainSc:        Isolation Precautions No active isolations  Medications Medications  vancomycin (VANCOCIN) IVPB 1000 mg/200 mL premix (1,000 mg Intravenous New Bag/Given 01/06/22 1609)  potassium chloride SA (KLOR-CON M) CR tablet 40 mEq (has no administration in time range)  0.9 %  sodium chloride infusion (has no administration in time range)  enoxaparin (LOVENOX) injection 40 mg (has no administration in time range)  potassium chloride SA (KLOR-CON M) CR tablet 40 mEq (40 mEq Oral Given 01/06/22 1426)  magnesium oxide (MAG-OX) tablet 800 mg (800 mg Oral Given 01/06/22 1425)  lactated ringers bolus 1,000 mL (1,000 mLs Intravenous New Bag/Given 01/06/22 1607)    Mobility walks with device High  fall risk   Focused Assessments Cardiac Assessment Handoff:    Lab Results  Component Value Date   CKTOTAL 56 04/19/2008   No results found for: DDIMER Does the Patient currently have chest pain? No    R Recommendations: See Admitting Provider Note  Report given to:   Additional Notes:

## 2022-01-06 NOTE — H&P (Addendum)
History and Physical    [name]. VOZ:[id number] DOB: 07-11-1946 DOA: 01/06/2022  PCP: Eulas Post, MD Patient coming from: Nursing home  Chief Complaint: Abscess on the right leg  HPI: [name] Reddy. is a 76 y.o. male with medical history significant of Parkinson's disease, dementia he was sent in by the staff concerning abscess on his right posterior aspect of his right leg and heal  This was drained by hospice nurse at the facility and the ED physician in the ER checked on it and was still draining pus.  Patient had a fall or had multiple falls prior to admission and had a femoral head fracture about 6 weeks ago and has been bedbound since.  No fever chills nausea vomiting diarrhea no cough or shortness of breath he has been gradually declining since his hip fracture is a hospice consult was placed last month and he is followed by [facility]. Patient recently had right lower extremity cellulitis which was treated with oral antibiotics.  Later he developed abscess and completed a 5-day course of antibiotics. He had came in nature to mildly impacted intra-articular fracture of the distal radius in February 2023 His regular meds were stopped after enrolling in hospice  ED Course: received ivf vancomycin K was 2.7 WBC 11.4 BP soft Not tachycardic not febrile X-ray shows medial soft tissue gas likely representing known abscess which was drained today no other abnormalities.  X-ray of the right tibia and fibula. Review of Systems: As per HPI otherwise all other systems reviewed and are negative  Ambulatory Status: Bedbound since hip fracture 6 weeks ago  Past Medical History:  Diagnosis Date   Abdominal pain, right lower quadrant    Abnormality of gait 05/16/2016   Acute prostatitis    Anxiety    Constipation    Degenerative disc disease    Depression    Diverticulosis of colon (without mention of hemorrhage)    Essential and other specified forms of tremor     Family history of malignant neoplasm of gastrointestinal tract    Fibromyalgia    Ganglion and cyst of synovium, tendon, and bursa    GERD (gastroesophageal reflux disease)    Hemarthrosis, upper arm    Hyperlipidemia    Hypersomnia with sleep apnea, unspecified    Hypertension    Hypopotassemia    Localized osteoarthrosis not specified whether primary or secondary, lower leg    Lumbago    Memory difficulties 04/22/2013   Nocturia    Osteoporosis, unspecified    Other testicular hypofunction    Parkinson disease ([place]) 05/16/2016   Right inguinal hernia    Sleep apnea    last sleep study 11/11 on chart- Bipap with settings of 4 per  patient   Spinal stenosis, lumbar region, without neurogenic claudication    Syncope and collapse    Unspecified adverse effect of unspecified drug, medicinal and biological substance    Unspecified gastritis and gastroduodenitis without mention of hemorrhage    Urinary frequency     Past Surgical History:  Procedure Laterality Date   BACK SURGERY     x 3   cataract surgery Bilateral    [place]  07/19/2011   Procedure: LAPAROSCOPIC INGUINAL HERNIA;  Surgeon: Odis Hollingshead, MD;  Location: [facility];  Service: General;  Laterality: Right;  Laparoscopic Repair of Recurrent Right  Ingunial Hernia with Mesh   microdisectomy  03/02/2003, 06/29/2003, 12/24/2005  TONSILLECTOMY      Social History   Socioeconomic History   Marital status: Widowed    Spouse name: Not on file   Number of children: 2   Years of education: college   Highest education level: Not on file  Occupational History   Occupation: retired    Fish farm manager: RETIRED  Tobacco Use   Smoking status: Never   Smokeless tobacco: Never  Vaping Use   Vaping Use: Never used  Substance and Sexual Activity   Alcohol use: No    Alcohol/week: 0.0 standard drinks    Comment: less than 5 per week   Drug use: No   Sexual activity: Not on file  Other  Topics Concern   Not on file  Social History Narrative   Lives at [place], [place] alone in an assisted living apartment    Patient is right handed.   Patient drinks 1 cup of caffeine per day.   Social Determinants of Health   Financial Resource Strain: Low Risk    Difficulty of Paying Living Expenses: Not hard at all  Food Insecurity: No Food Insecurity   Worried About Charity fundraiser in the Last Year: Never true   [place] in the Last Year: Never true  Transportation Needs: No Transportation Needs   Lack of Transportation (Medical): No   Lack of Transportation (Non-Medical): No  Physical Activity: Not on file  Stress: No Stress Concern Present   Feeling of Stress : Not at all  Social Connections: Moderately Isolated   Frequency of Communication with Friends and Family: Twice a week   Frequency of Social Gatherings with Friends and Family: Twice a week   Attends Religious Services: 1 to 4 times per year   Active Member of [employer] or Organizations: No   Attends Archivist Meetings: Never   Marital Status: Widowed  Human resources officer Violence: Not At Risk   Fear of Current or Ex-Partner: No   Emotionally Abused: No   Physically Abused: No   Sexually Abused: No    Allergies  Allergen Reactions   Nucynta [Tapentadol] Other (See Comments)    dizziness   Pregabalin     REACTION: dizziness and mental status changes   Exelon [Rivastigmine]     abd pain    Family History  Problem Relation Age of Onset   Colon cancer Mother    Dementia Mother    Parkinsonism Father 24   Lung cancer Paternal Grandfather        smoker   Liver disease Neg Hx    Kidney disease Neg Hx    Esophageal cancer Neg Hx       Prior to Admission medications   Medication Sig Start Date End Date Taking? Authorizing Provider  acetaminophen (TYLENOL) 325 MG tablet Take 650 mg by mouth every 6 (six) hours as needed for moderate pain.   Yes [provider]  oxyCODONE (OXY  IR/ROXICODONE) 5 MG immediate release tablet Take 5 mg by mouth every 6 (six) hours as needed for pain. 12/24/21  Yes [provider]  PAIN RELIEF EXTRA STRENGTH 500 MG tablet Take 500 mg by mouth every 8 (eight) hours as needed for pain. 10/26/21  Yes [provider]  polyethylene glycol (MIRALAX / GLYCOLAX) 17 g packet Take 17 g by mouth daily. 05/16/20  Yes Kathrynn Ducking, MD  amLODipine (NORVASC) 10 MG tablet Take 1 tablet (10 mg total) by mouth daily. Patient not taking: Reported on 01/06/2022  08/10/19   Burchette, Alinda Sierras, MD  Calcium Carbonate-Vitamin D (CALCIUM 600+D) 600-400 MG-UNIT tablet Take 1 tablet by mouth daily. Patient not taking: Reported on 01/06/2022 04/10/18   Burchette, Alinda Sierras, MD  CALMOSEPTINE 0.44-20.6 % OINT APPLY TOPICALLY TO BUTTOCKS AND PERINEUM AFTER EACH INCONTINENCE EPISODE AND AFTER SHOWERS. Patient not taking: Reported on 01/06/2022 05/02/21   Eulas Post, MD  carbidopa-levodopa (SINEMET IR) 25-100 MG tablet Take 1 tablet by mouth 3 (three) times daily. Patient not taking: Reported on 01/06/2022 09/20/20   Kathrynn Ducking, MD  citalopram (CELEXA) 20 MG tablet Take 1.5 tablets (30 mg total) by mouth daily. Patient not taking: Reported on 01/06/2022 10/24/21   Star Age, MD  entacapone (COMTAN) 200 MG tablet TAKE (1) TABLET BY MOUTH (3) TIMES DAILY. Patient not taking: Reported on 01/06/2022 11/13/18   Kathrynn Ducking, MD  famotidine (PEPCID) 20 MG tablet Take 1 tablet (20 mg total) by mouth 2 (two) times daily. Patient not taking: Reported on 01/06/2022 12/14/18   Eulas Post, MD  KLOR-CON M20 20 MEQ tablet TAKE 3 TABLETS (60 MEQ TOTAL) BY MOUTH DAILY. Patient not taking: Reported on 01/06/2022 01/02/18   Eulas Post, MD  losartan (COZAAR) 100 MG tablet TAKE 1 TABLET (100 MG TOTAL) BY MOUTH DAILY. Patient not taking: Reported on 01/06/2022 09/16/17   Eulas Post, MD  memantine (NAMENDA) 10 MG tablet TAKE 1 TABLET (10 MG TOTAL) BY  MOUTH 2 (TWO) TIMES DAILY. Patient not taking: Reported on 01/06/2022 12/08/17   Kathrynn Ducking, MD  Pimavanserin Tartrate (NUPLAZID) 34 MG CAPS TAKE (1) CAPSULE BY MOUTH ONCE DAILY. Patient not taking: Reported on 01/06/2022 12/27/21   Eulas Post, MD  QUEtiapine (SEROQUEL) 25 MG tablet 1 tablet at 2 pm Patient not taking: Reported on 01/06/2022 11/07/20   Kathrynn Ducking, MD  trimethoprim-polymyxin b (POLYTRIM) ophthalmic solution Place 2 drops into both eyes every 4 (four) hours. Placed 2 drops into both eyes every 4 hours while awake for 7 days Patient not taking: Reported on 01/06/2022 08/15/20   Eulas Post, MD    Physical Exam: Vitals:   01/06/22 1300 01/06/22 1330 01/06/22 1423 01/06/22 1533  BP: (!) 115/57 (!) 113/59 123/61 98/85  Pulse: (!) 56 (!) 59 (!) 59 61  Resp: '11 10 12 '$ (!) 22  Temp:      TempSrc:      SpO2: 100% 100% 100% 98%  Weight:      Height:         General:  Appears frail elderly  Eyes:  PERRL, EOMI, normal lids, iris ENT:  grossly normal hearing, lips & tongue, mmm Neck:  no LAD, masses or thyromegaly Cardiovascular: RRR, no m/r/g. No LE edema.  Respiratory:  CTA bilaterally, no w/r/r. Normal respiratory effort. Abdomen:  soft, ntnd, NABS Skin:  no rash or induration seen on limited exam Musculoskeletal:  right calf draining pus Psychiatric:  grossly normal mood and affect, speech fluent and appropriate, AOx3 Neurologic:  CN 2-12 grossly intact, moves all extremities in coordinated fashion, sensation intact  Labs on Admission: I have personally reviewed following labs and imaging studies  CBC: Recent Labs  Lab 01/06/22 1326  WBC 11.4*  NEUTROABS 10.1*  HGB 11.1*  HCT 32.3*  MCV 93.1  PLT 564   Basic Metabolic Panel: Recent Labs  Lab 01/06/22 1326  NA 143  K 2.8*  CL 102  CO2 34*  GLUCOSE 144*  BUN 15  CREATININE 0.59*  CALCIUM 9.0   GFR: Estimated Creatinine Clearance: 71.6 mL/min (A) (by C-G formula based on SCr of  0.59 mg/dL (L)). Liver Function Tests: Recent Labs  Lab 01/06/22 1326  AST 14*  ALT 10  ALKPHOS 135*  BILITOT 0.6  PROT 6.8  ALBUMIN 2.5*   No results for input(s): LIPASE, AMYLASE in the last 168 hours. No results for input(s): AMMONIA in the last 168 hours. Coagulation Profile: No results for input(s): INR, PROTIME in the last 168 hours. Cardiac Enzymes: No results for input(s): CKTOTAL, CKMB, CKMBINDEX, TROPONINI in the last 168 hours. BNP (last 3 results) No results for input(s): PROBNP in the last 8760 hours. HbA1C: No results for input(s): HGBA1C in the last 72 hours. CBG: No results for input(s): GLUCAP in the last 168 hours. Lipid Profile: No results for input(s): CHOL, HDL, LDLCALC, TRIG, CHOLHDL, LDLDIRECT in the last 72 hours. Thyroid Function Tests: No results for input(s): TSH, T4TOTAL, FREET4, T3FREE, THYROIDAB in the last 72 hours. Anemia Panel: No results for input(s): VITAMINB12, FOLATE, FERRITIN, TIBC, IRON, RETICCTPCT in the last 72 hours. Urine analysis:    Component Value Date/Time   COLORURINE YELLOW 12/10/2020 1734   APPEARANCEUR CLEAR 12/10/2020 1734   LABSPEC >1.046 (H) 12/10/2020 1734   PHURINE 5.0 12/10/2020 1734   GLUCOSEU NEGATIVE 12/10/2020 1734   HGBUR NEGATIVE 12/10/2020 1734   HGBUR trace-intact 05/11/2007 0853   BILIRUBINUR NEGATIVE 12/10/2020 1734   BILIRUBINUR Negative 07/01/2018 1533   KETONESUR 5 (A) 12/10/2020 1734   PROTEINUR NEGATIVE 12/10/2020 1734   UROBILINOGEN 0.2 07/01/2018 1533   UROBILINOGEN 0.2 05/11/2007 0853   NITRITE NEGATIVE 12/10/2020 1734   LEUKOCYTESUR NEGATIVE 12/10/2020 1734    Creatinine Clearance: Estimated Creatinine Clearance: 71.6 mL/min (A) (by C-G formula based on SCr of 0.59 mg/dL (L)).  Sepsis Labs: '@LABRCNTIP'$ (procalcitonin:4,lacticidven:4) )No results found for this or any previous visit (from the past 240 hour(s)).   Radiological Exams on Admission: DG Tibia/Fibula Right  Result Date:  01/06/2022 CLINICAL DATA:  Calf abscesses drain today. EXAM: RIGHT TIBIA AND FIBULA - 2 VIEW COMPARISON:  None Available. FINDINGS: There is gas in the MEDIAL soft tissues likely representing the known abscess which was drained today. No fracture, subluxation or dislocation identified. No knee effusion is present. No focal bony lesions or evidence of acute osteomyelitis noted. IMPRESSION: MEDIAL soft tissue gas likely representing the known abscess which was drained today. No other significant abnormalities. Electronically Signed   By: Margarette Canada M.D.   On: 01/06/2022 14:26      Assessment/Plan Principal Problem:   Abscess   Right calf abscess failed outpatient treatment x2-status post I&D done by the hospice nurse at the facility and the ED physician IV vancomycin Wound culture Wound care consult  Severe hypokalemia replete check mag level  Parkinson's disease dementia-patient was enrolled in hospice and his medications were all stopped as he was declining with multiple falls hip fracture and wrist fractures. TOC consult Hospice follow-up  Goals of care he is DNR/DNI  Estimated body mass index is 20.97 kg/m as calculated from the following:   Height as of this encounter: '5\' 9"'$  (1.753 m).   Weight as of this encounter: 64.4 kg.   DVT prophylaxis: Lovenox Code Status: DNR/DNI Family Communication: Discussed with [name] his son POA Disposition Plan: Nursing home Consults called: Wound care consult  admission status: Observation   Georgette Shell MD  01/06/2022, 4:12 PM

## 2022-01-06 NOTE — ED Provider Notes (Signed)
[facility] DEPT Provider Note  CSN: [id number] Arrival date & time: 01/06/22 1209  Chief Complaint(s) Abscess  HPI [name]. is a 76 y.o. male with PMH Parkinson's disease currently in a facility with hospice care, HTN, HLD, chronic hypokalemia who presents emergency department for evaluation of a right lower extremity abscess.  Patient suffered a fall 6 weeks ago with an apparent possible hip fracture.  He has been bedbound since and approximately 1 week ago was started on oral antibiotics for a suspected right lower extremity cellulitis.  This progressed to an abscess that was opened and drained by hospice staff and patient completed a 5-day course of antibiotics.  Unfortunately, this abscess returned and the hospice nurses again drained the abscess but transferred the patient to the emergency department for sepsis rule out.  He denies fever, chest pain, shortness of breath, abdominal pain, nausea, vomiting or other systemic symptoms.   Past Medical History Past Medical History:  Diagnosis Date   Abdominal pain, right lower quadrant    Abnormality of gait 05/16/2016   Acute prostatitis    Anxiety    Constipation    Degenerative disc disease    Depression    Diverticulosis of colon (without mention of hemorrhage)    Essential and other specified forms of tremor    Family history of malignant neoplasm of gastrointestinal tract    Fibromyalgia    Ganglion and cyst of synovium, tendon, and bursa    GERD (gastroesophageal reflux disease)    Hemarthrosis, upper arm    Hyperlipidemia    Hypersomnia with sleep apnea, unspecified    Hypertension    Hypopotassemia    Localized osteoarthrosis not specified whether primary or secondary, lower leg    Lumbago    Memory difficulties 04/22/2013   Nocturia    Osteoporosis, unspecified    Other testicular hypofunction    Parkinson disease ([place]) 05/16/2016   Right inguinal hernia    Sleep apnea    last  sleep study 11/11 on chart- Bipap with settings of 4 per  patient   Spinal stenosis, lumbar region, without neurogenic claudication    Syncope and collapse    Unspecified adverse effect of unspecified drug, medicinal and biological substance    Unspecified gastritis and gastroduodenitis without mention of hemorrhage    Urinary frequency    Patient Active Problem List   Diagnosis Date Noted   Bradycardia 09/10/2019   Delusional disorder ([place]) 01/14/2019   Hallucinations 01/14/2019   Actinic keratoses 07/27/2017   Trochanteric bursitis, left hip 12/04/2016   Parkinson disease ([place]) 05/16/2016   Abnormality of gait 05/16/2016   Knee pain 11/01/2013   Traumatic bursitis 11/01/2013   Hx of fall 11/01/2013   Memory difficulties 04/22/2013   Hemorrhoids 02/24/2013   PRIMARY CENTRAL SLEEP APNEA 05/21/2010   NOCTURIA 05/21/2010   HYPERSOMNIA, ASSOCIATED WITH SLEEP APNEA 03/26/2010   ABDOMINAL PAIN RIGHT LOWER QUADRANT 03/20/2010   TREMOR, ESSENTIAL 11/22/2009   SPINAL STENOSIS, LUMBAR 06/07/2009   OSTEOARTHRITIS, LOWER LEG, LEFT 05/16/2009   MYOFASCIAL PAIN SYNDROME 05/16/2009   URINARY FREQUENCY, CHRONIC 02/09/2009   DISUSE OSTEOPOROSIS 09/07/2008   HYPOGONADISM, MALE 07/01/2008   HYPOKALEMIA 04/19/2008   SYNCOPE 04/19/2008   UNS ADVRS EFF UNS RX MEDICINAL&BIOLOGICAL SBSTNC 02/26/2008   CONSTIPATION, CHRONIC 11/23/2007   HYPERLIPIDEMIA 05/15/2007   Right-sided low back pain without sciatica 05/15/2007   Osteoporosis 05/15/2007   DIVERTICULOSIS, COLON 02/23/2007   Essential hypertension 02/12/2007   GERD 02/12/2007   GASTRITIS  02/12/2007   Home Medication(s) Prior to Admission medications   Medication Sig Start Date End Date Taking? Authorizing Provider  acetaminophen (TYLENOL) 325 MG tablet Take 650 mg by mouth every 6 (six) hours as needed for moderate pain.   Yes [provider]  oxyCODONE (OXY IR/ROXICODONE) 5 MG immediate release tablet Take 5 mg by mouth every 6  (six) hours as needed for pain. 12/24/21  Yes [provider]  PAIN RELIEF EXTRA STRENGTH 500 MG tablet Take 500 mg by mouth every 8 (eight) hours as needed for pain. 10/26/21  Yes [provider]  polyethylene glycol (MIRALAX / GLYCOLAX) 17 g packet Take 17 g by mouth daily. 05/16/20  Yes Kathrynn Ducking, MD  amLODipine (NORVASC) 10 MG tablet Take 1 tablet (10 mg total) by mouth daily. Patient not taking: Reported on 01/06/2022 08/10/19   Eulas Post, MD  Calcium Carbonate-Vitamin D (CALCIUM 600+D) 600-400 MG-UNIT tablet Take 1 tablet by mouth daily. Patient not taking: Reported on 01/06/2022 04/10/18   Burchette, Alinda Sierras, MD  CALMOSEPTINE 0.44-20.6 % OINT APPLY TOPICALLY TO BUTTOCKS AND PERINEUM AFTER EACH INCONTINENCE EPISODE AND AFTER SHOWERS. Patient not taking: Reported on 01/06/2022 05/02/21   Eulas Post, MD  carbidopa-levodopa (SINEMET IR) 25-100 MG tablet Take 1 tablet by mouth 3 (three) times daily. Patient not taking: Reported on 01/06/2022 09/20/20   Kathrynn Ducking, MD  citalopram (CELEXA) 20 MG tablet Take 1.5 tablets (30 mg total) by mouth daily. Patient not taking: Reported on 01/06/2022 10/24/21   Star Age, MD  entacapone (COMTAN) 200 MG tablet TAKE (1) TABLET BY MOUTH (3) TIMES DAILY. Patient not taking: Reported on 01/06/2022 11/13/18   Kathrynn Ducking, MD  famotidine (PEPCID) 20 MG tablet Take 1 tablet (20 mg total) by mouth 2 (two) times daily. Patient not taking: Reported on 01/06/2022 12/14/18   Eulas Post, MD  KLOR-CON M20 20 MEQ tablet TAKE 3 TABLETS (60 MEQ TOTAL) BY MOUTH DAILY. Patient not taking: Reported on 01/06/2022 01/02/18   Eulas Post, MD  losartan (COZAAR) 100 MG tablet TAKE 1 TABLET (100 MG TOTAL) BY MOUTH DAILY. Patient not taking: Reported on 01/06/2022 09/16/17   Eulas Post, MD  memantine (NAMENDA) 10 MG tablet TAKE 1 TABLET (10 MG TOTAL) BY MOUTH 2 (TWO) TIMES DAILY. Patient not taking: Reported on 01/06/2022  12/08/17   Kathrynn Ducking, MD  Pimavanserin Tartrate (NUPLAZID) 34 MG CAPS TAKE (1) CAPSULE BY MOUTH ONCE DAILY. Patient not taking: Reported on 01/06/2022 12/27/21   Eulas Post, MD  QUEtiapine (SEROQUEL) 25 MG tablet 1 tablet at 2 pm Patient not taking: Reported on 01/06/2022 11/07/20   Kathrynn Ducking, MD  trimethoprim-polymyxin b (POLYTRIM) ophthalmic solution Place 2 drops into both eyes every 4 (four) hours. Placed 2 drops into both eyes every 4 hours while awake for 7 days Patient not taking: Reported on 01/06/2022 08/15/20   Eulas Post, MD  Past Surgical History Past Surgical History:  Procedure Laterality Date   BACK SURGERY     x 3   cataract surgery Bilateral    HERNIA REPAIR  1965, 1968   INGUINAL HERNIA REPAIR  07/19/2011   Procedure: LAPAROSCOPIC INGUINAL HERNIA;  Surgeon: Odis Hollingshead, MD;  Location: [facility];  Service: General;  Laterality: Right;  Laparoscopic Repair of Recurrent Right  Ingunial Hernia with Mesh   microdisectomy  03/02/2003, 06/29/2003, 12/24/2005   TONSILLECTOMY     Family History Family History  Problem Relation Age of Onset   Colon cancer Mother    Dementia Mother    Parkinsonism Father 58   Lung cancer Paternal Grandfather        smoker   Liver disease Neg Hx    Kidney disease Neg Hx    Esophageal cancer Neg Hx     Social History Social History   Tobacco Use   Smoking status: Never   Smokeless tobacco: Never  Vaping Use   Vaping Use: Never used  Substance Use Topics   Alcohol use: No    Alcohol/week: 0.0 standard drinks    Comment: less than 5 per week   Drug use: No   Allergies Nucynta [tapentadol], Pregabalin, and Exelon [rivastigmine]  Review of Systems Review of Systems  Skin:  Positive for rash and wound.   Physical Exam Vital Signs  I have reviewed the triage vital signs BP  98/85 (BP Location: Right Arm)   Pulse 61   Temp 98.1 F (36.7 C) (Oral)   Resp (!) 22   Ht '5\' 9"'$  (1.753 m)   Wt 64.4 kg   SpO2 98%   BMI 20.97 kg/m   Physical Exam Constitutional:      General: He is not in acute distress.    Appearance: Normal appearance.  HENT:     Head: Normocephalic and atraumatic.     Nose: No congestion or rhinorrhea.  Eyes:     General:        Right eye: No discharge.        Left eye: No discharge.     Extraocular Movements: Extraocular movements intact.     Pupils: Pupils are equal, round, and reactive to light.  Cardiovascular:     Rate and Rhythm: Normal rate and regular rhythm.     Heart sounds: No murmur heard. Pulmonary:     Effort: No respiratory distress.     Breath sounds: No wheezing or rales.  Abdominal:     General: There is no distension.     Tenderness: There is no abdominal tenderness.  Musculoskeletal:        General: Tenderness present. Normal range of motion.     Cervical back: Normal range of motion.  Skin:    General: Skin is warm and dry.     Findings: Erythema and rash present.     Comments: Right calf abscess open and currently draining purulent material  Neurological:     General: No focal deficit present.     Mental Status: He is alert.    ED Results and Treatments Labs (all labs ordered are listed, but only abnormal results are displayed) Labs Reviewed  COMPREHENSIVE METABOLIC PANEL - Abnormal; Notable for the following components:      Result Value   Potassium 2.8 (*)    CO2 34 (*)    Glucose, Bld 144 (*)    Creatinine, Ser 0.59 (*)    Albumin 2.5 (*)  AST 14 (*)    Alkaline Phosphatase 135 (*)    All other components within normal limits  CBC WITH DIFFERENTIAL/PLATELET - Abnormal; Notable for the following components:   WBC 11.4 (*)    RBC 3.47 (*)    Hemoglobin 11.1 (*)    HCT 32.3 (*)    Neutro Abs 10.1 (*)    Lymphs Abs 0.6 (*)    All other components within normal limits  CULTURE, BLOOD  (ROUTINE X 2)  CULTURE, BLOOD (ROUTINE X 2)  LACTIC ACID, PLASMA  LACTIC ACID, PLASMA  SEDIMENTATION RATE  C-REACTIVE PROTEIN                                                                                                                          Radiology DG Tibia/Fibula Right  Result Date: 01/06/2022 CLINICAL DATA:  Calf abscesses drain today. EXAM: RIGHT TIBIA AND FIBULA - 2 VIEW COMPARISON:  None Available. FINDINGS: There is gas in the MEDIAL soft tissues likely representing the known abscess which was drained today. No fracture, subluxation or dislocation identified. No knee effusion is present. No focal bony lesions or evidence of acute osteomyelitis noted. IMPRESSION: MEDIAL soft tissue gas likely representing the known abscess which was drained today. No other significant abnormalities. Electronically Signed   By: Margarette Canada M.D.   On: 01/06/2022 14:26    Pertinent labs & imaging results that were available during my care of the patient were reviewed by me and considered in my medical decision making (see MDM for details).  Medications Ordered in ED Medications  lactated ringers bolus 1,000 mL (has no administration in time range)  vancomycin (VANCOCIN) IVPB 1000 mg/200 mL premix (has no administration in time range)  potassium chloride SA (KLOR-CON M) CR tablet 40 mEq (40 mEq Oral Given 01/06/22 1426)  magnesium oxide (MAG-OX) tablet 800 mg (800 mg Oral Given 01/06/22 1425)                                                                                                                                     Procedures .Marland KitchenIncision and Drainage  Date/Time: 01/06/2022 3:57 PM Performed by: Teressa Lower, MD Authorized by: Teressa Lower, MD   Location:    Type:  Abscess   Size:  4 cm   Location:  Lower extremity   Lower extremity location:  Leg   Leg location:  R  lower leg Pre-procedure details:    Skin preparation:  Povidone-iodine Sedation:    Sedation type:   None Anesthesia:    Anesthesia method:  None Procedure type:    Complexity:  Simple Procedure details:    Incision types:  Single straight   Wound management:  Probed and deloculated   Drainage:  Purulent   Drainage amount:  Scant   Wound treatment:  Wound left open   Packing materials:  1/4 in iodoform gauze Post-procedure details:    Procedure completion:  Tolerated well, no immediate complications  (including critical care time)  Medical Decision Making / ED Course   This patient presents to the ED for concern of right lower extremity abscess, this involves an extensive number of treatment options, and is a complaint that carries with it a high risk of complications and morbidity.  The differential diagnosis includes abscess, cellulitis, necrotizing skin infection, pressure ulcer  MDM: Patient seen in the emergency room for evaluation of right lower extremity abscess.  Physical exam with 2 wounds to the right lower extremity, 1 on the lateral malleolus and 1 to the posterior calf.  The wound of the posterior calf is approximately 4 cm in length and is actively expressing purulent material secondary to incision and drainage performed at outside facility.  I opened this wound up more and packed it with iodoform gauze.  Laboratory evaluation with leukocytosis to 11.4, hemoglobin 11.1, hypokalemia 2.8, lactate normal at 1.9.  I have low suspicion for underlying bacteremia, and a x-ray of the right lower extremity shows the abscess pocket but does not show any additional gas.  If the patient's CRP is less than 15, his the LRINEC score would be fairly low and at this point I have low suspicion for necrotizing soft tissue infection.  Patient will be started on vancomycin for failure of outpatient antibiotics and require admission.  Electrolytes repleted.   Additional history obtained: -Additional history obtained from son in law -External records from outside source obtained and reviewed  including: Chart review including previous notes, labs, imaging, consultation notes   Lab Tests: -I ordered, reviewed, and interpreted labs.   The pertinent results include:   Labs Reviewed  COMPREHENSIVE METABOLIC PANEL - Abnormal; Notable for the following components:      Result Value   Potassium 2.8 (*)    CO2 34 (*)    Glucose, Bld 144 (*)    Creatinine, Ser 0.59 (*)    Albumin 2.5 (*)    AST 14 (*)    Alkaline Phosphatase 135 (*)    All other components within normal limits  CBC WITH DIFFERENTIAL/PLATELET - Abnormal; Notable for the following components:   WBC 11.4 (*)    RBC 3.47 (*)    Hemoglobin 11.1 (*)    HCT 32.3 (*)    Neutro Abs 10.1 (*)    Lymphs Abs 0.6 (*)    All other components within normal limits  CULTURE, BLOOD (ROUTINE X 2)  CULTURE, BLOOD (ROUTINE X 2)  LACTIC ACID, PLASMA  LACTIC ACID, PLASMA  SEDIMENTATION RATE  C-REACTIVE PROTEIN     Imaging Studies ordered: I ordered imaging studies including XR tib fib  I independently visualized and interpreted imaging. I agree with the radiologist interpretation   Medicines ordered and prescription drug management: Meds ordered this encounter  Medications   potassium chloride SA (KLOR-CON M) CR tablet 40 mEq   magnesium oxide (MAG-OX) tablet 800 mg   lactated ringers bolus 1,000 mL  vancomycin (VANCOCIN) IVPB 1000 mg/200 mL premix    Order Specific Question:   Indication:    Answer:   Cellulitis    -I have reviewed the patients home medicines and have made adjustments as needed  Critical interventions none   Cardiac Monitoring: The patient was maintained on a cardiac monitor.  I personally viewed and interpreted the cardiac monitored which showed an underlying rhythm of: NSR  Social Determinants of Health:  Factors impacting patients care include: none   Reevaluation: After the interventions noted above, I reevaluated the patient and found that they have :improved  Co morbidities that  complicate the patient evaluation  Past Medical History:  Diagnosis Date   Abdominal pain, right lower quadrant    Abnormality of gait 05/16/2016   Acute prostatitis    Anxiety    Constipation    Degenerative disc disease    Depression    Diverticulosis of colon (without mention of hemorrhage)    Essential and other specified forms of tremor    Family history of malignant neoplasm of gastrointestinal tract    Fibromyalgia    Ganglion and cyst of synovium, tendon, and bursa    GERD (gastroesophageal reflux disease)    Hemarthrosis, upper arm    Hyperlipidemia    Hypersomnia with sleep apnea, unspecified    Hypertension    Hypopotassemia    Localized osteoarthrosis not specified whether primary or secondary, lower leg    Lumbago    Memory difficulties 04/22/2013   Nocturia    Osteoporosis, unspecified    Other testicular hypofunction    Parkinson disease ([place]) 05/16/2016   Right inguinal hernia    Sleep apnea    last sleep study 11/11 on chart- Bipap with settings of 4 per  patient   Spinal stenosis, lumbar region, without neurogenic claudication    Syncope and collapse    Unspecified adverse effect of unspecified drug, medicinal and biological substance    Unspecified gastritis and gastroduodenitis without mention of hemorrhage    Urinary frequency       Dispostion: I considered admission for this patient, and due to failure of outpatient biotics patient will require admission     Final Clinical Impression(s) / ED Diagnoses Final diagnoses:  None     '@PCDICTATION'$ @    Teressa Lower, MD 01/06/22 1601

## 2022-01-06 NOTE — Progress Notes (Signed)
A consult was received from an ED physician for vancomycin per pharmacy dosing (for an indication other than meningitis). The patient's profile has been reviewed for ht/wt/allergies/indication/available labs. A one time order has been placed for the above antibiotics.  Further antibiotics/pharmacy consults should be ordered by admitting physician if indicated.                       Reuel Boom, PharmD, BCPS [phone] 01/06/2022, 3:58 PM

## 2022-01-06 NOTE — Progress Notes (Addendum)
Received order from Dr. Rodena Piety to repeat lactic 2hrs after the last.

## 2022-01-07 ENCOUNTER — Inpatient Hospital Stay (HOSPITAL_COMMUNITY): Payer: PPO

## 2022-01-07 DIAGNOSIS — E876 Hypokalemia: Secondary | ICD-10-CM | POA: Diagnosis present

## 2022-01-07 DIAGNOSIS — Z0389 Encounter for observation for other suspected diseases and conditions ruled out: Secondary | ICD-10-CM | POA: Diagnosis not present

## 2022-01-07 DIAGNOSIS — I10 Essential (primary) hypertension: Secondary | ICD-10-CM | POA: Diagnosis present

## 2022-01-07 DIAGNOSIS — L89513 Pressure ulcer of right ankle, stage 3: Secondary | ICD-10-CM | POA: Diagnosis present

## 2022-01-07 DIAGNOSIS — M48061 Spinal stenosis, lumbar region without neurogenic claudication: Secondary | ICD-10-CM | POA: Diagnosis present

## 2022-01-07 DIAGNOSIS — L89519 Pressure ulcer of right ankle, unspecified stage: Secondary | ICD-10-CM | POA: Diagnosis present

## 2022-01-07 DIAGNOSIS — L0291 Cutaneous abscess, unspecified: Secondary | ICD-10-CM | POA: Diagnosis not present

## 2022-01-07 DIAGNOSIS — F0280 Dementia in other diseases classified elsewhere without behavioral disturbance: Secondary | ICD-10-CM | POA: Diagnosis present

## 2022-01-07 DIAGNOSIS — M7989 Other specified soft tissue disorders: Secondary | ICD-10-CM | POA: Diagnosis not present

## 2022-01-07 DIAGNOSIS — R600 Localized edema: Secondary | ICD-10-CM | POA: Diagnosis not present

## 2022-01-07 DIAGNOSIS — Z801 Family history of malignant neoplasm of trachea, bronchus and lung: Secondary | ICD-10-CM | POA: Diagnosis not present

## 2022-01-07 DIAGNOSIS — E440 Moderate protein-calorie malnutrition: Secondary | ICD-10-CM | POA: Diagnosis present

## 2022-01-07 DIAGNOSIS — L89613 Pressure ulcer of right heel, stage 3: Secondary | ICD-10-CM | POA: Diagnosis not present

## 2022-01-07 DIAGNOSIS — Z800 Family history of malignant neoplasm of digestive organs: Secondary | ICD-10-CM | POA: Diagnosis not present

## 2022-01-07 DIAGNOSIS — Z79899 Other long term (current) drug therapy: Secondary | ICD-10-CM | POA: Diagnosis not present

## 2022-01-07 DIAGNOSIS — L89152 Pressure ulcer of sacral region, stage 2: Secondary | ICD-10-CM | POA: Diagnosis present

## 2022-01-07 DIAGNOSIS — L02415 Cutaneous abscess of right lower limb: Secondary | ICD-10-CM | POA: Diagnosis present

## 2022-01-07 DIAGNOSIS — F03B0 Unspecified dementia, moderate, without behavioral disturbance, psychotic disturbance, mood disturbance, and anxiety: Secondary | ICD-10-CM | POA: Diagnosis not present

## 2022-01-07 DIAGNOSIS — M810 Age-related osteoporosis without current pathological fracture: Secondary | ICD-10-CM | POA: Diagnosis present

## 2022-01-07 DIAGNOSIS — F32A Depression, unspecified: Secondary | ICD-10-CM | POA: Diagnosis present

## 2022-01-07 DIAGNOSIS — Z7401 Bed confinement status: Secondary | ICD-10-CM | POA: Diagnosis not present

## 2022-01-07 DIAGNOSIS — Z8781 Personal history of (healed) traumatic fracture: Secondary | ICD-10-CM | POA: Diagnosis not present

## 2022-01-07 DIAGNOSIS — E872 Acidosis, unspecified: Secondary | ICD-10-CM | POA: Diagnosis present

## 2022-01-07 DIAGNOSIS — M25461 Effusion, right knee: Secondary | ICD-10-CM | POA: Diagnosis not present

## 2022-01-07 DIAGNOSIS — R54 Age-related physical debility: Secondary | ICD-10-CM | POA: Diagnosis present

## 2022-01-07 DIAGNOSIS — K219 Gastro-esophageal reflux disease without esophagitis: Secondary | ICD-10-CM | POA: Diagnosis present

## 2022-01-07 DIAGNOSIS — Z66 Do not resuscitate: Secondary | ICD-10-CM | POA: Diagnosis present

## 2022-01-07 DIAGNOSIS — Z820 Family history of epilepsy and other diseases of the nervous system: Secondary | ICD-10-CM | POA: Diagnosis not present

## 2022-01-07 DIAGNOSIS — Z6820 Body mass index (BMI) 20.0-20.9, adult: Secondary | ICD-10-CM | POA: Diagnosis not present

## 2022-01-07 DIAGNOSIS — R4182 Altered mental status, unspecified: Secondary | ICD-10-CM | POA: Diagnosis not present

## 2022-01-07 DIAGNOSIS — E785 Hyperlipidemia, unspecified: Secondary | ICD-10-CM | POA: Diagnosis present

## 2022-01-07 DIAGNOSIS — L89619 Pressure ulcer of right heel, unspecified stage: Secondary | ICD-10-CM | POA: Diagnosis present

## 2022-01-07 DIAGNOSIS — G20 Parkinson's disease: Secondary | ICD-10-CM | POA: Diagnosis present

## 2022-01-07 LAB — COMPREHENSIVE METABOLIC PANEL
ALT: 11 U/L (ref 0–44)
AST: 10 U/L — ABNORMAL LOW (ref 15–41)
Albumin: 2.4 g/dL — ABNORMAL LOW (ref 3.5–5.0)
Alkaline Phosphatase: 109 U/L (ref 38–126)
Anion gap: 7 (ref 5–15)
BUN: 15 mg/dL (ref 8–23)
CO2: 30 mmol/L (ref 22–32)
Calcium: 8.5 mg/dL — ABNORMAL LOW (ref 8.9–10.3)
Chloride: 106 mmol/L (ref 98–111)
Creatinine, Ser: 0.38 mg/dL — ABNORMAL LOW (ref 0.61–1.24)
GFR, Estimated: >60 mL/min (ref >60)
Glucose, Bld: 96 mg/dL (ref 70–99)
Potassium: 3.8 mmol/L (ref 3.5–5.1)
Sodium: 143 mmol/L (ref 135–145)
Total Bilirubin: 0.6 mg/dL (ref 0.3–1.2)
Total Protein: 5.5 g/dL — ABNORMAL LOW (ref 6.5–8.1)

## 2022-01-07 LAB — CBC
HCT: 26.7 % — ABNORMAL LOW (ref 39.0–52.0)
Hemoglobin: 8.9 g/dL — ABNORMAL LOW (ref 13.0–17.0)
MCH: 32 pg (ref 26.0–34.0)
MCHC: 33.3 g/dL (ref 30.0–36.0)
MCV: 96 fL (ref 80.0–100.0)
Platelets: 238 10*3/uL (ref 150–400)
RBC: 2.78 MIL/uL — ABNORMAL LOW (ref 4.22–5.81)
RDW: 14.6 % (ref 11.5–15.5)
WBC: 8.1 10*3/uL (ref 4.0–10.5)
nRBC: 0 % (ref 0.0–0.2)

## 2022-01-07 LAB — BLOOD CULTURE ID PANEL (REFLEXED) - BCID2

## 2022-01-07 MED ORDER — GADOBUTROL 1 MMOL/ML IV SOLN
6.0000 mL | Freq: Once | INTRAVENOUS | Status: AC | PRN
  Administered 2022-01-07: 6 mL via INTRAVENOUS

## 2022-01-07 NOTE — TOC Initial Note (Signed)
Transition of Care (TOC) - Initial/Assessment Note    Patient Details  Name: [name]. MRN: [id number] Date of Birth: 06-Jul-1946  Transition of Care [facility]) CM/SW Contact:    Lennart Pall, LCSW Phone Number: 01/07/2022, 3:18 PM  Clinical Narrative:                 Met with pt today in attempt to assess, however, pt oriented to self only.  Able to speak with pt's son, [name], via phone to introduce self/ TOC role.  Son confirms pt has been a resident in [place] at [facility] x 4 yrs.  Notes that pt was relatively oriented and able to manage mobility with rollator and his own ADLs until recent decline.  With decline, decision was made to refer pt to [facility] and they were following pt at ALF until this admission.  Son hopeful that wound/ infection can be treated with plan for pt to return to ALF when medically cleared.  TOC will continue to follow.  Expected Discharge Plan: Assisted Living Barriers to Discharge: Continued Medical Work up   Patient Goals and CMS Choice Patient states their goals for this hospitalization and ongoing recovery are:: son hopeful to treat infection and pt to return to ALF      Expected Discharge Plan and Services Expected Discharge Plan: Assisted Living In-house Referral: Clinical Social Work     Living arrangements for the past 2 months: [place] ([facility])                                      Prior Living Arrangements/Services Living arrangements for the past 2 months: [place] ([facility]) Lives with:: Facility Resident Patient language and need for interpreter reviewed:: Yes Do you feel safe going back to the place where you live?: Yes      Need for Family Participation in Patient Care: No (Comment) Care giver support system in place?: Yes (comment) Current home services: [facility]) Criminal Activity/Legal Involvement Pertinent to Current Situation/Hospitalization: No -  Comment as needed  Activities of Daily Living Home Assistive Devices/Equipment: Eyeglasses, Wheelchair ADL Screening (condition at time of admission) Patient's cognitive ability adequate to safely complete daily activities?: No Is the patient deaf or have difficulty hearing?: Yes Does the patient have difficulty seeing, even when wearing glasses/contacts?: No (eyeglasses) Does the patient have difficulty concentrating, remembering, or making decisions?: Yes Patient able to express need for assistance with ADLs?: No Does the patient have difficulty dressing or bathing?: Yes Independently performs ADLs?: No Communication: Dependent Is this a change from baseline?: Pre-admission baseline Dressing (OT): Dependent Is this a change from baseline?: Pre-admission baseline Grooming: Dependent Is this a change from baseline?: Pre-admission baseline Feeding: Dependent Is this a change from baseline?: Pre-admission baseline Bathing: Dependent Is this a change from baseline?: Pre-admission baseline Toileting: Dependent Is this a change from baseline?: Pre-admission baseline In/Out Bed: Dependent Is this a change from baseline?: Pre-admission baseline Walks in Home: Dependent (uses wheelchair at [facility]) Is this a change from baseline?: Pre-admission baseline Does the patient have difficulty walking or climbing stairs?: Yes Weakness of Legs: Both Weakness of Arms/Hands: None  Permission Sought/Granted Permission sought to share information with : Family Supports, Chartered certified accountant granted to share information with : Yes, Verbal Permission Granted  Share Information with NAME: Cuinn Westerhold     Permission granted  to share info w Relationship: son  Permission granted to share info w Contact Information: [phone]  Emotional Assessment Appearance:: Appears stated age Attitude/Demeanor/Rapport: Gracious Affect (typically observed): Accepting Orientation: :  Oriented to Self Alcohol / Substance Use: Not Applicable Psych Involvement: No (comment)  Admission diagnosis:  Abscess [L02.91] Patient Active Problem List   Diagnosis Date Noted   Abscess 01/06/2022   Bradycardia 09/10/2019   Delusional disorder ([place]) 01/14/2019   Hallucinations 01/14/2019   Actinic keratoses 07/27/2017   Trochanteric bursitis, left hip 12/04/2016   Parkinson disease ([place]) 05/16/2016   Abnormality of gait 05/16/2016   Knee pain 11/01/2013   Traumatic bursitis 11/01/2013   Hx of fall 11/01/2013   Memory difficulties 04/22/2013   Hemorrhoids 02/24/2013   PRIMARY CENTRAL SLEEP APNEA 05/21/2010   NOCTURIA 05/21/2010   HYPERSOMNIA, ASSOCIATED WITH SLEEP APNEA 03/26/2010   ABDOMINAL PAIN RIGHT LOWER QUADRANT 03/20/2010   TREMOR, ESSENTIAL 11/22/2009   SPINAL STENOSIS, LUMBAR 06/07/2009   OSTEOARTHRITIS, LOWER LEG, LEFT 05/16/2009   MYOFASCIAL PAIN SYNDROME 05/16/2009   URINARY FREQUENCY, CHRONIC 02/09/2009   DISUSE OSTEOPOROSIS 09/07/2008   HYPOGONADISM, MALE 07/01/2008   HYPOKALEMIA 04/19/2008   SYNCOPE 04/19/2008   UNS ADVRS EFF UNS RX MEDICINAL&BIOLOGICAL SBSTNC 02/26/2008   CONSTIPATION, CHRONIC 11/23/2007   HYPERLIPIDEMIA 05/15/2007   Right-sided low back pain without sciatica 05/15/2007   Osteoporosis 05/15/2007   DIVERTICULOSIS, COLON 02/23/2007   Essential hypertension 02/12/2007   GERD 02/12/2007   GASTRITIS 02/12/2007   PCP:  Eulas Post, MD Pharmacy:   [facility] [place] [address] [address] Phone: [phone] Fax: [phone]     Social Determinants of Health (SDOH) Interventions    Readmission Risk Interventions     View : No data to display.

## 2022-01-07 NOTE — Consult Note (Signed)
ORTHOPAEDIC CONSULTATION  REQUESTING PHYSICIAN: Georgette Shell, MD  Chief Complaint: Right calf drainage, wound  HPI: [name]. is a 76 y.o. male with parkinson's, dementia admitted to hospital for RLE abscess and drainage.  Has had this wound and drainage for 6-8 months.  This was reportedly opened up and drained by provider at his nursing home.  Ortho consulted for recommendations.  Past Medical History:  Diagnosis Date   Abdominal pain, right lower quadrant    Abnormality of gait 05/16/2016   Acute prostatitis    Anxiety    Constipation    Degenerative disc disease    Depression    Diverticulosis of colon (without mention of hemorrhage)    Essential and other specified forms of tremor    Family history of malignant neoplasm of gastrointestinal tract    Fibromyalgia    Ganglion and cyst of synovium, tendon, and bursa    GERD (gastroesophageal reflux disease)    Hemarthrosis, upper arm    Hyperlipidemia    Hypersomnia with sleep apnea, unspecified    Hypertension    Hypopotassemia    Localized osteoarthrosis not specified whether primary or secondary, lower leg    Lumbago    Memory difficulties 04/22/2013   Nocturia    Osteoporosis, unspecified    Other testicular hypofunction    Parkinson disease ([place]) 05/16/2016   Right inguinal hernia    Sleep apnea    last sleep study 11/11 on chart- Bipap with settings of 4 per  patient   Spinal stenosis, lumbar region, without neurogenic claudication    Syncope and collapse    Unspecified adverse effect of unspecified drug, medicinal and biological substance    Unspecified gastritis and gastroduodenitis without mention of hemorrhage    Urinary frequency    Past Surgical History:  Procedure Laterality Date   BACK SURGERY     x 3   cataract surgery Bilateral    [place]  07/19/2011   Procedure: LAPAROSCOPIC INGUINAL HERNIA;  Surgeon: Odis Hollingshead, MD;  Location: [facility]  [facility];  Service: General;  Laterality: Right;  Laparoscopic Repair of Recurrent Right  Ingunial Hernia with Mesh   microdisectomy  03/02/2003, 06/29/2003, 12/24/2005   TONSILLECTOMY     Social History   Socioeconomic History   Marital status: Widowed    Spouse name: Not on file   Number of children: 2   Years of education: college   Highest education level: Not on file  Occupational History   Occupation: retired    Fish farm manager: RETIRED  Tobacco Use   Smoking status: Never   Smokeless tobacco: Never  Vaping Use   Vaping Use: Never used  Substance and Sexual Activity   Alcohol use: No    Alcohol/week: 0.0 standard drinks    Comment: less than 5 per week   Drug use: No   Sexual activity: Not on file  Other Topics Concern   Not on file  Social History Narrative   Lives at [place], [place] alone in an assisted living apartment    Patient is right handed.   Patient drinks 1 cup of caffeine per day.   Social Determinants of Health   Financial Resource Strain: Low Risk    Difficulty of Paying Living Expenses: Not hard at all  Food Insecurity: No Food Insecurity   Worried About Charity fundraiser in the Last Year: Never true   [place] in the  Last Year: Never true  Transportation Needs: No Transportation Needs   Lack of Transportation (Medical): No   Lack of Transportation (Non-Medical): No  Physical Activity: Not on file  Stress: No Stress Concern Present   Feeling of Stress : Not at all  Social Connections: Moderately Isolated   Frequency of Communication with Friends and Family: Twice a week   Frequency of Social Gatherings with Friends and Family: Twice a week   Attends Religious Services: 1 to 4 times per year   Active Member of [employer] or Organizations: No   Attends Archivist Meetings: Never   Marital Status: Widowed   Family History  Problem Relation Age of Onset   Colon cancer Mother    Dementia Mother    Parkinsonism Father 67   Lung cancer  Paternal Grandfather        smoker   Liver disease Neg Hx    Kidney disease Neg Hx    Esophageal cancer Neg Hx    - negative except otherwise stated in the family history section Allergies  Allergen Reactions   Nucynta [Tapentadol] Other (See Comments)    dizziness   Pregabalin     REACTION: dizziness and mental status changes   Exelon [Rivastigmine]     abd pain   Prior to Admission medications   Medication Sig Start Date End Date Taking? Authorizing Provider  acetaminophen (TYLENOL) 325 MG tablet Take 650 mg by mouth every 6 (six) hours as needed for moderate pain.   Yes [provider]  oxyCODONE (OXY IR/ROXICODONE) 5 MG immediate release tablet Take 5 mg by mouth every 6 (six) hours as needed for pain. 12/24/21  Yes [provider]  PAIN RELIEF EXTRA STRENGTH 500 MG tablet Take 500 mg by mouth every 8 (eight) hours as needed for pain. 10/26/21  Yes [provider]  polyethylene glycol (MIRALAX / GLYCOLAX) 17 g packet Take 17 g by mouth daily. 05/16/20  Yes Kathrynn Ducking, MD  amLODipine (NORVASC) 10 MG tablet Take 1 tablet (10 mg total) by mouth daily. Patient not taking: Reported on 01/06/2022 08/10/19   Eulas Post, MD  Calcium Carbonate-Vitamin D (CALCIUM 600+D) 600-400 MG-UNIT tablet Take 1 tablet by mouth daily. Patient not taking: Reported on 01/06/2022 04/10/18   Burchette, Alinda Sierras, MD  CALMOSEPTINE 0.44-20.6 % OINT APPLY TOPICALLY TO BUTTOCKS AND PERINEUM AFTER EACH INCONTINENCE EPISODE AND AFTER SHOWERS. Patient not taking: Reported on 01/06/2022 05/02/21   Eulas Post, MD  carbidopa-levodopa (SINEMET IR) 25-100 MG tablet Take 1 tablet by mouth 3 (three) times daily. Patient not taking: Reported on 01/06/2022 09/20/20   Kathrynn Ducking, MD  citalopram (CELEXA) 20 MG tablet Take 1.5 tablets (30 mg total) by mouth daily. Patient not taking: Reported on 01/06/2022 10/24/21   Star Age, MD  entacapone (COMTAN) 200 MG tablet TAKE (1) TABLET BY  MOUTH (3) TIMES DAILY. Patient not taking: Reported on 01/06/2022 11/13/18   Kathrynn Ducking, MD  famotidine (PEPCID) 20 MG tablet Take 1 tablet (20 mg total) by mouth 2 (two) times daily. Patient not taking: Reported on 01/06/2022 12/14/18   Eulas Post, MD  KLOR-CON M20 20 MEQ tablet TAKE 3 TABLETS (60 MEQ TOTAL) BY MOUTH DAILY. Patient not taking: Reported on 01/06/2022 01/02/18   Eulas Post, MD  losartan (COZAAR) 100 MG tablet TAKE 1 TABLET (100 MG TOTAL) BY MOUTH DAILY. Patient not taking: Reported on 01/06/2022 09/16/17   Eulas Post, MD  memantine (  NAMENDA) 10 MG tablet TAKE 1 TABLET (10 MG TOTAL) BY MOUTH 2 (TWO) TIMES DAILY. Patient not taking: Reported on 01/06/2022 12/08/17   Kathrynn Ducking, MD  Pimavanserin Tartrate (NUPLAZID) 34 MG CAPS TAKE (1) CAPSULE BY MOUTH ONCE DAILY. Patient not taking: Reported on 01/06/2022 12/27/21   Eulas Post, MD  QUEtiapine (SEROQUEL) 25 MG tablet 1 tablet at 2 pm Patient not taking: Reported on 01/06/2022 11/07/20   Kathrynn Ducking, MD  trimethoprim-polymyxin b (POLYTRIM) ophthalmic solution Place 2 drops into both eyes every 4 (four) hours. Placed 2 drops into both eyes every 4 hours while awake for 7 days Patient not taking: Reported on 01/06/2022 08/15/20   Eulas Post, MD   DG Tibia/Fibula Right  Result Date: 01/06/2022 CLINICAL DATA:  Calf abscesses drain today. EXAM: RIGHT TIBIA AND FIBULA - 2 VIEW COMPARISON:  None Available. FINDINGS: There is gas in the MEDIAL soft tissues likely representing the known abscess which was drained today. No fracture, subluxation or dislocation identified. No knee effusion is present. No focal bony lesions or evidence of acute osteomyelitis noted. IMPRESSION: MEDIAL soft tissue gas likely representing the known abscess which was drained today. No other significant abnormalities. Electronically Signed   By: Margarette Canada M.D.   On: 01/06/2022 14:26   - pertinent xrays, CT, MRI studies were  reviewed and independently interpreted  Positive ROS: All other systems have been reviewed and were otherwise negative with the exception of those mentioned in the HPI and as above.  Physical Exam: General: No acute distress, pleasantly demented Cardiovascular: No pedal edema Respiratory: No cyanosis, no use of accessory musculature GI: No organomegaly, abdomen is soft and non-tender Skin: No lesions in the area of chief complaint Neurologic: Sensation intact distally Psychiatric: Patient is at baseline mood and affect Lymphatic: No axillary or cervical lymphadenopathy  MUSCULOSKELETAL:  RLE - 5 mm open wound on posterior calf with purulent drainage, small surrounding area of dark erythema  - soft compartments - induration of posterior calf - moderate equinus contracture with heel ulcers - no knee joint effusion, painless knee ROM  Assessment: Right calf abscess with open wound and drainage  Plan: Patient has had this chronic wound and drainage for at least 6 months.  The open wound is allowing the abscess to drain adequately.  Patient is not acutely septic therefore I see little benefit to a formal I&D at this time unless he becomes acutely ill.  I have a low suspicion that his knee joint is involved but agree that MRI of tib-fib and knee are helpful to determine the extent of the infection although would not likely change treatment at this time.  I called his son to discuss but was unable to reach him.    Thank you for the consult and the opportunity to see Mr. Hesch  N. Eduard Roux, MD [facility] 2:47 PM

## 2022-01-07 NOTE — Consult Note (Signed)
PODIATRY CONSULTATION  NAME [name]. MRN [id number] DOB 18-Apr-1946 DOA 01/06/2022   Reason for consult: Abscess/wounds right lower extremity Chief Complaint  Patient presents with   Abscess    Consulting physician: Landis Gandy MD  History of present illness: 76 y.o. male PMHx Parkinson's, dementia admitted to the hospital for worsening cellulitis and abscess right lower extremity.  Patient is a poor historian however he states that he has had the wound to the right lower extremity for about 6-8 months now.  Patient enrolled in hospice care.  Podiatry consulted for worsening wounds and abscess to the posterior calf.  X-rays taken tib-fib right.  Patient resting comfortably in bed  Past Medical History:  Diagnosis Date   Abdominal pain, right lower quadrant    Abnormality of gait 05/16/2016   Acute prostatitis    Anxiety    Constipation    Degenerative disc disease    Depression    Diverticulosis of colon (without mention of hemorrhage)    Essential and other specified forms of tremor    Family history of malignant neoplasm of gastrointestinal tract    Fibromyalgia    Ganglion and cyst of synovium, tendon, and bursa    GERD (gastroesophageal reflux disease)    Hemarthrosis, upper arm    Hyperlipidemia    Hypersomnia with sleep apnea, unspecified    Hypertension    Hypopotassemia    Localized osteoarthrosis not specified whether primary or secondary, lower leg    Lumbago    Memory difficulties 04/22/2013   Nocturia    Osteoporosis, unspecified    Other testicular hypofunction    Parkinson disease ([place]) 05/16/2016   Right inguinal hernia    Sleep apnea    last sleep study 11/11 on chart- Bipap with settings of 4 per  patient   Spinal stenosis, lumbar region, without neurogenic claudication    Syncope and collapse    Unspecified adverse effect of unspecified drug, medicinal and biological substance    Unspecified gastritis and gastroduodenitis without mention  of hemorrhage    Urinary frequency        Latest Ref Rng & Units 01/07/2022    4:23 AM 01/06/2022    1:26 PM 12/10/2020    3:38 PM  CBC  WBC 4.0 - 10.5 K/uL 8.1   11.4   7.8    Hemoglobin 13.0 - 17.0 g/dL 8.9   11.1   13.1    Hematocrit 39.0 - 52.0 % 26.7   32.3   38.5    Platelets 150 - 400 K/uL 238   362   158         Latest Ref Rng & Units 01/07/2022    4:23 AM 01/06/2022    1:26 PM 12/10/2020    3:38 PM  BMP  Glucose 70 - 99 mg/dL 96   144   127    BUN 8 - 23 mg/dL '15   15   25    '$ Creatinine 0.61 - 1.24 mg/dL 0.38   0.59   1.18    Sodium 135 - 145 mmol/L 143   143   139    Potassium 3.5 - 5.1 mmol/L 3.8   2.8   3.3    Chloride 98 - 111 mmol/L 106   102   101    CO2 22 - 32 mmol/L 30   34   28    Calcium 8.9 - 10.3 mg/dL 8.5   9.0   8.9  Physical Exam: General: The patient is alert and oriented x3 in no acute distress.   Dermatology: Heavily draining abscess right posterior calf just distal to the posterior aspect of the knee.  Heavy sanguinous purulent drainage with expression.  No malodor.  Please see above noted photo  There is also an ulcer to the posterior tubercle of the calcaneus right lower extremity.  Fibrotic wound base.  Again, no malodor.  Minimal serous drainage.  Periwound intact.  There is no exposed bone muscle tendon ligament or joint  Final ulcer noted to the fibular malleolus right ankle.  Well adhered eschar.  No malodor.  Mild serous drainage.  Vascular: Skin is cool to touch.  Negative for any heavy edema.  Neurological: Patient is sensate.    Musculoskeletal Exam: Nonambulatory.  Pain with light touch and palpation of the posterior calf    ASSESSMENT/PLAN OF CARE Abscess right posterior calf -Abscess to the right posterior calf extends proximal possibly into the knee and associated bursa.  Recommend MRI. -The draining abscess and wound to the posterior calf is out of scope of practice for podiatry.  Especially since it  possibly extends into the knee joint and surrounding tissues.  Will defer management and treatment of this patient to orthopedics.  Hospitalist notified. -Podiatry to sign off    Thank you for the consult.    Edrick Kins, DPM [facility]  Dr. Edrick Kins, DPM    [address], [place] [address]                Office [phone]  Fax [phone]

## 2022-01-07 NOTE — Progress Notes (Signed)
PROGRESS NOTE    [name].  ONG:[id number] DOB: November 03, 1945 DOA: 01/06/2022 PCP: Eulas Post, MD   Brief Narrative:  [name]. is a 76 y.o. male with medical history significant of Parkinson's disease, dementia he was sent in by the staff concerning abscess on his right posterior aspect of his right leg and heal  This was drained by hospice nurse at the facility and the ED physician in the ER checked on it and was still draining pus.  Patient had a fall or had multiple falls prior to admission and had a femoral head fracture about 6 weeks ago and has been bedbound since.  No fever chills nausea vomiting diarrhea no cough or shortness of breath he has been gradually declining since his hip fracture is a hospice consult was placed last month and he is followed by [facility]. Patient recently had right lower extremity cellulitis which was treated with oral antibiotics.  Later he developed abscess and completed a 5-day course of antibiotics. He had came in nature to mildly impacted intra-articular fracture of the distal radius in February 2023 His regular meds were stopped after enrolling in hospice   ED Course: received ivf vancomycin K was 2.7 WBC 11.4 BP soft Not tachycardic not febrile X-ray shows medial soft tissue gas likely representing known abscess which was drained today no other abnormalities.  X-ray of the right tibia and fibula. Review of Systems: As per HPI otherwise all other systems reviewed and are negative  Assessment & Plan:   Principal Problem:   Abscess Active Problems:   Memory difficulties   HYPOKALEMIA   Essential hypertension   CONSTIPATION, CHRONIC   Parkinson disease ([place])   #1 right calf abscess failed outpatient treatment with antibiotics and I&D. Appreciate Ortho and podiatry input. MRI of the right knee and right tibia pending. Continue IV vancomycin.  Monitor renal functions closely Leukocytosis improved Lactic acidosis improved  with fluids and IV antibiotics  #2 Parkinson's disease with dementia will restart home medications.  #3 hypokalemia resolved  #4 goals of care he is DNR/DNI and was on hospice prior to admission.  #5 stage II sacral pressure injury and right heel eschar and right ankle eschar present on admission appreciate wound care input.  #6 malnutrition dietary consulted  Pressure Injury 01/06/22 Sacrum Medial Stage 2 -  Partial thickness loss of dermis presenting as a shallow open injury with a red, pink wound bed without slough. redness w/part of epidermis missing (Active)  01/06/22 1800  Location: Sacrum  Location Orientation: Medial  Staging: Stage 2 -  Partial thickness loss of dermis presenting as a shallow open injury with a red, pink wound bed without slough.  Wound Description (Comments): redness w/part of epidermis missing  Present on Admission: Yes  Dressing Type Foam - Lift dressing to assess site every shift 01/06/22 2054     Pressure Injury 01/06/22 Heel Right Eschar sloughing with purulent drainage at opening. (Active)  01/06/22 1815  Location: Heel  Location Orientation: Right  Staging:   Wound Description (Comments): Eschar sloughing with purulent drainage at opening.  Present on Admission: Yes  Dressing Type Gauze (Comment);Other (Comment);ABD 01/07/22 0200     Pressure Injury 01/06/22 Ankle Right;Lateral Eschar intact to malleolous area w/ approx. 1cm of red induration..Eschar surrounded by a halo of white tissue. (Active)  01/06/22   Location: Ankle  Location Orientation: Right;Lateral  Staging:   Wound Description (Comments): Eschar intact to malleolous area w/ approx. 1cm of  red induration..Eschar surrounded by a halo of white tissue.  Present on Admission: Yes  Dressing Type Foam - Lift dressing to assess site every shift 01/06/22 2054     Estimated body mass index is 20.97 kg/m as calculated from the following:   Height as of this encounter: '5\' 9"'$  (1.753 m).    Weight as of this encounter: 64.4 kg.  DVT prophylaxis: Lovenox  code Status: DNR Family Communication: Discussed with son  disposition Plan:  Status is: Inpatient    Consultants:  Inpatient  Procedures: None Antimicrobials: Vancomycin  Subjective: Patient resting in bed no overnight events  Objective: Vitals:   01/06/22 2126 01/07/22 0144 01/07/22 0601 01/07/22 0932  BP:  110/65 (!) 145/63 (!) 166/71  Pulse:  (!) 48 (!) 51 (!) 58  Resp: '12 14 11 20  '$ Temp:  98.6 F (37 C) 98.6 F (37 C) 98.6 F (37 C)  TempSrc:  Oral Oral Oral  SpO2:  95% 99% 99%  Weight:      Height:        Intake/Output Summary (Last 24 hours) at 01/07/2022 1310 Last data filed at 01/07/2022 1000 Gross per 24 hour  Intake 1602.5 ml  Output 300 ml  Net 1302.5 ml   Filed Weights   01/06/22 1223  Weight: 64.4 kg    Examination:  General exam: Appears in no acute distress  respiratory system: Clear to auscultation. Respiratory effort normal. Cardiovascular system: S1 & S2 heard, RRR. No JVD, murmurs, rubs, gallops or clicks. No pedal edema. Gastrointestinal system: Abdomen is nondistended, soft and nontender. No organomegaly or masses felt. Normal bowel sounds heard. Central nervous system: Alert and oriented. No focal neurological deficits. Extremities: Right calf draining pus. Skin: Multiple decubitus as above  psychiatry: Judgement and insight appear normal. Mood & affect appropriate.     Data Reviewed: I have personally reviewed following labs and imaging studies  CBC: Recent Labs  Lab 01/06/22 1326 01/07/22 0423  WBC 11.4* 8.1  NEUTROABS 10.1*  --   HGB 11.1* 8.9*  HCT 32.3* 26.7*  MCV 93.1 96.0  PLT 362 932   Basic Metabolic Panel: Recent Labs  Lab 01/06/22 1326 01/07/22 0423  NA 143 143  K 2.8* 3.8  CL 102 106  CO2 34* 30  GLUCOSE 144* 96  BUN 15 15  CREATININE 0.59* 0.38*  CALCIUM 9.0 8.5*  MG 2.0  --    GFR: Estimated Creatinine Clearance: 71.6 mL/min (A) (by  C-G formula based on SCr of 0.38 mg/dL (L)). Liver Function Tests: Recent Labs  Lab 01/06/22 1326 01/07/22 0423  AST 14* 10*  ALT 10 11  ALKPHOS 135* 109  BILITOT 0.6 0.6  PROT 6.8 5.5*  ALBUMIN 2.5* 2.4*   No results for input(s): LIPASE, AMYLASE in the last 168 hours. No results for input(s): AMMONIA in the last 168 hours. Coagulation Profile: No results for input(s): INR, PROTIME in the last 168 hours. Cardiac Enzymes: No results for input(s): CKTOTAL, CKMB, CKMBINDEX, TROPONINI in the last 168 hours. BNP (last 3 results) No results for input(s): PROBNP in the last 8760 hours. HbA1C: No results for input(s): HGBA1C in the last 72 hours. CBG: No results for input(s): GLUCAP in the last 168 hours. Lipid Profile: No results for input(s): CHOL, HDL, LDLCALC, TRIG, CHOLHDL, LDLDIRECT in the last 72 hours. Thyroid Function Tests: No results for input(s): TSH, T4TOTAL, FREET4, T3FREE, THYROIDAB in the last 72 hours. Anemia Panel: No results for input(s): VITAMINB12, FOLATE, FERRITIN, TIBC, IRON,  RETICCTPCT in the last 72 hours. Sepsis Labs: Recent Labs  Lab 01/06/22 1326 01/06/22 1722 01/06/22 1954  LATICACIDVEN 1.9 2.4* 1.2    Recent Results (from the past 240 hour(s))  Blood culture (routine x 2)     Status: None (Preliminary result)   Collection Time: 01/06/22  1:17 PM   Specimen: BLOOD  Result Value Ref Range Status   Specimen Description   Final    BLOOD RIGHT ANTECUBITAL Blood Culture adequate volume BOTTLES DRAWN AEROBIC AND ANAEROBIC Performed at [facility] [address] [place], [place] [address]    Special Requests   Final    NONE Performed at [facility], [place] [address] [place], [place] [address]    Culture   Final    NO GROWTH < 12 HOURS Performed at [facility] [address] [place], [place] [address]    Report Status PENDING  Incomplete  Blood culture (routine x 2)     Status: None (Preliminary  result)   Collection Time: 01/06/22  4:00 PM   Specimen: BLOOD  Result Value Ref Range Status   Specimen Description   Final    BLOOD BLOOD LEFT ARM Performed at [facility] [address], [place], [place] [address]    Special Requests   Final    BOTTLES DRAWN AEROBIC AND ANAEROBIC Blood Culture adequate volume Performed at [facility] [address] [place], [place] [address]    Culture   Final    NO GROWTH < 12 HOURS Performed at [facility] [address] [place],  [address]    Report Status PENDING  Incomplete         Radiology Studies: DG Tibia/Fibula Right  Result Date: 01/06/2022 CLINICAL DATA:  Calf abscesses drain today. EXAM: RIGHT TIBIA AND FIBULA - 2 VIEW COMPARISON:  None Available. FINDINGS: There is gas in the MEDIAL soft tissues likely representing the known abscess which was drained today. No fracture, subluxation or dislocation identified. No knee effusion is present. No focal bony lesions or evidence of acute osteomyelitis noted. IMPRESSION: MEDIAL soft tissue gas likely representing the known abscess which was drained today. No other significant abnormalities. Electronically Signed   By: Margarette Canada M.D.   On: 01/06/2022 14:26        Scheduled Meds:  enoxaparin (LOVENOX) injection  40 mg Subcutaneous Q24H   Continuous Infusions:  sodium chloride 75 mL/hr at 01/07/22 0952   vancomycin 1,500 mg (01/06/22 2212)     LOS: 0 days    Time spent: 77 min  Georgette Shell, MD 01/07/2022, 1:10 PM

## 2022-01-07 NOTE — Progress Notes (Signed)
PHARMACY - PHYSICIAN COMMUNICATION CRITICAL VALUE ALERT - BLOOD CULTURE IDENTIFICATION (BCID)  [name]. is an 76 y.o. male who presented to [facility] on 01/06/2022 with a chief complaint of abscess  Assessment:  presumed contaminant with 1 of 4 bottles with staph species  Name of physician (or Provider) Contacted: Rodena Piety  Current antibiotics: vancomycin  Changes to prescribed antibiotics recommended:  Continuing vanc for abscess  Results for orders placed or performed during the hospital encounter of 01/06/22  Blood Culture ID Panel (Reflexed) (Collected: 01/06/2022  4:00 PM)  Result Value Ref Range   Enterococcus faecalis NOT DETECTED NOT DETECTED   Enterococcus Faecium NOT DETECTED NOT DETECTED   Listeria monocytogenes NOT DETECTED NOT DETECTED   Staphylococcus species DETECTED (A) NOT DETECTED   Staphylococcus aureus (BCID) NOT DETECTED NOT DETECTED   Staphylococcus epidermidis NOT DETECTED NOT DETECTED   Staphylococcus lugdunensis NOT DETECTED NOT DETECTED   Streptococcus species NOT DETECTED NOT DETECTED   Streptococcus agalactiae NOT DETECTED NOT DETECTED   Streptococcus pneumoniae NOT DETECTED NOT DETECTED   Streptococcus pyogenes NOT DETECTED NOT DETECTED   A.calcoaceticus-baumannii NOT DETECTED NOT DETECTED   Bacteroides fragilis NOT DETECTED NOT DETECTED   Enterobacterales NOT DETECTED NOT DETECTED   Enterobacter cloacae complex NOT DETECTED NOT DETECTED   Escherichia coli NOT DETECTED NOT DETECTED   Klebsiella aerogenes NOT DETECTED NOT DETECTED   Klebsiella oxytoca NOT DETECTED NOT DETECTED   Klebsiella pneumoniae NOT DETECTED NOT DETECTED   Proteus species NOT DETECTED NOT DETECTED   Salmonella species NOT DETECTED NOT DETECTED   Serratia marcescens NOT DETECTED NOT DETECTED   Haemophilus influenzae NOT DETECTED NOT DETECTED   Neisseria meningitidis NOT DETECTED NOT DETECTED   Pseudomonas aeruginosa NOT DETECTED NOT DETECTED   Stenotrophomonas  maltophilia NOT DETECTED NOT DETECTED   Candida albicans NOT DETECTED NOT DETECTED   Candida auris NOT DETECTED NOT DETECTED   Candida glabrata NOT DETECTED NOT DETECTED   Candida krusei NOT DETECTED NOT DETECTED   Candida parapsilosis NOT DETECTED NOT DETECTED   Candida tropicalis NOT DETECTED NOT DETECTED   Cryptococcus neoformans/gattii NOT DETECTED NOT DETECTED    Kara Mead 01/07/2022  4:12 PM

## 2022-01-08 ENCOUNTER — Inpatient Hospital Stay (HOSPITAL_COMMUNITY): Payer: PPO

## 2022-01-08 DIAGNOSIS — L8990 Pressure ulcer of unspecified site, unspecified stage: Secondary | ICD-10-CM | POA: Insufficient documentation

## 2022-01-08 DIAGNOSIS — L0291 Cutaneous abscess, unspecified: Secondary | ICD-10-CM | POA: Diagnosis not present

## 2022-01-08 MED ORDER — LIDOCAINE HCL 1 % IJ SOLN
INTRAMUSCULAR | Status: AC
Start: 2022-01-08 — End: 2022-01-08
  Administered 2022-01-08: 5 mL
  Filled 2022-01-08: qty 20

## 2022-01-08 MED ORDER — CITALOPRAM HYDROBROMIDE 20 MG PO TABS
30.0000 mg | ORAL_TABLET | Freq: Every day | ORAL | Status: DC
  Administered 2022-01-08 – 2022-01-12 (×5): 30 mg via ORAL
  Filled 2022-01-08 (×5): qty 2

## 2022-01-08 MED ORDER — ENTACAPONE 200 MG PO TABS
200.0000 mg | ORAL_TABLET | Freq: Three times a day (TID) | ORAL | Status: DC
  Administered 2022-01-08 – 2022-01-12 (×13): 200 mg via ORAL
  Filled 2022-01-08 (×14): qty 1

## 2022-01-08 MED ORDER — MEMANTINE HCL 10 MG PO TABS
10.0000 mg | ORAL_TABLET | Freq: Two times a day (BID) | ORAL | Status: DC
  Administered 2022-01-08 – 2022-01-12 (×9): 10 mg via ORAL
  Filled 2022-01-08 (×9): qty 1

## 2022-01-08 MED ORDER — CARBIDOPA-LEVODOPA 25-100 MG PO TABS
1.0000 | ORAL_TABLET | Freq: Three times a day (TID) | ORAL | Status: DC
  Administered 2022-01-08 – 2022-01-12 (×13): 1 via ORAL
  Filled 2022-01-08 (×14): qty 1

## 2022-01-08 NOTE — Progress Notes (Addendum)
PROGRESS NOTE    [name].  IWP:[id number] DOB: 1945-12-11 DOA: 01/06/2022 PCP: Eulas Post, MD   Brief Narrative:  [name]. is a 76 y.o. male with medical history significant of Parkinson's disease, dementia he was sent in by the staff concerning abscess on his right posterior aspect of his right leg and heal  This was drained by hospice nurse at the facility and the ED physician in the ER checked on it and was still draining pus.  Patient had a fall or had multiple falls prior to admission and had a femoral head fracture about 6 weeks ago and has been bedbound since.  No fever chills nausea vomiting diarrhea no cough or shortness of breath he has been gradually declining since his hip fracture is a hospice consult was placed last month and he is followed by [facility]. Patient recently had right lower extremity cellulitis which was treated with oral antibiotics.  Later he developed abscess and completed a 5-day course of antibiotics. He had came in nature to mildly impacted intra-articular fracture of the distal radius in February 2023 His regular meds were stopped after enrolling in hospice   ED Course: received ivf vancomycin K was 2.7 WBC 11.4 BP soft Not tachycardic not febrile X-ray shows medial soft tissue gas likely representing known abscess which was drained today no other abnormalities.  X-ray of the right tibia and fibula. Review of Systems: As per HPI otherwise all other systems reviewed and are negative  Assessment & Plan:   Principal Problem:   Abscess Active Problems:   Memory difficulties   HYPOKALEMIA   Essential hypertension   CONSTIPATION, CHRONIC   Parkinson disease ([place])   #1 right calf abscess failed outpatient treatment with antibiotics and I&D. Bcid 1/4 staph species Appreciate Ortho and podiatry input. MRI of the right knee and right tibia -subcutaneous abscess along the posterior proximal calf measuring 3.5 x 0.9 x 5.4 cm.   Additional probable abscess measuring 1.4 x 0.7 x 5.5 cm at the superficial myofascial interface of the medial soleus distally.  Diffuse intramuscular edema with streaky atrophy and more confluent fatty atrophy of the medial lateral gastrinomas muscle may represent myositis and or denervation change. IR consult 01/08/2022 for incision and drainage of the abscesses. Continue IV vancomycin.   Lactic acidosis and leukocytosis improved with fluids and IV antibiotics  #2 Parkinson's disease with dementia will restart home medications.  #3 hypokalemia resolved  #4 goals of care he is DNR/DNI and was on hospice prior to admission.  #5 stage II sacral pressure injury and right heel eschar and right ankle eschar present on admission appreciate wound care input.  #6 malnutrition dietary consulted  Pressure Injury 01/06/22 Sacrum Medial Stage 2 -  Partial thickness loss of dermis presenting as a shallow open injury with a red, pink wound bed without slough. redness w/part of epidermis missing (Active)  01/06/22 1800  Location: Sacrum  Location Orientation: Medial  Staging: Stage 2 -  Partial thickness loss of dermis presenting as a shallow open injury with a red, pink wound bed without slough.  Wound Description (Comments): redness w/part of epidermis missing  Present on Admission: Yes  Dressing Type Foam - Lift dressing to assess site every shift 01/08/22 0802     Pressure Injury 01/06/22 Heel Right Eschar sloughing with purulent drainage at opening. (Active)  01/06/22 1815  Location: Heel  Location Orientation: Right  Staging:   Wound Description (Comments): Eschar sloughing with purulent  drainage at opening.  Present on Admission: Yes  Dressing Type Gauze (Comment);ABD;Other (Comment) 01/07/22 1951     Pressure Injury 01/06/22 Ankle Right;Lateral Eschar intact to malleolous area w/ approx. 1cm of red induration..Eschar surrounded by a halo of white tissue. (Active)  01/06/22   Location:  Ankle  Location Orientation: Right;Lateral  Staging:   Wound Description (Comments): Eschar intact to malleolous area w/ approx. 1cm of red induration..Eschar surrounded by a halo of white tissue.  Present on Admission: Yes  Dressing Type Gauze (Comment);ABD;Other (Comment) 01/07/22 1951     Estimated body mass index is 20.97 kg/m as calculated from the following:   Height as of this encounter: '5\' 9"'$  (1.753 m).   Weight as of this encounter: 64.4 kg.  DVT prophylaxis: Lovenox  code Status: DNR Family Communication: Discussed with son  disposition Plan:  Status is: Inpatient    Consultants:  Inpatient  Procedures: None Antimicrobials: Vancomycin  Subjective: Patient resting in bed no overnight events Patient has been as coming out of the right calf.  Covered with a dressing.  Objective: Vitals:   01/07/22 0932 01/07/22 1404 01/07/22 2123 01/08/22 0536  BP: (!) 166/71 (!) 165/75 (!) 164/71 (!) 152/69  Pulse: (!) 58 61 (!) 57 (!) 58  Resp: '20 18 11 10  '$ Temp: 98.6 F (37 C) 98.5 F (36.9 C) 98 F (36.7 C) 98.4 F (36.9 C)  TempSrc: Oral Oral Oral Oral  SpO2: 99% 100% 99% 99%  Weight:      Height:        Intake/Output Summary (Last 24 hours) at 01/08/2022 1108 Last data filed at 01/08/2022 1020 Gross per 24 hour  Intake 2085.52 ml  Output 1700 ml  Net 385.52 ml    Filed Weights   01/06/22 1223  Weight: 64.4 kg    Examination:  General exam: Appears in no acute distress  respiratory system: Clear to auscultation. Respiratory effort normal. Cardiovascular system: S1 & S2 heard, RRR. No JVD, murmurs, rubs, gallops or clicks. No pedal edema. Gastrointestinal system: Abdomen is nondistended, soft and nontender. No organomegaly or masses felt. Normal bowel sounds heard. Central nervous system: Alert and oriented. No focal neurological deficits. Extremities: Right calf draining pus. Skin: Multiple decubitus as above  psychiatry: Judgement and insight appear  normal. Mood & affect appropriate.     Data Reviewed: I have personally reviewed following labs and imaging studies  CBC: Recent Labs  Lab 01/06/22 1326 01/07/22 0423  WBC 11.4* 8.1  NEUTROABS 10.1*  --   HGB 11.1* 8.9*  HCT 32.3* 26.7*  MCV 93.1 96.0  PLT 362 742    Basic Metabolic Panel: Recent Labs  Lab 01/06/22 1326 01/07/22 0423  NA 143 143  K 2.8* 3.8  CL 102 106  CO2 34* 30  GLUCOSE 144* 96  BUN 15 15  CREATININE 0.59* 0.38*  CALCIUM 9.0 8.5*  MG 2.0  --     GFR: Estimated Creatinine Clearance: 71.6 mL/min (A) (by C-G formula based on SCr of 0.38 mg/dL (L)). Liver Function Tests: Recent Labs  Lab 01/06/22 1326 01/07/22 0423  AST 14* 10*  ALT 10 11  ALKPHOS 135* 109  BILITOT 0.6 0.6  PROT 6.8 5.5*  ALBUMIN 2.5* 2.4*    No results for input(s): LIPASE, AMYLASE in the last 168 hours. No results for input(s): AMMONIA in the last 168 hours. Coagulation Profile: No results for input(s): INR, PROTIME in the last 168 hours. Cardiac Enzymes: No results for input(s): CKTOTAL, CKMB,  CKMBINDEX, TROPONINI in the last 168 hours. BNP (last 3 results) No results for input(s): PROBNP in the last 8760 hours. HbA1C: No results for input(s): HGBA1C in the last 72 hours. CBG: No results for input(s): GLUCAP in the last 168 hours. Lipid Profile: No results for input(s): CHOL, HDL, LDLCALC, TRIG, CHOLHDL, LDLDIRECT in the last 72 hours. Thyroid Function Tests: No results for input(s): TSH, T4TOTAL, FREET4, T3FREE, THYROIDAB in the last 72 hours. Anemia Panel: No results for input(s): VITAMINB12, FOLATE, FERRITIN, TIBC, IRON, RETICCTPCT in the last 72 hours. Sepsis Labs: Recent Labs  Lab 01/06/22 1326 01/06/22 1722 01/06/22 1954  LATICACIDVEN 1.9 2.4* 1.2     Recent Results (from the past 240 hour(s))  Blood culture (routine x 2)     Status: None (Preliminary result)   Collection Time: 01/06/22  1:17 PM   Specimen: BLOOD  Result Value Ref Range Status    Specimen Description   Final    BLOOD RIGHT ANTECUBITAL Blood Culture adequate volume BOTTLES DRAWN AEROBIC AND ANAEROBIC Performed at [facility] [address] [place], [place] [address]    Special Requests   Final    NONE Performed at [facility], [place] [address] [place], [place] [address]    Culture   Final    NO GROWTH 2 DAYS Performed at [facility] [address] [place], [place] [address]    Report Status PENDING  Incomplete  Blood culture (routine x 2)     Status: None (Preliminary result)   Collection Time: 01/06/22  4:00 PM   Specimen: BLOOD  Result Value Ref Range Status   Specimen Description   Final    BLOOD BLOOD LEFT ARM Performed at [facility] [address], [place], [place] [address]    Special Requests   Final    BOTTLES DRAWN AEROBIC AND ANAEROBIC Blood Culture adequate volume Performed at [facility] [address] [place], [place] [address]    Culture  Setup Time   Final    GRAM POSITIVE COCCI IN CLUSTERS IN BOTH AEROBIC AND ANAEROBIC BOTTLES CRITICAL RESULT CALLED TO, READ BACK BY AND VERIFIED WITH: PHARMD JUSTIN LEGGE ON 01/07/22 @ 1606 BY DRT Performed at [facility], [place] [address] [place], [place] [address]    Culture GRAM POSITIVE COCCI  Final   Report Status PENDING  Incomplete  Blood Culture ID Panel (Reflexed)     Status: Abnormal   Collection Time: 01/06/22  4:00 PM  Result Value Ref Range Status   Enterococcus faecalis NOT DETECTED NOT DETECTED Final   Enterococcus Faecium NOT DETECTED NOT DETECTED Final   Listeria monocytogenes NOT DETECTED NOT DETECTED Final   Staphylococcus species DETECTED (A) NOT DETECTED Final    Comment: CRITICAL RESULT CALLED TO, READ BACK BY AND VERIFIED WITH: PHARMD JUSTIN LEGGE ON 01/07/22 @ 1606 BY DRT    Staphylococcus aureus (BCID) NOT DETECTED NOT DETECTED Final   Staphylococcus epidermidis NOT DETECTED  NOT DETECTED Final   Staphylococcus lugdunensis NOT DETECTED NOT DETECTED Final   Streptococcus species NOT DETECTED NOT DETECTED Final   Streptococcus agalactiae NOT DETECTED NOT DETECTED Final   Streptococcus pneumoniae NOT DETECTED NOT DETECTED Final   Streptococcus pyogenes NOT DETECTED NOT DETECTED Final   A.calcoaceticus-baumannii NOT DETECTED NOT DETECTED Final   Bacteroides fragilis NOT DETECTED NOT DETECTED Final   Enterobacterales NOT DETECTED NOT DETECTED Final   Enterobacter cloacae complex NOT DETECTED NOT DETECTED Final  Escherichia coli NOT DETECTED NOT DETECTED Final   Klebsiella aerogenes NOT DETECTED NOT DETECTED Final   Klebsiella oxytoca NOT DETECTED NOT DETECTED Final   Klebsiella pneumoniae NOT DETECTED NOT DETECTED Final   Proteus species NOT DETECTED NOT DETECTED Final   Salmonella species NOT DETECTED NOT DETECTED Final   Serratia marcescens NOT DETECTED NOT DETECTED Final   Haemophilus influenzae NOT DETECTED NOT DETECTED Final   Neisseria meningitidis NOT DETECTED NOT DETECTED Final   Pseudomonas aeruginosa NOT DETECTED NOT DETECTED Final   Stenotrophomonas maltophilia NOT DETECTED NOT DETECTED Final   Candida albicans NOT DETECTED NOT DETECTED Final   Candida auris NOT DETECTED NOT DETECTED Final   Candida glabrata NOT DETECTED NOT DETECTED Final   Candida krusei NOT DETECTED NOT DETECTED Final   Candida parapsilosis NOT DETECTED NOT DETECTED Final   Candida tropicalis NOT DETECTED NOT DETECTED Final   Cryptococcus neoformans/gattii NOT DETECTED NOT DETECTED Final    Comment: Performed at [facility], [place] [address] [place], [place] [address]  Aerobic/Anaerobic Culture w Gram Stain (surgical/deep wound)     Status: None (Preliminary result)   Collection Time: 01/06/22  4:18 PM   Specimen: Abscess  Result Value Ref Range Status   Specimen Description   Final    ABSCESS Performed at [facility] [address]  [place], [place] [address]    Special Requests   Final    NONE Performed at [facility], [place] [address], [place], [place] [address]    Gram Stain   Final    NO SQUAMOUS EPITHELIAL CELLS SEEN NO WBC SEEN FEW GRAM POSITIVE COCCI Performed at [facility], [place] [address], [place], [place] [address]    Culture PENDING  Incomplete   Report Status PENDING  Incomplete          Radiology Studies: DG Tibia/Fibula Right  Result Date: 01/06/2022 CLINICAL DATA:  Calf abscesses drain today. EXAM: RIGHT TIBIA AND FIBULA - 2 VIEW COMPARISON:  None Available. FINDINGS: There is gas in the MEDIAL soft tissues likely representing the known abscess which was drained today. No fracture, subluxation or dislocation identified. No knee effusion is present. No focal bony lesions or evidence of acute osteomyelitis noted. IMPRESSION: MEDIAL soft tissue gas likely representing the known abscess which was drained today. No other significant abnormalities. Electronically Signed   By: Margarette Canada M.D.   On: 01/06/2022 14:26   MR TIBIA FIBULA RIGHT W WO CONTRAST  Result Date: 01/08/2022 CLINICAL DATA:  Abscess EXAM: MRI OF LOWER RIGHT EXTREMITY WITHOUT AND WITH CONTRAST TECHNIQUE: Multiplanar, multisequence MR imaging of the right lower extremity was performed both before and after administration of intravenous contrast. CONTRAST:  45m GADAVIST GADOBUTROL 1 MMOL/ML IV SOLN COMPARISON:  Tibia fibula radiograph 01/06/2022 FINDINGS: Bones/Joint/Cartilage The cortex is intact. There is no significant marrow signal alteration. There is a small right knee joint effusion without significant synovial enhancement. Ligaments The interosseous membrane is unremarkable. Muscles and Tendons There is diffuse intramuscular edema and streaky atrophy. There is more confluent fatty atrophy of the medial and lateral gastrocnemius muscles. There is partially visualized peritendinitis of the proximal Achilles. Soft  tissues There is superficial soft tissue swelling along the lower leg. There is a subcutaneous, rim enhancing fluid collection with punctate focus of gas along the proximal posterior calf measuring 3.5 x 0.9 x 5.4 cm (post-contrast axial image 24, sagittal image 13). Distally, there is an additional rim enhancing fluid collection which  measures 1.4 x 0.7 x 5.5 cm at the superficial myofascial interface of the medial soleus (series 6, image 38, series 14, image 15). IMPRESSION: Subcutaneous abscess along the proximal posterior calf measuring 3.5 x 0.9 x 5.4 cm. Additional probable abscess measuring 1.4 x 0.7 x 5.5 cm at the superficial myofascial interface of the medial soleus distally. Diffuse intramuscular edema with streaky atrophy and more confluent fatty atrophy of the mediolateral gastrocnemius muscles. This could represent myositis and/or denervation change. No evidence of osteomyelitis. Electronically Signed   By: Maurine Simmering M.D.   On: 01/08/2022 08:15        Scheduled Meds:  enoxaparin (LOVENOX) injection  40 mg Subcutaneous Q24H   Continuous Infusions:  sodium chloride 75 mL/hr at 01/08/22 0150   vancomycin 1,500 mg (01/07/22 2104)     LOS: 1 day    Time spent: 38 min  Georgette Shell, MD 01/08/2022, 11:08 AM

## 2022-01-08 NOTE — Progress Notes (Signed)
Spoke with pt son, [name], about request made to aspirate fluid collection from right calf with local anesthetic. [name] gave verbal consent over telephone to proceed with aspiration. Consent signed and in APP office.    Narda Rutherford, AGNP-BC 01/08/2022, 1:34 PM

## 2022-01-08 NOTE — Procedures (Signed)
Interventional Radiology Procedure Note  Procedure: US guided aspiration of right calf  Complications: None  Estimated Blood Loss: < 5 mL  Findings: By [place], no focal abscess in right calf. Thin area of subcutaneous fluid in proximal right calf targeted yielding <1 mL of thick fluid/debris. Lavage with 2 mL of sterile saline did not yield any significant additional return. Small amount of fluid sent for culture.  Venetia Night. Kathlene Cote, M.D Pager:  [phone]

## 2022-01-08 NOTE — TOC Progression Note (Signed)
Transition of Care (TOC) - Progression Note    Patient Details  Name: [name]. MRN: [id number] Date of Birth: [birth year]  Transition of Care [facility] CM/SW Contact  Lennart Pall, LCSW Phone Number: 01/08/2022, 1:30 PM  Clinical Narrative:     Spoke with Lemar Livings, RN at [facility] [phone]) who confirms plans for pt to return to their ALF bed when medically cleared.  IF pt would need IV abx, then this could be a barrier but can discuss.  Will need updated FL2 and summary faxed prior to readmission.  Expected Discharge Plan: Assisted Living Barriers to Discharge: Continued Medical Work up  Expected Discharge Plan and Services Expected Discharge Plan: Assisted Living In-house Referral: Clinical Social Work     Living arrangements for the past 2 months: [place] ([place])                                       Social Determinants of Health (SDOH) Interventions    Readmission Risk Interventions     View : No data to display.

## 2022-01-09 DIAGNOSIS — L0291 Cutaneous abscess, unspecified: Secondary | ICD-10-CM | POA: Diagnosis not present

## 2022-01-09 DIAGNOSIS — G20 Parkinson's disease: Secondary | ICD-10-CM

## 2022-01-09 DIAGNOSIS — E876 Hypokalemia: Secondary | ICD-10-CM

## 2022-01-09 DIAGNOSIS — I10 Essential (primary) hypertension: Secondary | ICD-10-CM | POA: Diagnosis not present

## 2022-01-09 DIAGNOSIS — Z8781 Personal history of (healed) traumatic fracture: Secondary | ICD-10-CM | POA: Diagnosis not present

## 2022-01-09 LAB — COMPREHENSIVE METABOLIC PANEL
ALT: 7 U/L (ref 0–44)
AST: 15 U/L (ref 15–41)
Albumin: 2.5 g/dL — ABNORMAL LOW (ref 3.5–5.0)
Alkaline Phosphatase: 128 U/L — ABNORMAL HIGH (ref 38–126)
Anion gap: 10 (ref 5–15)
BUN: 8 mg/dL (ref 8–23)
CO2: 28 mmol/L (ref 22–32)
Calcium: 8.3 mg/dL — ABNORMAL LOW (ref 8.9–10.3)
Chloride: 101 mmol/L (ref 98–111)
Creatinine, Ser: 0.53 mg/dL — ABNORMAL LOW (ref 0.61–1.24)
GFR, Estimated: >60 mL/min (ref >60)
Glucose, Bld: 100 mg/dL — ABNORMAL HIGH (ref 70–99)
Potassium: 2.5 mmol/L — CL (ref 3.5–5.1)
Sodium: 139 mmol/L (ref 135–145)
Total Bilirubin: 0.9 mg/dL (ref 0.3–1.2)
Total Protein: 6.5 g/dL (ref 6.5–8.1)

## 2022-01-09 LAB — CBC
HCT: 32 % — ABNORMAL LOW (ref 39.0–52.0)
Hemoglobin: 10.8 g/dL — ABNORMAL LOW (ref 13.0–17.0)
MCH: 31.1 pg (ref 26.0–34.0)
MCHC: 33.8 g/dL (ref 30.0–36.0)
MCV: 92.2 fL (ref 80.0–100.0)
Platelets: 278 10*3/uL (ref 150–400)
RBC: 3.47 MIL/uL — ABNORMAL LOW (ref 4.22–5.81)
RDW: 14.2 % (ref 11.5–15.5)
WBC: 7 10*3/uL (ref 4.0–10.5)
nRBC: 0 % (ref 0.0–0.2)

## 2022-01-09 LAB — CULTURE, BLOOD (ROUTINE X 2): Special Requests: ADEQUATE

## 2022-01-09 MED ORDER — JUVEN PO PACK
1.0000 | PACK | Freq: Two times a day (BID) | ORAL | Status: DC
  Administered 2022-01-09 – 2022-01-12 (×6): 1 via ORAL
  Filled 2022-01-09 (×4): qty 1

## 2022-01-09 MED ORDER — ZINC SULFATE 220 (50 ZN) MG PO CAPS
220.0000 mg | ORAL_CAPSULE | Freq: Every day | ORAL | Status: DC
Start: 2022-01-09 — End: 2022-01-12
  Administered 2022-01-09 – 2022-01-12 (×4): 220 mg via ORAL
  Filled 2022-01-09 (×4): qty 1

## 2022-01-09 MED ORDER — ENSURE ENLIVE PO LIQD
237.0000 mL | Freq: Two times a day (BID) | ORAL | Status: DC
  Administered 2022-01-09 – 2022-01-12 (×5): 237 mL via ORAL

## 2022-01-09 MED ORDER — POTASSIUM CHLORIDE 20 MEQ PO PACK
40.0000 meq | PACK | Freq: Two times a day (BID) | ORAL | Status: AC
  Administered 2022-01-09 (×2): 40 meq via ORAL
  Filled 2022-01-09 (×2): qty 2

## 2022-01-09 MED ORDER — ADULT MULTIVITAMIN W/MINERALS CH
1.0000 | ORAL_TABLET | Freq: Every day | ORAL | Status: DC
  Administered 2022-01-09 – 2022-01-12 (×4): 1 via ORAL
  Filled 2022-01-09 (×4): qty 1

## 2022-01-09 MED ORDER — POTASSIUM CHLORIDE 20 MEQ PO PACK
60.0000 meq | PACK | Freq: Once | ORAL | Status: AC
  Administered 2022-01-09: 60 meq via ORAL
  Filled 2022-01-09: qty 3

## 2022-01-09 MED ORDER — ASCORBIC ACID 500 MG PO TABS
500.0000 mg | ORAL_TABLET | Freq: Every day | ORAL | Status: DC
  Administered 2022-01-09 – 2022-01-12 (×4): 500 mg via ORAL
  Filled 2022-01-09 (×4): qty 1

## 2022-01-09 NOTE — Progress Notes (Signed)
[facility]  PROGRESS NOTE  [name]. UXL:[id number] DOB: February 13, 1946 DOA: 01/06/2022 PCP: Eulas Post, MD   Brief HPI:   76 year old male with medical history of Parkinson disease, dementia was sent from assisted living facility concerning abscess on right posterior aspect of right leg and heel.  This was drained by hospice nurse at the facility and by the ED provider in the ED checked on it and was still draining pus.  Patient had a fall prior to admission and had femoral neck fracture about 6 weeks ago and has been bedbound since then.  Since patient was gradually declining since hip fracture, hospice consult was placed last month.  Patient is currently followed by [facility].  Patient recently had lower extremity cellulitis which was treated with oral antibiotics.  Later he developed abscess and completed 5 days course of antibiotics.  His appointment was stopped after enrolling in hospice.   Subjective   Patient seen and examined underwent incision and drainage of right calf abscess per IR.  Pain reasonably well controlled.   Assessment/Plan:    Right calf abscess -Failed outpatient treatment with antibiotics and incision and drainage -Blood culture ID 1/4 staph species -Orthopedics and podiatry were consulted MRI of the right knee and right tibia -subcutaneous abscess along the posterior proximal calf measuring 3.5 x 0.9 x 5.4 cm.  Additional probable abscess measuring 1.4 x 0.7 x 5.5 cm at the superficial myofascial interface of the medial soleus distally.  Diffuse intramuscular edema with streaky atrophy and more confluent fatty atrophy of the medial lateral gastrinomas muscle may represent myositis and or denervation change. -IR consulted 01/08/2022 and patient underwent incision and drainage of right calf abscess  Patient started on vancomycin -We will consult ID for further guidance on antibiotic management  Parkinson disease with  dementia -Stable -Continue home medications  Hypokalemia -Potassium was 2.5 -Replace potassium and follow BMP in am  Stage II sacral pressure ulcer injury with right heel eschar/right ankle eschar POA -Wound care consulted  Goals of care -Patient is DNR/DNI -Patient was on hospice prior to admission     Medications     vitamin C  500 mg Oral Daily   carbidopa-levodopa  1 tablet Oral TID WC   citalopram  30 mg Oral Daily   enoxaparin (LOVENOX) injection  40 mg Subcutaneous Q24H   entacapone  200 mg Oral TID WC   feeding supplement  237 mL Oral BID BM   memantine  10 mg Oral BID   multivitamin with minerals  1 tablet Oral Daily   nutrition supplement (JUVEN)  1 packet Oral BID WC   potassium chloride  40 mEq Oral BID   zinc sulfate  220 mg Oral Daily     Data Reviewed:   CBG:  No results for input(s): GLUCAP in the last 168 hours.  SpO2: 100 %    Vitals:   01/08/22 1237 01/08/22 2149 01/09/22 0433 01/09/22 1318  BP: 130/60 139/66 (!) 153/62 127/80  Pulse: 62 (!) 53 (!) 54 (!) 109  Resp: '16 16 16 16  '$ Temp: 98.4 F (36.9 C) 97.7 F (36.5 C) 97.6 F (36.4 C) 97.8 F (36.6 C)  TempSrc:  Oral    SpO2: 100% 100% 100% 100%  Weight:      Height:          Data Reviewed:  Basic Metabolic Panel: Recent Labs  Lab 01/06/22 1326 01/07/22 0423 01/09/22 0416  NA 143 143 139  K  2.8* 3.8 2.5*  CL 102 106 101  CO2 34* 30 28  GLUCOSE 144* 96 100*  BUN '15 15 8  '$ CREATININE 0.59* 0.38* 0.53*  CALCIUM 9.0 8.5* 8.3*  MG 2.0  --   --     CBC: Recent Labs  Lab 01/06/22 1326 01/07/22 0423 01/09/22 0416  WBC 11.4* 8.1 7.0  NEUTROABS 10.1*  --   --   HGB 11.1* 8.9* 10.8*  HCT 32.3* 26.7* 32.0*  MCV 93.1 96.0 92.2  PLT 362 238 278    LFT Recent Labs  Lab 01/06/22 1326 01/07/22 0423 01/09/22 0416  AST 14* 10* 15  ALT '10 11 7  '$ ALKPHOS 135* 109 128*  BILITOT 0.6 0.6 0.9  PROT 6.8 5.5* 6.5  ALBUMIN 2.5* 2.4* 2.5*     Antibiotics: Anti-infectives  (From admission, onward)    Start     Dose/Rate Route Frequency Ordered Stop   01/06/22 2200  vancomycin (VANCOREADY) IVPB 1500 mg/300 mL        1,500 mg 150 mL/hr over 120 Minutes Intravenous Every 24 hours 01/06/22 1728     01/06/22 1615  vancomycin (VANCOCIN) IVPB 1000 mg/200 mL premix  Status:  Discontinued        1,000 mg 200 mL/hr over 60 Minutes Intravenous  Once 01/06/22 1611 01/06/22 1613   01/06/22 1600  vancomycin (VANCOCIN) IVPB 1000 mg/200 mL premix        1,000 mg 200 mL/hr over 60 Minutes Intravenous  Once 01/06/22 1557 01/06/22 1709        DVT prophylaxis: Lovenox  Code Status: DNR  Family Communication: No family at bedside   CONSULTS IR, orthopedics   Objective    Physical Examination:   General-appears in no acute distress Heart-S1-S2, regular, no murmur auscultated Lungs-clear to auscultation bilaterally, no wheezing or crackles auscultated Abdomen-soft, nontender, no organomegaly Extremities-right lower extremity in dressing Neuro-alert, oriented x3, no focal deficit noted   Status is: Inpatient: Right calf abscess    Pressure Injury 01/06/22 Sacrum Medial Stage 2 -  Partial thickness loss of dermis presenting as a shallow open injury with a red, pink wound bed without slough. redness w/part of epidermis missing (Active)  01/06/22 1800  Location: Sacrum  Location Orientation: Medial  Staging: Stage 2 -  Partial thickness loss of dermis presenting as a shallow open injury with a red, pink wound bed without slough.  Wound Description (Comments): redness w/part of epidermis missing  Present on Admission: Yes     Pressure Injury 01/06/22 Heel Right Eschar sloughing with purulent drainage at opening. (Active)  01/06/22 1815  Location: Heel  Location Orientation: Right  Staging:   Wound Description (Comments): Eschar sloughing with purulent drainage at opening.  Present on Admission: Yes     Pressure Injury 01/06/22 Ankle Right;Lateral  Eschar intact to malleolous area w/ approx. 1cm of red induration..Eschar surrounded by a halo of white tissue. (Active)  01/06/22   Location: Ankle  Location Orientation: Right;Lateral  Staging:   Wound Description (Comments): Eschar intact to malleolous area w/ approx. 1cm of red induration..Eschar surrounded by a halo of white tissue.  Present on Admission: Yes        [place]   [facility] If 7PM-7AM, please contact night-coverage at [email or web address], Office  [phone]   01/09/2022, 2:47 PM  LOS: 2 days

## 2022-01-09 NOTE — Progress Notes (Signed)
Date and time results received: 01/09/22 0445 am (use smartphrase ".now" to insert current time)  Test: K+ Critical Value: K+ 2.5  Name of Provider Notified: Gershon Cull  Orders Received? Or Actions Taken?: Orders received  and will carried out.

## 2022-01-09 NOTE — Progress Notes (Signed)
Initial Nutrition Assessment  DOCUMENTATION CODES:   Non-severe (moderate) malnutrition in context of chronic illness  INTERVENTION:   -Ensure Plus High Protein po BID, each supplement provides 350 kcal and 20 grams of protein.   -1 packet Juven BID, each packet provides 95 calories, 2.5 grams of protein (collagen), and 9.8 grams of carbohydrate (3 grams sugar); also contains 7 grams of L-arginine and L-glutamine, 300 mg vitamin C, 15 mg vitamin E, 1.2 mcg vitamin B-12, 9.5 mg zinc, 200 mg calcium, and 1.5 g  Calcium Beta-hydroxy-Beta-methylbutyrate to support wound healing   -Multivitamin with minerals daily  -500 mg Vitamin C daily -220 mg zinc sulfate daily x 14 days  NUTRITION DIAGNOSIS:   Moderate Malnutrition related to chronic illness (Parkinsons disease) as evidenced by moderate fat depletion, severe muscle depletion.  GOAL:   Patient will meet greater than or equal to 90% of their needs  MONITOR:   PO intake, Supplement acceptance, Labs, Weight trends, I & O's, Skin  REASON FOR ASSESSMENT:   Consult Assessment of nutrition requirement/status  ASSESSMENT:   76 y.o. male with medical history significant of Parkinson's disease, dementia he was sent in by the staff concerning abscess on his right posterior aspect of his right leg and heel. Admitted for treatment of right calf abscess.  5/23: s/p US guided aspiration of right calf  Patient in room, received lunch meal during visit. Alert/oriented x 1. Was unable to provide any history or answer questions appropriately.  Per chart review, pt has been having falls and was diagnosed with a hip fracture ~6 weeks ago. Has been bedbound since.  Enrolled in hospice.   Currently consuming 50-100% of meals.  Will order Ensure as well as Juven for wound healing.  Per weight records, pt weighed 145 lbs.  Medications: KLOR-CON  Labs reviewed:  Low K  NUTRITION - FOCUSED PHYSICAL EXAM:  Flowsheet Row Most Recent Value   Orbital Region Moderate depletion  Upper Arm Region Severe depletion  Thoracic and Lumbar Region Moderate depletion  Buccal Region Moderate depletion  Temple Region Moderate depletion  Clavicle Bone Region Severe depletion  Clavicle and Acromion Bone Region Moderate depletion  Scapular Bone Region Moderate depletion  Dorsal Hand Unable to assess  [swollen]  Patellar Region Severe depletion  Anterior Thigh Region Severe depletion  Posterior Calf Region Severe depletion  Edema (RD Assessment) None  Hair Reviewed  [thin]  Eyes Reviewed  Mouth Reviewed  Skin Reviewed  [bruising of BUEs]  Nails Reviewed       Diet Order:   Diet Order             DIET SOFT Room service appropriate? Yes; Fluid consistency: Thin  Diet effective now                   EDUCATION NEEDS:   Not appropriate for education at this time  Skin:  Skin Assessment: Skin Integrity Issues: Skin Integrity Issues:: Stage II, Other (Comment) Stage II: medial sacrum Other: Right calf abscess, right heel wound, right ankle wound  Last BM:  5/23 -type 6  Height:   Ht Readings from Last 1 Encounters:  01/06/22 '5\' 9"'$  (1.753 m)    Weight:   Wt Readings from Last 1 Encounters:  01/06/22 64.4 kg   BMI:  Body mass index is 20.97 kg/m.  Estimated Nutritional Needs:   Kcal:  1700-1900  Protein:  75-90g  Fluid:  1.9L/day  Clayton Bibles, MS, RD, LDN Inpatient Clinical Dietitian Contact information available  via [employer]

## 2022-01-09 NOTE — Progress Notes (Signed)
Pharmacy Antibiotic Note  [name]. is a 76 y.o. male admitted on 01/06/2022 with R leg abscess. Previously treated with oral abx at facility for R leg cellulitis, and then again after developing abscess. I&D performed at [facility]. Pharmacy has been consulted for vancomycin dosing.   Plan: Continue vancomycin  1500 mg IV q24 hr (est AUC 513 based on SCr 0.8; Vd 0.72) Measure vancomycin AUC at steady state as indicated SCr q48 while on vanc.   Height: '5\' 9"'$  (175.3 cm) Weight: 64.4 kg (142 lb) IBW/kg (Calculated) : 70.7  Temp (24hrs), Avg:97.9 F (36.6 C), Min:97.6 F (36.4 C), Max:98.4 F (36.9 C)  Recent Labs  Lab 01/06/22 1326 01/06/22 1722 01/06/22 1954 01/07/22 0423 01/09/22 0416  WBC 11.4*  --   --  8.1 7.0  CREATININE 0.59*  --   --  0.38* 0.53*  LATICACIDVEN 1.9 2.4* 1.2  --   --      Estimated Creatinine Clearance: 71.6 mL/min (A) (by C-G formula based on SCr of 0.53 mg/dL (L)).    Allergies  Allergen Reactions   Nucynta [Tapentadol] Other (See Comments)    dizziness   Pregabalin     REACTION: dizziness and mental status changes   Exelon [Rivastigmine]     abd pain      5/21 BCx: per BCID 1/4 staph hominis  5/23 Abscess: few Staph aureus, few Strep pyogenes   Thank you for allowing pharmacy to be a part of this patient's care.   Royetta Asal, PharmD, BCPS 01/09/2022 12:15 PM

## 2022-01-09 NOTE — Consult Note (Signed)
[facility]    Date of Admission:  01/06/2022     Reason for Consult: staph aureus sq abscess    Referring Provider: Darrick Meigs     Abx: 5/21-c vancomycin        Assessment: 76 yo male parkinson disease, dementia, recent mechanical fall with right femoral head fracture 6 weeks pta and bedbound since, stage 2 sacral decub present on admission, sent in from ALF for a chronicly draining right calf abscess after a 5 day course abx for cellulitis.    5/23 IR aspirate right calf abscess -- few gpc's 5/21 wound pus culture -- strep pyogenes and staph aureus  Mri right tibfib showed no knee joint involvement. Ortho consulted no open I&D in setting of bedbound, poor nutrition, comorbidity. IR aspirate abscess on 5/23  Of note, recently on hospice for progressive dementia/parkinson's and falls with bedbound status after a right femoral neck fx several weeks ago (non-surgical expectant management).   Patient has been onhospice for dementia/parkinson/right hip fracture. But due to worsening cellulitis/abscess as admitted.   I discussed with his son for abscess without surgery abx alone might not work, and that group a strep if virulent enough and cause necrotizing fasciitis (not currently) would need surgery as well. At this time even with hospice reasonable to try oral antibiotics and see if we can treat the abscess/cellulitis.  If in 3-5 days no clear improvement would redicuss with his son what he would like to further do  Plan: Linezolid has multiple ddi with neuro meds, will continue vanc for now F/u susceptibility of the staph aureus. Will decide if doxy needed, or just cephalexin would be adequate Discussed with his son extensively  Discussed with primary team   I spent 75 minute reviewing data/chart, and coordinating care and >50% direct face to face time providing counseling/discussing diagnostics/treatment plan with  patient      ------------------------------------------------ Principal Problem:   Abscess Active Problems:   HYPOKALEMIA   Essential hypertension   CONSTIPATION, CHRONIC   Memory difficulties   Parkinson disease ([place])   Pressure injury of skin    HPI: [name]. is a 76 y.o. male parkinson, dementia, recent right fem neck fracture currently bed bound in ALF, on hospice, but referred for admission on 5/21 after right calf cellulitis failed oral tx and had developed into abscess  Patient had moved into current ALF 4 years prior for parkinson and dementia  He had been falling and about 6 weeks ago had aa 11/20/21 outpatient pelvis fracture that showed a mildly displaced right femoral neck fracture of indeterminate age. Since then he has been bedbound and had significant mental decline  Hospice has been involved since. He continues on his parkinson's medication  About 2 weeks ago he was given 5 day of oral abx for cellulitis of his right calf. However, the last several days noted to have increased swelling and purulent discharge so referred for admission  He still can eat and converse His 2 sons are very involved and understanding  On arrival has low grade fever but no leukocytosis/hemodynamic disturbance Mri tibfib no joint involvement outside of myositis/abscess Ortho involved but due to comorbidity no OR I&D IR drained yesterday Swab cx on admission staph aureus and gAs 5/23 ir drained cx gpc  He is on vancomycin  No complaint  Family History  Problem Relation Age of Onset   Colon cancer Mother    Dementia Mother  Parkinsonism Father 53   Lung cancer Paternal Grandfather        smoker   Liver disease Neg Hx    Kidney disease Neg Hx    Esophageal cancer Neg Hx     Social History   Tobacco Use   Smoking status: Never   Smokeless tobacco: Never  Vaping Use   Vaping Use: Never used  Substance Use Topics   Alcohol use: No    Alcohol/week: 0.0  standard drinks    Comment: less than 5 per week   Drug use: No    Allergies  Allergen Reactions   Nucynta [Tapentadol] Other (See Comments)    dizziness   Pregabalin     REACTION: dizziness and mental status changes   Exelon [Rivastigmine]     abd pain    Review of Systems: ROS All Other ROS was negative, except mentioned above   Past Medical History:  Diagnosis Date   Abdominal pain, right lower quadrant    Abnormality of gait 05/16/2016   Acute prostatitis    Anxiety    Constipation    Degenerative disc disease    Depression    Diverticulosis of colon (without mention of hemorrhage)    Essential and other specified forms of tremor    Family history of malignant neoplasm of gastrointestinal tract    Fibromyalgia    Ganglion and cyst of synovium, tendon, and bursa    GERD (gastroesophageal reflux disease)    Hemarthrosis, upper arm    Hyperlipidemia    Hypersomnia with sleep apnea, unspecified    Hypertension    Hypopotassemia    Localized osteoarthrosis not specified whether primary or secondary, lower leg    Lumbago    Memory difficulties 04/22/2013   Nocturia    Osteoporosis, unspecified    Other testicular hypofunction    Parkinson disease ([place]) 05/16/2016   Right inguinal hernia    Sleep apnea    last sleep study 11/11 on chart- Bipap with settings of 4 per  patient   Spinal stenosis, lumbar region, without neurogenic claudication    Syncope and collapse    Unspecified adverse effect of unspecified drug, medicinal and biological substance    Unspecified gastritis and gastroduodenitis without mention of hemorrhage    Urinary frequency        Scheduled Meds:  vitamin C  500 mg Oral Daily   carbidopa-levodopa  1 tablet Oral TID WC   citalopram  30 mg Oral Daily   enoxaparin (LOVENOX) injection  40 mg Subcutaneous Q24H   entacapone  200 mg Oral TID WC   feeding supplement  237 mL Oral BID BM   memantine  10 mg Oral BID   multivitamin with minerals  1  tablet Oral Daily   nutrition supplement (JUVEN)  1 packet Oral BID WC   potassium chloride  40 mEq Oral BID   zinc sulfate  220 mg Oral Daily   Continuous Infusions:  vancomycin 1,500 mg (01/08/22 2148)   PRN Meds:.oxyCODONE   OBJECTIVE: Blood pressure 127/80, pulse (!) 109, temperature 97.8 F (36.6 C), resp. rate 16, height '5\' 9"'$  (1.753 m), weight 64.4 kg, SpO2 100 %.  Physical Exam  General/constitutional: chronically ill appearing, no distress, some conversation HEENT: Normocephalic, PER, Conj Clear, EOMI, Oropharynx clear; mucosa dry Neck supple CV: rrr no mrg Lungs: clear to auscultation, normal respiratory effort Abd: Soft, Nontender Ext: no edema Skin: right calf purulent drainage no erythema; slight induration; no tenderness Neuro: rigid, bedbound some  upper ext movement minimally MSK: no peripheral joint swelling/tenderness/warmth  Lab Results Lab Results  Component Value Date   WBC 7.0 01/09/2022   HGB 10.8 (L) 01/09/2022   HCT 32.0 (L) 01/09/2022   MCV 92.2 01/09/2022   PLT 278 01/09/2022    Lab Results  Component Value Date   CREATININE 0.53 (L) 01/09/2022   BUN 8 01/09/2022   NA 139 01/09/2022   K 2.5 (LL) 01/09/2022   CL 101 01/09/2022   CO2 28 01/09/2022    Lab Results  Component Value Date   ALT 7 01/09/2022   AST 15 01/09/2022   ALKPHOS 128 (H) 01/09/2022   BILITOT 0.9 01/09/2022      Microbiology: Recent Results (from the past 240 hour(s))  Blood culture (routine x 2)     Status: None (Preliminary result)   Collection Time: 01/06/22  1:17 PM   Specimen: BLOOD  Result Value Ref Range Status   Specimen Description   Final    BLOOD RIGHT ANTECUBITAL Blood Culture adequate volume BOTTLES DRAWN AEROBIC AND ANAEROBIC Performed at [facility], [place] [address], [place], [place] [address]    Special Requests   Final    NONE Performed at [facility], [place] [address], [place], [place] [address]     Culture   Final    NO GROWTH 3 DAYS Performed at [facility], [place] [address] [place], [place] [address]    Report Status PENDING  Incomplete  Blood culture (routine x 2)     Status: Abnormal   Collection Time: 01/06/22  4:00 PM   Specimen: BLOOD  Result Value Ref Range Status   Specimen Description   Final    BLOOD BLOOD LEFT ARM Performed at [facility] [address], [place], [place] [address]    Special Requests   Final    BOTTLES DRAWN AEROBIC AND ANAEROBIC Blood Culture adequate volume Performed at [facility] [address], [place], [place] [address]    Culture  Setup Time   Final    GRAM POSITIVE COCCI IN CLUSTERS IN BOTH AEROBIC AND ANAEROBIC BOTTLES CRITICAL RESULT CALLED TO, READ BACK BY AND VERIFIED WITH: PHARMD JUSTIN LEGGE ON 01/07/22 @ 1606 BY DRT    Culture (A)  Final    STAPHYLOCOCCUS HOMINIS THE SIGNIFICANCE OF ISOLATING THIS ORGANISM FROM A SINGLE SET OF BLOOD CULTURES WHEN MULTIPLE SETS ARE DRAWN IS UNCERTAIN. PLEASE NOTIFY THE MICROBIOLOGY DEPARTMENT WITHIN ONE WEEK IF SPECIATION AND SENSITIVITIES ARE REQUIRED. Performed at [facility], [place] [address], [place],  [address]    Report Status 01/09/2022 FINAL  Final  Blood Culture ID Panel (Reflexed)     Status: Abnormal   Collection Time: 01/06/22  4:00 PM  Result Value Ref Range Status   Enterococcus faecalis NOT DETECTED NOT DETECTED Final   Enterococcus Faecium NOT DETECTED NOT DETECTED Final   Listeria monocytogenes NOT DETECTED NOT DETECTED Final   Staphylococcus species DETECTED (A) NOT DETECTED Final    Comment: CRITICAL RESULT CALLED TO, READ BACK BY AND VERIFIED WITH: PHARMD JUSTIN LEGGE ON 01/07/22 @ 1606 BY DRT    Staphylococcus aureus (BCID) NOT DETECTED NOT DETECTED Final   Staphylococcus epidermidis NOT DETECTED NOT DETECTED Final   Staphylococcus lugdunensis NOT DETECTED NOT DETECTED Final   Streptococcus species NOT DETECTED NOT  DETECTED Final   Streptococcus agalactiae NOT DETECTED NOT DETECTED Final   Streptococcus pneumoniae NOT DETECTED NOT DETECTED Final  Streptococcus pyogenes NOT DETECTED NOT DETECTED Final   A.calcoaceticus-baumannii NOT DETECTED NOT DETECTED Final   Bacteroides fragilis NOT DETECTED NOT DETECTED Final   Enterobacterales NOT DETECTED NOT DETECTED Final   Enterobacter cloacae complex NOT DETECTED NOT DETECTED Final   Escherichia coli NOT DETECTED NOT DETECTED Final   Klebsiella aerogenes NOT DETECTED NOT DETECTED Final   Klebsiella oxytoca NOT DETECTED NOT DETECTED Final   Klebsiella pneumoniae NOT DETECTED NOT DETECTED Final   Proteus species NOT DETECTED NOT DETECTED Final   Salmonella species NOT DETECTED NOT DETECTED Final   Serratia marcescens NOT DETECTED NOT DETECTED Final   Haemophilus influenzae NOT DETECTED NOT DETECTED Final   Neisseria meningitidis NOT DETECTED NOT DETECTED Final   Pseudomonas aeruginosa NOT DETECTED NOT DETECTED Final   Stenotrophomonas maltophilia NOT DETECTED NOT DETECTED Final   Candida albicans NOT DETECTED NOT DETECTED Final   Candida auris NOT DETECTED NOT DETECTED Final   Candida glabrata NOT DETECTED NOT DETECTED Final   Candida krusei NOT DETECTED NOT DETECTED Final   Candida parapsilosis NOT DETECTED NOT DETECTED Final   Candida tropicalis NOT DETECTED NOT DETECTED Final   Cryptococcus neoformans/gattii NOT DETECTED NOT DETECTED Final    Comment: Performed at [facility], [address], [place], [place] [address]  Aerobic/Anaerobic Culture w Gram Stain (surgical/deep wound)     Status: None (Preliminary result)   Collection Time: 01/06/22  4:18 PM   Specimen: Abscess  Result Value Ref Range Status   Specimen Description   Final    ABSCESS Performed at [place] [place] [address], [place], [place] [address]    Special Requests   Final    NONE Performed at [facility], [place] [address]  [place], [place] [address]    Gram Stain   Final    NO SQUAMOUS EPITHELIAL CELLS SEEN NO WBC SEEN FEW GRAM POSITIVE COCCI    Culture   Final    FEW STAPHYLOCOCCUS AUREUS FEW STREPTOCOCCUS PYOGENES Beta hemolytic streptococci are predictably susceptible to penicillin and other beta lactams. Susceptibility testing not routinely performed. CULTURE REINCUBATED FOR BETTER GROWTH Performed at [facility], [place] [address], [place], [place] [address]    Report Status PENDING  Incomplete  Aerobic/Anaerobic Culture w Gram Stain (surgical/deep wound)     Status: None (Preliminary result)   Collection Time: 01/08/22  4:30 PM   Specimen: Abscess  Result Value Ref Range Status   Specimen Description   Final    ABSCESS Performed at [place] [address], [place], [place] [address]    Special Requests   Final    RIGHT LEG Performed at [place] [address], [place], [place] [address]    Gram Stain   Final    ABUNDANT WBC PRESENT, PREDOMINANTLY PMN FEW GRAM POSITIVE COCCI    Culture   Final    NO GROWTH < 12 HOURS Performed at [facility] [address], [place], [place] [address]    Report Status PENDING  Incomplete     Serology:    Imaging: If present, new imagings (plain films, ct scans, and mri) have been personally visualized and interpreted; radiology reports have been reviewed. Decision making incorporated into the Impression / Recommendations.  5/22 mri right tibfib Subcutaneous abscess along the proximal posterior calf measuring 3.5 x 0.9 x 5.4 cm.   Additional probable abscess measuring 1.4 x 0.7 x 5.5 cm at the superficial myofascial  interface of the medial soleus distally.   Diffuse intramuscular edema with streaky atrophy and more confluent fatty atrophy of the mediolateral gastrocnemius muscles. This could represent myositis and/or denervation change.   No evidence of osteomyelitis.  Jabier Mutton,  [facility] [place] [phone] pager    01/09/2022, 1:43 PM

## 2022-01-10 DIAGNOSIS — E440 Moderate protein-calorie malnutrition: Secondary | ICD-10-CM | POA: Insufficient documentation

## 2022-01-10 DIAGNOSIS — G20 Parkinson's disease: Secondary | ICD-10-CM | POA: Diagnosis not present

## 2022-01-10 DIAGNOSIS — L0291 Cutaneous abscess, unspecified: Secondary | ICD-10-CM | POA: Diagnosis not present

## 2022-01-10 DIAGNOSIS — I10 Essential (primary) hypertension: Secondary | ICD-10-CM | POA: Diagnosis not present

## 2022-01-10 DIAGNOSIS — E876 Hypokalemia: Secondary | ICD-10-CM | POA: Diagnosis not present

## 2022-01-10 LAB — BASIC METABOLIC PANEL
Anion gap: 7 (ref 5–15)
BUN: 10 mg/dL (ref 8–23)
CO2: 28 mmol/L (ref 22–32)
Calcium: 8.3 mg/dL — ABNORMAL LOW (ref 8.9–10.3)
Chloride: 103 mmol/L (ref 98–111)
Creatinine, Ser: 0.54 mg/dL — ABNORMAL LOW (ref 0.61–1.24)
GFR, Estimated: >60 mL/min (ref >60)
Glucose, Bld: 98 mg/dL (ref 70–99)
Potassium: 3.3 mmol/L — ABNORMAL LOW (ref 3.5–5.1)
Sodium: 138 mmol/L (ref 135–145)

## 2022-01-10 MED ORDER — POTASSIUM CHLORIDE CRYS ER 20 MEQ PO TBCR
40.0000 meq | EXTENDED_RELEASE_TABLET | Freq: Once | ORAL | Status: AC
  Administered 2022-01-10: 40 meq via ORAL
  Filled 2022-01-10: qty 2

## 2022-01-10 NOTE — Progress Notes (Signed)
[facility]  PROGRESS NOTE  [name]. EUM:[id number] DOB: [birth year] DOA: 01/06/2022 PCP: Eulas Post, MD   Brief HPI:    76 year old male with medical history of Parkinson disease, dementia was sent from assisted living facility concerning abscess on right posterior aspect of right leg and heel.  This was drained by hospice nurse at the facility and by the ED provider in the ED checked on it and was still draining pus.  Patient had a fall prior to admission and had femoral neck fracture about 6 weeks ago and has been bedbound since then.  Since patient was gradually declining since hip fracture, hospice consult was placed last month.  Patient is currently followed by [facility].  Patient recently had lower extremity cellulitis which was treated with oral antibiotics.  Later he developed abscess and completed 5 days course of antibiotics.  His appointment was stopped after enrolling in hospice.   Subjective   Patient seen and examined, no new complaints.   Assessment/Plan:    Right calf abscess -Failed outpatient treatment with antibiotics and incision and drainage -Blood culture ID 1/4 staph species -Orthopedics and podiatry were consulted MRI of the right knee and right tibia -subcutaneous abscess along the posterior proximal calf measuring 3.5 x 0.9 x 5.4 cm.  Additional probable abscess measuring 1.4 x 0.7 x 5.5 cm at the superficial myofascial interface of the medial soleus distally.  Diffuse intramuscular edema with streaky atrophy and more confluent fatty atrophy of the medial lateral gastrinomas muscle may represent myositis and or denervation change. -IR consulted 01/08/2022 and patient underwent incision and drainage of right calf abscess  Patient started on vancomycin -ID consulted, blood culture growing 1/2 sets of Staph hominis, likely contamination -Patient on IV Vanc -Plan to change to oral antibiotics for tomorrow for 2 to 3-week course of deep  soft tissue abscess/myositis   Parkinson disease with dementia -Stable -Continue home medications  Hypokalemia -Potassium is 3.3 this morning -Replace potassium and follow BMP in am  Stage II sacral pressure ulcer injury with right heel eschar/right ankle eschar POA -Wound care consulted  Goals of care -Patient is DNR/DNI -Patient was on hospice prior to admission     Medications     vitamin C  500 mg Oral Daily   carbidopa-levodopa  1 tablet Oral TID WC   citalopram  30 mg Oral Daily   enoxaparin (LOVENOX) injection  40 mg Subcutaneous Q24H   entacapone  200 mg Oral TID WC   feeding supplement  237 mL Oral BID BM   memantine  10 mg Oral BID   multivitamin with minerals  1 tablet Oral Daily   nutrition supplement (JUVEN)  1 packet Oral BID WC   zinc sulfate  220 mg Oral Daily     Data Reviewed:   CBG:  No results for input(s): GLUCAP in the last 168 hours.  SpO2: 100 %    Vitals:   01/09/22 0433 01/09/22 1318 01/09/22 2017 01/10/22 0524  BP: (!) 153/62 127/80 (!) 153/66 (!) 172/76  Pulse: (!) 54 (!) 109 (!) 58 (!) 54  Resp: '16 16 16 12  '$ Temp: 97.6 F (36.4 C) 97.8 F (36.6 C) 97.7 F (36.5 C) 97.8 F (36.6 C)  TempSrc:   Oral Oral  SpO2: 100% 100% 99% 100%  Weight:      Height:          Data Reviewed:  Basic Metabolic Panel: Recent Labs  Lab 01/06/22 1326 01/07/22  0423 01/09/22 0416 01/10/22 0422  NA 143 143 139 138  K 2.8* 3.8 2.5* 3.3*  CL 102 106 101 103  CO2 34* '30 28 28  '$ GLUCOSE 144* 96 100* 98  BUN '15 15 8 10  '$ CREATININE 0.59* 0.38* 0.53* 0.54*  CALCIUM 9.0 8.5* 8.3* 8.3*  MG 2.0  --   --   --     CBC: Recent Labs  Lab 01/06/22 1326 01/07/22 0423 01/09/22 0416  WBC 11.4* 8.1 7.0  NEUTROABS 10.1*  --   --   HGB 11.1* 8.9* 10.8*  HCT 32.3* 26.7* 32.0*  MCV 93.1 96.0 92.2  PLT 362 238 278    LFT Recent Labs  Lab 01/06/22 1326 01/07/22 0423 01/09/22 0416  AST 14* 10* 15  ALT '10 11 7  '$ ALKPHOS 135* 109 128*   BILITOT 0.6 0.6 0.9  PROT 6.8 5.5* 6.5  ALBUMIN 2.5* 2.4* 2.5*     Antibiotics: Anti-infectives (From admission, onward)    Start     Dose/Rate Route Frequency Ordered Stop   01/06/22 2200  vancomycin (VANCOREADY) IVPB 1500 mg/300 mL        1,500 mg 150 mL/hr over 120 Minutes Intravenous Every 24 hours 01/06/22 1728     01/06/22 1615  vancomycin (VANCOCIN) IVPB 1000 mg/200 mL premix  Status:  Discontinued        1,000 mg 200 mL/hr over 60 Minutes Intravenous  Once 01/06/22 1611 01/06/22 1613   01/06/22 1600  vancomycin (VANCOCIN) IVPB 1000 mg/200 mL premix        1,000 mg 200 mL/hr over 60 Minutes Intravenous  Once 01/06/22 1557 01/06/22 1709        DVT prophylaxis: Lovenox  Code Status: DNR  Family Communication: No family at bedside   CONSULTS IR, orthopedics   Objective    Physical Examination:   General-appears in no acute distress Heart-S1-S2, regular, no murmur auscultated Lungs-clear to auscultation bilaterally, no wheezing or crackles auscultated Abdomen-soft, nontender, no organomegaly Extremities-right lower extremity in dressing  Neuro- alert, oriented x 3, no focal deficit noted  Status is: Inpatient: Right calf abscess    Pressure Injury 01/06/22 Sacrum Medial Stage 2 -  Partial thickness loss of dermis presenting as a shallow open injury with a red, pink wound bed without slough. redness w/part of epidermis missing (Active)  01/06/22 1800  Location: Sacrum  Location Orientation: Medial  Staging: Stage 2 -  Partial thickness loss of dermis presenting as a shallow open injury with a red, pink wound bed without slough.  Wound Description (Comments): redness w/part of epidermis missing  Present on Admission: Yes     Pressure Injury 01/06/22 Heel Right Eschar sloughing with purulent drainage at opening. (Active)  01/06/22 1815  Location: Heel  Location Orientation: Right  Staging:   Wound Description (Comments): Eschar sloughing with purulent  drainage at opening.  Present on Admission: Yes     Pressure Injury 01/06/22 Ankle Right;Lateral Eschar intact to malleolous area w/ approx. 1cm of red induration..Eschar surrounded by a halo of white tissue. (Active)  01/06/22   Location: Ankle  Location Orientation: Right;Lateral  Staging:   Wound Description (Comments): Eschar intact to malleolous area w/ approx. 1cm of red induration..Eschar surrounded by a halo of white tissue.  Present on Admission: Yes        [place]   [facility] If 7PM-7AM, please contact night-coverage at [email or web address], Office  [phone]   01/10/2022, 11:53 AM  LOS: 3 days

## 2022-01-10 NOTE — Care Management Important Message (Signed)
Important Message  Patient Details IM Letter placed in Patients room. Name: [name]. MRN: [id number] Date of Birth: 10/09/1945   Medicare Important Message Given:  Yes     Kerin Salen 01/10/2022, 11:04 AM

## 2022-01-10 NOTE — Progress Notes (Signed)
Id brief note   Bcx 1 of 2 set staph hominis likely contaminant Abscess culture susceptibility still cooking   -continue iv vanc for today -tomorrow can change to oral abx for anticipated 2-3 week course for deep soft tissue abscess/myositis

## 2022-01-11 DIAGNOSIS — L0291 Cutaneous abscess, unspecified: Secondary | ICD-10-CM | POA: Diagnosis not present

## 2022-01-11 DIAGNOSIS — F03B0 Unspecified dementia, moderate, without behavioral disturbance, psychotic disturbance, mood disturbance, and anxiety: Secondary | ICD-10-CM | POA: Diagnosis not present

## 2022-01-11 DIAGNOSIS — L89613 Pressure ulcer of right heel, stage 3: Secondary | ICD-10-CM

## 2022-01-11 DIAGNOSIS — G20 Parkinson's disease: Secondary | ICD-10-CM | POA: Diagnosis not present

## 2022-01-11 LAB — BASIC METABOLIC PANEL
Anion gap: 5 (ref 5–15)
BUN: 9 mg/dL (ref 8–23)
CO2: 30 mmol/L (ref 22–32)
Calcium: 8.4 mg/dL — ABNORMAL LOW (ref 8.9–10.3)
Chloride: 101 mmol/L (ref 98–111)
Creatinine, Ser: 0.56 mg/dL — ABNORMAL LOW (ref 0.61–1.24)
GFR, Estimated: >60 mL/min (ref >60)
Glucose, Bld: 92 mg/dL (ref 70–99)
Potassium: 2.7 mmol/L — CL (ref 3.5–5.1)
Sodium: 136 mmol/L (ref 135–145)

## 2022-01-11 LAB — CULTURE, BLOOD (ROUTINE X 2): Culture: NO GROWTH

## 2022-01-11 LAB — MAGNESIUM: Magnesium: 1.7 mg/dL (ref 1.7–2.4)

## 2022-01-11 MED ORDER — POTASSIUM CHLORIDE 20 MEQ PO PACK
60.0000 meq | PACK | Freq: Once | ORAL | Status: AC
  Administered 2022-01-11: 60 meq via ORAL
  Filled 2022-01-11: qty 3

## 2022-01-11 MED ORDER — POTASSIUM CHLORIDE 10 MEQ/100ML IV SOLN
10.0000 meq | INTRAVENOUS | Status: AC
  Administered 2022-01-11 (×5): 10 meq via INTRAVENOUS
  Filled 2022-01-11: qty 100

## 2022-01-11 MED ORDER — MAGNESIUM SULFATE 2 GM/50ML IV SOLN
2.0000 g | Freq: Once | INTRAVENOUS | Status: AC
  Administered 2022-01-11: 2 g via INTRAVENOUS
  Filled 2022-01-11: qty 50

## 2022-01-11 MED ORDER — CEFADROXIL 500 MG PO CAPS
1000.0000 mg | ORAL_CAPSULE | Freq: Two times a day (BID) | ORAL | Status: DC
  Administered 2022-01-11 – 2022-01-12 (×2): 1000 mg via ORAL
  Filled 2022-01-11 (×2): qty 2

## 2022-01-11 NOTE — Progress Notes (Signed)
[facility]  PROGRESS NOTE  [name]. OZH:[id number] DOB: 1946/04/09 DOA: 01/06/2022 PCP: Eulas [place], MD   Brief HPI:    76 year old male with medical history of Parkinson disease, dementia was sent from assisted living facility concerning abscess on right posterior aspect of right leg and heel.  This was drained by hospice nurse at the facility and by the ED provider in the ED checked on it and was still draining pus.  Patient had a fall prior to admission and had femoral neck fracture about 6 weeks ago and has been bedbound since then.  Since patient was gradually declining since hip fracture, hospice consult was placed last month.  Patient is currently followed by [facility].  Patient recently had lower extremity cellulitis which was treated with oral antibiotics.  Later he developed abscess and completed 5 days course of antibiotics.  His appointment was stopped after enrolling in hospice.   Subjective   Patient seen and examined, denies any complaints.   Assessment/Plan:    Right calf abscess -Failed outpatient treatment with antibiotics and incision and drainage -Blood culture ID 1/4 staph species -Orthopedics and podiatry were consulted MRI of the right knee and right tibia -subcutaneous abscess along the posterior proximal calf measuring 3.5 x 0.9 x 5.4 cm.  Additional probable abscess measuring 1.4 x 0.7 x 5.5 cm at the superficial myofascial interface of the medial soleus distally.  Diffuse intramuscular edema with streaky atrophy and more confluent fatty atrophy of the medial lateral gastrinomas muscle may represent myositis and or denervation change. -IR consulted 01/08/2022 and patient underwent incision and drainage of right calf abscess  Patient started on vancomycin -ID consulted, blood culture growing 1/2 sets of Staph hominis, likely contamination -Patient was started on IV Vanc -ID has changed antibiotics from vancomycin to cefadroxil for 3  weeks -And if patient does not get better by that time and son wants surgery at that time consider surgical evaluation for deep soft tissue abscess/myositis   Parkinson disease with dementia -Stable -Continue home medications  Hypokalemia -Potassium is 2.7 this morning -Patient has not been taking p.o. potassium -Magnesium 1.7 this morning which was replaced -We will order IV KCl 10 meq x 5 doses -Follow BMP in am   Stage II sacral pressure ulcer injury with right heel eschar/right ankle eschar POA -Wound care consulted  Goals of care -Patient is DNR/DNI -Patient was on hospice prior to admission     Medications     vitamin C  500 mg Oral Daily   carbidopa-levodopa  1 tablet Oral TID WC   cefadroxil  1,000 mg Oral BID   citalopram  30 mg Oral Daily   enoxaparin (LOVENOX) injection  40 mg Subcutaneous Q24H   entacapone  200 mg Oral TID WC   feeding supplement  237 mL Oral BID BM   memantine  10 mg Oral BID   multivitamin with minerals  1 tablet Oral Daily   nutrition supplement (JUVEN)  1 packet Oral BID WC   zinc sulfate  220 mg Oral Daily     Data Reviewed:   CBG:  No results for input(s): GLUCAP in the last 168 hours.  SpO2: 93 %    Vitals:   01/10/22 0524 01/10/22 1358 01/10/22 2100 01/11/22 0618  BP: (!) 172/76 (!) 147/71 (!) 165/66 (!) 174/79  Pulse: (!) 54 (!) 52 (!) 52 71  Resp: '12 16 16 16  '$ Temp: 97.8 F (36.6 C) (!) 97.5 F (36.4  C) 97.8 F (36.6 C) 98.5 F (36.9 C)  TempSrc: Oral Oral Axillary Oral  SpO2: 100% 99% 100% 93%  Weight:      Height:          Data Reviewed:  Basic Metabolic Panel: Recent Labs  Lab 01/06/22 1326 01/07/22 0423 01/09/22 0416 01/10/22 0422 01/11/22 0457  NA 143 143 139 138 136  K 2.8* 3.8 2.5* 3.3* 2.7*  CL 102 106 101 103 101  CO2 34* '30 28 28 30  '$ GLUCOSE 144* 96 100* 98 92  BUN '15 15 8 10 9  '$ CREATININE 0.59* 0.38* 0.53* 0.54* 0.56*  CALCIUM 9.0 8.5* 8.3* 8.3* 8.4*  MG 2.0  --   --   --  1.7     CBC: Recent Labs  Lab 01/06/22 1326 01/07/22 0423 01/09/22 0416  WBC 11.4* 8.1 7.0  NEUTROABS 10.1*  --   --   HGB 11.1* 8.9* 10.8*  HCT 32.3* 26.7* 32.0*  MCV 93.1 96.0 92.2  PLT 362 238 278    LFT Recent Labs  Lab 01/06/22 1326 01/07/22 0423 01/09/22 0416  AST 14* 10* 15  ALT '10 11 7  '$ ALKPHOS 135* 109 128*  BILITOT 0.6 0.6 0.9  PROT 6.8 5.5* 6.5  ALBUMIN 2.5* 2.4* 2.5*     Antibiotics: Anti-infectives (From admission, onward)    Start     Dose/Rate Route Frequency Ordered Stop   01/11/22 2200  cefadroxil (DURICEF) capsule 1,000 mg        1,000 mg Oral 2 times daily 01/11/22 1054 02/02/22 0959   01/06/22 2200  vancomycin (VANCOREADY) IVPB 1500 mg/300 mL  Status:  Discontinued        1,500 mg 150 mL/hr over 120 Minutes Intravenous Every 24 hours 01/06/22 1728 01/11/22 1054   01/06/22 1615  vancomycin (VANCOCIN) IVPB 1000 mg/200 mL premix  Status:  Discontinued        1,000 mg 200 mL/hr over 60 Minutes Intravenous  Once 01/06/22 1611 01/06/22 1613   01/06/22 1600  vancomycin (VANCOCIN) IVPB 1000 mg/200 mL premix        1,000 mg 200 mL/hr over 60 Minutes Intravenous  Once 01/06/22 1557 01/06/22 1709        DVT prophylaxis: Lovenox  Code Status: DNR  Family Communication: No family at bedside   CONSULTS IR, orthopedics   Objective    Physical Examination:   General-appears in no acute distress Heart-S1-S2, regular, no murmur auscultated Lungs-clear to auscultation bilaterally, no wheezing or crackles auscultated Abdomen-soft, nontender, no organomegaly Extremities-right lower extremity in dressing  Neuro-alert, oriented x3, no focal deficit noted  Status is: Inpatient: Right calf abscess    Pressure Injury 01/06/22 Sacrum Medial Stage 2 -  Partial thickness loss of dermis presenting as a shallow open injury with a red, pink wound bed without slough. redness w/part of epidermis missing (Active)  01/06/22 1800  Location: Sacrum  Location  Orientation: Medial  Staging: Stage 2 -  Partial thickness loss of dermis presenting as a shallow open injury with a red, pink wound bed without slough.  Wound Description (Comments): redness w/part of epidermis missing  Present on Admission: Yes     Pressure Injury 01/06/22 Heel Right Eschar sloughing with purulent drainage at opening. (Active)  01/06/22 1815  Location: Heel  Location Orientation: Right  Staging:   Wound Description (Comments): Eschar sloughing with purulent drainage at opening.  Present on Admission: Yes     Pressure Injury 01/06/22 Ankle Right;Lateral Eschar intact to  malleolous area w/ approx. 1cm of red induration..Eschar surrounded by a halo of white tissue. (Active)  01/06/22   Location: Ankle  Location Orientation: Right;Lateral  Staging:   Wound Description (Comments): Eschar intact to malleolous area w/ approx. 1cm of red induration..Eschar surrounded by a halo of white tissue.  Present on Admission: Yes        [place]   [facility] If 7PM-7AM, please contact night-coverage at [email or web address], Office  [phone]   01/11/2022, 11:30 AM  LOS: 4 days

## 2022-01-11 NOTE — Progress Notes (Signed)
[facility] for Infectious [facility]  Date of Admission:  01/06/2022     Abx: 5/21-c vancomycin                                                         Assessment: 76 yo male parkinson disease, dementia, recent mechanical fall with right femoral head fracture 6 weeks pta and bedbound since, stage 2 sacral decub present on admission, sent in from ALF for a chronicly draining right calf abscess after a 5 day course abx for cellulitis.     5/23 IR aspirate right calf abscess -- few gpc's 5/21 wound pus culture -- strep pyogenes and staph aureus   Mri right tibfib showed no knee joint involvement. Ortho consulted no open I&D in setting of bedbound, poor nutrition, comorbidity. IR aspirate abscess on 5/23   Of note, recently on hospice for progressive dementia/parkinson's and falls with bedbound status after a right femoral neck fx several weeks ago (non-surgical expectant management).    Patient has been onhospice for dementia/parkinson/right hip fracture. But due to worsening cellulitis/abscess as admitted.    I discussed with his son for abscess without surgery abx alone might not work, and that group a strep if virulent enough and cause necrotizing fasciitis (not currently) would need surgery as well. At this time even with hospice reasonable to try oral antibiotics and see if we can treat the abscess/cellulitis.   If in 3-5 days no clear improvement would redicuss with his son what he would like to further do  ---------------- 01/11/2022 assessment Cx mssa and GAS  Clinically there is still purulent drainage from the right calf abscess; the selling does appear to be better; there is only a small opening where the abscess had formed a tract  We can continue to try long course of antibiotics and see if it helps, but again not sure if we'll achieve cure alone with abx  I do not feel treating this improves his quality of life however  He continues to demonstrate 100%  dependence on ADL and being bed bound. His electrolytes have been significantly low. Will defer to primary team and his family regarding hospice/goals of care   Plan: Would discharge on 3 weeks cefadroxil for the mssa If no change by end of this abx course, if family wants definitive care would discuss with surgery.  Will sign off Discussed with primary team  I spent more than 35 minute reviewing data/chart, and coordinating care and >50% direct face to face time providing counseling/discussing diagnostics/treatment plan with patient   Principal Problem:   Abscess Active Problems:   HYPOKALEMIA   Essential hypertension   CONSTIPATION, CHRONIC   Memory difficulties   Parkinson disease ([place])   Pressure injury of skin   S/P right hip fracture   Malnutrition of moderate degree   Allergies  Allergen Reactions   Nucynta [Tapentadol] Other (See Comments)    dizziness   Pregabalin     REACTION: dizziness and mental status changes   Exelon [Rivastigmine]     abd pain    Scheduled Meds:  vitamin C  500 mg Oral Daily   carbidopa-levodopa  1 tablet Oral TID WC   cefadroxil  1,000 mg Oral BID   citalopram  30 mg Oral Daily  enoxaparin (LOVENOX) injection  40 mg Subcutaneous Q24H   entacapone  200 mg Oral TID WC   feeding supplement  237 mL Oral BID BM   memantine  10 mg Oral BID   multivitamin with minerals  1 tablet Oral Daily   nutrition supplement (JUVEN)  1 packet Oral BID WC   zinc sulfate  220 mg Oral Daily   Continuous Infusions:  potassium chloride     PRN Meds:.oxyCODONE   SUBJECTIVE: No complaint No concern from nursing standpoing afebrile  Review of Systems: ROS All other ROS was negative, except mentioned above. Limited as patient is not able to converse and has dementia     OBJECTIVE: Vitals:   01/10/22 0524 01/10/22 1358 01/10/22 2100 01/11/22 0618  BP: (!) 172/76 (!) 147/71 (!) 165/66 (!) 174/79  Pulse: (!) 54 (!) 52 (!) 52 71  Resp: '12 16 16  16  '$ Temp: 97.8 F (36.6 C) (!) 97.5 F (36.4 C) 97.8 F (36.6 C) 98.5 F (36.9 C)  TempSrc: Oral Oral Axillary Oral  SpO2: 100% 99% 100% 93%  Weight:      Height:       Body mass index is 20.97 kg/m.  Physical Exam General/constitutional: bedbound; chronically ill appearing HEENT: Normocephalic, PER, Conj Clear CV: rrr no mrg Lungs: clear to auscultation, normal respiratory effort Abd: Soft, Nontender Ext: no edema Skin: the right calf swelling resolved; there is no tenderness; persistent purulence expressed coming through the sinus tract. There is ulcer at the right heel posterolaterally with eschar and no surrounding cellulitis or fluctuance. Dressing changed Neuro: rigid, unable to assist with exam, unable to answer complex question; weak phonation   Lab Results Lab Results  Component Value Date   WBC 7.0 01/09/2022   HGB 10.8 (L) 01/09/2022   HCT 32.0 (L) 01/09/2022   MCV 92.2 01/09/2022   PLT 278 01/09/2022    Lab Results  Component Value Date   CREATININE 0.56 (L) 01/11/2022   BUN 9 01/11/2022   NA 136 01/11/2022   K 2.7 (LL) 01/11/2022   CL 101 01/11/2022   CO2 30 01/11/2022    Lab Results  Component Value Date   ALT 7 01/09/2022   AST 15 01/09/2022   ALKPHOS 128 (H) 01/09/2022   BILITOT 0.9 01/09/2022      Microbiology: Recent Results (from the past 240 hour(s))  Blood culture (routine x 2)     Status: None   Collection Time: 01/06/22  1:17 PM   Specimen: BLOOD  Result Value Ref Range Status   Specimen Description   Final    BLOOD RIGHT ANTECUBITAL Blood Culture adequate volume BOTTLES DRAWN AEROBIC AND ANAEROBIC Performed at [facility], [place] [address], [place], [place] [address]    Special Requests   Final    NONE Performed at [facility], [place] [address], [place], [place] [address]    Culture   Final    NO GROWTH 5 DAYS Performed at [facility], [place] [address] [place], [place] [address]     Report Status 01/11/2022 FINAL  Final  Blood culture (routine x 2)     Status: Abnormal   Collection Time: 01/06/22  4:00 PM   Specimen: BLOOD  Result Value Ref Range Status   Specimen Description   Final    BLOOD BLOOD LEFT ARM Performed at [place] [address], [place], [place] [address]    Special Requests   Final  BOTTLES DRAWN AEROBIC AND ANAEROBIC Blood Culture adequate volume Performed at [facility] [address] [place], [place] [address]    Culture  Setup Time   Final    GRAM POSITIVE COCCI IN CLUSTERS IN BOTH AEROBIC AND ANAEROBIC BOTTLES CRITICAL RESULT CALLED TO, READ BACK BY AND VERIFIED WITH: PHARMD JUSTIN LEGGE ON 01/07/22 @ 1606 BY DRT    Culture (A)  Final    STAPHYLOCOCCUS HOMINIS THE SIGNIFICANCE OF ISOLATING THIS ORGANISM FROM A SINGLE SET OF BLOOD CULTURES WHEN MULTIPLE SETS ARE DRAWN IS UNCERTAIN. PLEASE NOTIFY THE MICROBIOLOGY DEPARTMENT WITHIN ONE WEEK IF SPECIATION AND SENSITIVITIES ARE REQUIRED. Performed at [facility], [place] [address], [place], [place] [address]    Report Status 01/09/2022 FINAL  Final  Blood Culture ID Panel (Reflexed)     Status: Abnormal   Collection Time: 01/06/22  4:00 PM  Result Value Ref Range Status   Enterococcus faecalis NOT DETECTED NOT DETECTED Final   Enterococcus Faecium NOT DETECTED NOT DETECTED Final   Listeria monocytogenes NOT DETECTED NOT DETECTED Final   Staphylococcus species DETECTED (A) NOT DETECTED Final    Comment: CRITICAL RESULT CALLED TO, READ BACK BY AND VERIFIED WITH: PHARMD JUSTIN LEGGE ON 01/07/22 @ 1606 BY DRT    Staphylococcus aureus (BCID) NOT DETECTED NOT DETECTED Final   Staphylococcus epidermidis NOT DETECTED NOT DETECTED Final   Staphylococcus lugdunensis NOT DETECTED NOT DETECTED Final   Streptococcus species NOT DETECTED NOT DETECTED Final   Streptococcus agalactiae NOT DETECTED NOT DETECTED Final   Streptococcus pneumoniae NOT DETECTED  NOT DETECTED Final   Streptococcus pyogenes NOT DETECTED NOT DETECTED Final   A.calcoaceticus-baumannii NOT DETECTED NOT DETECTED Final   Bacteroides fragilis NOT DETECTED NOT DETECTED Final   Enterobacterales NOT DETECTED NOT DETECTED Final   Enterobacter cloacae complex NOT DETECTED NOT DETECTED Final   Escherichia coli NOT DETECTED NOT DETECTED Final   Klebsiella aerogenes NOT DETECTED NOT DETECTED Final   Klebsiella oxytoca NOT DETECTED NOT DETECTED Final   Klebsiella pneumoniae NOT DETECTED NOT DETECTED Final   Proteus species NOT DETECTED NOT DETECTED Final   Salmonella species NOT DETECTED NOT DETECTED Final   Serratia marcescens NOT DETECTED NOT DETECTED Final   Haemophilus influenzae NOT DETECTED NOT DETECTED Final   Neisseria meningitidis NOT DETECTED NOT DETECTED Final   Pseudomonas aeruginosa NOT DETECTED NOT DETECTED Final   Stenotrophomonas maltophilia NOT DETECTED NOT DETECTED Final   Candida albicans NOT DETECTED NOT DETECTED Final   Candida auris NOT DETECTED NOT DETECTED Final   Candida glabrata NOT DETECTED NOT DETECTED Final   Candida krusei NOT DETECTED NOT DETECTED Final   Candida parapsilosis NOT DETECTED NOT DETECTED Final   Candida tropicalis NOT DETECTED NOT DETECTED Final   Cryptococcus neoformans/gattii NOT DETECTED NOT DETECTED Final    Comment: Performed at [facility], [address], [place], [place] [address]  Aerobic/Anaerobic Culture w Gram Stain (surgical/deep wound)     Status: None (Preliminary result)   Collection Time: 01/06/22  4:18 PM   Specimen: Abscess  Result Value Ref Range Status   Specimen Description   Final    ABSCESS Performed at [place] [address] [place], [place] [address]    Special Requests   Final    NONE Performed at [facility], [place] [address], [place], [place] [address]    Gram Stain   Final    NO SQUAMOUS EPITHELIAL CELLS SEEN NO WBC SEEN  FEW GRAM POSITIVE  COCCI Performed at [facility], [place] [address], [place], [place] [address]    Culture   Final    FEW STAPHYLOCOCCUS AUREUS FEW GROUP A STREP (S.PYOGENES) ISOLATED Beta hemolytic streptococci are predictably susceptible to penicillin and other beta lactams. Susceptibility testing not routinely performed. NO ANAEROBES ISOLATED; CULTURE IN PROGRESS FOR 5 DAYS    Report Status PENDING  Incomplete   Organism ID, Bacteria STAPHYLOCOCCUS AUREUS  Final      Susceptibility   Staphylococcus aureus - MIC*    CIPROFLOXACIN <=0.5 SENSITIVE Sensitive     ERYTHROMYCIN >=8 RESISTANT Resistant     GENTAMICIN <=0.5 SENSITIVE Sensitive     OXACILLIN <=0.25 SENSITIVE Sensitive     TETRACYCLINE <=1 SENSITIVE Sensitive     VANCOMYCIN 1 SENSITIVE Sensitive     TRIMETH/SULFA <=10 SENSITIVE Sensitive     CLINDAMYCIN RESISTANT Resistant     RIFAMPIN <=0.5 SENSITIVE Sensitive     Inducible Clindamycin POSITIVE Resistant     * FEW STAPHYLOCOCCUS AUREUS  Aerobic/Anaerobic Culture w Gram Stain (surgical/deep wound)     Status: None (Preliminary result)   Collection Time: 01/08/22  4:30 PM   Specimen: Abscess  Result Value Ref Range Status   Specimen Description   Final    ABSCESS Performed at [facility], [place] [address] [place], [place] [address]    Special Requests   Final    RIGHT LEG Performed at [facility] [address] [place], [place] [address]    Gram Stain   Final    ABUNDANT WBC PRESENT, PREDOMINANTLY PMN FEW GRAM POSITIVE COCCI Performed at [facility], [place] [address], [place], [place] [address]    Culture   Final    RARE GROUP A STREP (S.PYOGENES) ISOLATED Beta hemolytic streptococci are predictably susceptible to penicillin and other beta lactams. Susceptibility testing not routinely performed. NO ANAEROBES ISOLATED; CULTURE IN PROGRESS FOR 5 DAYS    Report Status PENDING  Incomplete     Serology:   Imaging: If present,  new imagings (plain films, ct scans, and mri) have been personally visualized and interpreted; radiology reports have been reviewed. Decision making incorporated into the Impression / Recommendations.  5/22 mri right tibfib Subcutaneous abscess along the proximal posterior calf measuring 3.5 x 0.9 x 5.4 cm.   Additional probable abscess measuring 1.4 x 0.7 x 5.5 cm at the superficial myofascial interface of the medial soleus distally.   Diffuse intramuscular edema with streaky atrophy and more confluent fatty atrophy of the mediolateral gastrocnemius muscles. This could represent myositis and/or denervation change.   No evidence of osteomyelitis.     Jabier Mutton, [facility] [place] [phone] pager    01/11/2022, 11:15 AM

## 2022-01-11 NOTE — Progress Notes (Signed)
rm 1302, [name], leg wound Abscess, Has Potassium of 2.7 this morning.  thx.

## 2022-01-11 NOTE — TOC Progression Note (Addendum)
Transition of Care (TOC) - Progression Note    Patient Details  Name: [name]. MRN: [id number] Date of Birth: [birth year]  Transition of Care [facility]) CM/SW Contact  Lennart Pall, LCSW Phone Number: 01/11/2022, 11:11 AM  Clinical Narrative:    Per rounds today, it appears pt may be approaching medical readiness to return to his ALF, however,  MD states not ready today.  I did touch base with Lemar Livings, RN at [facility] to provide update and she confirms that they ARE able to readmit him over the weekend if he is cleared.  Have faxed progress notes to her for review and address any questions/ concerns as needed.  Have left a VM for pt's son, [name], with same update.  Continue to follow.  Weekend contacts at [facility] are [place] or [place].  PH# [phone] and FAX# [phone]   Expected Discharge Plan: Assisted Living Barriers to Discharge: Continued Medical Work up  Expected Discharge Plan and Services Expected Discharge Plan: Assisted Living In-house Referral: Clinical Social Work     Living arrangements for the past 2 months: [facility] ([facility])                                       Social Determinants of Health (SDOH) Interventions    Readmission Risk Interventions    01/11/2022   11:11 AM  Readmission Risk Prevention Plan  Post Dischage Appt Complete  Medication Screening Complete  Transportation Screening Complete

## 2022-01-12 DIAGNOSIS — L0291 Cutaneous abscess, unspecified: Secondary | ICD-10-CM | POA: Diagnosis not present

## 2022-01-12 DIAGNOSIS — G20 Parkinson's disease: Secondary | ICD-10-CM | POA: Diagnosis not present

## 2022-01-12 DIAGNOSIS — I10 Essential (primary) hypertension: Secondary | ICD-10-CM | POA: Diagnosis not present

## 2022-01-12 DIAGNOSIS — E876 Hypokalemia: Secondary | ICD-10-CM | POA: Diagnosis not present

## 2022-01-12 LAB — BASIC METABOLIC PANEL
Anion gap: 9 (ref 5–15)
BUN: 14 mg/dL (ref 8–23)
CO2: 24 mmol/L (ref 22–32)
Calcium: 8.4 mg/dL — ABNORMAL LOW (ref 8.9–10.3)
Chloride: 101 mmol/L (ref 98–111)
Creatinine, Ser: 0.66 mg/dL (ref 0.61–1.24)
GFR, Estimated: >60 mL/min (ref >60)
Glucose, Bld: 91 mg/dL (ref 70–99)
Potassium: 3.3 mmol/L — ABNORMAL LOW (ref 3.5–5.1)
Sodium: 134 mmol/L — ABNORMAL LOW (ref 135–145)

## 2022-01-12 MED ORDER — POTASSIUM CHLORIDE 10 MEQ/100ML IV SOLN
10.0000 meq | Freq: Once | INTRAVENOUS | Status: AC
  Administered 2022-01-12: 10 meq via INTRAVENOUS
  Filled 2022-01-12: qty 100

## 2022-01-12 MED ORDER — OXYCODONE HCL 5 MG PO TABS: 5.0000 mg | ORAL_TABLET | Freq: Four times a day (QID) | ORAL | 0 refills | Status: DC | PRN

## 2022-01-12 MED ORDER — CEFADROXIL 500 MG PO CAPS
1000.0000 mg | ORAL_CAPSULE | Freq: Two times a day (BID) | ORAL | 0 refills | Status: AC
Start: 2022-01-12 — End: 2022-02-01

## 2022-01-12 NOTE — Progress Notes (Signed)
Assessment unchanged. Transported via stretcher to ambulance accompanied by Paramedics x 2 to [facility]. Packet given to [facility]. Report called to Facility and given to [place], med tech. Pt currently lives at [facility].

## 2022-01-12 NOTE — Progress Notes (Signed)
[facility]  PROGRESS NOTE  [name]. IWL:[id number] DOB: 28-Jul-1946 DOA: 01/06/2022 PCP: Eulas Post, MD   Brief HPI:    76 year old male with medical history of Parkinson disease, dementia was sent from assisted living facility concerning abscess on right posterior aspect of right leg and heel.  This was drained by hospice nurse at the facility and by the ED provider in the ED checked on it and was still draining pus.  Patient had a fall prior to admission and had femoral neck fracture about 6 weeks ago and has been bedbound since then.  Since patient was gradually declining since hip fracture, hospice consult was placed last month.  Patient is currently followed by [facility].  Patient recently had lower extremity cellulitis which was treated with oral antibiotics.  Later he developed abscess and completed 5 days course of antibiotics.  His appointment was stopped after enrolling in hospice.   Subjective   Patient seen and examined, no new complaints.  Started on cefadroxil yesterday.   Assessment/Plan:    Right calf abscess -Failed outpatient treatment with antibiotics and incision and drainage -Blood culture ID 1/4 staph species -Orthopedics and podiatry were consulted MRI of the right knee and right tibia -subcutaneous abscess along the posterior proximal calf measuring 3.5 x 0.9 x 5.4 cm.  Additional probable abscess measuring 1.4 x 0.7 x 5.5 cm at the superficial myofascial interface of the medial soleus distally.  Diffuse intramuscular edema with streaky atrophy and more confluent fatty atrophy of the medial lateral gastrinomas muscle may represent myositis and or denervation change. -IR consulted 01/08/2022 and patient underwent incision and drainage of right calf abscess  Patient started on vancomycin -ID consulted, blood culture growing 1/2 sets of Staph hominis, likely contamination -Patient was started on IV Vanc -ID has changed antibiotics from  vancomycin to cefadroxil for 3 weeks -And if patient does not get better by that time and son wants surgery at that time consider surgical evaluation for deep soft tissue abscess/myositis   Parkinson disease with dementia -Stable -Continue home medications  Hypokalemia -Potassium is 3.3 this morning -We will give 1 dose of IV KCl 10 mEq   Stage II sacral pressure ulcer injury with right heel eschar/right ankle eschar POA -Wound care following -Continue twice daily dressing changes  Goals of care -Patient is DNR/DNI -Patient was on hospice prior to admission     Medications     vitamin C  500 mg Oral Daily   carbidopa-levodopa  1 tablet Oral TID WC   cefadroxil  1,000 mg Oral BID   citalopram  30 mg Oral Daily   enoxaparin (LOVENOX) injection  40 mg Subcutaneous Q24H   entacapone  200 mg Oral TID WC   feeding supplement  237 mL Oral BID BM   memantine  10 mg Oral BID   multivitamin with minerals  1 tablet Oral Daily   nutrition supplement (JUVEN)  1 packet Oral BID WC   zinc sulfate  220 mg Oral Daily     Data Reviewed:   CBG:  No results for input(s): GLUCAP in the last 168 hours.  SpO2: 100 %    Vitals:   01/11/22 0618 01/11/22 1413 01/11/22 2024 01/12/22 0412  BP: (!) 174/79 (!) 163/77 (!) 144/69 (!) 157/85  Pulse: 71 64 (!) 58 (!) 51  Resp: '16 16 14 14  '$ Temp: 98.5 F (36.9 C) 97.9 F (36.6 C) 98.5 F (36.9 C) 98 F (36.7 C)  TempSrc: Oral Oral Oral Oral  SpO2: 93% 100% 100% 100%  Weight:      Height:          Data Reviewed:  Basic Metabolic Panel: Recent Labs  Lab 01/06/22 1326 01/07/22 0423 01/09/22 0416 01/10/22 0422 01/11/22 0457 01/12/22 0415  NA 143 143 139 138 136 134*  K 2.8* 3.8 2.5* 3.3* 2.7* 3.3*  CL 102 106 101 103 101 101  CO2 34* '30 28 28 30 24  '$ GLUCOSE 144* 96 100* 98 92 91  BUN '15 15 8 10 9 14  '$ CREATININE 0.59* 0.38* 0.53* 0.54* 0.56* 0.66  CALCIUM 9.0 8.5* 8.3* 8.3* 8.4* 8.4*  MG 2.0  --   --   --  1.7  --      CBC: Recent Labs  Lab 01/06/22 1326 01/07/22 0423 01/09/22 0416  WBC 11.4* 8.1 7.0  NEUTROABS 10.1*  --   --   HGB 11.1* 8.9* 10.8*  HCT 32.3* 26.7* 32.0*  MCV 93.1 96.0 92.2  PLT 362 238 278    LFT Recent Labs  Lab 01/06/22 1326 01/07/22 0423 01/09/22 0416  AST 14* 10* 15  ALT '10 11 7  '$ ALKPHOS 135* 109 128*  BILITOT 0.6 0.6 0.9  PROT 6.8 5.5* 6.5  ALBUMIN 2.5* 2.4* 2.5*     Antibiotics: Anti-infectives (From admission, onward)    Start     Dose/Rate Route Frequency Ordered Stop   01/11/22 2200  cefadroxil (DURICEF) capsule 1,000 mg        1,000 mg Oral 2 times daily 01/11/22 1054 02/02/22 0959   01/06/22 2200  vancomycin (VANCOREADY) IVPB 1500 mg/300 mL  Status:  Discontinued        1,500 mg 150 mL/hr over 120 Minutes Intravenous Every 24 hours 01/06/22 1728 01/11/22 1054   01/06/22 1615  vancomycin (VANCOCIN) IVPB 1000 mg/200 mL premix  Status:  Discontinued        1,000 mg 200 mL/hr over 60 Minutes Intravenous  Once 01/06/22 1611 01/06/22 1613   01/06/22 1600  vancomycin (VANCOCIN) IVPB 1000 mg/200 mL premix        1,000 mg 200 mL/hr over 60 Minutes Intravenous  Once 01/06/22 1557 01/06/22 1709        DVT prophylaxis: Lovenox  Code Status: DNR  Family Communication: No family at bedside   CONSULTS IR, orthopedics   Objective    Physical Examination:   General-appears in no acute distress Heart-S1-S2, regular, no murmur auscultated Lungs-clear to auscultation bilaterally, no wheezing or crackles auscultated Abdomen-soft, nontender, no organomegaly Extremities-no edema in the lower extremities Neuro-alert, oriented x3, no focal deficit noted  Status is: Inpatient: Right calf abscess    Pressure Injury 01/06/22 Sacrum Medial Stage 2 -  Partial thickness loss of dermis presenting as a shallow open injury with a red, pink wound bed without slough. redness w/part of epidermis missing (Active)  01/06/22 1800  Location: Sacrum  Location  Orientation: Medial  Staging: Stage 2 -  Partial thickness loss of dermis presenting as a shallow open injury with a red, pink wound bed without slough.  Wound Description (Comments): redness w/part of epidermis missing  Present on Admission: Yes     Pressure Injury 01/06/22 Heel Right Eschar sloughing with purulent drainage at opening. (Active)  01/06/22 1815  Location: Heel  Location Orientation: Right  Staging:   Wound Description (Comments): Eschar sloughing with purulent drainage at opening.  Present on Admission: Yes     Pressure Injury 01/06/22 Ankle Right;Lateral  Eschar intact to malleolous area w/ approx. 1cm of red induration..Eschar surrounded by a halo of white tissue. (Active)  01/06/22   Location: Ankle  Location Orientation: Right;Lateral  Staging:   Wound Description (Comments): Eschar intact to malleolous area w/ approx. 1cm of red induration..Eschar surrounded by a halo of white tissue.  Present on Admission: Yes        [place]   [facility] If 7PM-7AM, please contact night-coverage at [email or web address], Office  [phone]   01/12/2022, 10:46 AM  LOS: 5 days

## 2022-01-12 NOTE — Plan of Care (Signed)
  Problem: Education: Goal: Knowledge of General Education information will improve Description: Including pain rating scale, medication(s)/side effects and non-pharmacologic comfort measures Outcome: Adequate for Discharge   Problem: Health Behavior/Discharge Planning: Goal: Ability to manage health-related needs will improve Outcome: Adequate for Discharge   Problem: Clinical Measurements: Goal: Ability to maintain clinical measurements within normal limits will improve Outcome: Adequate for Discharge Goal: Will remain [place] from infection Outcome: Adequate for Discharge Goal: Diagnostic test results will improve Outcome: Adequate for Discharge Goal: Respiratory complications will improve Outcome: Adequate for Discharge Goal: Cardiovascular complication will be avoided Outcome: Adequate for Discharge   Problem: Activity: Goal: Risk for activity intolerance will decrease Outcome: Adequate for Discharge   Problem: Nutrition: Goal: Adequate nutrition will be maintained Outcome: Adequate for Discharge   Problem: Coping: Goal: Level of anxiety will decrease Outcome: Adequate for Discharge   Problem: Elimination: Goal: Will not experience complications related to bowel motility Outcome: Adequate for Discharge Goal: Will not experience complications related to urinary retention Outcome: Adequate for Discharge   Problem: Pain Managment: Goal: General experience of comfort will improve Outcome: Adequate for Discharge   Problem: Safety: Goal: Ability to remain [place] from injury will improve Outcome: Adequate for Discharge   Problem: Skin Integrity: Goal: Risk for impaired skin integrity will decrease Outcome: Adequate for Discharge   Problem: Malnutrition  (NI-5.2) Goal: Food and/or nutrient delivery Description: Individualized approach for food/nutrient provision. Outcome: Adequate for Discharge   

## 2022-01-12 NOTE — Discharge Summary (Signed)
Physician Discharge Summary   Patient: [name]. MRN: [id number] DOB: 1946/07/01  Admit date:     01/06/2022  Discharge date: 01/12/22  Discharge Physician: Oswald Hillock   PCP: Eulas Post, MD   Recommendations at discharge:   Continue cefadroxil 1000 mg p.o. twice a day for 20 more days till 02/01/2022 Check BMP in 3 days time on 01/14/2022 for serum potassium  Discharge Diagnoses: Principal Problem:   Abscess Active Problems:   Memory difficulties   HYPOKALEMIA   Essential hypertension   CONSTIPATION, CHRONIC   Parkinson disease ([place])   Pressure injury of skin   S/P right hip fracture   Malnutrition of moderate degree   Moderate dementia ([place])  Resolved Problems:   * No resolved hospital problems. *  Hospital Course: 76 year old male with medical history of Parkinson disease, dementia was sent from assisted living facility concerning abscess on right posterior aspect of right leg and heel.  This was drained by hospice nurse at the facility and by the ED provider in the ED checked on it and was still draining pus.  Patient had a fall prior to admission and had femoral neck fracture about 6 weeks ago and has been bedbound since then.  Since patient was gradually declining since hip fracture, hospice consult was placed last month.  Patient is currently followed by [facility].   Patient recently had lower extremity cellulitis which was treated with oral antibiotics.  Later he developed abscess and completed 5 days course of antibiotics.  His appointment was stopped after enrolling in hospice.  Assessment and Plan:   Right calf abscess -Failed outpatient treatment with antibiotics and incision and drainage -Blood culture ID 1/4 staph species -Orthopedics and podiatry were consulted MRI of the right knee and right tibia -subcutaneous abscess along the posterior proximal calf measuring 3.5 x 0.9 x 5.4 cm.  Additional probable abscess measuring 1.4 x 0.7 x 5.5 cm  at the superficial myofascial interface of the medial soleus distally.  Diffuse intramuscular edema with streaky atrophy and more confluent fatty atrophy of the medial lateral gastrinomas muscle may represent myositis and or denervation change. -IR consulted 01/08/2022 and patient underwent incision and drainage of right calf abscess  Patient started on vancomycin -ID consulted, blood culture growing 1/2 sets of Staph hominis, likely contamination -Patient was started on IV Vanc -ID has changed antibiotics from vancomycin to cefadroxil 1000 mg p.o. twice daily for total 3 weeks; continue till 02/01/2022 -And if patient does not get better by that time and son wants surgery at that time consider surgical evaluation for deep soft tissue abscess/myositis     Parkinson disease with dementia -Stable -Continue home medications   Hypokalemia -Potassium is 3.3 this morning -We will give 1 dose of IV KCl 10 mEq -Patient is also on potassium supplementation; may need adjustment of dose based on results of BMP -Check BMP on 01/14/2022     Stage II sacral pressure ulcer injury with right heel eschar/right ankle eschar POA -Wound care following -Continue twice daily dressing changes   Goals of care -Patient is DNR/DNI -Patient was on hospice prior to admission       Consultants: Infectious disease Procedures performed: Incision and drainage of right calf abscess Disposition: Home Diet recommendation:  Discharge Diet Orders (From admission, onward)     Start     Ordered   01/12/22 0000  Diet - low sodium heart healthy        01/12/22 1136  Regular diet DISCHARGE MEDICATION: Allergies as of 01/12/2022       Reactions   Nucynta [tapentadol] Other (See Comments)   dizziness   Pregabalin    REACTION: dizziness and mental status changes   Exelon [rivastigmine]    abd pain        Medication List     STOP taking these medications    losartan 100 MG tablet Commonly  known as: COZAAR       TAKE these medications    acetaminophen 325 MG tablet Commonly known as: TYLENOL Take 650 mg by mouth every 6 (six) hours as needed for moderate pain. What changed: Another medication with the same name was removed. Continue taking this medication, and follow the directions you see here.   amLODipine 10 MG tablet Commonly known as: NORVASC Take 1 tablet (10 mg total) by mouth daily.   Calcium Carbonate-Vitamin D 600-400 MG-UNIT tablet Commonly known as: Calcium 600+D Take 1 tablet by mouth daily.   Calmoseptine 0.44-20.6 % Oint Generic drug: Menthol-Zinc Oxide APPLY TOPICALLY TO BUTTOCKS AND PERINEUM AFTER EACH INCONTINENCE EPISODE AND AFTER SHOWERS.   carbidopa-levodopa 25-100 MG tablet Commonly known as: SINEMET IR Take 1 tablet by mouth 3 (three) times daily.   cefadroxil 500 MG capsule Commonly known as: DURICEF Take 2 capsules (1,000 mg total) by mouth 2 (two) times daily for 20 days.   citalopram 20 MG tablet Commonly known as: CeleXA Take 1.5 tablets (30 mg total) by mouth daily.   entacapone 200 MG tablet Commonly known as: COMTAN TAKE (1) TABLET BY MOUTH (3) TIMES DAILY.   famotidine 20 MG tablet Commonly known as: Pepcid Take 1 tablet (20 mg total) by mouth 2 (two) times daily.   Klor-Con M20 20 MEQ tablet Generic drug: potassium chloride SA TAKE 3 TABLETS (60 MEQ TOTAL) BY MOUTH DAILY.   memantine 10 MG tablet Commonly known as: NAMENDA TAKE 1 TABLET (10 MG TOTAL) BY MOUTH 2 (TWO) TIMES DAILY.   Nuplazid 34 MG Caps Generic drug: Pimavanserin Tartrate TAKE (1) CAPSULE BY MOUTH ONCE DAILY.   oxyCODONE 5 MG immediate release tablet Commonly known as: Oxy IR/ROXICODONE Take 1 tablet (5 mg total) by mouth every 6 (six) hours as needed. What changed: reasons to take this   polyethylene glycol 17 g packet Commonly known as: MIRALAX / GLYCOLAX Take 17 g by mouth daily.   QUEtiapine 25 MG tablet Commonly known as: SEROquel 1  tablet at 2 pm   trimethoprim-polymyxin b ophthalmic solution Commonly known as: Polytrim Place 2 drops into both eyes every 4 (four) hours. Placed 2 drops into both eyes every 4 hours while awake for 7 days               Discharge Care Instructions  (From admission, onward)           Start     Ordered   01/12/22 0000  Discharge wound care:       Comments: Wound care to right posterior LE and heel chronic, nonhealing full thickness wounds:  Cleanse with soap and water, rinse with NS, and pat gently dry. Cover with folded xeroform gauze, Lawson # [id number], top with dry gauze, ABD pad and secure with Kerlix roll gauze/paper tape. Place foot into Prevalon boot. Change twice daily.   01/12/22 1136            Discharge Exam: Filed Weights   01/06/22 1223  Weight: 64.4 kg   General-appears in no acute distress Heart-S1-S2, regular,  no murmur auscultated Lungs-clear to auscultation bilaterally, no wheezing or crackles auscultated Abdomen-soft, nontender, no organomegaly Extremities-no edema in the lower extremities Neuro-alert, oriented x3, no focal deficit noted  Condition at discharge: good  The results of significant diagnostics from this hospitalization (including imaging, microbiology, ancillary and laboratory) are listed below for reference.   Imaging Studies: DG Tibia/Fibula Right  Result Date: 01/06/2022 CLINICAL DATA:  Calf abscesses drain today. EXAM: RIGHT TIBIA AND FIBULA - 2 VIEW COMPARISON:  None Available. FINDINGS: There is gas in the MEDIAL soft tissues likely representing the known abscess which was drained today. No fracture, subluxation or dislocation identified. No knee effusion is present. No focal bony lesions or evidence of acute osteomyelitis noted. IMPRESSION: MEDIAL soft tissue gas likely representing the known abscess which was drained today. No other significant abnormalities. Electronically Signed   By: Margarette Canada M.D.   On: 01/06/2022 14:26    MR TIBIA FIBULA RIGHT W WO CONTRAST  Result Date: 01/08/2022 CLINICAL DATA:  Abscess EXAM: MRI OF LOWER RIGHT EXTREMITY WITHOUT AND WITH CONTRAST TECHNIQUE: Multiplanar, multisequence MR imaging of the right lower extremity was performed both before and after administration of intravenous contrast. CONTRAST:  34m GADAVIST GADOBUTROL 1 MMOL/ML IV SOLN COMPARISON:  Tibia fibula radiograph 01/06/2022 FINDINGS: Bones/Joint/Cartilage The cortex is intact. There is no significant marrow signal alteration. There is a small right knee joint effusion without significant synovial enhancement. Ligaments The interosseous membrane is unremarkable. Muscles and Tendons There is diffuse intramuscular edema and streaky atrophy. There is more confluent fatty atrophy of the medial and lateral gastrocnemius muscles. There is partially visualized peritendinitis of the proximal Achilles. Soft tissues There is superficial soft tissue swelling along the lower leg. There is a subcutaneous, rim enhancing fluid collection with punctate focus of gas along the proximal posterior calf measuring 3.5 x 0.9 x 5.4 cm (post-contrast axial image 24, sagittal image 13). Distally, there is an additional rim enhancing fluid collection which measures 1.4 x 0.7 x 5.5 cm at the superficial myofascial interface of the medial soleus (series 6, image 38, series 14, image 15). IMPRESSION: Subcutaneous abscess along the proximal posterior calf measuring 3.5 x 0.9 x 5.4 cm. Additional probable abscess measuring 1.4 x 0.7 x 5.5 cm at the superficial myofascial interface of the medial soleus distally. Diffuse intramuscular edema with streaky atrophy and more confluent fatty atrophy of the mediolateral gastrocnemius muscles. This could represent myositis and/or denervation change. No evidence of osteomyelitis. Electronically Signed   By: JMaurine SimmeringM.D.   On: 01/08/2022 08:15   UKoreaFNA SOFT TISSUE  Result Date: 01/08/2022 INDICATION: Possible abscess right  posterior calf by MRI. EXAM: ULTRASOUND GUIDED NEEDLE ASPIRATION OF RIGHT CALF MEDICATIONS: None ANESTHESIA/SEDATION: None COMPLICATIONS: None immediate. PROCEDURE: Informed written consent was obtained from the patient's son after a thorough discussion of the procedural risks, benefits and alternatives. All questions were addressed. Maximal Sterile Barrier Technique was utilized including caps, mask, sterile gowns, sterile gloves, sterile drape, hand hygiene and skin antiseptic. A timeout was performed prior to the initiation of the procedure. Ultrasound was performed of soft tissues of the right calf. An 18 gauge spinal needle was advanced into the subcutaneous soft tissues. Aspiration was performed. Lavage was also performed through the needle with 2 mL of sterile saline. A small fluid sample was sent for culture analysis. FINDINGS: No discrete abscess or focal fluid collection is identified by ultrasound. There is an elongated area of irregular subcutaneous fluid in the posterior proximal calf which  was targeted by ultrasound. Aspiration yielded only a tiny amount of fluid and debris. Additional sterile saline lavage yielded very little fluid return. Samples were combined with a small amount of sterile saline and sent for culture analysis. IMPRESSION: No focal soft tissue abscess identified by ultrasound in the posterior right calf. Elongated and irregular subcutaneous fluid was targeted for needle aspiration yielding only a tiny amount of fluid and debris. This was sent for culture analysis. Electronically Signed   By: Aletta Edouard M.D.   On: 01/08/2022 17:14    Microbiology: Results for orders placed or performed during the hospital encounter of 01/06/22  Blood culture (routine x 2)     Status: None   Collection Time: 01/06/22  1:17 PM   Specimen: BLOOD  Result Value Ref Range Status   Specimen Description   Final    BLOOD RIGHT ANTECUBITAL Blood Culture adequate volume BOTTLES DRAWN AEROBIC AND  ANAEROBIC Performed at [facility] [address] [place], [place] [address]    Special Requests   Final    NONE Performed at [facility], [place] [address], [place], [place] [address]    Culture   Final    NO GROWTH 5 DAYS Performed at [facility], [place] [address], [place], [place] [address]    Report Status 01/11/2022 FINAL  Final  Blood culture (routine x 2)     Status: Abnormal   Collection Time: 01/06/22  4:00 PM   Specimen: BLOOD  Result Value Ref Range Status   Specimen Description   Final    BLOOD BLOOD LEFT ARM Performed at [facility] [address] [place], [place] [address]    Special Requests   Final    BOTTLES DRAWN AEROBIC AND ANAEROBIC Blood Culture adequate volume Performed at [facility] [address], [place], [place] [address]    Culture  Setup Time   Final    GRAM POSITIVE COCCI IN CLUSTERS IN BOTH AEROBIC AND ANAEROBIC BOTTLES CRITICAL RESULT CALLED TO, READ BACK BY AND VERIFIED WITH: PHARMD JUSTIN LEGGE ON 01/07/22 @ 1606 BY DRT    Culture (A)  Final    STAPHYLOCOCCUS HOMINIS THE SIGNIFICANCE OF ISOLATING THIS ORGANISM FROM A SINGLE SET OF BLOOD CULTURES WHEN MULTIPLE SETS ARE DRAWN IS UNCERTAIN. PLEASE NOTIFY THE MICROBIOLOGY DEPARTMENT WITHIN ONE WEEK IF SPECIATION AND SENSITIVITIES ARE REQUIRED. Performed at [facility], [place] [address], [place],  [address]    Report Status 01/09/2022 FINAL  Final  Blood Culture ID Panel (Reflexed)     Status: Abnormal   Collection Time: 01/06/22  4:00 PM  Result Value Ref Range Status   Enterococcus faecalis NOT DETECTED NOT DETECTED Final   Enterococcus Faecium NOT DETECTED NOT DETECTED Final   Listeria monocytogenes NOT DETECTED NOT DETECTED Final   Staphylococcus species DETECTED (A) NOT DETECTED Final    Comment: CRITICAL RESULT CALLED TO, READ BACK BY AND VERIFIED WITH: PHARMD JUSTIN LEGGE ON 01/07/22 @ 1606 BY  DRT    Staphylococcus aureus (BCID) NOT DETECTED NOT DETECTED Final   Staphylococcus epidermidis NOT DETECTED NOT DETECTED Final   Staphylococcus lugdunensis NOT DETECTED NOT DETECTED Final   Streptococcus species NOT DETECTED NOT DETECTED Final   Streptococcus agalactiae NOT DETECTED NOT DETECTED Final   Streptococcus pneumoniae NOT DETECTED NOT DETECTED Final   Streptococcus pyogenes NOT DETECTED NOT DETECTED Final   A.calcoaceticus-baumannii NOT DETECTED NOT DETECTED Final   Bacteroides fragilis NOT DETECTED  NOT DETECTED Final   Enterobacterales NOT DETECTED NOT DETECTED Final   Enterobacter cloacae complex NOT DETECTED NOT DETECTED Final   Escherichia coli NOT DETECTED NOT DETECTED Final   Klebsiella aerogenes NOT DETECTED NOT DETECTED Final   Klebsiella oxytoca NOT DETECTED NOT DETECTED Final   Klebsiella pneumoniae NOT DETECTED NOT DETECTED Final   Proteus species NOT DETECTED NOT DETECTED Final   Salmonella species NOT DETECTED NOT DETECTED Final   Serratia marcescens NOT DETECTED NOT DETECTED Final   Haemophilus influenzae NOT DETECTED NOT DETECTED Final   Neisseria meningitidis NOT DETECTED NOT DETECTED Final   Pseudomonas aeruginosa NOT DETECTED NOT DETECTED Final   Stenotrophomonas maltophilia NOT DETECTED NOT DETECTED Final   Candida albicans NOT DETECTED NOT DETECTED Final   Candida auris NOT DETECTED NOT DETECTED Final   Candida glabrata NOT DETECTED NOT DETECTED Final   Candida krusei NOT DETECTED NOT DETECTED Final   Candida parapsilosis NOT DETECTED NOT DETECTED Final   Candida tropicalis NOT DETECTED NOT DETECTED Final   Cryptococcus neoformans/gattii NOT DETECTED NOT DETECTED Final    Comment: Performed at [facility], [place] [address], [place], [place] [address]  Aerobic/Anaerobic Culture w Gram Stain (surgical/deep wound)     Status: None (Preliminary result)   Collection Time: 01/06/22  4:18 PM   Specimen: Abscess  Result Value Ref Range Status    Specimen Description   Final    ABSCESS Performed at [facility] [address], [place], [place] [address]    Special Requests   Final    NONE Performed at [facility], [place] [address] [place], [place] [address]    Gram Stain   Final    NO SQUAMOUS EPITHELIAL CELLS SEEN NO WBC SEEN FEW GRAM POSITIVE COCCI Performed at [facility], [place] [address] [place], [place] [address]    Culture   Final    FEW STAPHYLOCOCCUS AUREUS FEW GROUP A STREP (S.PYOGENES) ISOLATED Beta hemolytic streptococci are predictably susceptible to penicillin and other beta lactams. Susceptibility testing not routinely performed. NO ANAEROBES ISOLATED; CULTURE IN PROGRESS FOR 5 DAYS    Report Status PENDING  Incomplete   Organism ID, Bacteria STAPHYLOCOCCUS AUREUS  Final      Susceptibility   Staphylococcus aureus - MIC*    CIPROFLOXACIN <=0.5 SENSITIVE Sensitive     ERYTHROMYCIN >=8 RESISTANT Resistant     GENTAMICIN <=0.5 SENSITIVE Sensitive     OXACILLIN <=0.25 SENSITIVE Sensitive     TETRACYCLINE <=1 SENSITIVE Sensitive     VANCOMYCIN 1 SENSITIVE Sensitive     TRIMETH/SULFA <=10 SENSITIVE Sensitive     CLINDAMYCIN RESISTANT Resistant     RIFAMPIN <=0.5 SENSITIVE Sensitive     Inducible Clindamycin POSITIVE Resistant     * FEW STAPHYLOCOCCUS AUREUS  Aerobic/Anaerobic Culture w Gram Stain (surgical/deep wound)     Status: None (Preliminary result)   Collection Time: 01/08/22  4:30 PM   Specimen: Abscess  Result Value Ref Range Status   Specimen Description   Final    ABSCESS Performed at [facility], [place] [address] [place], [place] [address]    Special Requests   Final    RIGHT LEG Performed at [facility] [address] [place], [place] [address]    Gram Stain   Final    ABUNDANT WBC PRESENT, PREDOMINANTLY PMN FEW GRAM POSITIVE COCCI Performed at [facility], [place] [address] [place], [place]  [address]  Culture   Final    RARE GROUP A STREP (S.PYOGENES) ISOLATED Beta hemolytic streptococci are predictably susceptible to penicillin and other beta lactams. Susceptibility testing not routinely performed. NO ANAEROBES ISOLATED; CULTURE IN PROGRESS FOR 5 DAYS    Report Status PENDING  Incomplete    Labs: CBC: Recent Labs  Lab 01/06/22 1326 01/07/22 0423 01/09/22 0416  WBC 11.4* 8.1 7.0  NEUTROABS 10.1*  --   --   HGB 11.1* 8.9* 10.8*  HCT 32.3* 26.7* 32.0*  MCV 93.1 96.0 92.2  PLT 362 238 458   Basic Metabolic Panel: Recent Labs  Lab 01/06/22 1326 01/07/22 0423 01/09/22 0416 01/10/22 0422 01/11/22 0457 01/12/22 0415  NA 143 143 139 138 136 134*  K 2.8* 3.8 2.5* 3.3* 2.7* 3.3*  CL 102 106 101 103 101 101  CO2 34* '30 28 28 30 24  '$ GLUCOSE 144* 96 100* 98 92 91  BUN '15 15 8 10 9 14  '$ CREATININE 0.59* 0.38* 0.53* 0.54* 0.56* 0.66  CALCIUM 9.0 8.5* 8.3* 8.3* 8.4* 8.4*  MG 2.0  --   --   --  1.7  --    Liver Function Tests: Recent Labs  Lab 01/06/22 1326 01/07/22 0423 01/09/22 0416  AST 14* 10* 15  ALT '10 11 7  '$ ALKPHOS 135* 109 128*  BILITOT 0.6 0.6 0.9  PROT 6.8 5.5* 6.5  ALBUMIN 2.5* 2.4* 2.5*   CBG: No results for input(s): GLUCAP in the last 168 hours.  Discharge time spent: greater than 30 minutes.  Signed: Oswald Hillock, MD [facility] 01/12/2022

## 2022-01-12 NOTE — NC FL2 (Signed)
[place] LEVEL OF CARE SCREENING TOOL     IDENTIFICATION  Patient Name: [name]. Birthdate: [birth year] Sex: male Admission Date (Current Location): 01/06/2022  [facility] and [place] Number:  Herbalist and Address:  [facility],  [place], [place]      Provider Number: [id number]  Attending Physician Name and Address:  Oswald Hillock, MD  Relative Name and Phone Number:  Valor Quaintance [phone]    Current Level of Care: Hospital Recommended Level of Care: Assisted Living Facility Prior Approval Number:    Date Approved/Denied:   PASRR Number:    Discharge Plan:  (ALF)    Current Diagnoses: Patient Active Problem List   Diagnosis Date Noted   Moderate dementia ([place])    Malnutrition of moderate degree 01/10/2022   S/P right hip fracture    Pressure injury of skin 01/08/2022   Abscess 01/06/2022   Bradycardia 09/10/2019   Delusional disorder ([place]) 01/14/2019   Hallucinations 01/14/2019   Actinic keratoses 07/27/2017   Trochanteric bursitis, left hip 12/04/2016   Parkinson disease ([place]) 05/16/2016   Abnormality of gait 05/16/2016   Knee pain 11/01/2013   Traumatic bursitis 11/01/2013   Hx of fall 11/01/2013   Memory difficulties 04/22/2013   Hemorrhoids 02/24/2013   PRIMARY CENTRAL SLEEP APNEA 05/21/2010   NOCTURIA 05/21/2010   HYPERSOMNIA, ASSOCIATED WITH SLEEP APNEA 03/26/2010   ABDOMINAL PAIN RIGHT LOWER QUADRANT 03/20/2010   TREMOR, ESSENTIAL 11/22/2009   SPINAL STENOSIS, LUMBAR 06/07/2009   OSTEOARTHRITIS, LOWER LEG, LEFT 05/16/2009   MYOFASCIAL PAIN SYNDROME 05/16/2009   URINARY FREQUENCY, CHRONIC 02/09/2009   DISUSE OSTEOPOROSIS 09/07/2008   HYPOGONADISM, MALE 07/01/2008   HYPOKALEMIA 04/19/2008   SYNCOPE 04/19/2008   UNS ADVRS EFF UNS RX MEDICINAL&BIOLOGICAL SBSTNC 02/26/2008   CONSTIPATION, CHRONIC 11/23/2007   HYPERLIPIDEMIA 05/15/2007   Right-sided low back pain without sciatica  05/15/2007   Osteoporosis 05/15/2007   DIVERTICULOSIS, COLON 02/23/2007   Essential hypertension 02/12/2007   GERD 02/12/2007   GASTRITIS 02/12/2007    Orientation RESPIRATION BLADDER Height & Weight     Self  Normal Incontinent Weight: 142 lb (64.4 kg) Height:  '5\' 9"'$  (175.3 cm)  BEHAVIORAL SYMPTOMS/MOOD NEUROLOGICAL BOWEL NUTRITION STATUS      Incontinent Diet  AMBULATORY STATUS COMMUNICATION OF NEEDS Skin   Extensive Assist   PU Stage and Appropriate Care   PU Stage 2 Dressing: BID (Cleanse with soap and water, rinse with NS, and pat gently dry. Cover with folded xeroform gauze, Lawson # [id number], top with dry gauze, ABD pad and secure with Kerlix roll gauze/paper tape. Place foot into Prevalon boot. Change twice daily.)                   Personal Care Assistance Level of Assistance  Bathing, Feeding, Dressing, Total care Bathing Assistance: Maximum assistance Feeding assistance: Limited assistance Dressing Assistance: Maximum assistance Total Care Assistance: Maximum assistance   Functional Limitations Info             SPECIAL CARE FACTORS FREQUENCY                       Contractures Contractures Info: Not present    Additional Factors Info  Code Status, Allergies, Psychotropic Code Status Info: DNR Allergies Info: Nucynta (Tapentadol), Pregabalin, Exelon (Rivastigmine Psychotropic Info: see MAR         Current Medications (01/12/2022):  This is the current hospital active medication list Current  Facility-Administered Medications  Medication Dose Route Frequency Provider Last Rate Last Admin   ascorbic acid (VITAMIN C) tablet 500 mg  500 mg Oral Daily Darrick Meigs, Gagan S, MD   500 mg at 01/12/22 1205   carbidopa-levodopa (SINEMET IR) 25-100 MG per tablet immediate release 1 tablet  1 tablet Oral TID WC Georgette Shell, MD   1 tablet at 01/12/22 1204   cefadroxil (DURICEF) capsule 1,000 mg  1,000 mg Oral BID Vu, Trung T, MD   1,000 mg at 01/12/22 1204    citalopram (CELEXA) tablet 30 mg  30 mg Oral Daily Georgette Shell, MD   30 mg at 01/12/22 1205   enoxaparin (LOVENOX) injection 40 mg  40 mg Subcutaneous Q24H Georgette Shell, MD   40 mg at 01/11/22 2325   entacapone (COMTAN) tablet 200 mg  200 mg Oral TID WC Georgette Shell, MD   200 mg at 01/12/22 1206   feeding supplement (ENSURE ENLIVE / ENSURE PLUS) liquid 237 mL  237 mL Oral BID BM Oswald Hillock, MD   237 mL at 01/12/22 1100   memantine (NAMENDA) tablet 10 mg  10 mg Oral BID Georgette Shell, MD   10 mg at 01/12/22 1206   multivitamin with minerals tablet 1 tablet  1 tablet Oral Daily Oswald Hillock, MD   1 tablet at 01/12/22 1206   nutrition supplement (JUVEN) (JUVEN) powder packet 1 packet  1 packet Oral BID WC Oswald Hillock, MD   1 packet at 01/12/22 0830   oxyCODONE (Oxy IR/ROXICODONE) immediate release tablet 5 mg  5 mg Oral Q6H PRN Georgette Shell, MD       zinc sulfate capsule 220 mg  220 mg Oral Daily Oswald Hillock, MD   220 mg at 01/12/22 1206     Discharge Medications: Please see discharge summary for a list of discharge medications.  Relevant Imaging Results:  Relevant Lab Results:   Additional Information    Loleta Frommelt, LCSW

## 2022-01-12 NOTE — TOC Transition Note (Signed)
Transition of Care [facility]) - CM/SW Discharge Note   Patient Details  Name: [name]. MRN: [id number] Date of Birth: [birth year]  Transition of Care [facility]) CM/SW Contact:  Lennart Pall, LCSW Phone Number: 01/12/2022, 1:31 PM   Clinical Narrative:    Pt medically cleared for return to ALF at [facility] today.  Pt's sons aware and agreeable with requesting PT[facility] transport.  Have confirmed with facility [facility]) that they are prepared for his return today.  [facility] called at 1:30pm.  No further TOC needs.   Final next level of care: Assisted Living Barriers to Discharge: Barriers Resolved   Patient Goals and CMS Choice Patient states their goals for this hospitalization and ongoing recovery are:: son hopeful to treat infection and pt to return to ALF      Discharge Placement                Patient to be transferred to facility by: returning to ALF at [facility] Name of family member notified: sons, [name] and Aaron Edelman Patient and family notified of of transfer: 01/12/22  Discharge Plan and Services In-house Referral: Clinical Social Work              DME Arranged: N/A DME Agency: NA                  Social Determinants of Health ([place]) Interventions     Readmission Risk Interventions    01/11/2022   11:11 AM  Readmission Risk Prevention Plan  Post Dischage Appt Complete  Medication Screening Complete  Transportation Screening Complete

## 2022-01-12 NOTE — Plan of Care (Signed)
  Problem: Education: Goal: Knowledge of General Education information will improve Description: Including pain rating scale, medication(s)/side effects and non-pharmacologic comfort measures Outcome: Adequate for Discharge   Problem: Health Behavior/Discharge Planning: Goal: Ability to manage health-related needs will improve Outcome: Adequate for Discharge   Problem: Clinical Measurements: Goal: Ability to maintain clinical measurements within normal limits will improve Outcome: Adequate for Discharge Goal: Will remain [place] from infection Outcome: Adequate for Discharge Goal: Diagnostic test results will improve Outcome: Adequate for Discharge Goal: Respiratory complications will improve Outcome: Adequate for Discharge Goal: Cardiovascular complication will be avoided Outcome: Adequate for Discharge   Problem: Activity: Goal: Risk for activity intolerance will decrease Outcome: Adequate for Discharge   Problem: Nutrition: Goal: Adequate nutrition will be maintained Outcome: Adequate for Discharge   Problem: Coping: Goal: Level of anxiety will decrease Outcome: Adequate for Discharge   Problem: Elimination: Goal: Will not experience complications related to bowel motility Outcome: Adequate for Discharge Goal: Will not experience complications related to urinary retention Outcome: Adequate for Discharge   Problem: Pain Managment: Goal: General experience of comfort will improve Outcome: Adequate for Discharge   Problem: Safety: Goal: Ability to remain [place] from injury will improve Outcome: Adequate for Discharge   Problem: Skin Integrity: Goal: Risk for impaired skin integrity will decrease Outcome: Adequate for Discharge   Problem: Malnutrition  (NI-5.2) Goal: Food and/or nutrient delivery Description: Individualized approach for food/nutrient provision. Outcome: Adequate for Discharge   

## 2022-01-13 LAB — AEROBIC/ANAEROBIC CULTURE W GRAM STAIN (SURGICAL/DEEP WOUND): Gram Stain: NONE SEEN

## 2022-01-17 LAB — AEROBIC/ANAEROBIC CULTURE W GRAM STAIN (SURGICAL/DEEP WOUND)

## 2022-02-27 ENCOUNTER — Ambulatory Visit: Payer: PPO | Admitting: Adult Health

## 2022-03-13 ENCOUNTER — Telehealth: Payer: Self-pay

## 2022-03-13 NOTE — Telephone Encounter (Signed)
No documented CPE in last year.  LVM on son Jason's cell to call office to schedule.

## 2022-03-20 ENCOUNTER — Other Ambulatory Visit: Payer: Self-pay | Admitting: Family Medicine

## 2022-03-20 ENCOUNTER — Other Ambulatory Visit: Payer: Self-pay | Admitting: Neurology

## 2022-03-27 NOTE — Telephone Encounter (Signed)
Spoke to son [name]. Pt is currently in hospice.

## 2022-04-02 ENCOUNTER — Other Ambulatory Visit: Payer: Self-pay | Admitting: Family Medicine

## 2022-05-06 ENCOUNTER — Other Ambulatory Visit: Payer: Self-pay | Admitting: Family Medicine

## 2022-05-06 NOTE — Telephone Encounter (Signed)
Last OV- 10/16/21 Medication not listed on med list.   No future OV Scheduled

## 2022-05-09 ENCOUNTER — Telehealth: Payer: Self-pay | Admitting: Family Medicine

## 2022-05-09 NOTE — Telephone Encounter (Signed)
Tried calling patient to  schedule Medicare Annual Wellness Visit (AWV) either virtually or in office.  No answer  Last AWV ;05/17/21  please schedule at anytime with [facility] Nurse Health Advisor 1 or 2

## 2022-05-16 ENCOUNTER — Telehealth: Payer: Self-pay | Admitting: Family Medicine

## 2022-05-16 NOTE — Telephone Encounter (Signed)
Sandy from [facility] dropped off forms for Dr. to sign and send back to their facility asap. It was sent via fax however all the pages didn't come through.   Forms have been placed in red folder.   Please advise.

## 2022-05-17 ENCOUNTER — Other Ambulatory Visit: Payer: Self-pay | Admitting: Family Medicine

## 2022-05-17 NOTE — Telephone Encounter (Signed)
Forms faxed to [facility]

## 2022-06-03 ENCOUNTER — Ambulatory Visit (INDEPENDENT_AMBULATORY_CARE_PROVIDER_SITE_OTHER): Payer: PPO

## 2022-06-03 VITALS — Ht 69.0 in | Wt 142.0 lb

## 2022-06-03 DIAGNOSIS — Z0000 Encounter for general adult medical examination without abnormal findings: Secondary | ICD-10-CM

## 2022-06-03 NOTE — Patient Instructions (Addendum)
[name] , Thank you for taking time to come for your Medicare Wellness Visit. I appreciate your ongoing commitment to your health goals. Please review the following plan we discussed and let me know if I can assist you in the future.   These are the goals we discussed:  Goals       patient      To stay independent / continue at home      Patient Stated      To maintain your health with exercise       Patient Stated      I would like to get my incontinence under control and work on my balance.       Stay Healthy (pt-stated)        This is a list of the screening recommended for you and due dates:  Health Maintenance  Topic Date Due   COVID-19 Vaccine (3 - Moderna risk series) 06/19/2022*   Zoster (Shingles) Vaccine (1 of 2) 09/03/2022*   Flu Shot  11/17/2022*   Tetanus Vaccine  03/07/2024   Pneumonia Vaccine  Completed   Hepatitis C Screening: USPSTF Recommendation to screen - Ages 18-79 yo.  Completed   HPV Vaccine  Aged Out   Colon Cancer Screening  Discontinued  *Topic was postponed. The date shown is not the original due date.  Opioid Pain Medicine Management Opioids are powerful medicines that are used to treat moderate to severe pain. When used for short periods of time, they can help you to: Sleep better. Do better in physical or occupational therapy. Feel better in the first few days after an injury. Recover from surgery. Opioids should be taken with the supervision of a trained health care provider. They should be taken for the shortest period of time possible. This is because opioids can be addictive, and the longer you take opioids, the greater your risk of addiction. This addiction can also be called opioid use disorder. What are the risks? Using opioid pain medicines for longer than 3 days increases your risk of side effects. Side effects include: Constipation. Nausea and vomiting. Breathing difficulties (respiratory depression). Drowsiness. Confusion. Opioid  use disorder. Itching. Taking opioid pain medicine for a long period of time can affect your ability to do daily tasks. It also puts you at risk for: Motor vehicle crashes. Depression. Suicide. Heart attack. Overdose, which can be life-threatening. What is a pain treatment plan? A pain treatment plan is an agreement between you and your health care provider. Pain is unique to each person, and treatments vary depending on your condition. To manage your pain, you and your health care provider need to work together. To help you do this: Discuss the goals of your treatment, including how much pain you might expect to have and how you will manage the pain. Review the risks and benefits of taking opioid medicines. Remember that a good treatment plan uses more than one approach and minimizes the chance of side effects. Be honest about the amount of medicines you take and about any drug or alcohol use. Get pain medicine prescriptions from only one health care provider. Pain can be managed with many types of alternative treatments. Ask your health care provider to refer you to one or more specialists who can help you manage pain through: Physical or occupational therapy. Counseling (cognitive behavioral therapy). Good nutrition. Biofeedback. Massage. Meditation. Non-opioid medicine. Following a gentle exercise program. How to use opioid pain medicine Taking medicine Take your pain medicine exactly  as told by your health care provider. Take it only when you need it. If your pain gets less severe, you may take less than your prescribed dose if your health care provider approves. If you are not having pain, do nottake pain medicine unless your health care provider tells you to take it. If your pain is severe, do nottry to treat it yourself by taking more pills than instructed on your prescription. Contact your health care provider for help. Write down the times when you take your pain medicine. It  is easy to become confused while on pain medicine. Writing the time can help you avoid overdose. Take other over-the-counter or prescription medicines only as told by your health care provider. Keeping yourself and others safe  While you are taking opioid pain medicine: Do not drive, use machinery, or power tools. Do not sign legal documents. Do not drink alcohol. Do not take sleeping pills. Do not supervise children by yourself. Do not do activities that require climbing or being in high places. Do not go to a lake, river, ocean, spa, or swimming pool. Do not share your pain medicine with anyone. Keep pain medicine in a locked cabinet or in a secure area where pets and children cannot reach it. Stopping your use of opioids If you have been taking opioid medicine for more than a few weeks, you may need to slowly decrease (taper) how much you take until you stop completely. Tapering your use of opioids can decrease your risk of symptoms of withdrawal, such as: Pain and cramping in the abdomen. Nausea. Sweating. Sleepiness. Restlessness. Uncontrollable shaking (tremors). Cravings for the medicine. Do not attempt to taper your use of opioids on your own. Talk with your health care provider about how to do this. Your health care provider may prescribe a step-down schedule based on how much medicine you are taking and how long you have been taking it. Getting rid of leftover pills Do not save any leftover pills. Get rid of leftover pills safely by: Taking the medicine to a prescription take-back program. This is usually offered by the county or law enforcement. Bringing them to a pharmacy that has a drug disposal container. Flushing them down the toilet. Check the label or package insert of your medicine to see whether this is safe to do. Throwing them out in the trash. Check the label or package insert of your medicine to see whether this is safe to do. If it is safe to throw it out, remove  the medicine from the original container, put it into a sealable bag or container, and mix it with used coffee grounds, food scraps, dirt, or cat litter before putting it in the trash. Follow these instructions at home: Activity Do exercises as told by your health care provider. Avoid activities that make your pain worse. Return to your normal activities as told by your health care provider. Ask your health care provider what activities are safe for you. General instructions You may need to take these actions to prevent or treat constipation: Drink enough fluid to keep your urine pale yellow. Take over-the-counter or prescription medicines. Eat foods that are high in fiber, such as beans, whole grains, and fresh fruits and vegetables. Limit foods that are high in fat and processed sugars, such as fried or sweet foods. Keep all follow-up visits. This is important. Where to find support If you have been taking opioids for a long time, you may benefit from receiving support for quitting from  a local support group or counselor. Ask your health care provider for a referral to these resources in your area. Where to find more information Centers for Disease Control and Prevention (CDC): http://www.wolf.info/ [place]. Food and Drug Administration (FDA): [place] Get help right away if: You may have taken too much of an opioid (overdosed). Common symptoms of an overdose: Your breathing is slower or more shallow than normal. You have a very slow heartbeat (pulse). You have slurred speech. You have nausea and vomiting. Your pupils become very small. You have other potential symptoms: You are very confused. You faint or feel like you will faint. You have cold, clammy skin. You have blue lips or fingernails. You have thoughts of harming yourself or harming others. These symptoms may represent a serious problem that is an emergency. Do not wait to see if the symptoms will go away. Get medical help right away.  Call your local emergency services (911 in the [place] Do not drive yourself to the hospital.  If you ever feel like you may hurt yourself or others, or have thoughts about taking your own life, get help right away. Go to your nearest emergency department or: Call your local emergency services (911 in the [place] Call the [facility] (972)580-7215 in the [place] Call a suicide crisis helpline, such as the [place] at (351)158-4676 or 988 in the [place]. This is open 24 hours a day in the [place] Text the Crisis Text Line at 609-297-2870 (in the [place] Summary Opioid medicines can help you manage moderate to severe pain for a short period of time. A pain treatment plan is an agreement between you and your health care provider. Discuss the goals of your treatment, including how much pain you might expect to have and how you will manage the pain. If you think that you or someone else may have taken too much of an opioid, get medical help right away. This information is not intended to replace advice given to you by your health care provider. Make sure you discuss any questions you have with your health care provider. Document Revised: 02/28/2021 Document Reviewed: 11/15/2020 Elsevier Patient Education  [place] directives: Please bring a copy of your health care power of attorney and living will to the office to be added to your chart at your convenience.   Conditions/risks identified: None  Next appointment: Follow up in one year for your annual wellness visit.    Preventive Care 65 Years and Older, Male  Preventive care refers to lifestyle choices and visits with your health care provider that can promote health and wellness. What does preventive care include? A yearly physical exam. This is also called an annual well check. Dental exams once or twice a year. Routine eye exams. Ask your health care provider how often you should have  your eyes checked. Personal lifestyle choices, including: Daily care of your teeth and gums. Regular physical activity. Eating a healthy diet. Avoiding tobacco and drug use. Limiting alcohol use. Practicing safe sex. Taking low doses of aspirin every day. Taking vitamin and mineral supplements as recommended by your health care provider. What happens during an annual well check? The services and screenings done by your health care provider during your annual well check will depend on your age, overall health, lifestyle risk factors, and family history of disease. Counseling  Your health care provider may ask you questions about your: Alcohol use. Tobacco use. Drug use. Emotional well-being.  Home and relationship well-being. Sexual activity. Eating habits. History of falls. Memory and ability to understand (cognition). Work and work Statistician. Screening  You may have the following tests or measurements: Height, weight, and BMI. Blood pressure. Lipid and cholesterol levels. These may be checked every 5 years, or more frequently if you are over 76 years old. Skin check. Lung cancer screening. You may have this screening every year starting at age 15 if you have a 30-pack-year history of smoking and currently smoke or have quit within the past 15 years. Fecal occult blood test (FOBT) of the stool. You may have this test every year starting at age 59. Flexible sigmoidoscopy or colonoscopy. You may have a sigmoidoscopy every 5 years or a colonoscopy every 10 years starting at age 32. Prostate cancer screening. Recommendations will vary depending on your family history and other risks. Hepatitis C blood test. Hepatitis B blood test. Sexually transmitted disease (STD) testing. Diabetes screening. This is done by checking your blood sugar (glucose) after you have not eaten for a while (fasting). You may have this done every 1-3 years. Abdominal aortic aneurysm (AAA) screening. You may  need this if you are a current or former smoker. Osteoporosis. You may be screened starting at age 33 if you are at high risk. Talk with your health care provider about your test results, treatment options, and if necessary, the need for more tests. Vaccines  Your health care provider may recommend certain vaccines, such as: Influenza vaccine. This is recommended every year. Tetanus, diphtheria, and acellular pertussis (Tdap, Td) vaccine. You may need a Td booster every 10 years. Zoster vaccine. You may need this after age 71. Pneumococcal 13-valent conjugate (PCV13) vaccine. One dose is recommended after age 34. Pneumococcal polysaccharide (PPSV23) vaccine. One dose is recommended after age 74. Talk to your health care provider about which screenings and vaccines you need and how often you need them. This information is not intended to replace advice given to you by your health care provider. Make sure you discuss any questions you have with your health care provider. Document Released: 09/01/2015 Document Revised: 04/24/2016 Document Reviewed: 06/06/2015 Elsevier Interactive Patient Education  2017 [place] Prevention in the Home Falls can cause injuries. They can happen to people of all ages. There are many things you can do to make your home safe and to help prevent falls. What can I do on the outside of my home? Regularly fix the edges of walkways and driveways and fix any cracks. Remove anything that might make you trip as you walk through a door, such as a raised step or threshold. Trim any bushes or trees on the path to your home. Use bright outdoor lighting. Clear any walking paths of anything that might make someone trip, such as rocks or tools. Regularly check to see if handrails are loose or broken. Make sure that both sides of any steps have handrails. Any raised decks and porches should have guardrails on the edges. Have any leaves, snow, or ice cleared  regularly. Use sand or salt on walking paths during winter. Clean up any spills in your garage right away. This includes oil or grease spills. What can I do in the bathroom? Use night lights. Install grab bars by the toilet and in the tub and shower. Do not use towel bars as grab bars. Use non-skid mats or decals in the tub or shower. If you need to sit down in the shower, use a plastic, non-slip  stool. Keep the floor dry. Clean up any water that spills on the floor as soon as it happens. Remove soap buildup in the tub or shower regularly. Attach bath mats securely with double-sided non-slip rug tape. Do not have throw rugs and other things on the floor that can make you trip. What can I do in the bedroom? Use night lights. Make sure that you have a light by your bed that is easy to reach. Do not use any sheets or blankets that are too big for your bed. They should not hang down onto the floor. Have a firm chair that has side arms. You can use this for support while you get dressed. Do not have throw rugs and other things on the floor that can make you trip. What can I do in the kitchen? Clean up any spills right away. Avoid walking on wet floors. Keep items that you use a lot in easy-to-reach places. If you need to reach something above you, use a strong step stool that has a grab bar. Keep electrical cords out of the way. Do not use floor polish or wax that makes floors slippery. If you must use wax, use non-skid floor wax. Do not have throw rugs and other things on the floor that can make you trip. What can I do with my stairs? Do not leave any items on the stairs. Make sure that there are handrails on both sides of the stairs and use them. Fix handrails that are broken or loose. Make sure that handrails are as long as the stairways. Check any carpeting to make sure that it is firmly attached to the stairs. Fix any carpet that is loose or worn. Avoid having throw rugs at the top or  bottom of the stairs. If you do have throw rugs, attach them to the floor with carpet tape. Make sure that you have a light switch at the top of the stairs and the bottom of the stairs. If you do not have them, ask someone to add them for you. What else can I do to help prevent falls? Wear shoes that: Do not have high heels. Have rubber bottoms. Are comfortable and fit you well. Are closed at the toe. Do not wear sandals. If you use a stepladder: Make sure that it is fully opened. Do not climb a closed stepladder. Make sure that both sides of the stepladder are locked into place. Ask someone to hold it for you, if possible. Clearly mark and make sure that you can see: Any grab bars or handrails. First and last steps. Where the edge of each step is. Use tools that help you move around (mobility aids) if they are needed. These include: Canes. Walkers. Scooters. Crutches. Turn on the lights when you go into a dark area. Replace any light bulbs as soon as they burn out. Set up your furniture so you have a clear path. Avoid moving your furniture around. If any of your floors are uneven, fix them. If there are any pets around you, be aware of where they are. Review your medicines with your doctor. Some medicines can make you feel dizzy. This can increase your chance of falling. Ask your doctor what other things that you can do to help prevent falls. This information is not intended to replace advice given to you by your health care provider. Make sure you discuss any questions you have with your health care provider. Document Released: 06/01/2009 Document Revised: 01/11/2016 Document Reviewed: 09/09/2014  Chartered certified accountant Patient Education  [employer].

## 2022-06-03 NOTE — Progress Notes (Signed)
Subjective:   Constant Mandeville. is a 76 y.o. male who presents for Medicare Annual/Subsequent preventive examination.  Review of Systems    Virtual Visit via Telephone Note  I connected with  Standley Brooking. on 06/03/22 at  9:30 AM EDT by telephone and verified that I am speaking with the correct person using two identifiers.  Location: Patient: Home Provider: Office Persons participating in the virtual visit: patient/Nurse Health Advisor   I discussed the limitations, risks, security and privacy concerns of performing an evaluation and management service by telephone and the availability of in person appointments. The patient expressed understanding and agreed to proceed.  Interactive audio and video telecommunications were attempted between this nurse and patient, however failed, due to patient having technical difficulties OR patient did not have access to video capability.  We continued and completed visit with audio only.  Some vital signs may be absent or patient reported.   Criselda Peaches, LPN  Cardiac Risk Factors include: advanced age (>40mn, >>35women);hypertension;male gender     Objective:    Today's Vitals   06/03/22 0939  Weight: 142 lb (64.4 kg)  Height: '5\' 9"'$  (1.753 m)   Body mass index is 20.97 kg/m.     06/03/2022   10:00 AM 01/06/2022    9:00 PM 05/17/2021    2:40 PM 12/10/2020    3:05 PM 06/22/2020    2:48 PM 03/31/2018   10:17 AM 03/28/2017    9:45 AM  Advanced Directives  Does Patient Have a Medical Advance Directive? Yes Unable to assess, patient is non-responsive or altered mental status Yes Yes Yes Yes Yes  Type of Advance Directive [place] will  [place] will [place] will [place] will    Does patient want to make changes to medical advance directive?     No - Patient declined    Copy of [place] Chart? No -  copy requested  No - copy requested  No - copy requested    Would patient like information on creating a medical advance directive?    No - Patient declined       Current Medications (verified) Outpatient Encounter Medications as of 06/03/2022  Medication Sig   acetaminophen (TYLENOL) 325 MG tablet Take 650 mg by mouth every 6 (six) hours as needed for moderate pain.   amLODipine (NORVASC) 10 MG tablet TAKE 1 TABLET BY MOUTH ONCE A DAY.   Calcium Carbonate-Vitamin D (CALCIUM 600+D) 600-400 MG-UNIT tablet Take 1 tablet by mouth daily. (Patient not taking: Reported on 01/06/2022)   CALMOSEPTINE 0.44-20.6 % OINT APPLY TOPICALLY TO BUTTOCKS AND PERINEUM AFTER EACH INCONTINENCE EPISODE AND AFTER SHOWERS. (Patient not taking: Reported on 01/06/2022)   carbidopa-levodopa (SINEMET IR) 25-100 MG tablet Take 1 tablet by mouth 3 (three) times daily. (Patient not taking: Reported on 01/06/2022)   citalopram (CELEXA) 20 MG tablet TAKE 1_1/2 TABLETS ('30MG'$ ) BY MOUTH ONCE DAILY.   entacapone (COMTAN) 200 MG tablet TAKE (1) TABLET BY MOUTH (3) TIMES DAILY. (Patient not taking: Reported on 01/06/2022)   famotidine (PEPCID) 20 MG tablet TAKE (1) TABLET BY MOUTH TWICE DAILY.   GOODSENSE CLEARLAX 17 GM/SCOOP powder MIX 17G (1 CAPFUL) INTO 8 OUNCES OF JUICE/WATER. DRINK ONCE DAILY FOR CONSTIPATION.   KLOR-CON M20 20 MEQ tablet TAKE 3 TABLETS (60 MEQ TOTAL) BY MOUTH DAILY. (Patient not taking: Reported on 01/06/2022)   memantine (NAMENDA) 10 MG  tablet TAKE 1 TABLET (10 MG TOTAL) BY MOUTH 2 (TWO) TIMES DAILY. (Patient not taking: Reported on 01/06/2022)   oxyCODONE (OXY IR/ROXICODONE) 5 MG immediate release tablet Take 1 tablet (5 mg total) by mouth every 6 (six) hours as needed.   Pimavanserin Tartrate (NUPLAZID) 34 MG CAPS TAKE (1) CAPSULE BY MOUTH ONCE DAILY.   Polyethyl Glycol-Propyl Glycol (SYSTANE) 0.4-0.3 % SOLN INSTILL 1 DROP INTO EACH EYE 3 TIMES A DAY   polyethylene glycol (MIRALAX / GLYCOLAX) 17 g packet Take 17 g  by mouth daily.   QUEtiapine (SEROQUEL) 25 MG tablet 1 tablet at 2 pm (Patient not taking: Reported on 01/06/2022)   trimethoprim-polymyxin b (POLYTRIM) ophthalmic solution Place 2 drops into both eyes every 4 (four) hours. Placed 2 drops into both eyes every 4 hours while awake for 7 days (Patient not taking: Reported on 01/06/2022)   No facility-administered encounter medications on file as of 06/03/2022.    Allergies (verified) Nucynta [tapentadol], Pregabalin, and Exelon [rivastigmine]   History: Past Medical History:  Diagnosis Date   Abdominal pain, right lower quadrant    Abnormality of gait 05/16/2016   Acute prostatitis    Anxiety    Constipation    Degenerative disc disease    Depression    Diverticulosis of colon (without mention of hemorrhage)    Essential and other specified forms of tremor    Family history of malignant neoplasm of gastrointestinal tract    Fibromyalgia    Ganglion and cyst of synovium, tendon, and bursa    GERD (gastroesophageal reflux disease)    Hemarthrosis, upper arm    Hyperlipidemia    Hypersomnia with sleep apnea, unspecified    Hypertension    Hypopotassemia    Localized osteoarthrosis not specified whether primary or secondary, lower leg    Lumbago    Memory difficulties 04/22/2013   Nocturia    Osteoporosis, unspecified    Other testicular hypofunction    Parkinson disease 05/16/2016   Right inguinal hernia    Sleep apnea    last sleep study 11/11 on chart- Bipap with settings of 4 per  patient   Spinal stenosis, lumbar region, without neurogenic claudication    Syncope and collapse    Unspecified adverse effect of unspecified drug, medicinal and biological substance    Unspecified gastritis and gastroduodenitis without mention of hemorrhage    Urinary frequency    Past Surgical History:  Procedure Laterality Date   BACK SURGERY     x 3   cataract surgery Bilateral    [place]   07/19/2011   Procedure: LAPAROSCOPIC INGUINAL HERNIA;  Surgeon: Odis Hollingshead, MD;  Location: [facility];  Service: General;  Laterality: Right;  Laparoscopic Repair of Recurrent Right  Ingunial Hernia with Mesh   microdisectomy  03/02/2003, 06/29/2003, 12/24/2005   TONSILLECTOMY     Family History  Problem Relation Age of Onset   Colon cancer Mother    Dementia Mother    Parkinsonism Father 93   Lung cancer Paternal Grandfather        smoker   Liver disease Neg Hx    Kidney disease Neg Hx    Esophageal cancer Neg Hx    Social History   Socioeconomic History   Marital status: Widowed    Spouse name: Not on file   Number of children: 2   Years of education: college   Highest education level: Not on file  Occupational History  Occupation: retired    Fish farm manager: RETIRED  Tobacco Use   Smoking status: Never   Smokeless tobacco: Never  Vaping Use   Vaping Use: Never used  Substance and Sexual Activity   Alcohol use: No    Alcohol/week: 0.0 standard drinks of alcohol    Comment: less than 5 per week   Drug use: No   Sexual activity: Not on file  Other Topics Concern   Not on file  Social History Narrative   Lives at [place], [place] alone in an assisted living apartment    Patient is right handed.   Patient drinks 1 cup of caffeine per day.   Social Determinants of Health   Financial Resource Strain: Low Risk  (06/03/2022)   Overall Financial Resource Strain (CARDIA)    Difficulty of Paying Living Expenses: Not hard at all  Food Insecurity: No Food Insecurity (06/03/2022)   Hunger Vital Sign    Worried About Running Out of Food in the Last Year: Never true    Ran Out of Food in the Last Year: Never true  Transportation Needs: No Transportation Needs (06/03/2022)   PRAPARE - Hydrologist (Medical): No    Lack of Transportation (Non-Medical): No  Physical Activity: Inactive (06/22/2020)   Exercise Vital Sign    Days of Exercise per  Week: 0 days    Minutes of Exercise per Session: 0 min  Stress: No Stress Concern Present (06/03/2022)   [place]    Feeling of Stress : Not at all  Social Connections: Socially Isolated (06/03/2022)   Social Connection and Isolation Panel [NHANES]    Frequency of Communication with Friends and Family: More than three times a week    Frequency of Social Gatherings with Friends and Family: More than three times a week    Attends Religious Services: Never    Marine scientist or Organizations: No    Attends Archivist Meetings: Never    Marital Status: Widowed    Tobacco Counseling Counseling given: Not Answered   Clinical Intake:  Pre-visit preparation completed: No  Pain : No/denies pain     BMI - recorded: 20.97 Nutritional Status: BMI of 19-24  Normal Nutritional Risks: None Diabetes: No  How often do you need to have someone help you when you read instructions, pamphlets, or other written materials from your doctor or pharmacy?: 4 - Often (Son assist)  Diabetic?  No  Interpreter Needed?: No  Information entered by :: Rolene Arbour LPN   Activities of Daily Living    06/03/2022    9:54 AM 01/06/2022    7:44 PM  In your present state of health, do you have any difficulty performing the following activities:  Hearing? 0   Vision? 0   Difficulty concentrating or making decisions? 1   Comment Son Assist   Walking or climbing stairs? 1   Comment Uses wheelchair   Dressing or bathing? [place] staff assist   Doing errands, shopping? 1 1  Comment Son Diplomatic Services operational officer and eating ? [place] staff assist   Using the Toilet? [place] staff assist   In the past six months, have you accidently leaked urine? Y   Comment Incontient wears breifs   Do you have problems with loss of bowel control? Y   Comment Incontient wears breifs   Managing  your Medications? Y   Nutritional therapist your Finances? Y   Comment Son TEFL teacher or managing your Housekeeping? [place] staff assist     Patient Care Team: Eulas Post, MD as PCP - General (Family Medicine) Lorretta Harp, MD as PCP - Cardiology (Cardiology)  Indicate any recent Medical Services you may have received from other than [facility] providers in the past year (date may be approximate).     Assessment:   This is a routine wellness examination for [place].  Hearing/Vision screen Hearing Screening - Comments:: Denies hearing difficulties   Vision Screening - Comments:: Wears rx glasses - up to date with routine eye exams with  Dr Thomas Hoff  Dietary issues and exercise activities discussed: Current Exercise Habits: The patient does not participate in regular exercise at present, Exercise limited by: orthopedic condition(s)   Goals Addressed               This Visit's Progress     Stay Healthy (pt-stated)         Depression Screen    06/03/2022    9:53 AM 05/17/2021    2:42 PM 05/17/2021    2:41 PM 05/17/2021    2:40 PM 06/22/2020    2:51 PM 03/31/2018   10:20 AM 03/28/2017    9:47 AM  PHQ 2/9 Scores  PHQ - 2 Score 0 0 0 0 0 0 0  PHQ- 9 Score     0      Fall Risk    06/03/2022    9:58 AM 05/17/2021    2:41 PM 06/22/2020    2:50 PM 05/14/2018   10:24 AM 03/31/2018   10:20 AM  Fall Risk   Falls in the past year? 1 0 1 Yes Yes  Number falls in past yr: 1 0 1 2 or more   Injury with Fall? 1 0 0 No   Comment Fx rt hip followed by Orthopedic      Risk for fall due to : History of fall(s) Impaired balance/gait Impaired balance/gait;Medication side effect    Risk for fall due to: Comment  walker     Follow up Falls prevention discussed Falls evaluation completed;Education provided Falls evaluation completed;Falls prevention discussed      FALL RISK PREVENTION PERTAINING TO THE HOME:  Any stairs in or around the  home? Yes  If so, are there any without handrails? No  Home [place] of loose throw rugs in walkways, pet beds, electrical cords, etc? Yes  Adequate lighting in your home to reduce risk of falls? Yes   ASSISTIVE DEVICES UTILIZED TO PREVENT FALLS:  Life alert? No  Use of a cane, walker or w/c? Yes  Grab bars in the bathroom? Yes  Shower chair or bench in shower? Yes  Elevated toilet seat or a handicapped toilet? No   TIMED UP AND GO:  Was the test performed? No . Audio Visit   Cognitive Function:    01/25/2021    4:51 PM 12/14/2019   12:15 PM 09/15/2018   12:15 PM 05/14/2018   10:32 AM 01/08/2018    9:48 AM  MMSE - Mini Mental State Exam  Orientation to time '1 3 4 3 4  '$ Orientation to Place '5 5 5 5 5  '$ Registration '3 3 3 3 3  '$ Attention/ Calculation 0 '1 2 3 2  '$ Recall '3 3 2 2 2  '$ Language- name 2 objects 1 2 2  2 2  Language- repeat '1 1 1 1 1  '$ Language- follow 3 step command '1 3 3 3 3  '$ Language- read & follow direction '1 1 1 1 1  '$ Write a sentence 0 '1 1 1 1  '$ Copy design 0 0 0 0 1  Total score '16 23 24 24 25        '$ 06/03/2022   10:01 AM 06/22/2020    2:55 PM  6CIT Screen  What Year? 4 points 0 points  What month? 3 points 3 points  What time? 3 points 0 points  Count back from 20 4 points 4 points  Months in reverse 4 points 4 points  Repeat phrase 10 points 10 points  Total Score 28 points 21 points    Immunizations Immunization History  Administered Date(s) Administered   Influenza Whole 08/19/2005, 05/09/2008, 05/16/2009, 05/21/2010   Influenza, High Dose Seasonal PF 05/14/2016, 04/25/2017   Influenza,inj,Quad PF,6+ Mos 05/07/2013, 06/14/2014   Influenza-Unspecified 06/02/2015, 05/26/2020   Moderna Sars-Covid-2 Vaccination 09/04/2019, 10/02/2019   PPD Test 12/25/2017   Pneumococcal Conjugate-13 03/28/2017   Pneumococcal Polysaccharide-23 03/07/2014   Td 08/19/2001   Tdap 03/07/2014   Zoster, Live 09/24/2013    TDAP status: Up to date  Flu Vaccine status: Due,  Education has been provided regarding the importance of this vaccine. Advised may receive this vaccine at local pharmacy or Health Dept. Aware to provide a copy of the vaccination record if obtained from local pharmacy or Health Dept. Verbalized acceptance and understanding.  Pneumococcal vaccine status: Up to date  Covid-19 vaccine status: Completed vaccines  Qualifies for Shingles Vaccine? Yes   Zostavax completed No   Shingrix Completed?: No.    Education has been provided regarding the importance of this vaccine. Patient has been advised to call insurance company to determine out of pocket expense if they have not yet received this vaccine. Advised may also receive vaccine at local pharmacy or Health Dept. Verbalized acceptance and understanding.  Screening Tests Health Maintenance  Topic Date Due   COVID-19 Vaccine (3 - Moderna risk series) 06/19/2022 (Originally 10/30/2019)   Zoster Vaccines- Shingrix (1 of 2) 09/03/2022 (Originally 01/20/1965)   INFLUENZA VACCINE  11/17/2022 (Originally 03/19/2022)   TETANUS/TDAP  03/07/2024   Pneumonia Vaccine 61+ Years old  Completed   Hepatitis C Screening  Completed   HPV VACCINES  Aged Out   COLONOSCOPY (Pts 45-52yr Insurance coverage will need to be confirmed)  Discontinued    Health Maintenance  There are no preventive care reminders to display for this patient.   Colorectal cancer screening: No longer required.   Lung Cancer Screening: (Low Dose CT Chest recommended if Age 77-80years, 30 pack-year currently smoking OR have quit w/in 15years.) does not qualify.     Additional Screening:  Hepatitis C Screening: does qualify; Completed 09/11/17  Vision Screening: Recommended annual ophthalmology exams for early detection of glaucoma and other disorders of the eye. Is the patient up to date with their annual eye exam?  Yes  Who is the provider or what is the name of the office in which the patient attends annual eye exams? Dr HThomas HoffIf  pt is not established with a provider, would they like to be referred to a provider to establish care? No .   Dental Screening: Recommended annual dental exams for proper oral hygiene  Community Resource Referral / Chronic Care Management:  CRR required this visit?  No   CCM required this visit?  No  Plan:     I have personally reviewed and noted the following in the patient's chart:   Medical and social history Use of alcohol, tobacco or illicit drugs  Current medications and supplements including opioid prescriptions. Patient is currently taking opioid prescriptions. Information provided to patient regarding non-opioid alternatives. Patient advised to discuss non-opioid treatment plan with their provider. Functional ability and status Nutritional status Physical activity Advanced directives List of other physicians Hospitalizations, surgeries, and ER visits in previous 12 months Vitals Screenings to include cognitive, depression, and falls Referrals and appointments  In addition, I have reviewed and discussed with patient certain preventive protocols, quality metrics, and best practice recommendations. A written personalized care plan for preventive services as well as general preventive health recommendations were provided to patient.     Criselda Peaches, LPN   [id number]   Nurse Notes: None

## 2022-07-12 ENCOUNTER — Other Ambulatory Visit: Payer: Self-pay | Admitting: Family Medicine

## 2022-08-23 ENCOUNTER — Telehealth: Payer: Self-pay | Admitting: *Deleted

## 2022-08-23 ENCOUNTER — Encounter: Payer: Self-pay | Admitting: *Deleted

## 2022-08-23 NOTE — Patient Instructions (Signed)
Visit Information  Thank you for taking time to visit with me today. Please don't hesitate to contact me if I can be of assistance to you.   Following are the goals we discussed today:   Goals Addressed             This Visit's Progress    COMPLETED: care coordination activity       Care Coordination Interventions: Reviewed medications with patient and discussed adherence with no needed refills (managed by the facility) Reviewed scheduled/upcoming provider appointments including sufficient transportation source. Assessed social determinant of health barriers Educated on care management services with no needs presented at this time. Son indicated pt  has now been transferred to a memory unit form his level of care with ALF due to the need for alarm beds and locator devices. No future needs via care management dept with this nursing home placement at this time.         Please call the care guide team at [phone] if you need to cancel or reschedule your appointment.   If you are experiencing a Mental Health or [place] or need someone to talk to, please call the Suicide and Crisis Lifeline: 988  The patient verbalized understanding of instructions, educational materials, and care plan provided today and DECLINED offer to receive copy of patient instructions, educational materials, and care plan.   No further follow up required: No follow up needs  Raina Mina, RN Care Management Coordinator [place] Office [phone]

## 2022-08-23 NOTE — Patient Outreach (Signed)
  Care Coordination   Initial Visit Note   08/23/2022 Name: [name]. MRN: [id number] DOB: 1946-04-05  [name]. is a 77 y.o. year old male who sees Burchette, Alinda Sierras, MD for primary care. I spoke with  [name]. by phone today.  What matters to the patients health and wellness today?  No needs    Goals Addressed             This Visit's Progress    COMPLETED: care coordination activity       Care Coordination Interventions: Reviewed medications with patient and discussed adherence with no needed refills (managed by the facility) Reviewed scheduled/upcoming provider appointments including sufficient transportation source. Assessed social determinant of health barriers Educated on care management services with no needs presented at this time. Son indicated pt  has now been transferred to a memory unit form his level of care with ALF due to the need for alarm beds and locator devices. No future needs via care management dept with this nursing home placement at this time.         SDOH assessments and interventions completed:  Yes  SDOH Interventions Today    Flowsheet Row Most Recent Value  SDOH Interventions   Food Insecurity Interventions Intervention Not Indicated  Housing Interventions Intervention Not Indicated  Transportation Interventions Intervention Not Indicated  Utilities Interventions Intervention Not Indicated        Care Coordination Interventions:  Yes, provided   Follow up plan: No further intervention required.   Encounter Outcome:  Pt. Visit Completed   Raina Mina, RN Care Management Coordinator [place] Office [phone]

## 2022-11-26 ENCOUNTER — Other Ambulatory Visit: Payer: Self-pay | Admitting: Family Medicine

## 2022-11-28 ENCOUNTER — Other Ambulatory Visit: Payer: Self-pay | Admitting: Family Medicine

## 2023-01-21 ENCOUNTER — Telehealth: Payer: Self-pay | Admitting: Family Medicine

## 2023-01-21 NOTE — Telephone Encounter (Signed)
Checking on progress of faxes sent over by facility for provider attention.

## 2023-01-22 NOTE — Telephone Encounter (Signed)
I spoke with [name] and informed her we have not received fax. [name] is resending forms to our office via Fax

## 2023-03-20 DEATH — deceased
